# Patient Record
Sex: Female | Born: 1950 | Race: White | Hispanic: No | Smoking: Former smoker
Health system: Southern US, Community
[De-identification: ages and names within clinical notes are randomized; demographics above are authoritative.]

## PROBLEM LIST (undated history)

## (undated) DIAGNOSIS — R0600 Dyspnea, unspecified: Secondary | ICD-10-CM

## (undated) DIAGNOSIS — K219 Gastro-esophageal reflux disease without esophagitis: Secondary | ICD-10-CM

## (undated) DIAGNOSIS — E785 Hyperlipidemia, unspecified: Secondary | ICD-10-CM

## (undated) DIAGNOSIS — Z8619 Personal history of other infectious and parasitic diseases: Secondary | ICD-10-CM

## (undated) DIAGNOSIS — A630 Anogenital (venereal) warts: Secondary | ICD-10-CM

## (undated) DIAGNOSIS — E039 Hypothyroidism, unspecified: Secondary | ICD-10-CM

## (undated) DIAGNOSIS — F419 Anxiety disorder, unspecified: Secondary | ICD-10-CM

## (undated) DIAGNOSIS — R0609 Other forms of dyspnea: Secondary | ICD-10-CM

## (undated) DIAGNOSIS — Z87442 Personal history of urinary calculi: Secondary | ICD-10-CM

## (undated) DIAGNOSIS — J189 Pneumonia, unspecified organism: Secondary | ICD-10-CM

## (undated) DIAGNOSIS — F329 Major depressive disorder, single episode, unspecified: Secondary | ICD-10-CM

## (undated) DIAGNOSIS — F32A Depression, unspecified: Secondary | ICD-10-CM

## (undated) DIAGNOSIS — Z86010 Personal history of colonic polyps: Secondary | ICD-10-CM

## (undated) DIAGNOSIS — Z860101 Personal history of adenomatous and serrated colon polyps: Secondary | ICD-10-CM

## (undated) DIAGNOSIS — R911 Solitary pulmonary nodule: Secondary | ICD-10-CM

## (undated) DIAGNOSIS — R1310 Dysphagia, unspecified: Secondary | ICD-10-CM

## (undated) DIAGNOSIS — Z8659 Personal history of other mental and behavioral disorders: Secondary | ICD-10-CM

## (undated) DIAGNOSIS — M1990 Unspecified osteoarthritis, unspecified site: Secondary | ICD-10-CM

## (undated) DIAGNOSIS — J439 Emphysema, unspecified: Secondary | ICD-10-CM

## (undated) DIAGNOSIS — G4700 Insomnia, unspecified: Secondary | ICD-10-CM

## (undated) DIAGNOSIS — I10 Essential (primary) hypertension: Secondary | ICD-10-CM

## (undated) DIAGNOSIS — K449 Diaphragmatic hernia without obstruction or gangrene: Secondary | ICD-10-CM

## (undated) DIAGNOSIS — M5134 Other intervertebral disc degeneration, thoracic region: Secondary | ICD-10-CM

## (undated) DIAGNOSIS — Z9889 Other specified postprocedural states: Secondary | ICD-10-CM

## (undated) DIAGNOSIS — K7689 Other specified diseases of liver: Secondary | ICD-10-CM

## (undated) DIAGNOSIS — R112 Nausea with vomiting, unspecified: Secondary | ICD-10-CM

## (undated) DIAGNOSIS — F909 Attention-deficit hyperactivity disorder, unspecified type: Secondary | ICD-10-CM

## (undated) DIAGNOSIS — I480 Paroxysmal atrial fibrillation: Secondary | ICD-10-CM

## (undated) DIAGNOSIS — C801 Malignant (primary) neoplasm, unspecified: Secondary | ICD-10-CM

## (undated) DIAGNOSIS — D649 Anemia, unspecified: Secondary | ICD-10-CM

## (undated) DIAGNOSIS — D3500 Benign neoplasm of unspecified adrenal gland: Secondary | ICD-10-CM

## (undated) DIAGNOSIS — Z85118 Personal history of other malignant neoplasm of bronchus and lung: Secondary | ICD-10-CM

## (undated) HISTORY — DX: Anemia, unspecified: D64.9

## (undated) HISTORY — PX: COLONOSCOPY: SHX174

## (undated) HISTORY — DX: Dysphagia, unspecified: R13.10

## (undated) HISTORY — DX: Hyperlipidemia, unspecified: E78.5

---

## 1969-04-19 HISTORY — PX: TONSILLECTOMY: SUR1361

## 1976-04-19 HISTORY — PX: APPENDECTOMY: SHX54

## 1995-04-20 HISTORY — PX: CERVICAL FUSION: SHX112

## 1998-08-21 ENCOUNTER — Encounter: Admission: RE | Admit: 1998-08-21 | Discharge: 1998-08-21 | Payer: Self-pay | Admitting: Obstetrics & Gynecology

## 1999-11-19 ENCOUNTER — Other Ambulatory Visit: Admission: RE | Admit: 1999-11-19 | Discharge: 1999-11-19 | Payer: Self-pay | Admitting: Obstetrics

## 1999-11-30 ENCOUNTER — Encounter: Payer: Self-pay | Admitting: Urology

## 1999-11-30 ENCOUNTER — Encounter: Payer: Self-pay | Admitting: Obstetrics

## 1999-11-30 ENCOUNTER — Encounter: Admission: RE | Admit: 1999-11-30 | Discharge: 1999-11-30 | Payer: Self-pay | Admitting: Urology

## 1999-11-30 ENCOUNTER — Ambulatory Visit (HOSPITAL_COMMUNITY): Admission: RE | Admit: 1999-11-30 | Discharge: 1999-11-30 | Payer: Self-pay | Admitting: Obstetrics

## 2000-08-21 ENCOUNTER — Emergency Department (HOSPITAL_COMMUNITY): Admission: EM | Admit: 2000-08-21 | Discharge: 2000-08-21 | Payer: Self-pay | Admitting: Emergency Medicine

## 2000-08-21 ENCOUNTER — Encounter: Payer: Self-pay | Admitting: Emergency Medicine

## 2002-02-13 ENCOUNTER — Encounter: Payer: Self-pay | Admitting: Obstetrics

## 2002-02-13 ENCOUNTER — Ambulatory Visit (HOSPITAL_COMMUNITY): Admission: RE | Admit: 2002-02-13 | Discharge: 2002-02-13 | Payer: Self-pay | Admitting: Obstetrics

## 2002-03-23 ENCOUNTER — Encounter: Payer: Self-pay | Admitting: Cardiology

## 2002-03-23 ENCOUNTER — Ambulatory Visit (HOSPITAL_COMMUNITY): Admission: RE | Admit: 2002-03-23 | Discharge: 2002-03-23 | Payer: Self-pay | Admitting: Cardiology

## 2006-01-22 ENCOUNTER — Emergency Department (HOSPITAL_COMMUNITY): Admission: EM | Admit: 2006-01-22 | Discharge: 2006-01-22 | Payer: Self-pay | Admitting: Emergency Medicine

## 2006-09-09 ENCOUNTER — Encounter: Admission: RE | Admit: 2006-09-09 | Discharge: 2006-09-09 | Payer: Self-pay | Admitting: Family Medicine

## 2009-03-06 ENCOUNTER — Emergency Department (HOSPITAL_COMMUNITY): Admission: EM | Admit: 2009-03-06 | Discharge: 2009-03-06 | Payer: Self-pay | Admitting: Emergency Medicine

## 2009-04-04 ENCOUNTER — Emergency Department (HOSPITAL_COMMUNITY): Admission: EM | Admit: 2009-04-04 | Discharge: 2009-04-04 | Payer: Self-pay | Admitting: Emergency Medicine

## 2009-06-09 ENCOUNTER — Emergency Department (HOSPITAL_COMMUNITY): Admission: EM | Admit: 2009-06-09 | Discharge: 2009-06-09 | Payer: Self-pay | Admitting: Emergency Medicine

## 2011-04-14 ENCOUNTER — Emergency Department (HOSPITAL_COMMUNITY): Payer: Self-pay

## 2011-04-14 ENCOUNTER — Emergency Department (HOSPITAL_COMMUNITY)
Admission: EM | Admit: 2011-04-14 | Discharge: 2011-04-14 | Disposition: A | Discharge status: Home / Self Care | Payer: Self-pay | Attending: Emergency Medicine | Admitting: Emergency Medicine

## 2011-04-14 ENCOUNTER — Ambulatory Visit: Payer: Self-pay

## 2011-04-14 ENCOUNTER — Telehealth: Payer: Self-pay | Admitting: Pulmonary Disease

## 2011-04-14 ENCOUNTER — Encounter: Payer: Self-pay | Admitting: *Deleted

## 2011-04-14 DIAGNOSIS — R109 Unspecified abdominal pain: Secondary | ICD-10-CM | POA: Insufficient documentation

## 2011-04-14 DIAGNOSIS — D491 Neoplasm of unspecified behavior of respiratory system: Secondary | ICD-10-CM | POA: Insufficient documentation

## 2011-04-14 DIAGNOSIS — R1084 Generalized abdominal pain: Secondary | ICD-10-CM

## 2011-04-14 DIAGNOSIS — R10819 Abdominal tenderness, unspecified site: Secondary | ICD-10-CM | POA: Insufficient documentation

## 2011-04-14 DIAGNOSIS — R1110 Vomiting, unspecified: Secondary | ICD-10-CM | POA: Insufficient documentation

## 2011-04-14 DIAGNOSIS — R112 Nausea with vomiting, unspecified: Secondary | ICD-10-CM

## 2011-04-14 DIAGNOSIS — F17200 Nicotine dependence, unspecified, uncomplicated: Secondary | ICD-10-CM | POA: Insufficient documentation

## 2011-04-14 HISTORY — DX: Major depressive disorder, single episode, unspecified: F32.9

## 2011-04-14 HISTORY — DX: Depression, unspecified: F32.A

## 2011-04-14 HISTORY — DX: Insomnia, unspecified: G47.00

## 2011-04-14 LAB — CBC
HCT: 41.1 % (ref 36.0–46.0)
MCHC: 34.8 g/dL (ref 30.0–36.0)
MCV: 84.2 fL (ref 78.0–100.0)
Platelets: 315 10*3/uL (ref 150–400)
RDW: 14.2 % (ref 11.5–15.5)

## 2011-04-14 LAB — DIFFERENTIAL
Basophils Absolute: 0.1 10*3/uL (ref 0.0–0.1)
Basophils Relative: 1 % (ref 0–1)
Eosinophils Relative: 4 % (ref 0–5)
Monocytes Absolute: 0.5 10*3/uL (ref 0.1–1.0)
Neutro Abs: 5.9 10*3/uL (ref 1.7–7.7)

## 2011-04-14 LAB — COMPREHENSIVE METABOLIC PANEL
Albumin: 3.8 g/dL (ref 3.5–5.2)
Alkaline Phosphatase: 58 U/L (ref 39–117)
BUN: 14 mg/dL (ref 6–23)
CO2: 25 mEq/L (ref 19–32)
Chloride: 104 mEq/L (ref 96–112)
GFR calc non Af Amer: 89 mL/min — ABNORMAL LOW (ref >90)
Potassium: 4 mEq/L (ref 3.5–5.1)
Total Bilirubin: 0.3 mg/dL (ref 0.3–1.2)

## 2011-04-14 LAB — LIPASE, BLOOD: Lipase: 28 U/L (ref 11–59)

## 2011-04-14 MED ORDER — ONDANSETRON 8 MG PO TBDP: 8.0000 mg | ORAL_TABLET | Freq: Three times a day (TID) | ORAL | Status: AC | PRN

## 2011-04-14 MED ORDER — SODIUM CHLORIDE 0.9 % IV BOLUS (SEPSIS): 500.0000 mL | Freq: Once | INTRAVENOUS | Status: DC

## 2011-04-14 MED ORDER — ESOMEPRAZOLE MAGNESIUM 40 MG PO CPDR: 40.0000 mg | DELAYED_RELEASE_CAPSULE | Freq: Every day | ORAL | Status: DC

## 2011-04-14 MED ORDER — ONDANSETRON HCL 4 MG/2ML IJ SOLN
4.0000 mg | Freq: Once | INTRAMUSCULAR | Status: AC
  Administered 2011-04-14: 4 mg via INTRAVENOUS
  Filled 2011-04-14: qty 2

## 2011-04-14 MED ORDER — OXYCODONE-ACETAMINOPHEN 5-325 MG PO TABS: 1.0000 | ORAL_TABLET | ORAL | Status: AC | PRN

## 2011-04-14 MED ORDER — SODIUM CHLORIDE 0.9 % IV SOLN
INTRAVENOUS | Status: DC
  Administered 2011-04-14: 18:00:00 via INTRAVENOUS

## 2011-04-14 MED ORDER — IOHEXOL 300 MG/ML  SOLN
80.0000 mL | Freq: Once | INTRAMUSCULAR | Status: AC | PRN
  Administered 2011-04-14: 80 mL via INTRAVENOUS

## 2011-04-14 MED ORDER — OXYCODONE-ACETAMINOPHEN 5-325 MG PO TABS
1.0000 | ORAL_TABLET | Freq: Once | ORAL | Status: AC
  Administered 2011-04-14: 1 via ORAL
  Filled 2011-04-14: qty 1

## 2011-04-14 MED ORDER — FENTANYL CITRATE 0.05 MG/ML IJ SOLN
50.0000 ug | Freq: Once | INTRAMUSCULAR | Status: AC
  Administered 2011-04-14: 50 ug via INTRAVENOUS
  Filled 2011-04-14: qty 2

## 2011-04-14 NOTE — ED Notes (Signed)
Pt sent from urgent

## 2011-04-14 NOTE — Telephone Encounter (Signed)
60 yo female smoker seen in ED 12/26 for nausea and vomiting.  Had CT abd/pelvis with incidental finding of RLL 11 x 7 x 5 cm mass.  EDP felt patient did not need hospital admission, and requested that patient be contacted in AM of 12/27 to schedule pulmonary consultation with earliest available provider.  Patient was informed by EDP that she may have lung cancer.  Will forward message to triage to arrange for pulmonary consultation appointment.

## 2011-04-14 NOTE — ED Provider Notes (Signed)
History     CSN: [id number]  Arrival date & time 04/14/11  1553   First MD Initiated Contact with Patient 04/14/11 1628      Chief Complaint  Patient presents with  . Abdominal Pain    (Consider location/radiation/quality/duration/timing/severity/associated sxs/prior treatment) HPI Comments: She was seen in urgent care prior to coming here. CBC done. There is slightly elevated at 10.4. Urine dipstick was negative. They thought she might have a gallbladder problem, so sent her here for further evaluation.  Patient is a 60 y.o. female presenting with abdominal pain. The history is provided by the patient.  Abdominal Pain The primary symptoms of the illness include abdominal pain, nausea and vomiting (Started 5 days ago). The primary symptoms of the illness do not include fever, diarrhea or hematemesis. The current episode started more than 2 days ago. The onset of the illness was gradual (No vomiting since yesterday). The problem has been gradually improving.  The nausea is associated with eating.  The vomiting began more than 2 days ago. Vomiting occurs 6 to 10 times per day. Vomiting appearance: Thin, brown in color.  The patient states that she believes she is currently not pregnant. The patient has not had a change in bowel habit. Additional symptoms associated with the illness include back pain. Symptoms associated with the illness do not include chills, anorexia, urgency, hematuria or frequency. Associated symptoms comments: The emesis was the color of coffee. Significant associated medical issues do not include PUD, GERD, gallstones or liver disease. Associated medical issues comments: She has history of hiatal hernia.    Past Medical History  Diagnosis Date  . Insomnia   . Depression     Past Surgical History  Procedure Date  . Appendectomy   . Tonsillectomy   . Cervical fusion     No family history on file.  History  Substance Use Topics  . Smoking status: Current  Everyday Smoker -- 0.5 packs/day  . Smokeless tobacco: Not on file  . Alcohol Use: No    OB History    Grav Para Term Preterm Abortions TAB SAB Ect Mult Living                  Review of Systems  Constitutional: Negative for fever and chills.  Gastrointestinal: Positive for nausea, vomiting (Started 5 days ago) and abdominal pain. Negative for diarrhea, anorexia and hematemesis.  Genitourinary: Negative for urgency, frequency and hematuria.  Musculoskeletal: Positive for back pain.  All other systems reviewed and are negative.    Allergies  Sulfa antibiotics  Home Medications   Current Outpatient Rx  Name Route Sig Dispense Refill  . DULOXETINE HCL 30 MG PO CPEP Oral Take 60 mg by mouth daily.      Marland Kitchen ZOLPIDEM TARTRATE 10 MG PO TABS Oral Take 10 mg by mouth at bedtime as needed. For sleep.     Marland Kitchen ESOMEPRAZOLE MAGNESIUM 40 MG PO CPDR Oral Take 1 capsule (40 mg total) by mouth daily. 30 capsule 0  . ONDANSETRON 8 MG PO TBDP Oral Take 1 tablet (8 mg total) by mouth every 8 (eight) hours as needed for nausea. 20 tablet 0  . OXYCODONE-ACETAMINOPHEN 5-325 MG PO TABS Oral Take 1 tablet by mouth every 4 (four) hours as needed for pain. 30 tablet 0    BP 142/117  Pulse 100  Temp(Src) 98 F (36.7 C) (Oral)  Resp 20  Wt 162 lb (73.483 kg)  SpO2 100%  Physical Exam  Nursing note  and vitals reviewed. Constitutional: She is oriented to person, place, and time. She appears well-developed and well-nourished.  HENT:  Head: Normocephalic and atraumatic.  Eyes: Conjunctivae and EOM are normal. Pupils are equal, round, and reactive to light.  Neck: Normal range of motion and phonation normal. Neck supple.  Cardiovascular: Normal rate, regular rhythm and intact distal pulses.   Pulmonary/Chest: Effort normal and breath sounds normal. She exhibits no tenderness.  Abdominal: Soft. Bowel sounds are normal. She exhibits no distension and no mass. There is tenderness (she has mild, diffuse  abdominal tenderness). There is no rebound and no guarding.  Genitourinary:       She has left costovertebral angle tenderness  Musculoskeletal: Normal range of motion.       Shows moderate bilateral lumbar tenderness.  Neurological: She is alert and oriented to person, place, and time. She has normal strength and normal reflexes. She exhibits normal muscle tone.  Skin: Skin is warm and dry.  Psychiatric: Her behavior is normal. Judgment and thought content normal.       She is anxious    ED Course  Procedures (including critical care time) 19:33- EKG findings discussed with patient, after conferring with the radiologist, and pulmonary critical care doctors. The patient feels better after treatment in ED with IV fluids, antiemetics, and analgesia. She prefers to be discharged and have outpatient followup for the lung tumor. She understands that it is likely a cancer. Labs Reviewed  COMPREHENSIVE METABOLIC PANEL - Abnormal; Notable for the following:    GFR calc non Af Amer 89 (*)    All other components within normal limits  LIPASE, BLOOD  CBC  DIFFERENTIAL  URINE CULTURE   Ct Abdomen Pelvis W Contrast  04/14/2011  *RADIOLOGY REPORT*  Clinical Data: Mid abdominal pain, prior appendectomy  CT ABDOMEN AND PELVIS WITH CONTRAST  Technique:  Multidetector CT imaging of the abdomen and pelvis was performed following the standard protocol during bolus administration of intravenous contrast.  Contrast: 80mL OMNIPAQUE IOHEXOL 300 MG/ML IV SOLN  Comparison: None.  Findings: 11.3 x 7.7 x 5.3 cm heterogeneous, partially calcified right lower lobe pulmonary mass (series 2/image 1).  Small hiatal hernia.  Liver is notable for an 8 mm right lower pole cyst (series 2/image 26).  Spleen, pancreas, and right adrenal gland are within normal limits.  1.7 x 1.4 cm left adrenal nodule which measures 3 HUs on delayed imaging, compatible with an adrenal adenoma.  8 mm right upper pole cyst (series 2/image 26).  2.0  cm left renal cyst (series 2/image 32).  Mild left upper pole cortical scarring. No hydronephrosis.  No evidence of bowel obstruction.  Mild colonic diverticulosis, without associated inflammatory changes.  Atherosclerotic calcifications of the abdominal aorta and branch vessels.  No abdominopelvic ascites.  No suspicious abdominopelvic lymphadenopathy.  Uterus is mildly heterogeneous, possibly reflecting uterine fibroids.  Ovaries are unremarkable.  Bladder is within normal limits.  Mild degenerative changes of the visualized thoracolumbar spine.  IMPRESSION: 11.3 x 7.7 x 5.3 cm right lower lobe pulmonary mass.  1.7 cm left adrenal adenoma.  Additional ancillary findings as above.  Critical Value/emergent results were called by telephone at the time of interpretation on 04/14/2011  at 1905 hours  to  Dr. Mancel Bale, who verbally acknowledged these results.  Original Report Authenticated By: Charline Bills, M.D.     1. Abdominal  pain, other specified site   2. Vomiting   3. Lung tumor       MDM  Nonspecific abdominal pain with vomiting. 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 Patient and her sister were informed of the findings and understands implications. Relative to the abdominal pain and vomiting; there is no clear source found. She may have an element of peptic ulcer disease, and that can be treated symptomatically.   Plan: Prescriptions Percocet, Zofran, Nexium. Followup with pulmonary as soon as possible. Also, recommend followup with a primary care doctor.        Flint Melter, MD 04/14/11 [phone]

## 2011-04-14 NOTE — ED Notes (Signed)
Patient aware of need for urine specimen. Patient unable to void at this time.

## 2011-04-14 NOTE — ED Notes (Signed)
Pt states "seen @ UC & was sent here thinking it's my gallbladder, have been vomiting, have pain all through abd"

## 2011-04-14 NOTE — ED Notes (Signed)
Pt reports nausea since Sun am, worsening every day, vomited x 2 today. Denies fever. States UC did UA today and it was ok and told her to go to ER. No SOB, no diarrhea.

## 2011-04-15 NOTE — Telephone Encounter (Signed)
Called and spoke with pt and appt has been made for her on 1-15 with RB at 3pm.  Pt is aware of appt and will bring all meds and medical info with her to this appt.  Pt is aware of location of office and to arrive 15 mins prior to scheduled appt.  Pt may call to see if we have any earlier appts.

## 2011-04-20 DIAGNOSIS — C801 Malignant (primary) neoplasm, unspecified: Secondary | ICD-10-CM

## 2011-04-20 HISTORY — DX: Malignant (primary) neoplasm, unspecified: C80.1

## 2011-04-23 ENCOUNTER — Telehealth: Payer: Self-pay | Admitting: Internal Medicine

## 2011-04-23 NOTE — Telephone Encounter (Signed)
Sister calling back.  She states she really needs some advise about patient before the weekend.

## 2011-04-23 NOTE — Telephone Encounter (Signed)
Spoke with pt's sister. Pt scheduled to see RB for consult for lung mass on 05/04/11. She states that the pt was vomiting this am and wanted to know what she should do about this. I advised that she should contact PCP. She states that the pt does not have one. I advised that pt should increase fluids, bland diet and seek emergency care asap if she get worse or if the symptoms persist. She verbalized understanding and states nothing further needed.

## 2011-05-04 ENCOUNTER — Encounter: Payer: Self-pay | Admitting: Emergency Medicine

## 2011-05-04 ENCOUNTER — Ambulatory Visit (INDEPENDENT_AMBULATORY_CARE_PROVIDER_SITE_OTHER): Payer: Self-pay | Admitting: Emergency Medicine

## 2011-05-04 VITALS — BP 120/80 | HR 82 | Temp 98.2°F | Ht 66.0 in | Wt 160.0 lb

## 2011-05-04 DIAGNOSIS — R222 Localized swelling, mass and lump, trunk: Secondary | ICD-10-CM

## 2011-05-04 MED ORDER — OXYCODONE-ACETAMINOPHEN 5-325 MG PO TABS: 1.0000 | ORAL_TABLET | Freq: Four times a day (QID) | ORAL | Status: DC | PRN

## 2011-05-04 NOTE — Patient Instructions (Addendum)
We will perform a CT scan of the chest We will set up a bronchoscopy with biopsies Follow-up with Dr. Delton Coombes in 1 month.

## 2011-05-04 NOTE — Progress Notes (Signed)
Subjective:    Patient ID: Andrea Morgan, female    DOB: 01/15/1951, 60 y.o.   MRN: [id number]  HPI 60 yo smoker, hx allergies, was seen 12/26 in urgent care for abd pain and nausea/vomiting, pain when taking PO. CT scan abd was performed that showed an large mass that appears to be in RLL, ? With some heterogeneity and possibly some contrast uptake vs calcification. Was seen in ED by Dr Wert and informed about the CT findings. Referred here now for biopsy and further eval.    Review of Systems  Constitutional: Positive for fatigue. Negative for fever and unexpected weight change.  HENT: Positive for trouble swallowing. Negative for ear pain, nosebleeds, congestion, sore throat, rhinorrhea, sneezing, dental problem, postnasal drip and sinus pressure.   Eyes: Negative.  Negative for redness and itching.  Respiratory: Positive for shortness of breath. Negative for cough, chest tightness and wheezing.   Cardiovascular: Negative.  Negative for palpitations and leg swelling.  Gastrointestinal: Positive for abdominal pain. Negative for nausea and vomiting.  Genitourinary: Negative.  Negative for dysuria.  Musculoskeletal: Negative.  Negative for joint swelling.  Skin: Negative.  Negative for rash.  Neurological: Positive for headaches.  Hematological: Negative.  Does not bruise/bleed easily.  Psychiatric/Behavioral: Negative for dysphoric mood. The patient is nervous/anxious.    Past Medical History  Diagnosis Date  . Insomnia   . Depression   . Chronic headaches   . Allergic rhinitis   . Depression      Family History  Problem Relation Age of Onset  . Asthma Mother   . Asthma Maternal Grandmother   . Heart disease Mother   . Lung cancer Father   . Ovarian cancer Maternal Aunt   . Breast cancer Maternal Aunt     x2  . Prostate cancer Maternal Uncle   . Kidney cancer Sister      History   Social History  . Marital Status: Divorced    Spouse Name: N/A    Number of  Children: N/A  . Years of Education: N/A   Occupational History  . 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 Smoking status: Former Smoker -- 1.0 packs/day for 45 years    Types: Cigarettes    Quit date: 04/14/2011  . Smokeless tobacco: Not on file  . Alcohol Use: No  . Drug Use: No  . Sexually Active: Not on file   Other Topics Concern  . Not on file   Social History Narrative  . No narrative on file     Allergies  Allergen Reactions  . Sulfa Antibiotics Hives     Outpatient Prescriptions Prior to Visit  Medication Sig Dispense Refill  . DULoxetine (CYMBALTA) 30 MG capsule Take 60 mg by mouth daily.        . esomeprazole (NEXIUM) 40 MG capsule Take 1 capsule (40 mg total) by mouth daily.  30 capsule  0  . zolpidem (AMBIEN) 10 MG tablet Take 10 mg by mouth at bedtime as needed. For sleep.            Objective:   Physical Exam  Gen: Pleasant, well-nourished, in no distress,  normal affect  ENT: No lesions,  mouth clear,  oropharynx clear, no postnasal drip  Neck: No JVD, no TMG, no carotid bruits  Lungs: No use of accessory muscles, no dullness to percussion, clear without rales or rhonchi  Cardiovascular: RRR, heart sounds normal, no murmur or gallops, no peripheral edema    Musculoskeletal: No deformities, no cyanosis or clubbing  Neuro: alert, non focal  Skin: Warm, no lesions or rashes      Assessment & Plan:  Chest mass Large RLL mass on CT scan abdomen 12/26.  - CT scan chest asap - set up FOB with bx's next week, if I am unable to reach it then will set up Ct guided needle or surgical bx.      

## 2011-05-04 NOTE — Assessment & Plan Note (Signed)
Large RLL mass on CT scan abdomen 12/26.  - CT scan chest asap - set up FOB with bx's next week, if I am unable to reach it then will set up Ct guided needle or surgical bx.

## 2011-05-06 ENCOUNTER — Ambulatory Visit (INDEPENDENT_AMBULATORY_CARE_PROVIDER_SITE_OTHER)
Admission: RE | Admit: 2011-05-06 | Discharge: 2011-05-06 | Disposition: A | Discharge status: Home / Self Care | Payer: Self-pay | Source: Ambulatory Visit | Attending: Emergency Medicine | Admitting: Emergency Medicine

## 2011-05-06 ENCOUNTER — Encounter (HOSPITAL_COMMUNITY): Payer: Self-pay | Admitting: Pharmacy Technician

## 2011-05-06 ENCOUNTER — Telehealth: Payer: Self-pay | Admitting: Emergency Medicine

## 2011-05-06 DIAGNOSIS — R222 Localized swelling, mass and lump, trunk: Secondary | ICD-10-CM

## 2011-05-06 MED ORDER — IOHEXOL 300 MG/ML  SOLN
80.0000 mL | Freq: Once | INTRAMUSCULAR | Status: AC | PRN
  Administered 2011-05-06: 80 mL via INTRAVENOUS

## 2011-05-06 NOTE — Telephone Encounter (Signed)
LMOMTCB x 1 

## 2011-05-06 NOTE — Telephone Encounter (Signed)
Pt's sister [name] returned call. I advised her per megan that nurse will call her back in a few minutes. [phone]. Andrea Morgan

## 2011-05-06 NOTE — Telephone Encounter (Signed)
Called and spoke with pt's sister [name].  [name] states pt got home and read over the bronch brochure and was concerned because it didn't mention anything about doing a tissue sample of the mass in her chest.  Pt concerned that RB isn't going to do a tissue sample and then she will have to have more tests/procedures done.  [name] states she was with pt for her appt with RB and took notes and noted that RB did state he would take tissue sample during bronch.  [name] just wanted to call today to verify this and to help reassure patient.  Informed [name] that yes, RB will get tissue samples of mass  during her bronchoscopy.  [name] verbalized understanding and nothing further needed and would relay message to pt.

## 2011-05-10 ENCOUNTER — Encounter (HOSPITAL_COMMUNITY): Payer: Self-pay

## 2011-05-11 ENCOUNTER — Other Ambulatory Visit: Payer: Self-pay | Admitting: Emergency Medicine

## 2011-05-11 ENCOUNTER — Ambulatory Visit (HOSPITAL_COMMUNITY): Payer: Self-pay

## 2011-05-11 ENCOUNTER — Ambulatory Visit (HOSPITAL_COMMUNITY)
Admission: RE | Admit: 2011-05-11 | Discharge: 2011-05-11 | Disposition: A | Discharge status: Home / Self Care | Payer: Self-pay | Source: Ambulatory Visit | Attending: Emergency Medicine | Admitting: Emergency Medicine

## 2011-05-11 ENCOUNTER — Encounter (HOSPITAL_COMMUNITY)
Admission: RE | Disposition: A | Discharge status: Home / Self Care | Payer: Self-pay | Source: Ambulatory Visit | Attending: Emergency Medicine

## 2011-05-11 DIAGNOSIS — R222 Localized swelling, mass and lump, trunk: Secondary | ICD-10-CM | POA: Insufficient documentation

## 2011-05-11 DIAGNOSIS — Z87891 Personal history of nicotine dependence: Secondary | ICD-10-CM | POA: Insufficient documentation

## 2011-05-11 HISTORY — PX: VIDEO BRONCHOSCOPY: SHX5072

## 2011-05-11 LAB — CULTURE, BAL-QUANTITATIVE W GRAM STAIN
Colony Count: NO GROWTH
Culture: NO GROWTH

## 2011-05-11 SURGERY — BRONCHOSCOPY, WITH FLUOROSCOPY
Anesthesia: Moderate Sedation | Laterality: Bilateral

## 2011-05-11 MED ORDER — FENTANYL CITRATE 0.05 MG/ML IJ SOLN
INTRAMUSCULAR | Status: AC
  Filled 2011-05-11: qty 4

## 2011-05-11 MED ORDER — MIDAZOLAM HCL 10 MG/2ML IJ SOLN
INTRAMUSCULAR | Status: AC
  Filled 2011-05-11: qty 4

## 2011-05-11 MED ORDER — LIDOCAINE HCL 1 % IJ SOLN
6.0000 mL | Freq: Once | INTRAMUSCULAR | Status: AC
  Administered 2011-05-11: 6 mL via RESPIRATORY_TRACT

## 2011-05-11 MED ORDER — PHENYLEPHRINE HCL 0.25 % NA SOLN
1.0000 | Freq: Four times a day (QID) | NASAL | Status: DC | PRN
  Administered 2011-05-11: 1 via NASAL
  Filled 2011-05-11: qty 15

## 2011-05-11 MED ORDER — SODIUM CHLORIDE 0.9 % IV SOLN
INTRAVENOUS | Status: DC
  Administered 2011-05-11: 08:00:00 via INTRAVENOUS

## 2011-05-11 MED ORDER — FENTANYL CITRATE 0.05 MG/ML IJ SOLN
INTRAMUSCULAR | Status: DC | PRN
  Administered 2011-05-11: 50 ug via INTRAVENOUS
  Administered 2011-05-11 (×2): 25 ug via INTRAVENOUS

## 2011-05-11 MED ORDER — MIDAZOLAM HCL 10 MG/2ML IJ SOLN
INTRAMUSCULAR | Status: DC | PRN
  Administered 2011-05-11 (×2): 2 mg via INTRAVENOUS
  Administered 2011-05-11: 1 mg via INTRAVENOUS
  Administered 2011-05-11: 2 mg via INTRAVENOUS
  Administered 2011-05-11: 1 mg via INTRAVENOUS

## 2011-05-11 MED ORDER — LIDOCAINE HCL 2 % EX GEL
Freq: Once | CUTANEOUS | Status: AC
  Administered 2011-05-11: 07:00:00 via TOPICAL

## 2011-05-11 NOTE — Interval H&P Note (Signed)
Interval hx: No clinical changes or reason not to proceed. All VS and data reviewed. Plan: FOB with TBBx under fluoroscopy  Levy Pupa, MD, PhD 05/11/2011, 8:16 AM [facility] [phone] or if no answer [phone]

## 2011-05-11 NOTE — Progress Notes (Signed)
This note also relates to the following rows which could not be included: Temp - Cannot attach notes to unvalidated device data Tamponade x 2 mins for bleeding

## 2011-05-11 NOTE — H&P (View-Only) (Signed)
Subjective:    Patient ID: Andrea Morgan, female    DOB: 1950/10/12, 61 y.o.   MRN: [id number]  HPI 61 yo smoker, hx allergies, was seen 12/26 in urgent care for abd pain and nausea/vomiting, pain when taking PO. CT scan abd was performed that showed an large mass that appears to be in RLL, ? With some heterogeneity and possibly some contrast uptake vs calcification. Was seen in ED by Dr Sherene Sires and informed about the CT findings. Referred here now for biopsy and further eval.    Review of Systems  Constitutional: Positive for fatigue. Negative for fever and unexpected weight change.  HENT: Positive for trouble swallowing. Negative for ear pain, nosebleeds, congestion, sore throat, rhinorrhea, sneezing, dental problem, postnasal drip and sinus pressure.   Eyes: Negative.  Negative for redness and itching.  Respiratory: Positive for shortness of breath. Negative for cough, chest tightness and wheezing.   Cardiovascular: Negative.  Negative for palpitations and leg swelling.  Gastrointestinal: Positive for abdominal pain. Negative for nausea and vomiting.  Genitourinary: Negative.  Negative for dysuria.  Musculoskeletal: Negative.  Negative for joint swelling.  Skin: Negative.  Negative for rash.  Neurological: Positive for headaches.  Hematological: Negative.  Does not bruise/bleed easily.  Psychiatric/Behavioral: Negative for dysphoric mood. The patient is nervous/anxious.    Past Medical History  Diagnosis Date  . Insomnia   . Depression   . Chronic headaches   . Allergic rhinitis   . Depression      Family History  Problem Relation Age of Onset  . Asthma Mother   . Asthma Maternal Grandmother   . Heart disease Mother   . Lung cancer Father   . Ovarian cancer Maternal Aunt   . Breast cancer Maternal Aunt     x2  . Prostate cancer Maternal Uncle   . Kidney cancer Sister      History   Social History  . Marital Status: Divorced    Spouse Name: N/A    Number of  Children: N/A  . 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 Smoking status: Former Smoker -- 1.0 packs/day for 45 years    Types: Cigarettes    Quit date: 04/14/2011  . Smokeless tobacco: Not on file  . Alcohol Use: No  . Drug Use: No  . Sexually Active: Not on file   Other Topics Concern  . Not on file   Social History Narrative  . No narrative on file     Allergies  Allergen Reactions  . Sulfa Antibiotics Hives     Outpatient Prescriptions Prior to Visit  Medication Sig Dispense Refill  . DULoxetine (CYMBALTA) 30 MG capsule Take 60 mg by mouth daily.        Marland Kitchen esomeprazole (NEXIUM) 40 MG capsule Take 1 capsule (40 mg total) by mouth daily.  30 capsule  0  . zolpidem (AMBIEN) 10 MG tablet Take 10 mg by mouth at bedtime as needed. For sleep.            Objective:   Physical Exam  Gen: Pleasant, well-nourished, in no distress,  normal affect  ENT: No lesions,  mouth clear,  oropharynx clear, no postnasal drip  Neck: No JVD, no TMG, no carotid bruits  Lungs: No use of accessory muscles, no dullness to percussion, clear without rales or rhonchi  Cardiovascular: RRR, heart sounds normal, no murmur or gallops, no peripheral edema  Musculoskeletal: No deformities, no cyanosis or clubbing  Neuro: alert, non focal  Skin: Warm, no lesions or rashes      Assessment & Plan:  Chest mass Large RLL mass on CT scan abdomen 12/26.  - CT scan chest asap - set up FOB with bx's next week, if I am unable to reach it then will set up Ct guided needle or surgical bx.

## 2011-05-11 NOTE — Progress Notes (Signed)
This note also relates to the following rows which could not be included: Temp - Cannot attach notes to unvalidated device data Tamponade for bleeding x 2 mins

## 2011-05-11 NOTE — Progress Notes (Addendum)
Received report from radiology re: portable dg chest xray 1 view (post-procedure today).  Notified Dr. Delton Coombes of report.  Per DR. Byrum, pt can be d/c'ed to home.

## 2011-05-11 NOTE — Brief Op Note (Signed)
Video Bronchoscopy Procedure Note  Date of Operation: 05/11/2011  Pre-op Diagnosis: R basilar lung mass   Post-op Diagnosis: R basilar lung mass  Surgeon: Levy Pupa  Assistants: none  Anesthesia: conscious sedation, moderate sedation  Meds Given: fentanyl , versed 8mg  in divided doses, 1% lidocaine 25cc total  Operation: Flexible video fiberoptic bronchoscopy and biopsies.  Estimated Blood Loss: 15cc  Complications: none noted  Indications and History: Andrea Morgan is 61yo female, seen in consultation for a newly discovered R basilar lung mass that appears to involve both the RML and RLL.  The risks, benefits, complications, treatment options and expected outcomes were discussed with the patient.  The possibilities of pneumothorax, pneumonia, reaction to medication, pulmonary aspiration, perforation of a viscus, bleeding, failure to diagnose a condition and creating a complication requiring transfusion or operation were discussed with the patient who freely signed the consent.    Description of Procedure: The patient was seen in the Preoperative Area, was examined and was deemed appropriate to proceed.  The patient was taken to cardiopulmonary room at [facility], identified as Andrea Morgan and the procedure verified as Flexible Video Fiberoptic Bronchoscopy.  A Time Out was held and the above information confirmed.   Conscious sedation was initiated as indicated above. The video fiberoptic bronchoscope was introduced via the L nare and a general inspection was performed which showed normal cords, normal trachea, normal main carina. The R sided airways were inspected and showed normal RUL, BI, slightly narrowed RML and patent  RLL. No endobronchial lesions or abnormal secretions were seen. The L side was then inspected. The LLL, LUL and Lingular airways were normal.  Under fluoroscopic guidance, Wang needle bx were performed on the R basilar mass using both a RLL and RML approach.  Likewise, transbronchial bronchial brushings were take using the RLL approach. Then fluoroscopy was used to perform transbronchial forceps biopsies via the RLL and the RML. There was some initial moderate bleeding after the transbronchial biopsies that stopped quickly with tamponade. Finally endobronchial washings were performed in the RLL to be sent for cytology. The patient tolerated the procedure well. The bronchoscope was removed. There were no obvious complications. A post-procedural CXR is pending.   Samples: 1. Transbronchial Wang needle biopsies from RLL 2. Transbronchial Wang needle biopsies from RML 3. Transbronchial brushings from RLL 4. Transbronchial forceps biopsies from RLL 5. Transbronchial forceps biopsies from RML 6. BAL from RLL   Plans:  We will review the cytology, pathology and microbiology results with the patient when they become available. The patient will be called with this information. They have also asked that we contact his daughter [name] his HCPOA at [phone]. Outpatient followup will be with  Levy Pupa, MD, PhD 05/11/2011, 9:31 AM [facility] [phone] or if no answer [phone]

## 2011-05-12 ENCOUNTER — Encounter (HOSPITAL_COMMUNITY): Payer: Self-pay

## 2011-05-13 ENCOUNTER — Telehealth: Payer: Self-pay | Admitting: Emergency Medicine

## 2011-05-13 NOTE — Telephone Encounter (Signed)
Reviewed the path results with pt by phone. Both the RLL and RML biopsies were reported as negative - no evidence malignancy. I will review the films with [facility], consider alternative biopsy.

## 2011-05-14 NOTE — Telephone Encounter (Signed)
Dr byrum, pt wants to speak to you again (even though you spoke to her yesterday). She has a few more questions/ concerns re: what you told her. Call her home #. Andrea Morgan

## 2011-05-14 NOTE — Telephone Encounter (Signed)
Spoke to her today. I plan to review her case with [facility] to consider bx via another route. She will also need a GI referral. I will go ahead and order this.

## 2011-05-17 ENCOUNTER — Telehealth: Payer: Self-pay | Admitting: Emergency Medicine

## 2011-05-17 DIAGNOSIS — R222 Localized swelling, mass and lump, trunk: Secondary | ICD-10-CM

## 2011-05-17 NOTE — Telephone Encounter (Signed)
Misty Stanley from [employer] calling back for [facility] & asked to be reached tomorrow morning around 8:30 a.m.  Antionette Fairy

## 2011-05-17 NOTE — Telephone Encounter (Signed)
Spoke with pt's sister. She states has several concerns regarding the pt. She states that pt "had a meltdown" over the w/e where she would not speak to anyone and "just is not being her self"- she states that she is concerned that pt is going to become dehydrated b/c she is unable to swallow properly and is not drinking enough. She states that she prefers that the pt be referred to Dr. Loreta Ave for GI needs. She wants to have that pt admitted if and when she has her needle bx done to ensure that she stays stable. Please advise thanks!

## 2011-05-17 NOTE — Telephone Encounter (Signed)
Spoke to pt sister and was given an appt to see [email or web address] 05/19/11@1 :30pm

## 2011-05-17 NOTE — Telephone Encounter (Signed)
Pt's sister Kem Parkinson returned call from [facility] re: referral to GI. [phone]. Hazel Sams

## 2011-05-18 ENCOUNTER — Telehealth: Payer: Self-pay | Admitting: Emergency Medicine

## 2011-05-18 NOTE — Telephone Encounter (Signed)
Reviewed case with the patient's sister. I have discussed the CT scan with Dr. Edwyna Shell with thoracic surgery, he agrees that this could be responsible for the patient's dysphasia although not necessarily. He further agrees that the lesion needs to be resected. Per our discussion I will order pulmonary function testing, a PET scan and will refer the patient to [facility]. Arrangements have already been made for her to see Dr. Elnoria Howard with gastroenterology tomorrow.

## 2011-05-18 NOTE — Telephone Encounter (Signed)
Spoke to [facility], records sent there for dr hung to review for appt 05/19/11

## 2011-05-19 ENCOUNTER — Telehealth: Payer: Self-pay | Admitting: Emergency Medicine

## 2011-05-19 ENCOUNTER — Encounter (HOSPITAL_COMMUNITY): Payer: Self-pay | Admitting: Emergency Medicine

## 2011-05-19 NOTE — Telephone Encounter (Signed)
Spoke to fran she is also aware of pt's appt

## 2011-05-19 NOTE — Telephone Encounter (Signed)
Per [facility] she let Dr. Elnoria Howard know rb was not here to day but will be tomorrow. Please advise Dr. Delton Coombes, thanks

## 2011-05-20 NOTE — Telephone Encounter (Signed)
Spoke to Dr Elnoria Howard today

## 2011-05-21 ENCOUNTER — Encounter (HOSPITAL_COMMUNITY): Payer: Self-pay

## 2011-05-21 ENCOUNTER — Ambulatory Visit (HOSPITAL_COMMUNITY)
Admission: RE | Admit: 2011-05-21 | Discharge: 2011-05-21 | Disposition: A | Discharge status: Home / Self Care | Payer: Self-pay | Source: Ambulatory Visit | Attending: Gastroenterology | Admitting: Gastroenterology

## 2011-05-21 ENCOUNTER — Encounter (HOSPITAL_COMMUNITY)
Admission: RE | Disposition: A | Discharge status: Home / Self Care | Payer: Self-pay | Source: Ambulatory Visit | Attending: Gastroenterology

## 2011-05-21 DIAGNOSIS — K449 Diaphragmatic hernia without obstruction or gangrene: Secondary | ICD-10-CM | POA: Insufficient documentation

## 2011-05-21 DIAGNOSIS — R1310 Dysphagia, unspecified: Secondary | ICD-10-CM | POA: Insufficient documentation

## 2011-05-21 DIAGNOSIS — K209 Esophagitis, unspecified without bleeding: Secondary | ICD-10-CM | POA: Insufficient documentation

## 2011-05-21 DIAGNOSIS — R222 Localized swelling, mass and lump, trunk: Secondary | ICD-10-CM | POA: Insufficient documentation

## 2011-05-21 HISTORY — PX: ESOPHAGOGASTRODUODENOSCOPY: SHX5428

## 2011-05-21 SURGERY — EGD (ESOPHAGOGASTRODUODENOSCOPY)
Anesthesia: Moderate Sedation

## 2011-05-21 MED ORDER — DIPHENHYDRAMINE HCL 50 MG/ML IJ SOLN
INTRAMUSCULAR | Status: AC
  Filled 2011-05-21: qty 1

## 2011-05-21 MED ORDER — SODIUM CHLORIDE 0.9 % IV SOLN
Freq: Once | INTRAVENOUS | Status: AC
  Administered 2011-05-21: 11:00:00 via INTRAVENOUS

## 2011-05-21 MED ORDER — FENTANYL CITRATE 0.05 MG/ML IJ SOLN
INTRAMUSCULAR | Status: AC
  Filled 2011-05-21: qty 4

## 2011-05-21 MED ORDER — MIDAZOLAM HCL 10 MG/2ML IJ SOLN
INTRAMUSCULAR | Status: AC
  Filled 2011-05-21: qty 4

## 2011-05-21 MED ORDER — OMEPRAZOLE 40 MG PO CPDR: 40.0000 mg | DELAYED_RELEASE_CAPSULE | Freq: Two times a day (BID) | ORAL | Status: DC

## 2011-05-21 MED ORDER — FENTANYL NICU IV SYRINGE 50 MCG/ML
INJECTION | INTRAMUSCULAR | Status: DC | PRN
  Administered 2011-05-21 (×3): 25 ug via INTRAVENOUS

## 2011-05-21 MED ORDER — MIDAZOLAM HCL 10 MG/2ML IJ SOLN
INTRAMUSCULAR | Status: DC | PRN
  Administered 2011-05-21: 2 mg via INTRAVENOUS
  Administered 2011-05-21: 1 mg via INTRAVENOUS
  Administered 2011-05-21 (×2): 2 mg via INTRAVENOUS

## 2011-05-21 NOTE — H&P (Signed)
  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  As a result of her dysphagia she reports a 10 lbs weight loss.  She is currently undergoing evaluation of her RLL mass and it may be a pleural based lesion.  Because of her dysphagia a GI consultation was requested and she is now here to undergo an EGD.  Past Medical History  Diagnosis Date  . Insomnia   . Chronic headaches   . Allergic rhinitis   . Depression   . Depression     Past Surgical History  Procedure Date  . Appendectomy 1978  . Tonsillectomy 1971  . Cervical fusion 1997  . Video bronchoscopy 05/11/2011    Procedure: VIDEO BRONCHOSCOPY WITH FLUORO;  Surgeon: [name], MD;  Location: [facility];  Service: Cardiopulmonary;  Laterality: Bilateral;  . Cervical fusion     Family History  Problem Relation Age of Onset  . Asthma Mother   . Asthma Maternal Grandmother   . Heart disease Mother   . Lung cancer Father   . Ovarian cancer Maternal Aunt   . Breast cancer Maternal Aunt     x2  . Prostate cancer Maternal Uncle   . Kidney cancer Sister     Social History:  reports that she quit smoking about 5 weeks ago. Her smoking use included Cigarettes. She has a 45 pack-year smoking history. She does not have any smokeless tobacco history on file. She reports that she does not drink alcohol or use illicit drugs.  Allergies:  Allergies  Allergen Reactions  . Sulfa Antibiotics Hives    Medications:  Scheduled:   . sodium chloride   Intravenous Once   Continuous:   No results found for this or any previous visit (from the past 24 hour(s)).   No results found.  ROS:  As stated above in the HPI otherwise negative.  Blood pressure 106/76, pulse 68, temperature 98 F (36.7 C), temperature source Oral, resp. rate 17,  SpO2 96.00%.    PE: Gen: NAD, Alert and Oriented HEENT:  [place]/AT, EOMI Neck: Supple, no LAD Lungs: CTA Bilaterally CV: RRR without M/G/R ABM: Soft, NTND, +BS Ext: No C/C/E  Assessment/Plan: 1) Dysphagia 2) RLL mass   I discussed the case with Dr. Delton Coombes.  She may have a pleural-based lesion.  My feeling is that she may have extrinsic compression of the the esophagus from the mass, but further evaluation will be performed with an EGD.  Plan: 1) EGD today.  Curties Conigliaro D 05/21/2011, 11:21 AM

## 2011-05-21 NOTE — Op Note (Signed)
[facility] [address] [place], [place]  [address]  OPERATIVE PROCEDURE REPORT  PATIENT:  Andrea Morgan, Andrea Morgan  MR#:  [id number] BIRTHDATE:  05-May-1950  GENDER:  female ENDOSCOPIST:  Jeani Hawking, MD ASSISTANT:  Amie Critchley, Technician PROCEDURE DATE:  05/21/2011 PROCEDURE:  EGD, diagnostic [id number] ASA CLASS:  Class III INDICATIONS:  Dysphagia MEDICATIONS:  Fentanyl 75 mcg IV, Versed 7 mg IV TOPICAL ANESTHETIC:  Cetacaine Spray  DESCRIPTION OF PROCEDURE:   After the risks benefits and alternatives of the procedure were thoroughly explained, informed consent was obtained.  The EG-[id number] ([id number]) endoscope was introduced through the mouth and advanced to the second portion of the duodenum, without limitations.  The instrument was slowly withdrawn as the mucosa was fully examined. <<PROCEDUREIMAGES>>  FINDINGS:  An LA Grade D esophagitis was identified in the setting of a 6 cm hiatal hernia. No evidence of extrinsic compression or any other upper upper GI abnormality.    Retroflexion was not performed.  The scope was then withdrawn from the patient and the procedure terminated.  COMPLICATIONS:  None  IMPRESSION:  1) LA Grade D Esophagitis 2) 6 cm hiatal hernia RECOMMENDATIONS:  1) PPI BID x 1 month and then QD. 2) Continued work up and treatment of the RLL mass per [facility].  ______________________________ Jeani Hawking, MD  CPT CODES:  [id number]  DIAGNOSIS CODES:  530.19, 553.3, 787.20  CC:  [name], M.D.  n. Rosalie DoctorMarland Kitchen   Jeani Hawking at 05/21/2011 11:50 AM  Evans Lance, [phone]

## 2011-05-21 NOTE — Interval H&P Note (Signed)
History and Physical Interval Note:  05/21/2011 11:24 AM  Andrea Morgan  has presented today for surgery, with the diagnosis of dysphagia  The various methods of treatment have been discussed with the patient and family. After consideration of risks, benefits and other options for treatment, the patient has consented to  Procedure(s): ESOPHAGOGASTRODUODENOSCOPY (EGD) as a surgical intervention .  The patients' history has been reviewed, patient examined, no change in status, stable for surgery.  I have reviewed the patients' chart and labs.  Questions were answered to the patient's satisfaction.     Eithan Beagle D

## 2011-05-24 ENCOUNTER — Encounter (HOSPITAL_COMMUNITY): Payer: Self-pay | Admitting: Gastroenterology

## 2011-05-25 ENCOUNTER — Ambulatory Visit: Payer: Self-pay | Admitting: Gastroenterology

## 2011-05-26 ENCOUNTER — Ambulatory Visit (HOSPITAL_COMMUNITY)
Admission: RE | Admit: 2011-05-26 | Discharge: 2011-05-26 | Disposition: A | Discharge status: Home / Self Care | Payer: Self-pay | Source: Ambulatory Visit | Attending: Emergency Medicine | Admitting: Emergency Medicine

## 2011-05-26 ENCOUNTER — Encounter (HOSPITAL_COMMUNITY): Payer: Self-pay

## 2011-05-26 ENCOUNTER — Encounter: Payer: Self-pay | Admitting: Cardiothoracic Surgery

## 2011-05-26 ENCOUNTER — Institutional Professional Consult (permissible substitution) (INDEPENDENT_AMBULATORY_CARE_PROVIDER_SITE_OTHER): Payer: Self-pay | Admitting: Cardiothoracic Surgery

## 2011-05-26 ENCOUNTER — Encounter (HOSPITAL_COMMUNITY)
Admission: RE | Admit: 2011-05-26 | Discharge: 2011-05-26 | Disposition: A | Discharge status: Home / Self Care | Payer: Self-pay | Source: Ambulatory Visit | Attending: Emergency Medicine | Admitting: Emergency Medicine

## 2011-05-26 VITALS — BP 151/84 | HR 96 | Resp 20 | Ht 66.0 in | Wt 154.0 lb

## 2011-05-26 DIAGNOSIS — K7689 Other specified diseases of liver: Secondary | ICD-10-CM | POA: Insufficient documentation

## 2011-05-26 DIAGNOSIS — R222 Localized swelling, mass and lump, trunk: Secondary | ICD-10-CM | POA: Insufficient documentation

## 2011-05-26 DIAGNOSIS — R0989 Other specified symptoms and signs involving the circulatory and respiratory systems: Secondary | ICD-10-CM | POA: Insufficient documentation

## 2011-05-26 DIAGNOSIS — R05 Cough: Secondary | ICD-10-CM | POA: Insufficient documentation

## 2011-05-26 DIAGNOSIS — R918 Other nonspecific abnormal finding of lung field: Secondary | ICD-10-CM

## 2011-05-26 DIAGNOSIS — I2510 Atherosclerotic heart disease of native coronary artery without angina pectoris: Secondary | ICD-10-CM | POA: Insufficient documentation

## 2011-05-26 DIAGNOSIS — R0609 Other forms of dyspnea: Secondary | ICD-10-CM | POA: Insufficient documentation

## 2011-05-26 DIAGNOSIS — D3500 Benign neoplasm of unspecified adrenal gland: Secondary | ICD-10-CM | POA: Insufficient documentation

## 2011-05-26 DIAGNOSIS — J988 Other specified respiratory disorders: Secondary | ICD-10-CM | POA: Insufficient documentation

## 2011-05-26 DIAGNOSIS — R059 Cough, unspecified: Secondary | ICD-10-CM | POA: Insufficient documentation

## 2011-05-26 LAB — PULMONARY FUNCTION TEST

## 2011-05-26 LAB — GLUCOSE, CAPILLARY: Glucose-Capillary: 94 mg/dL (ref 70–99)

## 2011-05-26 MED ORDER — ALBUTEROL SULFATE (5 MG/ML) 0.5% IN NEBU
2.5000 mg | INHALATION_SOLUTION | Freq: Once | RESPIRATORY_TRACT | Status: AC
  Administered 2011-05-26: 2.5 mg via RESPIRATORY_TRACT

## 2011-05-26 MED ORDER — FLUDEOXYGLUCOSE F - 18 (FDG) INJECTION
19.0000 | Freq: Once | INTRAVENOUS | Status: AC | PRN
  Administered 2011-05-26: 19 via INTRAVENOUS

## 2011-05-26 NOTE — Progress Notes (Addendum)
Patient ID: Andrea Morgan, female   DOB: [birth year], 61 y.o.   MRN: [id number]                   [address]            [place] [address]          [phone]      Andrea Morgan [facility] Medical Record #[id number] Date of Birth: Sep 16, 1950  Referring: Andrea Morgan., MD Primary Care: Andrea Mulling, MD, MD  Chief Complaint:    Chief Complaint  Patient presents with  . Lung Mass    Referral from Dr Delton Coombes for surgical eval on Lung mass, PFT'S, PET Scan 05/26/11    History of Present Illness:    Patient is a 61 year old smoker who presented at the end of December, 2012 with chest discomfort and projectile vomiting. A CT scan was done while in the emergency room that showed a 10 cm mass involving right lower chest. The patient is referred today for consideration of resection. PET scan and PFTs were done today prior to her visit. Attempts at bronchoscopy were unsuccessful in obtaining a diagnosis. She's been a long-term smoker over 40 years but quit in December.      Current Activity/ Functional Status: Patient is independent with mobility/ambulation, transfers, ADL's, IADL's.   Past Medical History  Diagnosis Date  . Insomnia   . Chronic headaches   . Allergic rhinitis   . Depression   . Depression     Past Surgical History  Procedure Date  . Appendectomy 1978  . Tonsillectomy 1971  . Cervical fusion 1997  . Video bronchoscopy 05/11/2011    Procedure: VIDEO BRONCHOSCOPY WITH FLUORO;  Surgeon: Andrea Peer, MD;  Location: [facility];  Service: Cardiopulmonary;  Laterality: Bilateral;  . Cervical fusion   . Esophagogastroduodenoscopy 05/21/2011    Procedure: ESOPHAGOGASTRODUODENOSCOPY (EGD);  Surgeon: Andrea Belfast, MD;  Location: [facility];  Service: Endoscopy;  Laterality: N/A;    Family History  Problem Relation Age of Onset  . Asthma Mother   . Asthma Maternal Grandmother   . Heart disease Mother   . Lung cancer Father Died age  58 lung cancer  . Ovarian cancer Maternal Aunt   . Breast cancer Maternal Aunt     x2  . Prostate cancer Maternal Uncle   . Kidney cancer Sister Renal Cell 70    History   Social History  . Marital Status: Divorced    Spouse Name: N/A    Number of Children: N/A  . 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 Smoking status: Former Smoker -- 1.0 packs/day for 45 years    Types: Cigarettes    Quit date: 04/14/2011  . Smokeless tobacco: Not on file  . Alcohol Use: No  . Drug Use: No  . Sexually Active: Not on file   Other Topics Concern  . Not on file   Social History Narrative  . No narrative on file    History  Smoking status  . Former Smoker -- 1.0 packs/day for 45 years  . Types: Cigarettes  . Quit date: 04/14/2011  Smokeless tobacco  . Not on file    History  Alcohol Use No     Allergies  Allergen Reactions  . Sulfa Antibiotics Hives    Current Outpatient Prescriptions  Medication Sig Dispense Refill  . DULoxetine (  CYMBALTA) 30 MG capsule Take 30 mg by mouth 2 (two) times daily.       Marland Kitchen gabapentin (NEURONTIN) 300 MG capsule Take 300 mg by mouth 3 (three) times daily.      Marland Kitchen omeprazole (PRILOSEC) 40 MG capsule Take 40 mg by mouth daily.      . ondansetron (ZOFRAN) 8 MG tablet Take by mouth every 8 (eight) hours as needed. For nausea      . oxyCODONE-acetaminophen (PERCOCET) 5-325 MG per tablet Take 1 tablet by mouth every 6 (six) hours as needed. For pain      . zolpidem (AMBIEN) 10 MG tablet Take 10 mg by mouth at bedtime as needed.       No current facility-administered medications for this visit.   Facility-Administered Medications Ordered in Other Visits  Medication Dose Route Frequency Provider Last Rate Last Dose  . albuterol (PROVENTIL) (5 MG/ML) 0.5% nebulizer solution 2.5 mg  2.5 mg Nebulization Once Andrea Peer, MD   2.5 mg at 05/26/11 1204  . fludeoxyglucose F - 18  (FDG) injection 19 milli Curie  19 milli Curie Intravenous Once PRN Medication Radiologist, MD   19 milli Curie at 05/26/11 0920       Review of Systems:     Cardiac Review of Systems: Y or N  Chest Pain [   y ]  Resting SOB [  n ] Exertional SOB  [  y]  Orthopnea [  ]   Pedal Edema [  n ]    Palpitations [ n ] Syncope  [n  ]   Presyncope [ n  ]  General Review of Systems: [Y] = yes [  ]=no Constitional: recent weight change [y 13 lbs  ]; anorexia [  y]; fatigue [  ]; nausea [ y ]; night sweats [ y]; fever [n  ]; or chills [  ];                                                                                                                                          Dental: poor dentition[ y ]; .  Eye : blurred vision [  ]; diplopia [   ]; vision changes [  ];  Amaurosis fugax[  ]; Resp: cough [  ];  wheezing[ n ];  hemoptysis[  ]; shortness of breath[  ]; paroxysmal nocturnal dyspnea[  ]; dyspnea on exertion[  ]; or orthopnea[  ];  GI:  gallstones[  ], vomiting[  ];  dysphagia[ y ]; melena[ n ];  hematochezia [ n ]; heartburn[  ];   Hx of  Colonoscopy[  ]; GU: kidney stones [  ]; hematuria[  ];   dysuria [  ];  nocturia[  ];  history of     obstruction [  ];             Skin: rash, swelling[  ];,  hair loss[  ];  peripheral edema[  ];  or itching[  ]; Musculosketetal: myalgias[  ];  joint swelling[  ];  joint erythema[  ];  joint pain[  ];  back pain[  ];  Heme/Lymph: bruising[  ];  bleeding[  ];  anemia[  ];  Neuro: TIA[ n ];  headaches[  ];  stroke[ n ];  vertigo[  ];  seizures[  ];   paresthesias[  ];  difficulty walking[  ];  Psych:depression[  ]; anxiety[  ];  Endocrine: diabetes[  ];  thyroid dysfunction[  ];  Immunizations: Flu [ n ]; Pneumococcal[ n ];  Other:  Physical Exam: BP 151/84  Pulse 96  Resp 20  Ht 5\' 6"  (1.676 m)  Wt 154 lb (69.854 kg)  BMI 24.86 kg/m2  SpO2 97%  General appearance: alert, cooperative, appears older than stated age and no distress Neurologic:  intact Heart: regular rate and rhythm, S1, S2 normal, no murmur, click, rub or gallop Lungs: clear to auscultation bilaterally Abdomen: soft, non-tender; bowel sounds normal; no masses,  no organomegaly Extremities: extremities normal, atraumatic, no cyanosis or edema, Homans sign is negative, no sign of DVT and no edema, redness or tenderness in the calves or thighs There is no palpable cervical or supraclavicular or axillary adenopathy there is no inguinal  adenopathy   Diagnostic Studies & Laboratory data:     Recent Radiology Findings:   Nm Pet Image Initial (pi) Skull Base To Thigh  05/26/2011  *RADIOLOGY REPORT*  Clinical Data:  Initial treatment strategy for right lower lobe mass.  NUCLEAR MEDICINE PET CT INITIAL (PI) SKULL BASE TO THIGH  Technique:  19.0 mCi F-18 FDG was injected intravenously via the right antecubital fossa.  Full-ring PET imaging was performed from the skull base through the mid-thighs 64  minutes after injection. CT data was obtained and used for attenuation correction and anatomic localization only.  (This was not acquired as a diagnostic CT examination.)  Fasting Blood Glucose:  94  Patient Weight:  154 pounds.  Comparison:  CT chest from 05/06/2011  Findings: No evidence for unexpected radiotracer uptake in the neck.  The large right lower lobe pulmonary mass shows low level F D G accumulation.  Uptake is essentially at background soft tissue levels with SUV max = 3-4.  No other areas of unexpected or suspicious radiotracer uptake are seen in the chest.  Two areas of F D G uptake identified in the thoracic spine the more prominent of the two localizes to the region of the T4 superior endplate.  The more subtle of the two localizes to the region of the T1-2 interspace.  Review of the CT images shows no soft tissue lesion nor bony abnormality in either location.  There is no abnormal F D G accumulation in the abdomen.  The 17 mm left adrenal nodule has an average attenuation  of -12 Hounsfield units on today's study, consistent with benign adrenal adenoma. This nodule shows no F D G uptake above background levels.  No evidence for unexpected or suspicious radiotracer accumulation in the pelvis.  CT data obtained for attenuation correction reveals coronary artery calcification.  There is a tiny (7 mm) low density lesion in the inferior right liver this shows no evidence for hypermetabolism.  IMPRESSION: Large right lower lobe mass is mildly hypermetabolic with levels just above background soft tissue.  Well differentiated or low grade neoplasm remains within the differential consideration.  Two areas of hypermetabolism identified in the  upper thoracic spine.  No underlying CT abnormality is identified to account for this uptake.  Given that the activity appears to localize to the regions of the discs or endplates, it may well be degenerative. There is no destructive bony lesion in either location.  Thoracic MRI without and with contrast could be used to further evaluate, as warranted.  Benign left adrenal adenoma without evidence of hypermetabolism.  Original Report Authenticated By: ERIC A. MANSELL, M.D.      Recent Lab Findings: Lab Results  Component Value Date   WBC 9.2 04/14/2011   HGB 14.3 04/14/2011   HCT 41.1 04/14/2011   PLT 315 04/14/2011   GLUCOSE 99 04/14/2011   ALT 19 04/14/2011   AST 24 04/14/2011   NA 138 04/14/2011   K 4.0 04/14/2011   CL 104 04/14/2011   CREATININE 0.76 04/14/2011   BUN 14 04/14/2011   CO2 25 04/14/2011   PFTs were done and are adequate for resection   Assessment / Plan:     10 cm right chest mass, without significant PET uptake. The mass does not appear to involve the esophagus or invade into the pericardium. It does not appear to involve the cava. I reviewed the films with the patient and her sister and have recommended that we proceed with surgical resection. It is not clear that this is lung cancer and in fact may not be in  the lung. The risks and options were discussed with the patient. Her questions have been answered after being told that she has a large lung cancer since late December she is anxious to proceed with treatment. At this point I told her that proceeding with needle biopsy first but not change our course of action as the mass needs to be resected.  The goals risks and alternatives of the planned surgical procedure RT VATS and resection of Chest Mass have been discussed with the patient in detail. The risks of the procedure including death, infection, stroke, myocardial infarction, bleeding, blood transfusion have all been discussed specifically.  I have quoted Andrea Morgan a 4 % of perioperative mortality and a complication rate as high as 20%. The patient's questions have been answered.Andrea Morgan is willing  to proceed with the planned procedure.  The plan to proceed on February 13   Andrea Ovens MD  Beeper [phone] Office [phone] 05/26/2011 4:06 PM

## 2011-05-27 ENCOUNTER — Encounter (HOSPITAL_COMMUNITY): Payer: Self-pay | Admitting: Pharmacy Technician

## 2011-05-28 ENCOUNTER — Other Ambulatory Visit: Payer: Self-pay

## 2011-05-28 DIAGNOSIS — D381 Neoplasm of uncertain behavior of trachea, bronchus and lung: Secondary | ICD-10-CM

## 2011-05-31 ENCOUNTER — Other Ambulatory Visit (HOSPITAL_COMMUNITY): Payer: Self-pay | Admitting: *Deleted

## 2011-05-31 ENCOUNTER — Inpatient Hospital Stay (HOSPITAL_COMMUNITY): Admission: RE | Admit: 2011-05-31 | Discharge: 2011-05-31 | Payer: Self-pay | Source: Ambulatory Visit

## 2011-05-31 NOTE — Pre-Procedure Instructions (Signed)
20 Andrea Morgan  05/31/2011   Your procedure is scheduled on:06-02-2011 @ 10:50 AM   Report to [facility] at 7:30 AM.  Call this number if you have problems the morning of surgery: [phone]   Remember:   Do not eat food:After Midnight.  May have clear liquids: up to 4 Hours before arrival.  Clear liquids include soda, tea, black coffee, apple or grape juice, broth.until 3:30 AM  Take these medicines the morning of surgery with A SIP OF WATER:cymbalta,neurontin,omperazole,oxycodone if needed,   Do not wear jewelry, make-up or nail polish.  Do not wear lotions, powders, or perfumes. You may wear deodorant.  Do not shave 48 hours prior to surgery.  Do not bring valuables to the hospital.  Contacts, dentures or bridgework may not be worn into surgery.  Leave suitcase in the car. After surgery it may be brought to your room.  For patients admitted to the hospital, checkout time is 11:00 AM the day of discharge.      Special Instructions: CHG Shower Use Special Wash: 1/2 bottle night before surgery and 1/2 bottle morning of surgery.   Please read over the following fact sheets that you were given: Pain Booklet, Blood Transfusion Information, MRSA Information and Surgical Site Infection Prevention

## 2011-06-01 ENCOUNTER — Encounter (HOSPITAL_COMMUNITY)
Admission: RE | Admit: 2011-06-01 | Discharge: 2011-06-01 | Disposition: A | Discharge status: Home / Self Care | Payer: Self-pay | Source: Ambulatory Visit | Attending: Cardiothoracic Surgery | Admitting: Cardiothoracic Surgery

## 2011-06-01 ENCOUNTER — Other Ambulatory Visit: Payer: Self-pay

## 2011-06-01 ENCOUNTER — Encounter (HOSPITAL_COMMUNITY): Payer: Self-pay

## 2011-06-01 DIAGNOSIS — D381 Neoplasm of uncertain behavior of trachea, bronchus and lung: Secondary | ICD-10-CM

## 2011-06-01 HISTORY — DX: Nausea with vomiting, unspecified: R11.2

## 2011-06-01 HISTORY — DX: Gastro-esophageal reflux disease without esophagitis: K21.9

## 2011-06-01 HISTORY — DX: Anxiety disorder, unspecified: F41.9

## 2011-06-01 HISTORY — DX: Malignant (primary) neoplasm, unspecified: C80.1

## 2011-06-01 HISTORY — DX: Other specified postprocedural states: Z98.890

## 2011-06-01 LAB — URINALYSIS, ROUTINE W REFLEX MICROSCOPIC
Bilirubin Urine: NEGATIVE
Glucose, UA: NEGATIVE mg/dL
Hgb urine dipstick: NEGATIVE
Ketones, ur: NEGATIVE mg/dL
Leukocytes, UA: NEGATIVE
Nitrite: NEGATIVE
Protein, ur: NEGATIVE mg/dL
Specific Gravity, Urine: 1.026 (ref 1.005–1.030)
Urobilinogen, UA: 0.2 mg/dL (ref 0.0–1.0)
pH: 5.5 (ref 5.0–8.0)

## 2011-06-01 LAB — CBC
HCT: 36.2 % (ref 36.0–46.0)
Hemoglobin: 12.1 g/dL (ref 12.0–15.0)
MCH: 27.9 pg (ref 26.0–34.0)
MCHC: 33.4 g/dL (ref 30.0–36.0)
MCV: 83.4 fL (ref 78.0–100.0)
Platelets: 245 10*3/uL (ref 150–400)
RBC: 4.34 MIL/uL (ref 3.87–5.11)
RDW: 14.1 % (ref 11.5–15.5)
WBC: 5.8 10*3/uL (ref 4.0–10.5)

## 2011-06-01 LAB — COMPREHENSIVE METABOLIC PANEL
ALT: 15 U/L (ref 0–35)
AST: 19 U/L (ref 0–37)
Albumin: 3.6 g/dL (ref 3.5–5.2)
Alkaline Phosphatase: 55 U/L (ref 39–117)
BUN: 14 mg/dL (ref 6–23)
CO2: 21 mEq/L (ref 19–32)
Calcium: 9 mg/dL (ref 8.4–10.5)
Chloride: 106 mEq/L (ref 96–112)
Creatinine, Ser: 0.67 mg/dL (ref 0.50–1.10)
GFR calc Af Amer: >90 mL/min (ref >90)
GFR calc non Af Amer: >90 mL/min (ref >90)
Glucose, Bld: 98 mg/dL (ref 70–99)
Potassium: 3.7 mEq/L (ref 3.5–5.1)
Sodium: 140 mEq/L (ref 135–145)
Total Bilirubin: 0.3 mg/dL (ref 0.3–1.2)
Total Protein: 6.6 g/dL (ref 6.0–8.3)

## 2011-06-01 LAB — BLOOD GAS, ARTERIAL
Acid-Base Excess: 0.3 mmol/L (ref 0.0–2.0)
Bicarbonate: 24 mEq/L (ref 20.0–24.0)
Drawn by: 344381
FIO2: 0.21 %
O2 Saturation: 95.9 %
Patient temperature: 98.6
TCO2: 25.1 mmol/L (ref 0–100)
pCO2 arterial: 36 mmHg (ref 35.0–45.0)
pH, Arterial: 7.44 — ABNORMAL HIGH (ref 7.350–7.400)
pO2, Arterial: 74.6 mmHg — ABNORMAL LOW (ref 80.0–100.0)

## 2011-06-01 LAB — TYPE AND SCREEN
ABO/RH(D): A NEG
Antibody Screen: NEGATIVE

## 2011-06-01 LAB — SURGICAL PCR SCREEN
MRSA, PCR: NEGATIVE
Staphylococcus aureus: NEGATIVE

## 2011-06-01 LAB — ABO/RH: ABO/RH(D): A NEG

## 2011-06-01 LAB — PROTIME-INR
INR: 1.04 (ref 0.00–1.49)
Prothrombin Time: 13.8 seconds (ref 11.6–15.2)

## 2011-06-01 LAB — APTT: aPTT: 26 seconds (ref 24–37)

## 2011-06-01 MED ORDER — DEXTROSE 5 % IV SOLN
1.5000 g | INTRAVENOUS | Status: DC
  Filled 2011-06-01: qty 1.5

## 2011-06-01 MED ORDER — DEXTROSE 5 % IV SOLN
1.5000 g | INTRAVENOUS | Status: AC
  Administered 2011-06-02: 1.5 g via INTRAVENOUS
  Filled 2011-06-01: qty 1.5

## 2011-06-02 ENCOUNTER — Encounter (HOSPITAL_COMMUNITY): Payer: Self-pay | Admitting: *Deleted

## 2011-06-02 ENCOUNTER — Ambulatory Visit (HOSPITAL_COMMUNITY): Payer: Self-pay

## 2011-06-02 ENCOUNTER — Other Ambulatory Visit: Payer: Self-pay | Admitting: Cardiothoracic Surgery

## 2011-06-02 ENCOUNTER — Ambulatory Visit (HOSPITAL_COMMUNITY): Payer: Self-pay | Admitting: Anesthesiology

## 2011-06-02 ENCOUNTER — Encounter (HOSPITAL_COMMUNITY): Payer: Self-pay | Admitting: Anesthesiology

## 2011-06-02 ENCOUNTER — Inpatient Hospital Stay (HOSPITAL_COMMUNITY)
Admission: RE | Admit: 2011-06-02 | Discharge: 2011-06-05 | DRG: 165 | Disposition: A | Discharge status: Home / Self Care | Payer: MEDICAID | Source: Ambulatory Visit | Attending: Cardiothoracic Surgery | Admitting: Cardiothoracic Surgery

## 2011-06-02 ENCOUNTER — Encounter (HOSPITAL_COMMUNITY)
Admission: RE | Disposition: A | Discharge status: Home / Self Care | Payer: Self-pay | Source: Ambulatory Visit | Attending: Cardiothoracic Surgery

## 2011-06-02 DIAGNOSIS — Z882 Allergy status to sulfonamides status: Secondary | ICD-10-CM

## 2011-06-02 DIAGNOSIS — F329 Major depressive disorder, single episode, unspecified: Secondary | ICD-10-CM | POA: Diagnosis present

## 2011-06-02 DIAGNOSIS — J309 Allergic rhinitis, unspecified: Secondary | ICD-10-CM | POA: Diagnosis present

## 2011-06-02 DIAGNOSIS — Z01810 Encounter for preprocedural cardiovascular examination: Secondary | ICD-10-CM

## 2011-06-02 DIAGNOSIS — Z79899 Other long term (current) drug therapy: Secondary | ICD-10-CM

## 2011-06-02 DIAGNOSIS — Z01818 Encounter for other preprocedural examination: Secondary | ICD-10-CM

## 2011-06-02 DIAGNOSIS — Z87442 Personal history of urinary calculi: Secondary | ICD-10-CM

## 2011-06-02 DIAGNOSIS — R222 Localized swelling, mass and lump, trunk: Secondary | ICD-10-CM | POA: Diagnosis present

## 2011-06-02 DIAGNOSIS — F411 Generalized anxiety disorder: Secondary | ICD-10-CM | POA: Diagnosis present

## 2011-06-02 DIAGNOSIS — D381 Neoplasm of uncertain behavior of trachea, bronchus and lung: Secondary | ICD-10-CM

## 2011-06-02 DIAGNOSIS — K449 Diaphragmatic hernia without obstruction or gangrene: Secondary | ICD-10-CM | POA: Diagnosis present

## 2011-06-02 DIAGNOSIS — Z23 Encounter for immunization: Secondary | ICD-10-CM

## 2011-06-02 DIAGNOSIS — C761 Malignant neoplasm of thorax: Principal | ICD-10-CM | POA: Diagnosis present

## 2011-06-02 DIAGNOSIS — Z01812 Encounter for preprocedural laboratory examination: Secondary | ICD-10-CM

## 2011-06-02 DIAGNOSIS — Z87891 Personal history of nicotine dependence: Secondary | ICD-10-CM

## 2011-06-02 DIAGNOSIS — K219 Gastro-esophageal reflux disease without esophagitis: Secondary | ICD-10-CM | POA: Diagnosis present

## 2011-06-02 DIAGNOSIS — E876 Hypokalemia: Secondary | ICD-10-CM | POA: Diagnosis not present

## 2011-06-02 DIAGNOSIS — Z981 Arthrodesis status: Secondary | ICD-10-CM

## 2011-06-02 DIAGNOSIS — F3289 Other specified depressive episodes: Secondary | ICD-10-CM | POA: Diagnosis present

## 2011-06-02 HISTORY — PX: MASS EXCISION: SHX2000

## 2011-06-02 HISTORY — DX: Personal history of urinary calculi: Z87.442

## 2011-06-02 HISTORY — PX: VIDEO BRONCHOSCOPY: SHX5072

## 2011-06-02 SURGERY — BRONCHOSCOPY, VIDEO-ASSISTED
Anesthesia: General | Site: Chest | Wound class: Clean Contaminated

## 2011-06-02 MED ORDER — MIDAZOLAM HCL 5 MG/5ML IJ SOLN
INTRAMUSCULAR | Status: DC | PRN
  Administered 2011-06-02: 2 mg via INTRAVENOUS

## 2011-06-02 MED ORDER — ARTIFICIAL TEARS OP OINT
TOPICAL_OINTMENT | OPHTHALMIC | Status: DC | PRN
  Administered 2011-06-02: 1 via OPHTHALMIC

## 2011-06-02 MED ORDER — HETASTARCH-ELECTROLYTES 6 % IV SOLN
INTRAVENOUS | Status: DC | PRN
  Administered 2011-06-02: 11:00:00 via INTRAVENOUS

## 2011-06-02 MED ORDER — DEXTROSE 5 % IV SOLN
1.5000 g | Freq: Two times a day (BID) | INTRAVENOUS | Status: AC
  Filled 2011-06-02 (×2): qty 1.5

## 2011-06-02 MED ORDER — BISACODYL 5 MG PO TBEC
10.0000 mg | DELAYED_RELEASE_TABLET | Freq: Every day | ORAL | Status: DC
  Administered 2011-06-03 – 2011-06-05 (×3): 10 mg via ORAL
  Filled 2011-06-02 (×3): qty 2

## 2011-06-02 MED ORDER — SODIUM CHLORIDE 0.9 % IJ SOLN: 9.0000 mL | INTRAMUSCULAR | Status: DC | PRN

## 2011-06-02 MED ORDER — DIPHENHYDRAMINE HCL 12.5 MG/5ML PO ELIX
12.5000 mg | ORAL_SOLUTION | Freq: Four times a day (QID) | ORAL | Status: DC | PRN
  Filled 2011-06-02: qty 5

## 2011-06-02 MED ORDER — LACTATED RINGERS IV SOLN
INTRAVENOUS | Status: DC | PRN
  Administered 2011-06-02: 08:00:00 via INTRAVENOUS

## 2011-06-02 MED ORDER — ZOLPIDEM TARTRATE 5 MG PO TABS
10.0000 mg | ORAL_TABLET | Freq: Every evening | ORAL | Status: DC | PRN
  Administered 2011-06-03 – 2011-06-04 (×2): 10 mg via ORAL
  Filled 2011-06-02: qty 1
  Filled 2011-06-02: qty 2
  Filled 2011-06-02: qty 1
  Filled 2011-06-02: qty 2

## 2011-06-02 MED ORDER — FENTANYL 10 MCG/ML IV SOLN
INTRAVENOUS | Status: DC
  Administered 2011-06-02: 10 ug via INTRAVENOUS
  Administered 2011-06-02: 170 ug via INTRAVENOUS
  Administered 2011-06-02: 130 ug via INTRAVENOUS
  Administered 2011-06-03: 90 ug via INTRAVENOUS
  Administered 2011-06-03: 180 ug via INTRAVENOUS
  Administered 2011-06-03: 100 ug via INTRAVENOUS
  Administered 2011-06-03: 01:00:00 via INTRAVENOUS
  Administered 2011-06-03: 200 ug via INTRAVENOUS
  Administered 2011-06-04: 150 ug via INTRAVENOUS
  Administered 2011-06-04: 60 ug via INTRAVENOUS
  Administered 2011-06-04: 30 ug via INTRAVENOUS
  Filled 2011-06-02 (×3): qty 50

## 2011-06-02 MED ORDER — NALOXONE HCL 0.4 MG/ML IJ SOLN: 0.4000 mg | INTRAMUSCULAR | Status: DC | PRN

## 2011-06-02 MED ORDER — POTASSIUM CHLORIDE IN NACL 20-0.9 MEQ/L-% IV SOLN
INTRAVENOUS | Status: DC
  Administered 2011-06-02 – 2011-06-03 (×2): via INTRAVENOUS
  Filled 2011-06-02 (×5): qty 1000

## 2011-06-02 MED ORDER — GABAPENTIN 300 MG PO CAPS
300.0000 mg | ORAL_CAPSULE | Freq: Three times a day (TID) | ORAL | Status: DC
  Administered 2011-06-02 – 2011-06-05 (×8): 300 mg via ORAL
  Filled 2011-06-02 (×10): qty 1

## 2011-06-02 MED ORDER — DULOXETINE HCL 60 MG PO CPEP
60.0000 mg | ORAL_CAPSULE | Freq: Every morning | ORAL | Status: DC
  Administered 2011-06-03 – 2011-06-05 (×3): 60 mg via ORAL
  Filled 2011-06-02 (×3): qty 1

## 2011-06-02 MED ORDER — INFLUENZA VIRUS VACC SPLIT PF IM SUSP
0.5000 mL | INTRAMUSCULAR | Status: AC
Start: 2011-06-03 — End: 2011-06-04
  Filled 2011-06-02: qty 0.5

## 2011-06-02 MED ORDER — PNEUMOCOCCAL VAC POLYVALENT 25 MCG/0.5ML IJ INJ
0.5000 mL | INJECTION | INTRAMUSCULAR | Status: AC
  Filled 2011-06-02: qty 0.5

## 2011-06-02 MED ORDER — DEXAMETHASONE SODIUM PHOSPHATE 4 MG/ML IJ SOLN
INTRAMUSCULAR | Status: DC | PRN
  Administered 2011-06-02: 4 mg via INTRAVENOUS

## 2011-06-02 MED ORDER — FENTANYL 10 MCG/ML IV SOLN
INTRAVENOUS | Status: AC
  Filled 2011-06-02: qty 50

## 2011-06-02 MED ORDER — GLYCOPYRROLATE 0.2 MG/ML IJ SOLN
INTRAMUSCULAR | Status: DC | PRN
  Administered 2011-06-02: .6 mg via INTRAVENOUS

## 2011-06-02 MED ORDER — TRAMADOL HCL 50 MG PO TABS: 50.0000 mg | ORAL_TABLET | Freq: Four times a day (QID) | ORAL | Status: DC | PRN

## 2011-06-02 MED ORDER — ONDANSETRON HCL 4 MG/2ML IJ SOLN: 4.0000 mg | Freq: Four times a day (QID) | INTRAMUSCULAR | Status: DC | PRN

## 2011-06-02 MED ORDER — NEOSTIGMINE METHYLSULFATE 1 MG/ML IJ SOLN
INTRAMUSCULAR | Status: DC | PRN
  Administered 2011-06-02: 4 mg via INTRAVENOUS

## 2011-06-02 MED ORDER — PROPOFOL 10 MG/ML IV EMUL
INTRAVENOUS | Status: DC | PRN
  Administered 2011-06-02: 20 mg via INTRAVENOUS
  Administered 2011-06-02: 180 mg via INTRAVENOUS

## 2011-06-02 MED ORDER — BUPIVACAINE 0.5 % ON-Q PUMP SINGLE CATH 400 ML
400.0000 mL | INJECTION | Status: AC
  Administered 2011-06-02: 400 mL
  Filled 2011-06-02: qty 400

## 2011-06-02 MED ORDER — ONDANSETRON HCL 4 MG/2ML IJ SOLN
INTRAMUSCULAR | Status: DC | PRN
  Administered 2011-06-02: 4 mg via INTRAVENOUS

## 2011-06-02 MED ORDER — VECURONIUM BROMIDE 10 MG IV SOLR
INTRAVENOUS | Status: DC | PRN
  Administered 2011-06-02: 5 mg via INTRAVENOUS

## 2011-06-02 MED ORDER — OXYCODONE HCL 5 MG PO TABS
5.0000 mg | ORAL_TABLET | ORAL | Status: AC | PRN
  Administered 2011-06-02: 5 mg via ORAL
  Filled 2011-06-02: qty 1

## 2011-06-02 MED ORDER — OXYCODONE-ACETAMINOPHEN 5-325 MG PO TABS
1.0000 | ORAL_TABLET | ORAL | Status: DC | PRN
  Administered 2011-06-04: 2 via ORAL
  Administered 2011-06-05: 1 via ORAL
  Filled 2011-06-02: qty 1
  Filled 2011-06-02: qty 2

## 2011-06-02 MED ORDER — ACETAMINOPHEN 10 MG/ML IV SOLN
1000.0000 mg | Freq: Four times a day (QID) | INTRAVENOUS | Status: AC
  Administered 2011-06-02 – 2011-06-03 (×3): 1000 mg via INTRAVENOUS
  Filled 2011-06-02 (×3): qty 100

## 2011-06-02 MED ORDER — SENNOSIDES-DOCUSATE SODIUM 8.6-50 MG PO TABS
1.0000 | ORAL_TABLET | Freq: Every evening | ORAL | Status: DC | PRN
  Filled 2011-06-02: qty 1

## 2011-06-02 MED ORDER — POTASSIUM CHLORIDE 10 MEQ/50ML IV SOLN: 10.0000 meq | Freq: Every day | INTRAVENOUS | Status: DC | PRN

## 2011-06-02 MED ORDER — ROCURONIUM BROMIDE 100 MG/10ML IV SOLN
INTRAVENOUS | Status: DC | PRN
  Administered 2011-06-02: 50 mg via INTRAVENOUS

## 2011-06-02 MED ORDER — DIPHENHYDRAMINE HCL 50 MG/ML IJ SOLN: 12.5000 mg | Freq: Four times a day (QID) | INTRAMUSCULAR | Status: DC | PRN

## 2011-06-02 MED ORDER — FENTANYL CITRATE 0.05 MG/ML IJ SOLN
INTRAMUSCULAR | Status: DC | PRN
  Administered 2011-06-02: 100 ug via INTRAVENOUS
  Administered 2011-06-02 (×3): 50 ug via INTRAVENOUS
  Administered 2011-06-02: 100 ug via INTRAVENOUS
  Administered 2011-06-02: 50 ug via INTRAVENOUS

## 2011-06-02 MED ORDER — 0.9 % SODIUM CHLORIDE (POUR BTL) OPTIME
TOPICAL | Status: DC | PRN
  Administered 2011-06-02: 200 mL

## 2011-06-02 SURGICAL SUPPLY — 79 items
ADH SKN CLS APL DERMABOND .7 (GAUZE/BANDAGES/DRESSINGS) ×3
APL SRG 22X2 LUM MLBL SLNT (VASCULAR PRODUCTS)
APL SRG 7X2 LUM MLBL SLNT (VASCULAR PRODUCTS)
APPLICATOR TIP COSEAL (VASCULAR PRODUCTS) IMPLANT
APPLICATOR TIP EXT COSEAL (VASCULAR PRODUCTS) IMPLANT
BRUSH CYTOL CELLEBRITY 1.5X140 (MISCELLANEOUS) IMPLANT
CANISTER SUCTION 2500CC (MISCELLANEOUS) ×8 IMPLANT
CATH KIT ON Q 5IN SLV (PAIN MANAGEMENT) ×6 IMPLANT
CATH THORACIC 28FR (CATHETERS) IMPLANT
CATH THORACIC 36FR (CATHETERS) IMPLANT
CATH THORACIC 36FR RT ANG (CATHETERS) IMPLANT
CLIP TI MEDIUM 6 (CLIP) ×4 IMPLANT
CLOTH BEACON ORANGE TIMEOUT ST (SAFETY) ×8 IMPLANT
CONT SPEC 4OZ CLIKSEAL STRL BL (MISCELLANEOUS) ×16 IMPLANT
COVER SURGICAL LIGHT HANDLE (MISCELLANEOUS) ×8 IMPLANT
COVER TABLE BACK 60X90 (DRAPES) ×4 IMPLANT
DERMABOND ADVANCED (GAUZE/BANDAGES/DRESSINGS) ×1
DERMABOND ADVANCED .7 DNX12 (GAUZE/BANDAGES/DRESSINGS) ×1 IMPLANT
DRAPE LAPAROSCOPIC ABDOMINAL (DRAPES) ×8 IMPLANT
DRAPE SLUSH MACHINE 52X66 (DRAPES) ×2 IMPLANT
DRAPE SLUSH/WARMER DISC (DRAPES) IMPLANT
DRILL BIT 7/64X5 (BIT) ×4 IMPLANT
ELECT REM PT RETURN 9FT ADLT (ELECTROSURGICAL) ×8
ELECTRODE REM PT RTRN 9FT ADLT (ELECTROSURGICAL) ×6 IMPLANT
FORCEPS BIOP RJ4 1.8 (CUTTING FORCEPS) IMPLANT
GLOVE BIO SURGEON STRL SZ 6.5 (GLOVE) ×16 IMPLANT
GOWN STRL NON-REIN LRG LVL3 (GOWN DISPOSABLE) ×24 IMPLANT
HANDLE STAPLE ENDO GIA SHORT (STAPLE) ×1
HEMOSTAT SURGICEL 2X14 (HEMOSTASIS) IMPLANT
KIT BASIN OR (CUSTOM PROCEDURE TRAY) ×8 IMPLANT
KIT ROOM TURNOVER OR (KITS) ×8 IMPLANT
KIT SUCTION CATH 14FR (SUCTIONS) ×4 IMPLANT
MARKER SKIN DUAL TIP RULER LAB (MISCELLANEOUS) ×4 IMPLANT
NDL BIOPSY TRANSBRONCH 21G (NEEDLE) IMPLANT
NEEDLE BIOPSY TRANSBRONCH 21G (NEEDLE) IMPLANT
NS IRRIG 1000ML POUR BTL (IV SOLUTION) ×16 IMPLANT
OIL SILICONE PENTAX (PARTS (SERVICE/REPAIRS)) ×4 IMPLANT
PACK CHEST (CUSTOM PROCEDURE TRAY) ×8 IMPLANT
PAD ARMBOARD 7.5X6 YLW CONV (MISCELLANEOUS) ×16 IMPLANT
POUCH ENDO CATCH II 15MM (MISCELLANEOUS) ×2 IMPLANT
RELOAD GIA 30 2.0 ROTI (ENDOMECHANICALS) ×6 IMPLANT
SEALANT PROGEL (MISCELLANEOUS) IMPLANT
SEALANT SURG COSEAL 4ML (VASCULAR PRODUCTS) IMPLANT
SEALANT SURG COSEAL 8ML (VASCULAR PRODUCTS) IMPLANT
SOLUTION ANTI FOG 6CC (MISCELLANEOUS) ×6 IMPLANT
SPONGE GAUZE 4X4 12PLY (GAUZE/BANDAGES/DRESSINGS) ×8 IMPLANT
STAPLER ENDO GIA 12 SHRT THIN (STAPLE) IMPLANT
STAPLER ENDO GIA 12MM SHORT (STAPLE) ×3 IMPLANT
SUT PROLENE 3 0 SH DA (SUTURE) IMPLANT
SUT PROLENE 4 0 RB 1 (SUTURE)
SUT PROLENE 4-0 RB1 .5 CRCL 36 (SUTURE) IMPLANT
SUT SILK  1 MH (SUTURE) ×4
SUT SILK 1 MH (SUTURE) ×12 IMPLANT
SUT SILK 2 0SH CR/8 30 (SUTURE) IMPLANT
SUT SILK 3 0SH CR/8 30 (SUTURE) ×2 IMPLANT
SUT VIC AB 1 CTX 18 (SUTURE) ×6 IMPLANT
SUT VIC AB 1 CTX 36 (SUTURE)
SUT VIC AB 1 CTX36XBRD ANBCTR (SUTURE) IMPLANT
SUT VIC AB 2-0 CTX 36 (SUTURE) ×10 IMPLANT
SUT VIC AB 2-0 UR6 27 (SUTURE) IMPLANT
SUT VIC AB 3-0 SH 8-18 (SUTURE) IMPLANT
SUT VIC AB 3-0 X1 27 (SUTURE) ×10 IMPLANT
SUT VICRYL 2 TP 1 (SUTURE) ×2 IMPLANT
SWAB COLLECTION DEVICE MRSA (MISCELLANEOUS) ×4 IMPLANT
SYR 20ML ECCENTRIC (SYRINGE) ×4 IMPLANT
SYSTEM SAHARA CHEST DRAIN ATS (WOUND CARE) ×4 IMPLANT
SYSTEM SAHARA CHEST DRAIN RE-I (WOUND CARE) ×4 IMPLANT
TAPE CLOTH 4X10 WHT NS (GAUZE/BANDAGES/DRESSINGS) ×4 IMPLANT
TAPE CLOTH SURG 4X10 WHT LF (GAUZE/BANDAGES/DRESSINGS) ×2 IMPLANT
TAPE UMBILICAL COTTON 1/8X30 (MISCELLANEOUS) IMPLANT
TIP APPLICATOR SPRAY EXTEND 16 (VASCULAR PRODUCTS) IMPLANT
TOWEL OR 17X24 6PK STRL BLUE (TOWEL DISPOSABLE) ×12 IMPLANT
TOWEL OR 17X26 10 PK STRL BLUE (TOWEL DISPOSABLE) ×16 IMPLANT
TRAP SPECIMEN MUCOUS 40CC (MISCELLANEOUS) ×8 IMPLANT
TRAY FOLEY CATH 14FRSI W/METER (CATHETERS) ×8 IMPLANT
TUBE ANAEROBIC SPECIMEN COL (MISCELLANEOUS) ×4 IMPLANT
TUBE CONNECTING 12X1/4 (SUCTIONS) ×4 IMPLANT
TUNNELER SHEATH ON-Q 11GX8 (MISCELLANEOUS) IMPLANT
WATER STERILE IRR 1000ML POUR (IV SOLUTION) ×12 IMPLANT

## 2011-06-02 NOTE — Anesthesia Preprocedure Evaluation (Addendum)
Anesthesia Evaluation  Patient identified by MRN, date of birth, ID band Patient awake    Reviewed: Allergy & Precautions, H&P , NPO status , Patient's Chart, lab work & pertinent test results  History of Anesthesia Complications (+) PONV  Airway Mallampati: II TM Distance: >3 FB Neck ROM: full    Dental  (+) Teeth Intact and Dental Advisory Given   Pulmonary shortness of breath and with exertion, former smoker         Cardiovascular Exercise Tolerance: Poor     Neuro/Psych  Headaches, PSYCHIATRIC DISORDERS Anxiety Depression    GI/Hepatic Neg liver ROS, hiatal hernia, GERD-  Medicated and Controlled,  Endo/Other  Negative Endocrine ROS  Renal/GU negative Renal ROS     Musculoskeletal   Abdominal   Peds  Hematology negative hematology ROS (+)   Anesthesia Other Findings   Reproductive/Obstetrics negative OB ROS                          Anesthesia Physical Anesthesia Plan  ASA: III  Anesthesia Plan: General   Post-op Pain Management:    Induction: Intravenous  Airway Management Planned: Double Lumen EBT  Additional Equipment: Arterial line and CVP  Intra-op Plan:   Post-operative Plan: Extubation in OR  Informed Consent: I have reviewed the patients History and Physical, chart, labs and discussed the procedure including the risks, benefits and alternatives for the proposed anesthesia with the patient or authorized representative who has indicated his/her understanding and acceptance.     Plan Discussed with: CRNA and Surgeon  Anesthesia Plan Comments:         Anesthesia Quick Evaluation

## 2011-06-02 NOTE — Anesthesia Postprocedure Evaluation (Signed)
Anesthesia Post Note  Patient: Andrea Morgan  Procedure(s) Performed: Procedure(s) (LRB): VIDEO BRONCHOSCOPY (N/A) VIDEO ASSISTED THORACOSCOPY (VATS)/WEDGE RESECTION (Right)  Anesthesia type: General  Patient location: PACU  Post pain: Pain level controlled and Adequate analgesia  Post assessment: Post-op Vital signs reviewed, Patient's Cardiovascular Status Stable, Respiratory Function Stable, Patent Airway and Pain level controlled  Last Vitals:  Filed Vitals:   06/02/11 1133  BP: 144/62  Pulse: 97  Temp:   Resp: 14    Post vital signs: Reviewed and stable  Level of consciousness: awake, alert  and oriented  Complications: No apparent anesthesia complications

## 2011-06-02 NOTE — Brief Op Note (Addendum)
06/02/2011  10:45 AM  PATIENT:  Andrea Morgan  61 y.o. female  PRE-OPERATIVE DIAGNOSIS:  RIGHT LUNG MASS  POST-OPERATIVE DIAGNOSIS:  RIGHT LUNG MASS  PROCEDURE:  Procedure(s) (LRB): VIDEO BRONCHOSCOPY (N/A), RIGHT VIDEO ASSISTED THORACOSCOPY (VATS), Excision of medistinal mass  SURGEON:  Surgeon(s) and Role:    * Delight Ovens, MD - Primary  PHYSICIAN ASSISTANT: Doree Fudge PA-C   ANESTHESIA:   general  EBL:  Total I/O In: 1000 [I.V.:1000] Out: 250 [Urine:200; Blood:50]  BLOOD ADMINISTERED:none  DRAINS: one Chest Tube(s) in the right pleural space   LOCAL MEDICATIONS USED:  BUPIVICAINE   SPECIMEN:  Source of Specimen:  Right Lung Mass  DISPOSITION OF SPECIMEN:  PATHOLOGY. 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 Await final pathology results.  COUNTS CORRECT:  YES  DICTATION: .Dragon Dictation  PLAN OF CARE: Admit to inpatient   PATIENT DISPOSITION:  PACU - hemodynamically stable.   Delay start of Pharmacological VTE agent (>24hrs) due to surgical blood loss or risk of bleeding: yes

## 2011-06-02 NOTE — OR Nursing (Signed)
Late entery to correct procedure

## 2011-06-02 NOTE — Transfer of Care (Signed)
Immediate Anesthesia Transfer of Care Note  Patient: Andrea Morgan  Procedure(s) Performed: Procedure(s) (LRB): VIDEO BRONCHOSCOPY (N/A) VIDEO ASSISTED THORACOSCOPY (VATS)/WEDGE RESECTION (Right)  Patient Location: PACU  Anesthesia Type: General  Level of Consciousness: awake and patient cooperative  Airway & Oxygen Therapy: Patient Spontanous Breathing and Patient connected to face mask oxygen  Post-op Assessment: Report given to PACU RN, Post -op Vital signs reviewed and stable and Patient moving all extremities X 4  Post vital signs: Reviewed and stable  Complications: No apparent anesthesia complications

## 2011-06-02 NOTE — Anesthesia Procedure Notes (Addendum)
Procedure Name: Intubation Date/Time: 06/02/2011 8:36 AM Performed by: Leona Singleton A. Patient Re-evaluated:Patient Re-evaluated prior to inductionOxygen Delivery Method: Circle System Utilized Preoxygenation: Pre-oxygenation with 100% oxygen Intubation Type: IV induction Ventilation: Mask ventilation without difficulty Laryngoscope Size: Miller and 2 Grade View: Grade III Tube type: Oral Tube size: 8.5 mm Number of attempts: 1 Airway Equipment and Method: stylet Placement Confirmation: ETT inserted through vocal cords under direct vision and breath sounds checked- equal and bilateral Secured at: 23 cm Tube secured with: Tape Dental Injury: Teeth and Oropharynx as per pre-operative assessment     Procedure Name: Intubation Date/Time: 06/02/2011 8:56 AM Performed by: Leona Singleton A. Patient Re-evaluated:Patient Re-evaluated prior to inductionOxygen Delivery Method: Circle System Utilized Preoxygenation: Pre-oxygenation with 100% oxygen Grade View: Grade I Tube type: Oral Endobronchial tube: Double lumen EBT, Left, EBT position confirmed by auscultation and EBT position confirmed by fiberoptic bronchoscope and 37 Fr Number of attempts: 1 Airway Equipment and Method: video-laryngoscopy and bougie stylet Placement Confirmation: ETT inserted through vocal cords under direct vision,  positive ETCO2 and breath sounds checked- equal and bilateral Tube secured with: Tape Dental Injury: Teeth and Oropharynx as per pre-operative assessment  Comments: ETT changed over tube exchanger catheter. 37Fr left DLT placement directly visualized with Glidescope. Placement confirmed with fiberoptic by Dr Chaney Malling.

## 2011-06-02 NOTE — H&P (Signed)
[address]            [place] [address]          [phone]        Andrea Morgan [facility] Medical Record #[id number] Date of Birth: 1950/05/24  Referring: Dr Jasmine Awe Primary Care: [name], MD, MD  Chief Complaint:    Rt lung mass   History of Present Illness:    Patient is a 61 year old smoker who presented at the end of December, 2012 with chest discomfort and projectile vomiting. A CT scan was done while in the emergency room that showed a 10 cm mass involving right lower chest. The patient is referred today for consideration of resection. PET scan and PFTs were done today prior to her visit. Attempts at bronchoscopy were unsuccessful in obtaining a diagnosis. She's been a long-term smoker over 40 years but quit in December.      Current Activity/ Functional Status: Patient is independent with mobility/ambulation, transfers, ADL's, IADL's.   Past Medical History  Diagnosis Date  . Insomnia   . Chronic headaches   . Allergic rhinitis   . Depression   . Depression   . PONV (postoperative nausea and vomiting)   . Shortness of breath     with exercsie  . Cancer   . Anxiety   . GERD (gastroesophageal reflux disease)   . H/O hiatal hernia   . History of kidney stones     Past Surgical History  Procedure Date  . Appendectomy 1978  . Tonsillectomy 1971  . Cervical fusion 1997  . Video bronchoscopy 05/11/2011    Procedure: VIDEO BRONCHOSCOPY WITH FLUORO;  Surgeon: [name], MD;  Location: [facility];  Service: Cardiopulmonary;  Laterality: Bilateral;  . Cervical fusion   . Esophagogastroduodenoscopy 05/21/2011    Procedure: ESOPHAGOGASTRODUODENOSCOPY (EGD);  Surgeon: Theda Belfast, MD;  Location: [facility];  Service: Endoscopy;  Laterality: N/A;    Family History  Problem Relation Age of Onset  . Asthma Mother   . Asthma Maternal Grandmother   . Heart disease Mother   . Lung cancer Father  Died age 20 lung cancer  . Ovarian cancer Maternal Aunt   . Breast cancer Maternal Aunt     x2  . Prostate cancer Maternal Uncle   . Kidney cancer Sister Renal Cell 56    History   Social History  . Marital Status: Divorced    Spouse Name: N/A    Number of Children: N/A  . 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 Smoking status: Former Smoker -- 1.0 packs/day for 45 years    Types: Cigarettes    Quit date: 04/14/2011  . Smokeless tobacco: Not on file  . Alcohol Use: No  . Drug Use: Yes    Special: Marijuana  . Sexually Active: Not on file   Other Topics Concern  . Not on file   Social History Narrative  . No narrative on file    History  Smoking status  . Former Smoker -- 1.0 packs/day for 45 years  . Types: Cigarettes  . Quit date: 04/14/2011  Smokeless tobacco  . Not on file    History  Alcohol Use No     Allergies  Allergen Reactions  .  Sulfa Antibiotics Hives    Current Facility-Administered Medications  Medication Dose Route Frequency Provider Last Rate Last Dose  . cefUROXime (ZINACEF) 1.5 g in dextrose 5 % 50 mL IVPB  1.5 g Intravenous 60 min Pre-Op Delight Ovens, MD      . cefUROXime (ZINACEF) 1.5 g in dextrose 5 % 50 mL IVPB  1.5 g Intravenous 60 min Pre-Op Delight Ovens, MD            Review of Systems:     Cardiac Review of Systems: Y or N  Chest Pain [   y ]  Resting SOB [  n ] Exertional SOB  [  y]  Orthopnea [  ]   Pedal Edema [  n ]    Palpitations [ n ] Syncope  [n  ]   Presyncope [ n  ]  General Review of Systems: [Y] = yes [  ]=no Constitional: recent weight change [y 13 lbs  ]; anorexia [  y]; fatigue [  ]; nausea [ y ]; night sweats [ y]; fever [n  ]; or chills [  ];                                                                                                                                          Dental: poor dentition[ y ]; .  Eye : blurred  vision [  ]; diplopia [   ]; vision changes [  ];  Amaurosis fugax[  ]; Resp: cough [  ];  wheezing[ n ];  hemoptysis[  ]; shortness of breath[  ]; paroxysmal nocturnal dyspnea[  ]; dyspnea on exertion[  ]; or orthopnea[  ];  GI:  gallstones[  ], vomiting[  ];  dysphagia[ y ]; melena[ n ];  hematochezia [ n ]; heartburn[  ];   Hx of  Colonoscopy[  ]; GU: kidney stones [  ]; hematuria[  ];   dysuria [  ];  nocturia[  ];  history of     obstruction [  ];             Skin: rash, swelling[  ];, hair loss[  ];  peripheral edema[  ];  or itching[  ]; Musculosketetal: myalgias[  ];  joint swelling[  ];  joint erythema[  ];  joint pain[  ];  back pain[  ];  Heme/Lymph: bruising[  ];  bleeding[  ];  anemia[  ];  Neuro: TIA[ n ];  headaches[  ];  stroke[ n ];  vertigo[  ];  seizures[  ];   paresthesias[  ];  difficulty walking[  ];  Psych:depression[  ]; anxiety[  ];  Endocrine: diabetes[  ];  thyroid dysfunction[  ];  Immunizations: Flu [ n ]; Pneumococcal[ n ];  Other:  Physical Exam: BP 129/87  Pulse 84  Temp(Src) 98.1 F (36.7 C) (Oral)  Resp 20  SpO2 97%  General appearance: alert, cooperative, appears older than stated age and no distress Neurologic: intact Heart: regular rate and rhythm, S1, S2 normal, no murmur, click, rub or gallop Lungs: clear to auscultation bilaterally Abdomen: soft, non-tender; bowel sounds normal; no masses,  no organomegaly Extremities: extremities normal, atraumatic, no cyanosis or edema, Homans sign is negative, no sign of DVT and no edema, redness or tenderness in the calves or thighs There is no palpable cervical or supraclavicular or axillary adenopathy there is no inguinal  adenopathy   Diagnostic Studies & Laboratory data:     Recent Radiology Findings:   Dg Chest 2 View  06/01/2011  *RADIOLOGY REPORT*  Clinical Data: Preoperative evaluation.  History of lung mass. History of smoking.  CHEST - 2 VIEW  Comparison: 05/11/2011.  CT 05/06/2011.  Findings:  The lung mass in the anterior medial aspect of the lower right lung measuring approximately 8.5 x 8.5 cm in diameter is again evident.  There is the lungs are free of infiltrates or nodules.  Cardiac silhouette is normal size.  There is overall slight hyperinflation configuration.  There is minimal degenerative spondylosis.  No pleural effusion is evident.  IMPRESSION: No significant change in right lung mass.  Slight hyperinflation configuration.  No acute superimposed process.  Original Report Authenticated By: Crawford Givens, M.D.     Ct Chest W Contrast  05/06/2011  *RADIOLOGY REPORT*  Clinical Data: Shortness of breath.  Difficulty swallowing with upper abdominal pain after eating.  Lung cancer.  CT CHEST WITH CONTRAST  Technique:  Multidetector CT imaging of the chest was performed following the standard protocol during bolus administration of intravenous contrast.  Contrast: 80mL OMNIPAQUE IOHEXOL 300 MG/ML IV SOLN  Comparison: CT abdomen pelvis 04/14/2011.  Findings: No pathologically enlarged mediastinal, hilar or axillary lymph nodes. Lymph nodes adjacent to the IVC measure up to 6 mm (image 48).  Centrilobular emphysema.  A heterogeneous and rather vascular mass spans the right middle and right lower lobes, measuring 10.8 x 8.0 x 5.8 cm. There are areas of peripheral calcification.  Adjacent atelectasis is seen in the right middle and right lower lobes.  No pleural fluid.  Airway is unremarkable.  Incidental imaging of the upper abdomen shows a 7 mm low attenuation lesion in the dome of the liver.  Left adrenal nodule measures 2.0 x 1.3 cm and 16 HU.  Gastrohepatic ligament lymph nodes are not enlarged by CT size criteria.  No worrisome lytic or sclerotic lesions.  IMPRESSION:  1.  Heterogeneous, hypervascular and partially calcified mass spanning the right middle and right lower lobes, highly worrisome for primary bronchogenic carcinoma. 2.  Left adrenal nodule is likely an adenoma.  A metastasis cannot  be definitively excluded.  Attention on follow-up exams is warranted. 3.  Sub centimeter low attenuation lesion in the hepatic dome is too small to characterize.  Original Report Authenticated By: Reyes Ivan, M.D.   Nm Pet Image Initial (pi) Skull Base To Thigh  05/26/2011  *RADIOLOGY REPORT*  Clinical Data:  Initial treatment strategy for right lower lobe mass.  NUCLEAR MEDICINE PET CT INITIAL (PI) SKULL BASE TO THIGH  Technique:  19.0 mCi F-18 FDG was injected intravenously via the right antecubital fossa.  Full-ring PET imaging was performed from the skull base through the mid-thighs 64  minutes after injection. CT data was obtained and used for attenuation correction and anatomic localization only.  (This was not acquired as a diagnostic CT examination.)  Fasting Blood Glucose:  94  Patient Weight:  154 pounds.  Comparison:  CT chest from 05/06/2011  Findings: No evidence for unexpected radiotracer uptake in the neck.  The large right lower lobe pulmonary mass shows low level F D G accumulation.  Uptake is essentially at background soft tissue levels with SUV max = 3-4.  No other areas of unexpected or suspicious radiotracer uptake are seen in the chest.  Two areas of F D G uptake identified in the thoracic spine the more prominent of the two localizes to the region of the T4 superior endplate.  The more subtle of the two localizes to the region of the T1-2 interspace.  Review of the CT images shows no soft tissue lesion nor bony abnormality in either location.  There is no abnormal F D G accumulation in the abdomen.  The 17 mm left adrenal nodule has an average attenuation of -12 Hounsfield units on today's study, consistent with benign adrenal adenoma. This nodule shows no F D G uptake above background levels.  No evidence for unexpected or suspicious radiotracer accumulation in the pelvis.  CT data obtained for attenuation correction reveals coronary artery calcification.  There is a tiny (7 mm) low  density lesion in the inferior right liver this shows no evidence for hypermetabolism.  IMPRESSION: Large right lower lobe mass is mildly hypermetabolic with levels just above background soft tissue.  Well differentiated or low grade neoplasm remains within the differential consideration.  Two areas of hypermetabolism identified in the upper thoracic spine.  No underlying CT abnormality is identified to account for this uptake.  Given that the activity appears to localize to the regions of the discs or endplates, it may well be degenerative. There is no destructive bony lesion in either location.  Thoracic MRI without and with contrast could be used to further evaluate, as warranted.  Benign left adrenal adenoma without evidence of hypermetabolism.  Original Report Authenticated By: ERIC A. MANSELL, M.D.   Dg Chest Port 1 View  05/11/2011  *RADIOLOGY REPORT*  Clinical Data: Status post bronchoscopy.  PORTABLE CHEST - 1 VIEW  Comparison: Abdominal CT 04/14/2011 and chest CT 05/06/2011.  Findings: 9.2 cm mass at the right cardiophrenic angle is unchanged.  The mediastinal contours are stable.  There is no pneumothorax or pleural effusion.  The lungs appear clear.  IMPRESSION: No demonstrated complication following bronchoscopy.  Large mass at the right cardiophrenic angle is unchanged.  Original Report Authenticated By: Gerrianne Scale, M.D.   Dg C-arm Bronchoscopy  05/11/2011  CLINICAL DATA: LUNG EXAM   C-ARM BRONCHOSCOPY  Fluoroscopy was utilized by the requesting physician.  No radiographic  interpretation.     Recent Lab Findings: Lab Results  Component Value Date   WBC 5.8 06/01/2011   HGB 12.1 06/01/2011   HCT 36.2 06/01/2011   PLT 245 06/01/2011   GLUCOSE 98 06/01/2011   ALT 15 06/01/2011   AST 19 06/01/2011   NA 140 06/01/2011   K 3.7 06/01/2011   CL 106 06/01/2011   CREATININE 0.67 06/01/2011   BUN 14 06/01/2011   CO2 21 06/01/2011   INR 1.04 06/01/2011   PFTs were done and are adequate for  resection   Assessment / Plan:     10 cm right chest mass, without significant PET uptake. The mass does not appear to involve the esophagus or invade into the pericardium. It does not appear to involve the cava. I reviewed the films with the patient and her sister and have  recommended that we proceed with surgical resection. It is not clear that this is lung cancer and in fact may not be in the lung. The risks and options were discussed with the patient. Her questions have been answered after being told that she has a large lung cancer since late December she is anxious to proceed with treatment. At this point I told her that proceeding with needle biopsy first but not change our course of action as the mass needs to be resected.  The goals risks and alternatives of the planned surgical procedure RT VATS and resection of Chest Mass have been discussed with the patient in detail. The risks of the procedure including death, infection, stroke, myocardial infarction, bleeding, blood transfusion have all been discussed specifically.  I have quoted Andrea Morgan a 4 % of perioperative mortality and a complication rate as high as 20%. The patient's questions have been answered.Andrea Morgan is willing  to proceed with the planned procedure.  The plan to proceed today   Delight Ovens MD  Beeper [phone] Office [phone] 06/02/2011 8:23 AM

## 2011-06-02 NOTE — OR Nursing (Signed)
Video bronch ended @ 09:00, Vats started @ 09:34.

## 2011-06-02 NOTE — Preoperative (Signed)
Beta Blockers   Reason not to administer Beta Blockers:Not Applicable 

## 2011-06-02 NOTE — Progress Notes (Signed)
Patient ID: Andrea Morgan, female   DOB: [birth year], 61 y.o.   MRN: [id number]                                    [address]            [place] [address]          [phone]        SHEREECE WELLBORN [facility] Medical Record #[id number] Date of Birth: 10-05-1950  Referring: Dr Jasmine Awe Primary Care: [name], MD, MD  Chief Complaint:    Rt lung mass   History of Present Illness:    Patient is a 61 year old smoker who presented at the end of December, 2012 with chest discomfort and projectile vomiting. A CT scan was done while in the emergency room that showed a 10 cm mass involving right lower chest. The patient is referred today for consideration of resection. PET scan and PFTs were done today prior to her visit. Attempts at bronchoscopy were unsuccessful in obtaining a diagnosis. She's been a long-term smoker over 40 years but quit in December.      Current Activity/ Functional Status: Patient is independent with mobility/ambulation, transfers, ADL's, IADL's.   Past Medical History  Diagnosis Date  . Insomnia   . Chronic headaches   . Allergic rhinitis   . Depression   . Depression   . PONV (postoperative nausea and vomiting)   . Shortness of breath     with exercsie  . Cancer   . Anxiety   . GERD (gastroesophageal reflux disease)   . H/O hiatal hernia   . History of kidney stones     Past Surgical History  Procedure Date  . Appendectomy 1978  . Tonsillectomy 1971  . Cervical fusion 1997  . Video bronchoscopy 05/11/2011    Procedure: VIDEO BRONCHOSCOPY WITH FLUORO;  Surgeon: [name], MD;  Location: [facility];  Service: Cardiopulmonary;  Laterality: Bilateral;  . Cervical fusion   . Esophagogastroduodenoscopy 05/21/2011    Procedure: ESOPHAGOGASTRODUODENOSCOPY (EGD);  Surgeon: Theda Belfast, MD;  Location: [facility];  Service: Endoscopy;  Laterality: N/A;    Family History  Problem Relation Age of Onset  . Asthma  Mother   . Asthma Maternal Grandmother   . Heart disease Mother   . Lung cancer Father Died age 23 lung cancer  . Ovarian cancer Maternal Aunt   . Breast cancer Maternal Aunt     x2  . Prostate cancer Maternal Uncle   . Kidney cancer Sister Renal Cell 50    History   Social History  . Marital Status: Divorced    Spouse Name: N/A    Number of Children: N/A  . 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 Smoking status: Former Smoker -- 1.0 packs/day for 45 years    Types: Cigarettes    Quit date: 04/14/2011  . Smokeless tobacco: Not on file  . Alcohol Use: No  . Drug Use: Yes    Special: Marijuana  . Sexually Active: Not on file   Other Topics Concern  . Not on file   Social History Narrative  . No narrative on file    History  Smoking status  . Former Smoker -- 1.0 packs/day for 45 years  . Types: Cigarettes  . Quit  date: 04/14/2011  Smokeless tobacco  . Not on file    History  Alcohol Use No     Allergies  Allergen Reactions  . Sulfa Antibiotics Hives    Current Facility-Administered Medications  Medication Dose Route Frequency Provider Last Rate Last Dose  . cefUROXime (ZINACEF) 1.5 g in dextrose 5 % 50 mL IVPB  1.5 g Intravenous 60 min Pre-Op Delight Ovens, MD      . cefUROXime (ZINACEF) 1.5 g in dextrose 5 % 50 mL IVPB  1.5 g Intravenous 60 min Pre-Op Delight Ovens, MD            Review of Systems:     Cardiac Review of Systems: Y or N  Chest Pain [   y ]  Resting SOB [  n ] Exertional SOB  [  y]  Orthopnea [  ]   Pedal Edema [  n ]    Palpitations [ n ] Syncope  [n  ]   Presyncope [ n  ]  General Review of Systems: [Y] = yes [  ]=no Constitional: recent weight change [y 13 lbs  ]; anorexia [  y]; fatigue [  ]; nausea [ y ]; night sweats [ y]; fever [n  ]; or chills [  ];                                                                                                                                           Dental: poor dentition[ y ]; .  Eye : blurred vision [  ]; diplopia [   ]; vision changes [  ];  Amaurosis fugax[  ]; Resp: cough [  ];  wheezing[ n ];  hemoptysis[  ]; shortness of breath[  ]; paroxysmal nocturnal dyspnea[  ]; dyspnea on exertion[  ]; or orthopnea[  ];  GI:  gallstones[  ], vomiting[  ];  dysphagia[ y ]; melena[ n ];  hematochezia [ n ]; heartburn[  ];   Hx of  Colonoscopy[  ]; GU: kidney stones [  ]; hematuria[  ];   dysuria [  ];  nocturia[  ];  history of     obstruction [  ];             Skin: rash, swelling[  ];, hair loss[  ];  peripheral edema[  ];  or itching[  ]; Musculosketetal: myalgias[  ];  joint swelling[  ];  joint erythema[  ];  joint pain[  ];  back pain[  ];  Heme/Lymph: bruising[  ];  bleeding[  ];  anemia[  ];  Neuro: TIA[ n ];  headaches[  ];  stroke[ n ];  vertigo[  ];  seizures[  ];   paresthesias[  ];  difficulty walking[  ];  Psych:depression[  ]; anxiety[  ];  Endocrine: diabetes[  ];  thyroid dysfunction[  ];  Immunizations: Flu [ n ]; Pneumococcal[ n ];  Other:  Physical Exam: BP 129/87  Pulse 84  Temp(Src) 98.1 F (36.7 C) (Oral)  Resp 20  SpO2 97%  General appearance: alert, cooperative, appears older than stated age and no distress Neurologic: intact Heart: regular rate and rhythm, S1, S2 normal, no murmur, click, rub or gallop Lungs: clear to auscultation bilaterally Abdomen: soft, non-tender; bowel sounds normal; no masses,  no organomegaly Extremities: extremities normal, atraumatic, no cyanosis or edema, Homans sign is negative, no sign of DVT and no edema, redness or tenderness in the calves or thighs There is no palpable cervical or supraclavicular or axillary adenopathy there is no inguinal  adenopathy   Diagnostic Studies & Laboratory data:     Recent Radiology Findings:   Dg Chest 2 View  06/01/2011  *RADIOLOGY REPORT*  Clinical Data: Preoperative evaluation.  History of lung mass.  History of smoking.  CHEST - 2 VIEW  Comparison: 05/11/2011.  CT 05/06/2011.  Findings: The lung mass in the anterior medial aspect of the lower right lung measuring approximately 8.5 x 8.5 cm in diameter is again evident.  There is the lungs are free of infiltrates or nodules.  Cardiac silhouette is normal size.  There is overall slight hyperinflation configuration.  There is minimal degenerative spondylosis.  No pleural effusion is evident.  IMPRESSION: No significant change in right lung mass.  Slight hyperinflation configuration.  No acute superimposed process.  Original Report Authenticated By: Crawford Givens, M.D.     Ct Chest W Contrast  05/06/2011  *RADIOLOGY REPORT*  Clinical Data: Shortness of breath.  Difficulty swallowing with upper abdominal pain after eating.  Lung cancer.  CT CHEST WITH CONTRAST  Technique:  Multidetector CT imaging of the chest was performed following the standard protocol during bolus administration of intravenous contrast.  Contrast: 80mL OMNIPAQUE IOHEXOL 300 MG/ML IV SOLN  Comparison: CT abdomen pelvis 04/14/2011.  Findings: No pathologically enlarged mediastinal, hilar or axillary lymph nodes. Lymph nodes adjacent to the IVC measure up to 6 mm (image 48).  Centrilobular emphysema.  A heterogeneous and rather vascular mass spans the right middle and right lower lobes, measuring 10.8 x 8.0 x 5.8 cm. There are areas of peripheral calcification.  Adjacent atelectasis is seen in the right middle and right lower lobes.  No pleural fluid.  Airway is unremarkable.  Incidental imaging of the upper abdomen shows a 7 mm low attenuation lesion in the dome of the liver.  Left adrenal nodule measures 2.0 x 1.3 cm and 16 HU.  Gastrohepatic ligament lymph nodes are not enlarged by CT size criteria.  No worrisome lytic or sclerotic lesions.  IMPRESSION:  1.  Heterogeneous, hypervascular and partially calcified mass spanning the right middle and right lower lobes, highly worrisome for primary  bronchogenic carcinoma. 2.  Left adrenal nodule is likely an adenoma.  A metastasis cannot be definitively excluded.  Attention on follow-up exams is warranted. 3.  Sub centimeter low attenuation lesion in the hepatic dome is too small to characterize.  Original Report Authenticated By: Reyes Ivan, M.D.   Nm Pet Image Initial (pi) Skull Base To Thigh  05/26/2011  *RADIOLOGY REPORT*  Clinical Data:  Initial treatment strategy for right lower lobe mass.  NUCLEAR MEDICINE PET CT INITIAL (PI) SKULL BASE TO THIGH  Technique:  19.0 mCi F-18 FDG was injected intravenously via the right antecubital fossa.  Full-ring PET imaging was performed from the skull base through the mid-thighs 64  minutes  after injection. CT data was obtained and used for attenuation correction and anatomic localization only.  (This was not acquired as a diagnostic CT examination.)  Fasting Blood Glucose:  94  Patient Weight:  154 pounds.  Comparison:  CT chest from 05/06/2011  Findings: No evidence for unexpected radiotracer uptake in the neck.  The large right lower lobe pulmonary mass shows low level F D G accumulation.  Uptake is essentially at background soft tissue levels with SUV max = 3-4.  No other areas of unexpected or suspicious radiotracer uptake are seen in the chest.  Two areas of F D G uptake identified in the thoracic spine the more prominent of the two localizes to the region of the T4 superior endplate.  The more subtle of the two localizes to the region of the T1-2 interspace.  Review of the CT images shows no soft tissue lesion nor bony abnormality in either location.  There is no abnormal F D G accumulation in the abdomen.  The 17 mm left adrenal nodule has an average attenuation of -12 Hounsfield units on today's study, consistent with benign adrenal adenoma. This nodule shows no F D G uptake above background levels.  No evidence for unexpected or suspicious radiotracer accumulation in the pelvis.  CT data obtained for  attenuation correction reveals coronary artery calcification.  There is a tiny (7 mm) low density lesion in the inferior right liver this shows no evidence for hypermetabolism.  IMPRESSION: Large right lower lobe mass is mildly hypermetabolic with levels just above background soft tissue.  Well differentiated or low grade neoplasm remains within the differential consideration.  Two areas of hypermetabolism identified in the upper thoracic spine.  No underlying CT abnormality is identified to account for this uptake.  Given that the activity appears to localize to the regions of the discs or endplates, it may well be degenerative. There is no destructive bony lesion in either location.  Thoracic MRI without and with contrast could be used to further evaluate, as warranted.  Benign left adrenal adenoma without evidence of hypermetabolism.  Original Report Authenticated By: ERIC A. MANSELL, M.D.   Dg Chest Port 1 View  05/11/2011  *RADIOLOGY REPORT*  Clinical Data: Status post bronchoscopy.  PORTABLE CHEST - 1 VIEW  Comparison: Abdominal CT 04/14/2011 and chest CT 05/06/2011.  Findings: 9.2 cm mass at the right cardiophrenic angle is unchanged.  The mediastinal contours are stable.  There is no pneumothorax or pleural effusion.  The lungs appear clear.  IMPRESSION: No demonstrated complication following bronchoscopy.  Large mass at the right cardiophrenic angle is unchanged.  Original Report Authenticated By: Gerrianne Scale, M.D.   Dg C-arm Bronchoscopy  05/11/2011  CLINICAL DATA: LUNG EXAM   C-ARM BRONCHOSCOPY  Fluoroscopy was utilized by the requesting physician.  No radiographic  interpretation.     Recent Lab Findings: Lab Results  Component Value Date   WBC 5.8 06/01/2011   HGB 12.1 06/01/2011   HCT 36.2 06/01/2011   PLT 245 06/01/2011   GLUCOSE 98 06/01/2011   ALT 15 06/01/2011   AST 19 06/01/2011   NA 140 06/01/2011   K 3.7 06/01/2011   CL 106 06/01/2011   CREATININE 0.67 06/01/2011   BUN 14  06/01/2011   CO2 21 06/01/2011   INR 1.04 06/01/2011   PFTs were done and are adequate for resection   Assessment / Plan:     10 cm right chest mass, without significant PET uptake. The mass does not  appear to involve the esophagus or invade into the pericardium. It does not appear to involve the cava. I reviewed the films with the patient and her sister and have recommended that we proceed with surgical resection. It is not clear that this is lung cancer and in fact may not be in the lung. The risks and options were discussed with the patient. Her questions have been answered after being told that she has a large lung cancer since late December she is anxious to proceed with treatment. At this point I told her that proceeding with needle biopsy first but not change our course of action as the mass needs to be resected.  The goals risks and alternatives of the planned surgical procedure RT VATS and resection of Chest Mass have been discussed with the patient in detail. The risks of the procedure including death, infection, stroke, myocardial infarction, bleeding, blood transfusion have all been discussed specifically.  I have quoted Andrea Morgan a 4 % of perioperative mortality and a complication rate as high as 20%. The patient's questions have been answered.Andrea Morgan is willing  to proceed with the planned procedure.  The plan to proceed today   Delight Ovens MD  Beeper [phone] Office [phone] 06/02/2011 8:23 AM

## 2011-06-03 ENCOUNTER — Inpatient Hospital Stay (HOSPITAL_COMMUNITY): Payer: Self-pay

## 2011-06-03 ENCOUNTER — Encounter (HOSPITAL_COMMUNITY): Payer: Self-pay | Admitting: Cardiothoracic Surgery

## 2011-06-03 LAB — CBC
Platelets: 258 10*3/uL (ref 150–400)
RBC: 3.99 MIL/uL (ref 3.87–5.11)
WBC: 9.3 10*3/uL (ref 4.0–10.5)

## 2011-06-03 LAB — BLOOD GAS, ARTERIAL
Acid-Base Excess: 1.4 mmol/L (ref 0.0–2.0)
Drawn by: 143801
O2 Content: 2 L/min
pCO2 arterial: 42.6 mmHg (ref 35.0–45.0)
pH, Arterial: 7.399 (ref 7.350–7.400)

## 2011-06-03 LAB — BASIC METABOLIC PANEL
Calcium: 8.3 mg/dL — ABNORMAL LOW (ref 8.4–10.5)
Creatinine, Ser: 0.54 mg/dL (ref 0.50–1.10)
GFR calc non Af Amer: >90 mL/min (ref >90)
Glucose, Bld: 98 mg/dL (ref 70–99)
Sodium: 137 mEq/L (ref 135–145)

## 2011-06-03 MED ORDER — SODIUM CHLORIDE 0.9 % IJ SOLN
INTRAMUSCULAR | Status: AC
  Administered 2011-06-03: 10 mL
  Filled 2011-06-03: qty 20

## 2011-06-03 MED ORDER — POTASSIUM CHLORIDE IN NACL 20-0.9 MEQ/L-% IV SOLN
INTRAVENOUS | Status: DC
  Administered 2011-06-03: 1000 mL via INTRAVENOUS
  Filled 2011-06-03: qty 1000

## 2011-06-03 NOTE — Progress Notes (Addendum)
[address]            [place] [address]          [phone]     1 Day Post-Op Procedure(s) (LRB): VIDEO BRONCHOSCOPY (N/A) EXCISION MASS () VIDEO ASSISTED THORACOSCOPY (VATS)/THOROCOTOMY ()  Subjective: OOB to chair.  Walked already this am.  "My goal is to be home by Saturday."  Objective: Vital signs in last 24 hours: Patient Vitals for the past 24 hrs:  BP Temp Temp src Pulse Resp SpO2 Height Weight  06/03/11 0414 138/60 mmHg 98 F (36.7 C) Oral 75  15  98 % - -  06/03/11 0402 - - - - 15  98 % - -  06/03/11 0031 - - - - 13  96 % - -  06/02/11 2358 - - - - 13  96 % - -  06/02/11 2347 145/69 mmHg 98.1 F (36.7 C) Oral 79  13  96 % - -  06/02/11 2019 144/72 mmHg 97.9 F (36.6 C) Oral 81  13  96 % - -  06/02/11 2016 - - - - 16  97 % - -  06/02/11 1827 158/72 mmHg 98.2 F (36.8 C) Oral 106  15  95 % 5\' 6"  (1.676 m) 72.8 kg (160 lb 7.9 oz)  06/02/11 1515 - 97.3 F (36.3 C) - 106  25  95 % - -  06/02/11 1504 144/71 mmHg - - 100  16  95 % - -  06/02/11 1500 - - - 104  15  94 % - -  06/02/11 1449 141/68 mmHg - - 99  16  96 % - -  06/02/11 1445 - - - 99  15  95 % - -  06/02/11 1433 147/66 mmHg - - 98  20  96 % - -  06/02/11 1430 - - - 97  15  95 % - -  06/02/11 1419 136/41 mmHg - - 103  13  95 % - -  06/02/11 1415 - 97.4 F (36.3 C) - 101  16  95 % - -  06/02/11 1404 144/68 mmHg - - 97  16  94 % - -  06/02/11 1400 - - - 101  16  96 % - -  06/02/11 1349 145/68 mmHg - - 98  17  96 % - -  06/02/11 1345 - - - 98  15  95 % - -  06/02/11 1334 147/62 mmHg - - 94  17  96 % - -  06/02/11 1330 - - - 98  17  95 % - -  06/02/11 1319 144/66 mmHg - - 96  16  95 % - -  06/02/11 1317 - - - - 15  95 % - -  06/02/11 1315 - - - 94  17  95 % - -  06/02/11 1303 143/67 mmHg - - 94  17  95 % - -  06/02/11 1300 - - - 96  17  95 % - -  06/02/11 1249 144/64 mmHg - - 93  16  96 % - -  06/02/11 1245 - - - 97  17  96 % - -  06/02/11 1234 144/68 mmHg - - 94   15  96 % - -  06/02/11 1230 - - - 96  15  95 % - -  06/02/11 1219 146/68 mmHg - -  100  17  95 % - -  06/02/11 1215 - - - 99  17  97 % - -  06/02/11 1203 146/68 mmHg - - 101  15  100 % - -  06/02/11 1200 - - - 98  15  100 % - -  06/02/11 1148 142/61 mmHg - - 97  14  100 % - -  06/02/11 1145 - - - 95  14  100 % - -  06/02/11 1133 144/62 mmHg - - 97  14  98 % - -  06/02/11 1130 - - - 100  14  99 % - -  06/02/11 1118 131/69 mmHg - - 96  19  92 % - -  06/02/11 1115 - - - 93  18  88 % - -  06/02/11 1113 - 96.9 F (36.1 C) - 84  20  74 % - -   Current Weight  06/02/11 72.8 kg (160 lb 7.9 oz)     Intake/Output from previous day: 02/13 0701 - 02/14 0700 In: 2600 [I.V.:2400; IV Piggyback:200] Out: 3645 [Urine:3525; Blood:50; Chest Tube:70]    PHYSICAL EXAM:  Heart: RRR Lungs: few coarse BS which clear with cough Wound: dressed and dry Chest tube: no air leak  Lab Results: CBC: Basename 06/03/11 0410 06/01/11 1526  WBC 9.3 5.8  HGB 11.5* 12.1  HCT 33.6* 36.2  PLT 258 245   BMET:  Basename 06/03/11 0410 06/01/11 1526  NA 137 140  K 3.8 3.7  CL 105 106  CO2 24 21  GLUCOSE 98 98  BUN 5* 14  CREATININE 0.54 0.67  CALCIUM 8.3* 9.0    PT/INR:  Basename 06/01/11 1526  LABPROT 13.8  INR 1.04   CXR: stable, ?tiny Rt apical ptx  Assessment/Plan: S/P Procedure(s) (LRB): VIDEO BRONCHOSCOPY (N/A) EXCISION MASS () VIDEO ASSISTED THORACOSCOPY (VATS)/THOROCOTOMY () Doing well, POD#1.   Will decrease IVF, place CT to H2O seal. Continue pulm toilet/IS, wean O2 (on 2L), ambulate.   LOS: 1 day    COLLINS,GINA H 06/03/2011   doing well post op Ct to water seal prob remove tomorrow Goal home sat Final path pending  I have seen and examined Andrea Morgan and agree with the above assessment  and plan.  Delight Ovens MD Beeper [phone] Office [phone] 06/03/2011 11:01 AM

## 2011-06-04 ENCOUNTER — Inpatient Hospital Stay (HOSPITAL_COMMUNITY): Payer: Self-pay

## 2011-06-04 LAB — COMPREHENSIVE METABOLIC PANEL
ALT: 15 U/L (ref 0–35)
AST: 18 U/L (ref 0–37)
Albumin: 3.1 g/dL — ABNORMAL LOW (ref 3.5–5.2)
CO2: 29 mEq/L (ref 19–32)
Chloride: 101 mEq/L (ref 96–112)
GFR calc non Af Amer: >90 mL/min (ref >90)
Sodium: 138 mEq/L (ref 135–145)
Total Bilirubin: 0.4 mg/dL (ref 0.3–1.2)

## 2011-06-04 LAB — CBC
Platelets: 267 10*3/uL (ref 150–400)
RBC: 4.16 MIL/uL (ref 3.87–5.11)
RDW: 14.6 % (ref 11.5–15.5)
WBC: 9.5 10*3/uL (ref 4.0–10.5)

## 2011-06-04 MED ORDER — OXYCODONE-ACETAMINOPHEN 5-325 MG PO TABS: 1.0000 | ORAL_TABLET | ORAL | Status: DC | PRN

## 2011-06-04 MED ORDER — POTASSIUM CHLORIDE CRYS ER 20 MEQ PO TBCR
40.0000 meq | EXTENDED_RELEASE_TABLET | Freq: Once | ORAL | Status: AC
  Administered 2011-06-04: 40 meq via ORAL
  Filled 2011-06-04: qty 2

## 2011-06-04 MED ORDER — SODIUM CHLORIDE 0.9 % IJ SOLN
INTRAMUSCULAR | Status: AC
  Filled 2011-06-04: qty 10

## 2011-06-04 NOTE — Progress Notes (Signed)
UR Completed.  Andrea Morgan [phone] 06/04/2011  

## 2011-06-04 NOTE — Discharge Summary (Signed)
[address]            [place] [address]          [phone]         Discharge Summary  Name: Andrea Morgan DOB: 1950-08-08 61 y.o. MRN: [id number]  Admission Date: 06/02/2011 Discharge Date:    Admitting Diagnosis:  Right chest mass  Discharge Diagnosis:   Right mediastinal mass  Tobacco use  Allergic rhinitis  Gastroesophageal reflux disease  Hiatal hernia  Depression  History of kidney stones  Procedures: Procedure(s): VIDEO BRONCHOSCOPY RIGHT VIDEO ASSISTED THORACOSCOPY (VATS), EXCISION OF MEDIASTINAL MASS on 06/02/2011   HPI:  The patient is a 61 y.o. female who presented at the end of December, 2012 with chest discomfort and projectile vomiting. A CT scan was done while in the emergency room that showed a 10 cm mass involving right lower chest. She was seen by Dr. Delton Coombes, and attempts at bronchoscopy were unsuccessful in obtaining a diagnosis. PET scan did not show increased uptake. The patient was referred to Dr. Tyrone Sage for consideration of resection. The mass did not appear to involve the esophagus or invade into the pericardium.  He recommended surgical resection for diagnosis. All risks, benefits and alternatives of surgery were explained in detail, and the patient agreed to proceed.    Hospital Course:  The patient was admitted to [facility] on 06/02/2011. The patient was taken to the operating room and underwent the above procedure.  Intraoperatively it was noted that the mass was mediastinal in nature and did not involve the lung. 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  The postoperative course has generally been uneventful. Her chest tubes have been removed in the standard fashion. She has remained afebrile and her vital signs are stable. She is ambulating in the halls without difficulty and tolerating a regular diet. Her final pathology remains pending at the time of this  dictation. We anticipate discharge home within the next 24-48 hours provided her chest x-ray remains stable and no other acute changes occur.   Recent vital signs:  Filed Vitals:   06/04/11 1140  BP:   Pulse:   Temp: 98.5 F (36.9 C)  Resp:     Recent laboratory studies:  CBC: Basename 06/04/11 0530 06/03/11 0410  WBC 9.5 9.3  HGB 12.2 11.5*  HCT 35.7* 33.6*  PLT 267 258   BMET:  Basename 06/04/11 0530 06/03/11 0410  NA 138 137  K 3.3* 3.8  CL 101 105  CO2 29 24  GLUCOSE 106* 98  BUN 7 5*  CREATININE 0.65 0.54  CALCIUM 8.9 8.3*    PT/INR:  Basename 06/01/11 1526  LABPROT 13.8  INR 1.04    Discharge Medications:   Medication List  As of 06/04/2011  1:22 PM   TAKE these medications         DULoxetine 30 MG capsule   Commonly known as: CYMBALTA   Take 60 mg by mouth every morning.      gabapentin 300 MG capsule   Commonly known as: NEURONTIN   Take 300 mg by mouth 3 (three) times daily.      oxyCODONE-acetaminophen 5-325 MG per tablet   Commonly known as: PERCOCET   Take 1-2 tablets by mouth every 4 (four) hours as needed for pain. For pain      PRILOSEC 40 MG capsule  Generic drug: omeprazole   Take 40 mg by mouth daily.      zolpidem 10 MG tablet   Commonly known as: AMBIEN   Take 10 mg by mouth at bedtime as needed. For sleep              Discharge Instructions:  The patient is to refrain from driving, heavy lifting or strenuous activity.  May shower daily and clean incisions with soap and water.  May resume regular diet.  Discharge Orders    Future Appointments: Provider: Department: Dept Phone: Center:   06/07/2011 3:00 PM [name], MD [facility] [phone] None   07/01/2011 1:30 PM Delight Ovens, MD [facility] [phone] [facility]      Follow-up Information    Follow up with Delight Ovens, MD on 07/01/2011. (Have a chest x-ray at 12:30, then see MD at 1:30)    Contact information:   [address]  [address] [place] [address] [phone]           Adella Hare 06/04/2011, 1:22 PM

## 2011-06-04 NOTE — Progress Notes (Addendum)
                    [address]            [place] [address]          [phone]     2 Days Post-Op Procedure(s) (LRB): VIDEO BRONCHOSCOPY (N/A) EXCISION MASS () VIDEO ASSISTED THORACOSCOPY (VATS)/THOROCOTOMY ()  Subjective: Still sore ,but feels well.  Breathing stable.  Objective: Vital signs in last 24 hours: Patient Vitals for the past 24 hrs:  BP Temp Temp src Pulse Resp SpO2  06/04/11 0430 143/77 mmHg 97.8 F (36.6 C) Oral 85  15  96 %  06/04/11 0400 - - - - 14  96 %  06/04/11 0038 142/71 mmHg 98.7 F (37.1 C) Oral 88  15  95 %  06/04/11 0037 - - - - 18  96 %  06/03/11 2100 - - - - 17  90 %  06/03/11 1910 107/61 mmHg 98.8 F (37.1 C) Oral 88  16  90 %  06/03/11 1505 - 98.3 F (36.8 C) Oral - - -  06/03/11 1130 - 97.8 F (36.6 C) Oral - - -   Current Weight  06/02/11 72.8 kg (160 lb 7.9 oz)     Intake/Output from previous day: 02/14 0701 - 02/15 0700 In: 520 [P.O.:240; I.V.:280] Out: 540 [Urine:450; Chest Tube:90]   PHYSICAL EXAM:  Heart: RRR Lungs:clear Wound: dressed and dry Chest tube: No air leak  Lab Results: CBC: Basename 06/04/11 0530 06/03/11 0410  WBC 9.5 9.3  HGB 12.2 11.5*  HCT 35.7* 33.6*  PLT 267 258   BMET:  Basename 06/04/11 0530 06/03/11 0410  NA 138 137  K 3.3* 3.8  CL 101 105  CO2 29 24  GLUCOSE 106* 98  BUN 7 5*  CREATININE 0.65 0.54  CALCIUM 8.9 8.3*    PT/INR:  Basename 06/01/11 1526  LABPROT 13.8  INR 1.04   CXR: slight increase in R apical ptx  Assessment/Plan: S/P Procedure(s) (LRB): VIDEO BRONCHOSCOPY (N/A) EXCISION MASS () VIDEO ASSISTED THORACOSCOPY (VATS)/THOROCOTOMY () CT no air leak, but slightly increased apical ptx on CXR.  Will leave CT to H2O seal for now. Continue pulm toilet, ambulation. Path pending. Hypokalemia- replace K+   LOS: 2 days    COLLINS,GINA H 06/04/2011   I have seen and examined the patient and agree with the assessment and plan as  outlined.  Purcell Nails 06/04/2011 3:43 PM

## 2011-06-05 ENCOUNTER — Inpatient Hospital Stay (HOSPITAL_COMMUNITY): Payer: Self-pay

## 2011-06-05 NOTE — Progress Notes (Signed)
Patient was discharged home with family, per MD order. All discharger instructions were reviewed and all questions answered.

## 2011-06-05 NOTE — Progress Notes (Signed)
                    [address]            [place] [address]          [phone]     3 Days Post-Op Procedure(s) (LRB): VIDEO BRONCHOSCOPY (N/A) EXCISION MASS () VIDEO ASSISTED THORACOSCOPY (VATS)/THOROCOTOMY ()  Subjective: Feels well.  Walked in halls earlier.  No complaints.  Objective: Vital signs in last 24 hours: Patient Vitals for the past 24 hrs:  BP Temp Temp src Pulse Resp SpO2  06/05/11 0356 120/70 mmHg 98.6 F (37 C) Oral 94  - 91 %  06/05/11 0000 113/66 mmHg 98.9 F (37.2 C) Oral 90  15  91 %  06/04/11 1930 140/64 mmHg 98 F (36.7 C) Oral 103  14  94 %  06/04/11 1451 - 98 F (36.7 C) Oral - - -  06/04/11 1140 - 98.5 F (36.9 C) Oral - - -   Current Weight  06/02/11 72.8 kg (160 lb 7.9 oz)     Intake/Output from previous day:      PHYSICAL EXAM:  Heart: RRR Lungs: clear Wound: clean and dry   Lab Results: CBC: Basename 06/04/11 0530 06/03/11 0410  WBC 9.5 9.3  HGB 12.2 11.5*  HCT 35.7* 33.6*  PLT 267 258   BMET:  Basename 06/04/11 0530 06/03/11 0410  NA 138 137  K 3.3* 3.8  CL 101 105  CO2 29 24  GLUCOSE 106* 98  BUN 7 5*  CREATININE 0.65 0.54  CALCIUM 8.9 8.3*    PT/INR: No results found for this basename: LABPROT,INR in the last 72 hours  Path: solitary fibrous tumor  CXR: no obvious ptx  Assessment/Plan: S/P Procedure(s) (LRB): VIDEO BRONCHOSCOPY (N/A) EXCISION MASS () VIDEO ASSISTED THORACOSCOPY (VATS)/THOROCOTOMY () Stable, progressing well.  Plan home later this am.   LOS: 3 days    Everrett Lacasse H 06/05/2011

## 2011-06-06 NOTE — Op Note (Signed)
NAMEELESE, RANE NO.:  [id number]  MEDICAL RECORD NO.:  [id number]  LOCATION:  3307                         FACILITY:  [facility]  PHYSICIAN:  Sheliah Plane, MD    DATE OF BIRTH:  23-Jan-1951  DATE OF PROCEDURE:  06/02/2011 DATE OF DISCHARGE:  06/05/2011                              OPERATIVE REPORT   PREOPERATIVE DIAGNOSIS:  Right chest mass.  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  PROCEDURE PERFORMED:  Bronchoscopy, right video-assisted thoracoscopy, left minithoracotomy and resection of 10-cm spindle cell tumor involving the right chest.  SURGEON:  Sheliah Plane, MD  ASSISTANT:  Doree Fudge, PA  BRIEF HISTORY:  The patient is a 61 year old female with previous smoking history who had presented in December with projectile vomiting and abdominal discomfort to the emergency room.  A chest x-ray and subsequently a CT scan was performed, which showed no cause of her vomiting, but demonstrated a 10-cm mass in the right chest.  She was evaluated by Pulmonary and ultimately referred to Thoracic Surgery for consideration of resection.  The mass was 10 cm in size, but did not appear to be invading mediastinal structures.  The PET scan showed no uptake in the tumor or areas of calcification __________ mass.  Surgical resection was recommended to the patient.  Risks and options were discussed in detail, and the patient was willing to proceed.  DESCRIPTION OF PROCEDURE:  With arterial line in place, the patient underwent general endotracheal anesthesia with a double-lumen endotracheal tube.  Video bronchoscopy was carried out to the subsegmental level with no obvious endobronchial lesion, and she had good position of the double-lumen endotracheal tube.  The scope was removed.  The patient was then turned to the lateral decubitus position with the right side up.  The laterality of the procedure was confirmed. The skin of  the chest was prepped with Betadine and draped in usual sterile manner.  In the fifth intercostal space mid axillary line, a small incision was made and a 30-degree videoscope was introduced into the chest.  There has __________ collapse of the lung __________.  The mass structure was easily identified, not invading the lung.  In addition, there were large venous varicosities present along the mediastinum and along the right phrenic nerve.  The mass was suspended with 2 vascular __________, but without obvious invasion to any of the surrounding structures or to the lung.  A second port site was made slightly posterior through these 2 port sites, the vascular pedicles were divided.  A small thoracotomy incision was made to allow removal of the 10-cm __________, was consisted with spindle cell tumor.  As noted, the tumor was well encapsulated.  At the completion of the procedure, there was no obvious bleeding.  Through one port site, a 28 chest tube was left and the incisions __________ was then closed with single pericostal stitch interrupted 0 Vicryl in the fascial layers and running 2-0 Vicryl subcutaneous tissue and 4-0 subcuticular stitch to the skin edges.  At the completion of the procedure, the lung was reinflated.  There was no air leak.  Sponge and needle count was reported as correct.  The patient  was awakened and extubated in the operating room and transferred to the recovery room for further postoperative care, having tolerated the procedure without obvious complications.  Blood loss was minimal.     Sheliah Plane, MD     EG/MEDQ  D:  06/05/2011  T:  06/06/2011  Job:  [id number]

## 2011-06-07 ENCOUNTER — Ambulatory Visit: Payer: Self-pay | Admitting: Emergency Medicine

## 2011-06-10 ENCOUNTER — Other Ambulatory Visit: Payer: Self-pay | Admitting: *Deleted

## 2011-06-10 DIAGNOSIS — G8918 Other acute postprocedural pain: Secondary | ICD-10-CM

## 2011-06-10 MED ORDER — OXYCODONE-ACETAMINOPHEN 5-325 MG PO TABS: 1.0000 | ORAL_TABLET | ORAL | Status: DC | PRN

## 2011-06-21 ENCOUNTER — Other Ambulatory Visit: Payer: Self-pay | Admitting: *Deleted

## 2011-06-21 DIAGNOSIS — G8918 Other acute postprocedural pain: Secondary | ICD-10-CM

## 2011-06-21 MED ORDER — HYDROCODONE-ACETAMINOPHEN 7.5-500 MG PO TABS: 1.0000 | ORAL_TABLET | Freq: Four times a day (QID) | ORAL | Status: DC | PRN

## 2011-06-25 ENCOUNTER — Other Ambulatory Visit: Payer: Self-pay | Admitting: Cardiothoracic Surgery

## 2011-06-25 DIAGNOSIS — D381 Neoplasm of uncertain behavior of trachea, bronchus and lung: Secondary | ICD-10-CM

## 2011-07-01 ENCOUNTER — Ambulatory Visit
Admission: RE | Admit: 2011-07-01 | Discharge: 2011-07-01 | Disposition: A | Discharge status: Home / Self Care | Payer: Self-pay | Source: Ambulatory Visit | Attending: Cardiothoracic Surgery | Admitting: Cardiothoracic Surgery

## 2011-07-01 ENCOUNTER — Encounter: Payer: Self-pay | Admitting: Cardiothoracic Surgery

## 2011-07-01 ENCOUNTER — Ambulatory Visit (INDEPENDENT_AMBULATORY_CARE_PROVIDER_SITE_OTHER): Payer: Self-pay | Admitting: Cardiothoracic Surgery

## 2011-07-01 VITALS — BP 120/70 | HR 88 | Resp 16 | Ht 66.0 in | Wt 154.0 lb

## 2011-07-01 DIAGNOSIS — Z09 Encounter for follow-up examination after completed treatment for conditions other than malignant neoplasm: Secondary | ICD-10-CM

## 2011-07-01 DIAGNOSIS — G8918 Other acute postprocedural pain: Secondary | ICD-10-CM

## 2011-07-01 DIAGNOSIS — C761 Malignant neoplasm of thorax: Secondary | ICD-10-CM

## 2011-07-01 DIAGNOSIS — D381 Neoplasm of uncertain behavior of trachea, bronchus and lung: Secondary | ICD-10-CM

## 2011-07-01 DIAGNOSIS — D491 Neoplasm of unspecified behavior of respiratory system: Secondary | ICD-10-CM

## 2011-07-01 MED ORDER — HYDROCODONE-ACETAMINOPHEN 7.5-500 MG PO TABS: 1.0000 | ORAL_TABLET | Freq: Four times a day (QID) | ORAL | Status: AC | PRN

## 2011-07-01 MED ORDER — HYDROCODONE-ACETAMINOPHEN 7.5-500 MG PO TABS: 1.0000 | ORAL_TABLET | Freq: Four times a day (QID) | ORAL | Status: DC | PRN

## 2011-07-01 NOTE — Progress Notes (Signed)
[address]            [place] [address]          [phone]       Andrea Morgan [facility] Medical Record #[id number] Date of Birth: [birth year]  Andrea Peer, MD [name], MD, MD  Chief Complaint:   PostOp Follow Up Visit PROCEDURE PERFORMED: Bronchoscopy, right video-assisted thoracoscopy,  left minithoracotomy and resection of 10-cm spindle cell tumor involving  the right chest.    History of Present Illness:      Doing well post op, gaining strength, no fever or chills. NOT SMOKING     History  Smoking status  . Former Smoker -- 1.0 packs/day for 45 years  . Types: Cigarettes  . Quit date: 04/14/2011  Smokeless tobacco  . Not on file       Allergies  Allergen Reactions  . Sulfa Antibiotics Hives    Current Outpatient Prescriptions  Medication Sig Dispense Refill  . DULoxetine (CYMBALTA) 30 MG capsule Take 60 mg by mouth every morning.       . gabapentin (NEURONTIN) 300 MG capsule Take 300 mg by mouth 3 (three) times daily.      Marland Kitchen HYDROcodone-acetaminophen (LORTAB 7.5) 7.5-500 MG per tablet Take 1 tablet by mouth every 6 (six) hours as needed for pain (may take one or two tablets every 4-6 hrs prn).  40 tablet  0  . omeprazole (PRILOSEC) 40 MG capsule Take 40 mg by mouth daily.      Marland Kitchen zolpidem (AMBIEN) 10 MG tablet Take 10 mg by mouth at bedtime as needed. For sleep            Physical Exam: BP 120/70  Pulse 88  Resp 16  Ht 5\' 6"  (1.676 m)  Wt 154 lb (69.854 kg)  BMI 24.86 kg/m2  SpO2 96%  General appearance: alert and cooperative Neurologic: intact Heart: regular rate and rhythm, S1, S2 normal, no murmur, click, rub or gallop Lungs: clear to auscultation bilaterally Abdomen: soft, non-tender; bowel sounds normal; no masses,  no organomegaly Extremities: extremities normal, atraumatic, no cyanosis or edema and Homans sign is negative, no sign of DVT Wounds:healing well  Diagnostic Studies  & Laboratory data:         Recent Radiology Findings: Dg Chest 2 View  07/01/2011  *RADIOLOGY REPORT*  Clinical Data: Postop lung surgery.  CHEST - 2 VIEW  Comparison: 06/05/2011  Findings: Interval resolution of the previously seen right pneumothorax.  Removal of right central line.  Right basilar atelectasis has improved.  Minimal residual atelectasis.  There is hyperinflation of the lungs compatible with COPD.  Heart is normal size.  No effusions.  IMPRESSION: Resolution of right pneumothorax.  Improving right basilar atelectasis.  COPD.  Original Report Authenticated By: Cyndie Chime, M.D.      Recent Labs: Lab Results  Component Value Date   WBC 9.5 06/04/2011   HGB 12.2 06/04/2011   HCT 35.7* 06/04/2011   PLT 267 06/04/2011   GLUCOSE 106* 06/04/2011   ALT 15 06/04/2011   AST 18 06/04/2011   NA 138 06/04/2011   K 3.3* 06/04/2011   CL 101 06/04/2011   CREATININE 0.65 06/04/2011   BUN 7 06/04/2011   CO2 29 06/04/2011   INR 1.04 06/01/2011      Assessment / Plan:     Patient not  Smoking Good progress post op Will see back with chest xray in 3 months Path discussed with the patient and the need for continued follow       Delight Ovens MD 07/01/2011 1:38 PM

## 2011-07-01 NOTE — Patient Instructions (Signed)
Thoracotomy Care After Refer to this sheet in the next few weeks. These instructions provide you with information on caring for yourself after your procedure. Your caregiver may also give you more specific instructions. Your treatment has been planned according to current medical practices, but problems sometimes occur. Call your caregiver if you have any problems or questions after your procedure. HOME CARE INSTRUCTIONS  Remove the bandage (dressing) over your chest tube site as directed by your caregiver.   It is normal to be sore for a couple weeks following surgery. See your caregiver if this seems to be getting worse rather than better.   Only take over-the-counter or prescription medicines for pain, discomfort, or fever as directed by your caregiver. It is very important to take pain medicine when you need it so that you will cough and breathe deeply enough to clear mucus (phlegm) and expand your lungs. This helps prevent a lung infection (pneumonia).   If it hurts to cough, hold a pillow against your chest when you cough. This may help with the discomfort. In spite of the discomfort, cough frequently.   Taking deep breaths keeps lungs inflated and protects against pneumonia. Most patients will go home with a device called an incentive spirometer that encourages deep breathing.   You may resume a normal diet and activities as directed.   Use showers for bathing until you see your caregiver, or as instructed.   Change dressings if necessary or as directed.   Avoid lifting or driving until you are instructed otherwise.   Make an appointment to see your caregiver for stitch (suture) or staple removal when instructed.   Do not travel by airplane for 2 weeks after the chest tube is removed.  SEEK MEDICAL CARE IF:  You are bleeding from your wounds.   Your heartbeat seems irregular.   You have redness, swelling, or increasing pain in the wounds.   There is pus coming from your  wounds.   There is a bad smell coming from the wound or dressing.  SEEK IMMEDIATE MEDICAL CARE IF:  You have a fever.   You develop a rash.   You have difficulty breathing.   You develop any reaction or side effects to medicines given.   You develop lightheadedness or feel faint.   You develop shortness of breath or chest pain.  MAKE SURE YOU:  Understand these instructions.   Will watch your condition.   Will get help right away if you are not doing well or get worse.  Document Released: 09/18/2010 Document Revised: 03/25/2011 Document Reviewed: 09/18/2010 [employer] Patient Information 2012 [employer]. 

## 2011-07-04 ENCOUNTER — Encounter: Payer: Self-pay | Admitting: Cardiothoracic Surgery

## 2011-07-04 DIAGNOSIS — C761 Malignant neoplasm of thorax: Secondary | ICD-10-CM | POA: Insufficient documentation

## 2011-09-30 ENCOUNTER — Ambulatory Visit: Payer: Self-pay | Admitting: Cardiothoracic Surgery

## 2011-10-15 ENCOUNTER — Other Ambulatory Visit: Payer: Self-pay | Admitting: Cardiothoracic Surgery

## 2011-10-15 DIAGNOSIS — D381 Neoplasm of uncertain behavior of trachea, bronchus and lung: Secondary | ICD-10-CM

## 2011-10-28 ENCOUNTER — Ambulatory Visit: Payer: Self-pay | Admitting: Cardiothoracic Surgery

## 2011-11-04 ENCOUNTER — Ambulatory Visit: Payer: Self-pay | Admitting: Cardiothoracic Surgery

## 2011-11-18 ENCOUNTER — Other Ambulatory Visit: Payer: Self-pay | Admitting: Cardiothoracic Surgery

## 2011-11-18 ENCOUNTER — Ambulatory Visit: Payer: Self-pay | Admitting: Cardiothoracic Surgery

## 2011-11-18 DIAGNOSIS — D381 Neoplasm of uncertain behavior of trachea, bronchus and lung: Secondary | ICD-10-CM

## 2011-11-24 ENCOUNTER — Ambulatory Visit
Admission: RE | Admit: 2011-11-24 | Discharge: 2011-11-24 | Disposition: A | Discharge status: Home / Self Care | Payer: No Typology Code available for payment source | Source: Ambulatory Visit | Attending: Cardiothoracic Surgery | Admitting: Cardiothoracic Surgery

## 2011-11-24 DIAGNOSIS — D381 Neoplasm of uncertain behavior of trachea, bronchus and lung: Secondary | ICD-10-CM

## 2011-11-25 ENCOUNTER — Encounter: Payer: Self-pay | Admitting: Cardiothoracic Surgery

## 2011-11-25 ENCOUNTER — Ambulatory Visit (INDEPENDENT_AMBULATORY_CARE_PROVIDER_SITE_OTHER): Payer: Self-pay | Admitting: Cardiothoracic Surgery

## 2011-11-25 VITALS — BP 124/76 | HR 80 | Resp 18 | Ht 66.0 in | Wt 154.0 lb

## 2011-11-25 DIAGNOSIS — Z09 Encounter for follow-up examination after completed treatment for conditions other than malignant neoplasm: Secondary | ICD-10-CM

## 2011-11-25 DIAGNOSIS — D491 Neoplasm of unspecified behavior of respiratory system: Secondary | ICD-10-CM

## 2011-11-25 DIAGNOSIS — C761 Malignant neoplasm of thorax: Secondary | ICD-10-CM

## 2011-11-25 NOTE — Progress Notes (Signed)
[address]            [place] [address]          [phone]         Andrea Morgan [facility] Medical Record #[id number] Date of Birth: 01/20/1951  Andrea Peer, MD [name], MD  Chief Complaint:   PostOp Follow Up Visit PROCEDURE PERFORMED: Bronchoscopy, right video-assisted thoracoscopy,  left minithoracotomy and resection of 10-cm spindle cell tumor involving  the right chest. 06/02/2011   History of Present Illness:      Doing well post op, gaining strength, no fever or chills. Still NOT SMOKING. Some trouble sleeping Back working, " Home instead"    History  Smoking status  . Former Smoker -- 1.0 packs/day for 45 years  . Types: Cigarettes  . Quit date: 04/14/2011  Smokeless tobacco  . Not on file       Allergies  Allergen Reactions  . Sulfa Antibiotics Hives    Current Outpatient Prescriptions  Medication Sig Dispense Refill  . DULoxetine (CYMBALTA) 30 MG capsule Take 60 mg by mouth every morning.       . zolpidem (AMBIEN) 10 MG tablet Take 10 mg by mouth at bedtime as needed. For sleep            Physical Exam: BP 124/76  Pulse 80  Resp 18  Ht 5\' 6"  (1.676 m)  Wt 154 lb (69.854 kg)  BMI 24.86 kg/m2  SpO2 98%  General appearance: alert and cooperative Neurologic: intact Heart: regular rate and rhythm, S1, S2 normal, no murmur, click, rub or gallop Lungs: clear to auscultation bilaterally Abdomen: soft, non-tender; bowel sounds normal; no masses,  no organomegaly Extremities: extremities normal, atraumatic, no cyanosis or edema and Homans sign is negative, no sign of DVT Wounds:healing well No cervical  Or  axillary adenopathy  Diagnostic Studies & Laboratory data:         Recent Radiology Findings: Dg Chest 2 View  11/24/2011  *RADIOLOGY REPORT*  Clinical Data: History of lung cancer, post right-sided lung surgery  CHEST - 2 VIEW  Comparison: 07/01/2011; 06/05/2011;  06/04/2011; PET CT - 05/26/2011  Findings:  Grossly unchanged cardiac silhouette and mediastinal contours.  The lungs remain hyperinflated.  Grossly unchanged ill-defined heterogeneous opacity within the right mid/lower lung.  There is persistent slight upward tenting of the peripheral aspect of the right hemidiaphragm.  No pleural effusion or pneumothorax.  Grossly unchanged biapical pleural parenchymal thickening.  Unchanged bones.  IMPRESSION:  1.  Emphysematous change without acute cardiopulmonary disease. 2.   Stable heterogeneous opacities within the right mid/lower lung, possibly atelectasis or contusion. Continued attention on follow-up is recommended.  Original Report Authenticated By: Waynard Reeds, M.D.      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 Microscopic Comment The tumor is composed of relatively uniform ovoid to spindle cells demonstrating a haphazard morphologic pattern. Within the tumor, there is prominent vasculature that in some areas demonstrates a staghorn configuration. There is abundant hyalinization with focal calcification and patchy areas of necrosis present. Although the nuclei are relatively monomorphic, there are patchy areas of significant nuclear atypia. Mitotic activity is variable with up to 3 mitoses per high-power field. The immunophenotype is as follows: Vimentin Diffuse strong expression SMA Negative expression EMA Negative expression S-100 Negative expression Desmin Negative expression Chromogranin Negative  expression Cytokeratin AE1/3 Negative expression CD-99 Diffuse moderate to strong expression CD-34 Diffuse strong expression CD-31 Negative expression Calretinin Negative expression BCL-2 Diffuse strong expression Overall, the morphology and immunophenotype are diagnostic of solitary fibrous tumor. 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 The tumor is relatively large (greater than 10cm), is focally necrotic, exhibits cytologic atypia and more than an occasional mitotic figure is identified. Although there is no anaplastic cytologic features, significantly increased mitotic activity, or true sarcomatous differentiation, the morphologic findings are atypical for the "usual" solitary fibrous tumor. Some studies have shown that even with atypical or frankly malignant features, approximately 50% of those tumors actually pursued an aggressive clinical course. Regardless, histopathology is not discriminatory to predict clinical behavior and as such, all pleuropulmonary solitary fibrous tumors, atypical or otherwise, should be consider to be tumors of uncertain malignant potential. The case was reviewed with Dr. Colonel Bald who concurs. (CR:mw 07-02-11)  Assessment / Plan:   Patient not Smoking Good progress post op Will see back with CT  of Chest in 3months Path discussed with the patient and the need for continued follow   Delight Ovens MD 11/25/2011 1:38 PM

## 2012-02-02 ENCOUNTER — Other Ambulatory Visit: Payer: Self-pay | Admitting: Cardiothoracic Surgery

## 2012-02-02 DIAGNOSIS — C761 Malignant neoplasm of thorax: Secondary | ICD-10-CM

## 2012-03-09 ENCOUNTER — Other Ambulatory Visit: Payer: Self-pay | Admitting: Cardiothoracic Surgery

## 2012-03-09 ENCOUNTER — Other Ambulatory Visit: Payer: Self-pay

## 2012-03-09 ENCOUNTER — Ambulatory Visit (HOSPITAL_COMMUNITY)
Admission: RE | Admit: 2012-03-09 | Discharge: 2012-03-09 | Disposition: A | Discharge status: Home / Self Care | Payer: Self-pay | Source: Ambulatory Visit | Attending: Cardiothoracic Surgery | Admitting: Cardiothoracic Surgery

## 2012-03-09 ENCOUNTER — Encounter: Payer: Self-pay | Admitting: Cardiothoracic Surgery

## 2012-03-09 ENCOUNTER — Ambulatory Visit (INDEPENDENT_AMBULATORY_CARE_PROVIDER_SITE_OTHER): Payer: Self-pay | Admitting: Cardiothoracic Surgery

## 2012-03-09 VITALS — BP 127/80 | HR 80 | Resp 18 | Ht 66.0 in | Wt 168.0 lb

## 2012-03-09 DIAGNOSIS — R918 Other nonspecific abnormal finding of lung field: Secondary | ICD-10-CM

## 2012-03-09 DIAGNOSIS — J438 Other emphysema: Secondary | ICD-10-CM | POA: Insufficient documentation

## 2012-03-09 DIAGNOSIS — C761 Malignant neoplasm of thorax: Secondary | ICD-10-CM | POA: Insufficient documentation

## 2012-03-09 DIAGNOSIS — R222 Localized swelling, mass and lump, trunk: Secondary | ICD-10-CM

## 2012-03-09 LAB — BUN: BUN: 12 mg/dL (ref 6–23)

## 2012-03-09 LAB — CREATININE, SERUM
Creatinine, Ser: 0.95 mg/dL (ref 0.50–1.10)
GFR calc Af Amer: 72 mL/min — ABNORMAL LOW (ref >90)
GFR calc non Af Amer: 62 mL/min — ABNORMAL LOW (ref >90)

## 2012-03-09 MED ORDER — IOHEXOL 300 MG/ML  SOLN
80.0000 mL | Freq: Once | INTRAMUSCULAR | Status: AC | PRN
  Administered 2012-03-09: 80 mL via INTRAVENOUS

## 2012-03-09 NOTE — Patient Instructions (Signed)
Good you are not smoking Follow up scan in 6 months

## 2012-03-09 NOTE — Progress Notes (Signed)
[address]            [place] [address]          [phone]        Andrea Morgan [facility] Medical Record #[id number] Date of Birth: 1950/12/14  Andrea Peer, MD [name], MD  Chief Complaint:   PostOp Follow Up Visit PROCEDURE PERFORMED: Bronchoscopy, right video-assisted thoracoscopy,  left minithoracotomy and resection of 10-cm spindle cell tumor involving  the right chest. 06/02/2011   History of Present Illness:      Doing well post op, gaining strength, no fever or chills. Still NOT SMOKING.. .    History  Smoking status  . Former Smoker -- 1.0 packs/day for 45 years  . Types: Cigarettes  . Quit date: 04/14/2011  Smokeless tobacco  . Not on file       Allergies  Allergen Reactions  . Sulfa Antibiotics Hives    Current Outpatient Prescriptions  Medication Sig Dispense Refill  . DULoxetine (CYMBALTA) 30 MG capsule Take 60 mg by mouth every morning.       . zolpidem (AMBIEN) 10 MG tablet Take 10 mg by mouth at bedtime as needed. For sleep        No current facility-administered medications for this visit.   Facility-Administered Medications Ordered in Other Visits  Medication Dose Route Frequency Provider Last Rate Last Dose  . [COMPLETED] iohexol (OMNIPAQUE) 300 MG/ML solution 80 mL  80 mL Intravenous Once PRN Medication Radiologist, MD   80 mL at 03/09/12 1225       Physical Exam: BP 127/80  Pulse 80  Resp 18  Ht 5\' 6"  (1.676 m)  Wt 168 lb (76.204 kg)  BMI 27.12 kg/m2  SpO2 96%  General appearance: alert and cooperative Neurologic: intact Heart: regular rate and rhythm, S1, S2 normal, no murmur, click, rub or gallop Lungs: clear to auscultation bilaterally Abdomen: soft, non-tender; bowel sounds normal; no masses,  no organomegaly Extremities: extremities normal, atraumatic, no cyanosis or edema and Homans sign is negative, no sign of DVT Wounds:healing  well No cervical  Or  axillary adenopathy  Diagnostic Studies & Laboratory data:         Recent Radiology Findings: Ct Chest W Contrast  03/09/2012  *RADIOLOGY REPORT*  Clinical Data: 61 year old female with history of large right lower lung mass status post resection.  10 cm spindle cell tumor Pathology.  CT CHEST WITH CONTRAST  Technique:  Multidetector CT imaging of the chest was performed following the standard protocol during bolus administration of intravenous contrast.  Contrast: 80mL OMNIPAQUE IOHEXOL 300 MG/ML  SOLN  Comparison: Preoperative PET-CT 05/26/2011.  Preoperative chest CT 05/06/2011.  Findings: Interval resection of the large partially calcified right lung base mass.  Staple line and residual crescentic and mildly spiculated soft tissue in the right middle lobe (series 3 image 38, sagittal image 23).  Associated volume reduction in the right hemithorax.  The postoperative changes and irregular opacity extend to the outer pleural surface (series 3 image 42).  The postoperative mediastinal contour is within normal limits.  No mediastinal mass or lymphadenopathy.  The remainder of the right lung is stable with areas of moderate to severe  central lobular emphysema in the right upper lobe. Scattered central lobular emphysema and also in the left lung which is clear aside from minor atelectasis.  No pleural or pericardial effusion.  Low density 16 mm left adrenal nodule is stable to slightly increased in size since December 2012. This was without hypermetabolism in February.  Other visualized upper abdominal viscera are within normal limits.  Negative thoracic inlet.  No acute or suspicious osseous lesion.  IMPRESSION: 1.  Interval resection of the large right lung base tumor.  Mild residual crescentic and irregular opacity along the staple line in the right middle lobe.  The study will serve as a new postoperative baseline. 2.  No mediastinal mass or lymphadenopathy.  No new abnormality in the  chest. 3.  Emphysema. 4. Left adrenal nodule is not significantly changed and was felt benign on the basis of PET.   Original Report Authenticated By: Erskine Speed, M.D.       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 Microscopic Comment The tumor is composed of relatively uniform ovoid to spindle cells demonstrating a haphazard morphologic pattern. Within the tumor, there is prominent vasculature that in some areas demonstrates a staghorn configuration. There is abundant hyalinization with focal calcification and patchy areas of necrosis present. Although the nuclei are relatively monomorphic, there are patchy areas of significant nuclear atypia. Mitotic activity is variable with up to 3 mitoses per high-power field. 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 The tumor is relatively large (greater than 10cm), is focally necrotic, exhibits cytologic  atypia and more than an occasional mitotic figure is identified. Although there is no anaplastic cytologic features, significantly increased mitotic activity, or true sarcomatous differentiation, the morphologic findings are atypical for the "usual" solitary fibrous tumor. Some studies have shown that even with atypical or frankly malignant features, approximately 50% of those tumors actually pursued an aggressive clinical course. Regardless, histopathology is not discriminatory to predict clinical behavior and as such, all pleuropulmonary solitary fibrous tumors, atypical or otherwise, should be consider to be tumors of uncertain malignant potential. The case was reviewed with Dr. Colonel Bald who concurs. (CR:mw 07-02-11)  Assessment / Plan:   Patient not Smoking Good progress post op, without evidence of recurrent disease on CT of the chest. I'll plan to see her back in 6 months with a followup CT of the chest.  Delight Ovens MD 03/09/2012 1:31 PM

## 2012-08-25 ENCOUNTER — Other Ambulatory Visit: Payer: Self-pay | Admitting: *Deleted

## 2012-08-25 DIAGNOSIS — D219 Benign neoplasm of connective and other soft tissue, unspecified: Secondary | ICD-10-CM

## 2012-09-14 ENCOUNTER — Encounter: Payer: Self-pay | Admitting: Cardiothoracic Surgery

## 2012-09-14 ENCOUNTER — Ambulatory Visit
Admission: RE | Admit: 2012-09-14 | Discharge: 2012-09-14 | Disposition: A | Discharge status: Home / Self Care | Payer: BC Managed Care – PPO | Source: Ambulatory Visit | Attending: Cardiothoracic Surgery | Admitting: Cardiothoracic Surgery

## 2012-09-14 ENCOUNTER — Ambulatory Visit (INDEPENDENT_AMBULATORY_CARE_PROVIDER_SITE_OTHER): Payer: BC Managed Care – PPO | Admitting: Cardiothoracic Surgery

## 2012-09-14 VITALS — BP 153/88 | HR 82 | Resp 20 | Ht 66.0 in | Wt 170.0 lb

## 2012-09-14 DIAGNOSIS — D219 Benign neoplasm of connective and other soft tissue, unspecified: Secondary | ICD-10-CM

## 2012-09-14 DIAGNOSIS — C761 Malignant neoplasm of thorax: Secondary | ICD-10-CM

## 2012-09-14 MED ORDER — IOHEXOL 300 MG/ML  SOLN
75.0000 mL | Freq: Once | INTRAMUSCULAR | Status: AC | PRN
  Administered 2012-09-14: 75 mL via INTRAVENOUS

## 2012-09-14 NOTE — Progress Notes (Signed)
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 06/02/2011   History of Present Illness:      Doing well post op, gaining strength, no fever or chills. Still NOT SMOKING.. .    History  Smoking status  . Former Smoker -- 1.00 packs/day for 45 years  . Types: Cigarettes  . Quit date: 04/14/2011  Smokeless tobacco  . Not on file       Allergies  Allergen Reactions  . Sulfa Antibiotics Hives    Current Outpatient Prescriptions  Medication Sig Dispense Refill  . DULoxetine (CYMBALTA) 30 MG capsule Take 60 mg by mouth every morning.        No current facility-administered medications for this visit.       Physical Exam: BP 153/88  Pulse 82  Resp 20  Ht 5\' 6"  (1.676 m)  Wt 170 lb (77.111 kg)  BMI 27.45 kg/m2  SpO2 98%  General appearance: alert and cooperative Neurologic: intact Heart: regular rate and rhythm, S1, S2 normal, no murmur, click, rub or gallop Lungs: clear to auscultation bilaterally Abdomen: soft, non-tender; bowel sounds normal; no masses,  no organomegaly Extremities: extremities normal, atraumatic, no cyanosis or edema and Homans sign is negative, no sign of DVT Wounds:healing well No cervical  Or  axillary adenopathy  Diagnostic Studies & Laboratory data:         Recent Radiology Findings: Ct Chest W Contrast  09/14/2012   *RADIOLOGY REPORT*  Clinical Data: Follow up right lung base tumor.  CT CHEST WITH CONTRAST  Technique:  Multidetector CT imaging of the chest was performed following the standard protocol during bolus administration of intravenous  contrast.  Contrast: 75mL OMNIPAQUE IOHEXOL 300 MG/ML  SOLN  Comparison: 03/09/2012.  Findings: There is no pleural effusion identified.  Scar is noted within the right lung base and is stable compared with the previous examination.  Moderate changes of centrilobular emphysema noted. No airspace consolidation.  Heart size is normal.  No pericardial effusion.  Calcification noted within the LAD coronary artery noted.  There is no mediastinal or hilar adenopathy identified.  No focal liver abnormalities identified.  Nodule in the left adrenal gland is unchanged measuring 1.9 cm.  Review of the visualized osseous structures is significant for mild spondylosis.  No aggressive lytic or sclerotic bone lesions identified.  IMPRESSION:  1.  No acute findings. 2.  Stable scar in the right lung base. 3.  Coronary artery calcifications.   Original Report Authenticated By: Signa Kell, M.D.      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 Microscopic Comment The tumor is composed of relatively uniform ovoid to spindle cells demonstrating a haphazard morphologic pattern. Within the tumor, there is prominent vasculature that in some areas demonstrates a staghorn configuration. There is abundant hyalinization with focal calcification and patchy areas of necrosis present. Although the nuclei are relatively monomorphic, there are patchy areas of significant nuclear atypia. Mitotic activity is variable with up to 3 mitoses per high-power field. The immunophenotype is as follows: Vimentin  Diffuse strong expression SMA Negative expression EMA Negative expression S-100 Negative expression Desmin Negative expression Chromogranin Negative expression Cytokeratin AE1/3 Negative expression CD-99 Diffuse moderate to strong expression CD-34 Diffuse strong expression CD-31 Negative expression Calretinin Negative expression BCL-2 Diffuse strong expression Overall, the morphology and immunophenotype are diagnostic of solitary fibrous tumor. 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 The tumor is relatively large (greater than 10cm), is focally necrotic, exhibits cytologic atypia and more than an occasional mitotic figure is identified. Although there is no anaplastic cytologic features, significantly increased mitotic activity, or true sarcomatous differentiation, the morphologic findings are atypical for the "usual" solitary fibrous tumor. Some studies have shown that even with atypical or frankly malignant features, approximately 50% of those tumors actually pursued an aggressive clinical course. Regardless, histopathology is not discriminatory to predict clinical behavior and as such, all pleuropulmonary solitary fibrous tumors, atypical or otherwise, should be consider to be tumors of uncertain malignant potential. The case was reviewed with Dr. Colonel Bald who concurs. (CR:mw 07-02-11)  Assessment / Plan:   Patient not Smoking Good progress post op, without evidence of recurrent disease on CT of the chest. I'll plan to see her back in 7 months with a followup CT of the chest.  Delight Ovens MD 09/14/2012 4:31 PM

## 2012-11-05 ENCOUNTER — Emergency Department (HOSPITAL_COMMUNITY): Payer: BC Managed Care – PPO

## 2012-11-05 ENCOUNTER — Encounter (HOSPITAL_COMMUNITY): Payer: Self-pay | Admitting: *Deleted

## 2012-11-05 ENCOUNTER — Emergency Department (HOSPITAL_COMMUNITY)
Admission: EM | Admit: 2012-11-05 | Discharge: 2012-11-05 | Disposition: A | Discharge status: Home / Self Care | Payer: BC Managed Care – PPO | Attending: Emergency Medicine | Admitting: Emergency Medicine

## 2012-11-05 DIAGNOSIS — Z79899 Other long term (current) drug therapy: Secondary | ICD-10-CM | POA: Insufficient documentation

## 2012-11-05 DIAGNOSIS — Z8709 Personal history of other diseases of the respiratory system: Secondary | ICD-10-CM | POA: Insufficient documentation

## 2012-11-05 DIAGNOSIS — Z87442 Personal history of urinary calculi: Secondary | ICD-10-CM | POA: Insufficient documentation

## 2012-11-05 DIAGNOSIS — J90 Pleural effusion, not elsewhere classified: Secondary | ICD-10-CM

## 2012-11-05 DIAGNOSIS — Z87891 Personal history of nicotine dependence: Secondary | ICD-10-CM | POA: Insufficient documentation

## 2012-11-05 DIAGNOSIS — R209 Unspecified disturbances of skin sensation: Secondary | ICD-10-CM | POA: Insufficient documentation

## 2012-11-05 DIAGNOSIS — F411 Generalized anxiety disorder: Secondary | ICD-10-CM | POA: Insufficient documentation

## 2012-11-05 DIAGNOSIS — Z85118 Personal history of other malignant neoplasm of bronchus and lung: Secondary | ICD-10-CM | POA: Insufficient documentation

## 2012-11-05 DIAGNOSIS — Z8669 Personal history of other diseases of the nervous system and sense organs: Secondary | ICD-10-CM | POA: Insufficient documentation

## 2012-11-05 DIAGNOSIS — R0789 Other chest pain: Secondary | ICD-10-CM | POA: Insufficient documentation

## 2012-11-05 DIAGNOSIS — Z981 Arthrodesis status: Secondary | ICD-10-CM | POA: Insufficient documentation

## 2012-11-05 DIAGNOSIS — Z8719 Personal history of other diseases of the digestive system: Secondary | ICD-10-CM | POA: Insufficient documentation

## 2012-11-05 DIAGNOSIS — F329 Major depressive disorder, single episode, unspecified: Secondary | ICD-10-CM | POA: Insufficient documentation

## 2012-11-05 DIAGNOSIS — R059 Cough, unspecified: Secondary | ICD-10-CM | POA: Insufficient documentation

## 2012-11-05 DIAGNOSIS — R05 Cough: Secondary | ICD-10-CM | POA: Insufficient documentation

## 2012-11-05 DIAGNOSIS — F3289 Other specified depressive episodes: Secondary | ICD-10-CM | POA: Insufficient documentation

## 2012-11-05 LAB — CBC WITH DIFFERENTIAL/PLATELET
Basophils Relative: 0 % (ref 0–1)
Eosinophils Absolute: 0.1 10*3/uL (ref 0.0–0.7)
Hemoglobin: 11.5 g/dL — ABNORMAL LOW (ref 12.0–15.0)
Lymphs Abs: 1.2 10*3/uL (ref 0.7–4.0)
MCH: 28 pg (ref 26.0–34.0)
Neutro Abs: 7.5 10*3/uL (ref 1.7–7.7)
Neutrophils Relative %: 80 % — ABNORMAL HIGH (ref 43–77)
Platelets: 303 10*3/uL (ref 150–400)
RBC: 4.11 MIL/uL (ref 3.87–5.11)

## 2012-11-05 LAB — POCT I-STAT, CHEM 8
Chloride: 107 mEq/L (ref 96–112)
HCT: 34 % — ABNORMAL LOW (ref 36.0–46.0)
Hemoglobin: 11.6 g/dL — ABNORMAL LOW (ref 12.0–15.0)
Potassium: 3.8 mEq/L (ref 3.5–5.1)
Sodium: 140 mEq/L (ref 135–145)

## 2012-11-05 MED ORDER — HYDROCODONE-ACETAMINOPHEN 5-325 MG PO TABS
2.0000 | ORAL_TABLET | Freq: Once | ORAL | Status: AC
  Administered 2012-11-05: 2 via ORAL
  Filled 2012-11-05: qty 2

## 2012-11-05 MED ORDER — HYDROCODONE-ACETAMINOPHEN 5-325 MG PO TABS: 1.0000 | ORAL_TABLET | Freq: Four times a day (QID) | ORAL | Status: DC | PRN

## 2012-11-05 MED ORDER — IOHEXOL 350 MG/ML SOLN: 100.0000 mL | Freq: Once | INTRAVENOUS | Status: DC | PRN

## 2012-11-05 MED ORDER — IOHEXOL 350 MG/ML SOLN
100.0000 mL | Freq: Once | INTRAVENOUS | Status: AC | PRN
  Administered 2012-11-05: 100 mL via INTRAVENOUS

## 2012-11-05 NOTE — ED Notes (Signed)
Patient transported to CT 

## 2012-11-05 NOTE — ED Notes (Signed)
Pt states Tuesday started having pain across chest, under both breasts and around to back area, states got better Thursday but then started up again Friday, pt states feels short of breath, denies n/v/d, denies tingling, states Friday had tingling in R fingers, pt also states having R upper arm pain, pt has a R lung ca hx, surgery Feb 2013, scans have been negative since. Pt unable to rate pain, states "It just hurts".

## 2012-11-05 NOTE — ED Notes (Signed)
Patient transported to X-ray 

## 2012-11-05 NOTE — ED Provider Notes (Signed)
History    CSN: [id number] Arrival date & time 11/05/12  1234  First MD Initiated Contact with Patient 11/05/12 1239     Chief Complaint  Patient presents with  . Pleurisy   (Consider location/radiation/quality/duration/timing/severity/associated sxs/prior Treatment) HPI Comments: Patient is a 62 year old female with a history of spiral cell lung cancer and pleurisy who presents for chest pain x5 days, worsening x3 days. Patient states the pain is present in her anterior and posterior lung fields and is sharp in nature. Symptoms are worse with movement, lying supine, and coughing. Patient denies any alleviating factors; states she took ibuprofen with no relief. Patient endorsing associated tingling in her L middle finger on Friday which spontaneously resolved and has not recurred. She denies fever, lightheadedness, dizzinessness, syncope, substernal chest pain, N/V, abdominal pain, numbness, and extremity weakness. Patient's last CT scan of her chest was in May 2014 which showed no acute changes. She is followed by Dr. Tyrone Sage of cardiothoracic surgery since her Surgery for spiral cell lung CA in 05/2011.   The history is provided by the patient. No language interpreter was used.   Past Medical History  Diagnosis Date  . Insomnia   . Chronic headaches   . Allergic rhinitis   . Depression   . Depression   . PONV (postoperative nausea and vomiting)   . Shortness of breath     with exercsie  . Cancer   . Anxiety   . GERD (gastroesophageal reflux disease)   . H/O hiatal hernia   . History of kidney stones    Past Surgical History  Procedure Laterality Date  . Appendectomy  1978  . Tonsillectomy  1971  . Cervical fusion  1997  . Video bronchoscopy  05/11/2011    Procedure: VIDEO BRONCHOSCOPY WITH FLUORO;  Surgeon: [name], MD;  Location: [facility];  Service: Cardiopulmonary;  Laterality: Bilateral;  . Cervical fusion    . Esophagogastroduodenoscopy  05/21/2011     Procedure: ESOPHAGOGASTRODUODENOSCOPY (EGD);  Surgeon: Theda Belfast, MD;  Location: [facility];  Service: Endoscopy;  Laterality: N/A;  . Video bronchoscopy  06/02/2011    Procedure: VIDEO BRONCHOSCOPY;  Surgeon: Delight Ovens, MD;  Location[facility];  Service: Thoracic;  Laterality: N/A;  . Mass excision  06/02/2011    Procedure: EXCISION MASS;  Surgeon: Delight Ovens, MD;  Location: [facility] OR;  Service: Thoracic;;   Family History  Problem Relation Age of Onset  . Asthma Mother   . Heart disease Mother   . Asthma Maternal Grandmother   . Lung cancer Father   . Ovarian cancer Maternal Aunt   . Breast cancer Maternal Aunt     x2  . Prostate cancer Maternal Uncle   . Kidney cancer Sister   . Anesthesia problems Neg Hx    History  Substance Use Topics  . Smoking status: Former Smoker -- 1.00 packs/day for 45 years    Types: Cigarettes    Quit date: 04/14/2011  . Smokeless tobacco: Not on file  . Alcohol Use: No   OB History   Grav Para Term Preterm Abortions TAB SAB Ect Mult Living                 Review of Systems  Constitutional: Negative for fever.  Eyes: Negative for visual disturbance.  Respiratory: Positive for cough (dry and "deep") and chest tightness. Negative for shortness of breath and wheezing.   Cardiovascular: Negative for palpitations.  Gastrointestinal: Negative for nausea and vomiting.  Neurological: Negative for weakness and numbness.  All other systems reviewed and are negative.   Allergies  Sulfa antibiotics  Home Medications   Current Outpatient Rx  Name  Route  Sig  Dispense  Refill  . DULoxetine (CYMBALTA) 30 MG capsule   Oral   Take 60 mg by mouth every morning.          . Multiple Vitamin (MULTIVITAMIN WITH MINERALS) TABS   Oral   Take 1 tablet by mouth daily.         Marland Kitchen HYDROcodone-acetaminophen (NORCO/VICODIN) 5-325 MG per tablet   Oral   Take 1-2 tablets by mouth every 6 (six) hours as needed for pain.   25 tablet   0     BP 100/45  Pulse 78  Temp(Src) 98.6 F (37 C) (Oral)  Resp 17  SpO2 93%  Physical Exam  Nursing note and vitals reviewed. Constitutional: She is oriented to person, place, and time. She appears well-developed and well-nourished. No distress.  HENT:  Head: Normocephalic and atraumatic.  Mouth/Throat: Oropharynx is clear and moist. No oropharyngeal exudate.  Eyes: EOM are normal. Pupils are equal, round, and reactive to light.  Neck: Normal range of motion. No JVD present.  Cardiovascular: Normal rate, regular rhythm and normal heart sounds.   Pulmonary/Chest: No respiratory distress. She has no wheezes. She has no rales. She exhibits tenderness (superior and inferior TTP of anterior and posterior lung fields).  Patient taking more frequent, and shallow, breaths; however, in no acute respiratory distress. Symmetric expansion of chest wall.  Abdominal: Soft. She exhibits no distension. There is no tenderness.  Musculoskeletal: Normal range of motion. She exhibits no edema.  Neurological: She is alert and oriented to person, place, and time.  Skin: Skin is warm and dry. No rash noted. She is not diaphoretic. No erythema. No pallor.  Psychiatric: She has a normal mood and affect. Her behavior is normal.   ED Course  Procedures (including critical care time) Labs Reviewed  CBC WITH DIFFERENTIAL - Abnormal; Notable for the following:    Hemoglobin 11.5 (*)    HCT 34.1 (*)    Neutrophils Relative % 80 (*)    All other components within normal limits  D-DIMER, QUANTITATIVE - Abnormal; Notable for the following:    D-Dimer, Quant 1.06 (*)    All other components within normal limits  POCT I-STAT, CHEM 8 - Abnormal; Notable for the following:    Glucose, Bld 107 (*)    Hemoglobin 11.6 (*)    HCT 34.0 (*)    All other components within normal limits    Date: 11/05/2012  Rate: 89  Rhythm: normal sinus rhythm  QRS Axis: normal  Intervals: normal  ST/T Wave abnormalities: normal   Conduction Disutrbances:none  Narrative Interpretation: NSR; no STEMI or ischemic changes  Old EKG Reviewed: unchanged from 06/01/2011 I have personally reviewed and interpreted this EKG  Dg Chest 2 View  11/05/2012   *RADIOLOGY REPORT*  Clinical Data: Shortness of breath.  History of lung cancer.  CHEST - 2 VIEW  Comparison: PA and lateral chest 11/24/2011 and CT chest 09/14/2012.  Findings: The lungs are emphysematous.  Chronic scarring in the right lung base is unchanged.  Lung volumes are low with some basilar atelectasis.  Trace bilateral pleural effusions noted. Heart size is normal.  IMPRESSION:  1.  Trace bilateral pleural effusions. 2.  Emphysema. 3.  Unchanged scar right lung base.   Original Report Authenticated By: Holley Dexter, M.D.  Ct Angio Chest Pe W/cm &/or Wo Cm  11/05/2012   *RADIOLOGY REPORT*  Clinical Data: Chest pain, shortness of breath, elevated D-dimer. History of lung cancer.  CT ANGIOGRAPHY CHEST  Technique:  Multidetector CT imaging of the chest using the standard protocol during bolus administration of intravenous contrast. Multiplanar reconstructed images including MIPs were obtained and reviewed to evaluate the vascular anatomy.  Contrast:  100 ml Omnipaque-300 IV  Comparison: Chest radiograph dated 11/12/2012.  CT chest dated 09/14/2012.  Findings: No evidence of pulmonary embolism.  Prior wedge resection at the anterior right lung base (series 11/image 66).  Small left and trace right pleural effusions. Associated lower lobe atelectasis.  No suspicious pulmonary nodules.  Moderate centrilobular and paraseptal emphysematous changes. No pneumothorax.  Visualized thyroid is unremarkable.  The heart is top normal in size.  Trace pericardial effusion.  Small mediastinal lymph nodes measuring up to 6 mm short axis (series 4/image 35).  No suspicious hilar or axillary lymphadenopathy.  The visualized upper abdomen is unremarkable.  Mild degenerative changes of the visualized  thoracolumbar spine. Prior C6-7 fusion.  IMPRESSION: No evidence of pulmonary embolism.  Prior right lower lobe wedge resection.  No evidence of recurrent or metastatic disease in the chest.  Small left and trace right pleural effusions.  Associated lower lobe atelectasis.   Original Report Authenticated By: Charline Bills, M.D.   1. Pleural effusion, bilateral    MDM  Patient with hx of pleurisy presents for pleuritic chest pain. Xray significant for trace b/l pleural effusions. No pneumonia, PTX or other acute cardiopulmonary abnormalities. EKG unchanged from prior. D dimer elevated and CT angio obtained. CT angio without evidence of pulmonary embolism. Doubt ACS given nature of symptoms, hx of pleurisy, and ED work up. Patient endorses significant improvement with Norco. Patient well and hemodynamically stable without tachypnea, dyspnea, or hypoxia. Stable for d/c with PCP follow up for further evaluation of symptoms. Rx for norco given for pain control. Patient seen also by Dr. Jodi Mourning who is in agreement with this work up, assessement, management plan, and patient's stability for d/c.   Antony Madura, PA-C 11/08/12 1609

## 2012-11-09 NOTE — ED Provider Notes (Signed)
Medical screening examination/treatment/procedure(s) were conducted as a shared visit with non-physician practitioner(s) or resident  and myself.  I personally evaluated the patient during the encounter and agree with the findings and plan unless otherwise indicated.  Diffuse chest wall pain.  With CA hx d dimer for pleuritic pain.  CT no blood clot.  Fup discussed.  Pt improved in ED.  DC  Enid Skeens, MD 11/09/12 [phone]

## 2013-03-08 ENCOUNTER — Other Ambulatory Visit: Payer: Self-pay | Admitting: *Deleted

## 2013-03-08 DIAGNOSIS — R222 Localized swelling, mass and lump, trunk: Secondary | ICD-10-CM

## 2013-03-08 DIAGNOSIS — C761 Malignant neoplasm of thorax: Secondary | ICD-10-CM

## 2013-03-09 ENCOUNTER — Other Ambulatory Visit: Payer: Self-pay | Admitting: *Deleted

## 2013-03-09 DIAGNOSIS — R222 Localized swelling, mass and lump, trunk: Secondary | ICD-10-CM

## 2013-04-23 LAB — CREATININE, SERUM: Creat: 0.71 mg/dL (ref 0.50–1.10)

## 2013-04-23 LAB — BUN: BUN: 16 mg/dL (ref 6–23)

## 2013-04-26 ENCOUNTER — Encounter: Payer: Self-pay | Admitting: Cardiothoracic Surgery

## 2013-04-26 ENCOUNTER — Ambulatory Visit
Admission: RE | Admit: 2013-04-26 | Discharge: 2013-04-26 | Disposition: A | Discharge status: Home / Self Care | Payer: Medicare Other | Source: Ambulatory Visit | Attending: Cardiothoracic Surgery | Admitting: Cardiothoracic Surgery

## 2013-04-26 ENCOUNTER — Ambulatory Visit (INDEPENDENT_AMBULATORY_CARE_PROVIDER_SITE_OTHER): Payer: Medicare Other | Admitting: Cardiothoracic Surgery

## 2013-04-26 VITALS — BP 134/83 | HR 97 | Resp 16 | Ht 66.0 in | Wt 165.0 lb

## 2013-04-26 DIAGNOSIS — D3500 Benign neoplasm of unspecified adrenal gland: Secondary | ICD-10-CM

## 2013-04-26 DIAGNOSIS — R222 Localized swelling, mass and lump, trunk: Secondary | ICD-10-CM

## 2013-04-26 DIAGNOSIS — J449 Chronic obstructive pulmonary disease, unspecified: Secondary | ICD-10-CM

## 2013-04-26 DIAGNOSIS — J984 Other disorders of lung: Secondary | ICD-10-CM | POA: Diagnosis not present

## 2013-04-26 DIAGNOSIS — Z09 Encounter for follow-up examination after completed treatment for conditions other than malignant neoplasm: Secondary | ICD-10-CM | POA: Diagnosis not present

## 2013-04-26 DIAGNOSIS — C761 Malignant neoplasm of thorax: Secondary | ICD-10-CM

## 2013-04-26 DIAGNOSIS — K449 Diaphragmatic hernia without obstruction or gangrene: Secondary | ICD-10-CM | POA: Diagnosis not present

## 2013-04-26 DIAGNOSIS — D3502 Benign neoplasm of left adrenal gland: Secondary | ICD-10-CM

## 2013-04-26 MED ORDER — IOHEXOL 300 MG/ML  SOLN
75.0000 mL | Freq: Once | INTRAMUSCULAR | Status: AC | PRN
  Administered 2013-04-26: 75 mL via INTRAVENOUS

## 2013-04-26 NOTE — Progress Notes (Signed)
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 06/02/2011   History of Present Illness:      Doing well post op, gaining strength, no fever or chills. Patient remains anxious, continues to see psychiatry. Unfortunately she is resumed smoking though not as heavily as she had in the past. .    History  Smoking status  . Former Smoker -- 1.00 packs/day for 45 years  . Types: Cigarettes  . Quit date: 04/14/2011  Smokeless tobacco  . Not on file       Allergies  Allergen Reactions  . Sulfa Antibiotics Hives    Current Outpatient Prescriptions  Medication Sig Dispense Refill  . DULoxetine (CYMBALTA) 30 MG capsule Take 60 mg by mouth every morning.       . zolpidem (AMBIEN) 10 MG tablet Take 10 mg by mouth at bedtime as needed for sleep.       No current facility-administered medications for this visit.       Physical Exam: BP 134/83  Pulse 97  Resp 16  Ht 5\' 6"  (1.676 m)  Wt 165 lb (74.844 kg)  BMI 26.64 kg/m2  SpO2 98%  General appearance: alert and cooperative Neurologic: intact Heart: regular rate and rhythm, S1, S2 normal, no murmur, click, rub or gallop Lungs: clear to auscultation bilaterally Abdomen: soft, non-tender; bowel sounds normal; no masses,  no organomegaly Extremities: extremities normal, atraumatic, no cyanosis or edema and Homans sign is negative, no sign of DVT Wounds:healing well No cervical  Or  axillary adenopathy  Diagnostic Studies & Laboratory data:         Recent Radiology Findings: Ct Chest W Contrast  04/26/2013   CLINICAL DATA:  Prior  resection of a right lower lobe mass, pathology revealing pleuroparenchymal solitary fibrous tumor, in February, 2013. Surveillance examination, as these tumors have uncertain malignant potential and there were atypical features in the resected tumor.  EXAM: CT CHEST WITH CONTRAST  TECHNIQUE: Multidetector CT imaging of the chest was performed during intravenous contrast administration.  CONTRAST:  18mL OMNIPAQUE IOHEXOL 300 MG/ML IV.  COMPARISON:  CTA chest 11/05/2012. CT chest 09/14/2012, 03/09/2012. PET-CT 05/26/2011.  FINDINGS: Curvilinear pleuroparenchymal opacity in the right middle lobe, unchanged dating back to the baseline postoperative examination in November, 2013. No evidence of recurrent tumor in either lung. Bullous emphysematous changes in the upper lobes, right greater than left. No localized airspace consolidation. No pleural effusions. Marked hyperinflation. Central airways patent with mild bronchial wall thickening.  No significant mediastinal, hilar, or axillary lymphadenopathy. Visualized thyroid gland upper normal in size without nodularity, unchanged.  Heart size normal. Moderate LAD coronary atherosclerosis. No pericardial effusion. Mild to moderate atherosclerosis involving the thoracic and upper abdominal aorta in the proximal great vessels without aneurysm or stenosis.  Small hiatal hernia, unchanged. Approximate 2.6 x 2.0 cm low-attenuation nodule involving the left adrenal gland, unchanged, not hypermetabolic on the prior PET. Remaining visualized upper abdomen unremarkable. Bone window images demonstrate mild osseous demineralization and mild  mid thoracic spondylosis.  IMPRESSION: 1. No evidence of recurrent tumor in the chest. 2. Stable curvilinear pleuroparenchymal scarring involving the right middle lobe. 3. COPD/emphysema.  No acute cardiopulmonary disease. 4. Moderate LAD coronary atherosclerosis. 5. Stable left adrenal adenoma. 6. Stable small hiatal hernia.   Electronically  Signed   By: Evangeline Dakin M.D.   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 Microscopic Comment The tumor is composed of relatively uniform ovoid to spindle cells demonstrating a haphazard morphologic pattern. Within the tumor, there is prominent vasculature that in some areas demonstrates a staghorn configuration. There is abundant hyalinization with focal calcification and patchy areas of necrosis present. Although the nuclei are relatively monomorphic, there are patchy areas of significant nuclear atypia. Mitotic activity is variable with up to 3 mitoses per high-power field. 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 The tumor is relatively large (greater than 10cm), is focally necrotic, exhibits cytologic atypia and more than an occasional mitotic figure is identified. Although there is no anaplastic cytologic  features, significantly increased mitotic activity, or true sarcomatous differentiation, the morphologic findings are atypical for the "usual" solitary fibrous tumor. Some studies have shown that even with atypical or frankly malignant features, approximately 50% of those tumors actually pursued an aggressive clinical course. Regardless, histopathology is not discriminatory to predict clinical behavior and as such, all pleuropulmonary solitary fibrous tumors, atypical or otherwise, should be consider to be tumors of uncertain malignant potential. The case was reviewed with Dr. Lyndon Code who concurs. (CR:mw 07-02-11)  Assessment / Plan:   Patient continues to  Smoking, I didn't discuss with her techniques to stop Of also discussed with her the findings on CT scan of COPD/emphysema Good progress post op, without evidence of recurrent disease on CT of the chest. I'll plan to see her back in 1 year with a followup CT of the chest.  Grace Isaac MD 04/26/2013 4:16 PM

## 2013-06-07 DIAGNOSIS — F259 Schizoaffective disorder, unspecified: Secondary | ICD-10-CM | POA: Diagnosis not present

## 2013-07-02 DIAGNOSIS — M26609 Unspecified temporomandibular joint disorder, unspecified side: Secondary | ICD-10-CM | POA: Diagnosis not present

## 2013-07-02 DIAGNOSIS — J019 Acute sinusitis, unspecified: Secondary | ICD-10-CM | POA: Diagnosis not present

## 2013-07-17 DIAGNOSIS — F339 Major depressive disorder, recurrent, unspecified: Secondary | ICD-10-CM | POA: Diagnosis not present

## 2013-07-31 ENCOUNTER — Encounter: Payer: Self-pay | Admitting: Internal Medicine

## 2013-07-31 ENCOUNTER — Other Ambulatory Visit: Payer: Self-pay | Admitting: Internal Medicine

## 2013-07-31 DIAGNOSIS — Z800 Family history of malignant neoplasm of digestive organs: Secondary | ICD-10-CM | POA: Diagnosis not present

## 2013-07-31 DIAGNOSIS — K219 Gastro-esophageal reflux disease without esophagitis: Secondary | ICD-10-CM | POA: Diagnosis not present

## 2013-07-31 DIAGNOSIS — M5126 Other intervertebral disc displacement, lumbar region: Secondary | ICD-10-CM | POA: Diagnosis not present

## 2013-07-31 DIAGNOSIS — Z1231 Encounter for screening mammogram for malignant neoplasm of breast: Secondary | ICD-10-CM

## 2013-07-31 DIAGNOSIS — G4700 Insomnia, unspecified: Secondary | ICD-10-CM | POA: Diagnosis not present

## 2013-07-31 DIAGNOSIS — Z6827 Body mass index (BMI) 27.0-27.9, adult: Secondary | ICD-10-CM | POA: Diagnosis not present

## 2013-07-31 DIAGNOSIS — C3490 Malignant neoplasm of unspecified part of unspecified bronchus or lung: Secondary | ICD-10-CM | POA: Diagnosis not present

## 2013-07-31 DIAGNOSIS — Z1331 Encounter for screening for depression: Secondary | ICD-10-CM | POA: Diagnosis not present

## 2013-07-31 DIAGNOSIS — F329 Major depressive disorder, single episode, unspecified: Secondary | ICD-10-CM | POA: Diagnosis not present

## 2013-07-31 DIAGNOSIS — F3289 Other specified depressive episodes: Secondary | ICD-10-CM | POA: Diagnosis not present

## 2013-08-09 ENCOUNTER — Ambulatory Visit: Payer: Medicare Other

## 2013-09-18 ENCOUNTER — Ambulatory Visit (AMBULATORY_SURGERY_CENTER): Payer: Self-pay

## 2013-09-18 VITALS — Ht 66.5 in | Wt 163.0 lb

## 2013-09-18 DIAGNOSIS — Z800 Family history of malignant neoplasm of digestive organs: Secondary | ICD-10-CM

## 2013-09-18 MED ORDER — MOVIPREP 100 G PO SOLR: 1.0000 | Freq: Once | ORAL | Status: DC

## 2013-09-18 NOTE — Progress Notes (Signed)
No allergies to eggs or soy No past problems with anesthesia except PONV(general) No diet/weight loss meds No home oxygen No email

## 2013-09-19 ENCOUNTER — Ambulatory Visit
Admission: RE | Admit: 2013-09-19 | Discharge: 2013-09-19 | Disposition: A | Discharge status: Home / Self Care | Payer: Medicare Other | Source: Ambulatory Visit | Attending: Internal Medicine | Admitting: Internal Medicine

## 2013-09-19 DIAGNOSIS — Z1231 Encounter for screening mammogram for malignant neoplasm of breast: Secondary | ICD-10-CM

## 2013-09-20 ENCOUNTER — Other Ambulatory Visit: Payer: Self-pay | Admitting: Internal Medicine

## 2013-09-20 DIAGNOSIS — N644 Mastodynia: Secondary | ICD-10-CM

## 2013-09-24 DIAGNOSIS — E785 Hyperlipidemia, unspecified: Secondary | ICD-10-CM | POA: Diagnosis not present

## 2013-09-24 DIAGNOSIS — E039 Hypothyroidism, unspecified: Secondary | ICD-10-CM | POA: Diagnosis not present

## 2013-09-27 ENCOUNTER — Other Ambulatory Visit: Payer: Self-pay

## 2013-09-27 ENCOUNTER — Encounter: Payer: Self-pay | Admitting: Internal Medicine

## 2013-09-27 ENCOUNTER — Ambulatory Visit (AMBULATORY_SURGERY_CENTER): Payer: Medicare Other | Admitting: Internal Medicine

## 2013-09-27 VITALS — BP 150/81 | HR 74 | Temp 96.8°F | Resp 20 | Ht 66.0 in | Wt 163.0 lb

## 2013-09-27 DIAGNOSIS — Z1211 Encounter for screening for malignant neoplasm of colon: Secondary | ICD-10-CM

## 2013-09-27 DIAGNOSIS — K635 Polyp of colon: Secondary | ICD-10-CM

## 2013-09-27 DIAGNOSIS — J449 Chronic obstructive pulmonary disease, unspecified: Secondary | ICD-10-CM | POA: Diagnosis not present

## 2013-09-27 DIAGNOSIS — D126 Benign neoplasm of colon, unspecified: Secondary | ICD-10-CM | POA: Diagnosis not present

## 2013-09-27 DIAGNOSIS — Z800 Family history of malignant neoplasm of digestive organs: Secondary | ICD-10-CM

## 2013-09-27 MED ORDER — SODIUM CHLORIDE 0.9 % IV SOLN: 500.0000 mL | INTRAVENOUS | Status: DC

## 2013-09-27 NOTE — Progress Notes (Signed)
Pt stable to RR 

## 2013-09-27 NOTE — Progress Notes (Signed)
Patient aware of the appt details for [facility]

## 2013-09-27 NOTE — Patient Instructions (Signed)
Discharge instructions given with verbal understanding. Handouts on polyps,diverticulosis and hemorrhoids. Hold aspirin and aspirin products for 2 weeks. Surgical consult via office. Resume previous medications. YOU HAD AN ENDOSCOPIC PROCEDURE TODAY AT [facility]: Refer to the procedure report that was given to you for any specific questions about what was found during the examination.  If the procedure report does not answer your questions, please call your gastroenterologist to clarify.  If you requested that your care partner not be given the details of your procedure findings, then the procedure report has been included in a sealed envelope for you to review at your convenience later.  YOU SHOULD EXPECT: Some feelings of bloating in the abdomen. Passage of more gas than usual.  Walking can help get rid of the air that was put into your GI tract during the procedure and reduce the bloating. If you had a lower endoscopy (such as a colonoscopy or flexible sigmoidoscopy) you may notice spotting of blood in your stool or on the toilet paper. If you underwent a bowel prep for your procedure, then you may not have a normal bowel movement for a few days.  DIET: Your first meal following the procedure should be a light meal and then it is ok to progress to your normal diet.  A half-sandwich or bowl of soup is an example of a good first meal.  Heavy or fried foods are harder to digest and may make you feel nauseous or bloated.  Likewise meals heavy in dairy and vegetables can cause extra gas to form and this can also increase the bloating.  Drink plenty of fluids but you should avoid alcoholic beverages for 24 hours.  ACTIVITY: Your care partner should take you home directly after the procedure.  You should plan to take it easy, moving slowly for the rest of the day.  You can resume normal activity the day after the procedure however you should NOT DRIVE or use heavy machinery for 24 hours  (because of the sedation medicines used during the test).    SYMPTOMS TO REPORT IMMEDIATELY: A gastroenterologist can be reached at any hour.  During normal business hours, 8:30 AM to 5:00 PM Monday through Friday, call [phone].  After hours and on weekends, please call the GI answering service at [phone] who will take a message and have the physician on call contact you.   Following lower endoscopy (colonoscopy or flexible sigmoidoscopy):  Excessive amounts of blood in the stool  Significant tenderness or worsening of abdominal pains  Swelling of the abdomen that is new, acute  Fever of 100F or higher FOLLOW UP: If any biopsies were taken you will be contacted by phone or by letter within the next 1-3 weeks.  Call your gastroenterologist if you have not heard about the biopsies in 3 weeks.  Our staff will call the home number listed on your records the next business day following your procedure to check on you and address any questions or concerns that you may have at that time regarding the information given to you following your procedure. This is a courtesy call and so if there is no answer at the home number and we have not heard from you through the emergency physician on call, we will assume that you have returned to your regular daily activities without incident.  SIGNATURES/CONFIDENTIALITY: You and/or your care partner have signed paperwork which will be entered into your electronic medical record.  These signatures attest to the  fact that that the information above on your After Visit Summary has been reviewed and is understood.  Full responsibility of the confidentiality of this discharge information lies with you and/or your care-partner.

## 2013-09-27 NOTE — Progress Notes (Addendum)
30 Called to room to assist during endoscopic procedure.  Patient ID and intended procedure confirmed with present staff. Received instructions for my participation in the procedure from the performing physician.

## 2013-09-27 NOTE — Progress Notes (Signed)
Patient stated to Dr. Raquel James to speak to responsible party concerning results. Responsible party brought back to conference room to speak with Dr. Hilarie Fredrickson per patient request.

## 2013-09-27 NOTE — Op Note (Signed)
[facility]  [address] [place], [address]   COLONOSCOPY PROCEDURE REPORT  PATIENT: Andrea, Morgan  MR#: [id number] BIRTHDATE: 11-Dec-1950 , 78  yrs. old GENDER: Female ENDOSCOPIST: Jerene Bears, MD REFERRED GU:[id number] Ardeth Perfect, M.D. PROCEDURE DATE:  09/27/2013 PROCEDURE:   Colonoscopy with biopsy, Colonoscopy with snare polypectomy, and Submucosal injection, any substance First Screening Colonoscopy - Avg.  risk and is 50 yrs.  old or older Yes.  Prior Negative Screening - Now for repeat screening. N/A  History of Adenoma - Now for follow-up colonoscopy & has been > or = to 3 yrs.  N/A  Polyps Removed Today? Yes. ASA CLASS:   Class III INDICATIONS:average risk screening and first colonoscopy. MEDICATIONS: MAC sedation, administered by CRNA and propofol (Diprivan) 500mg  IV  DESCRIPTION OF PROCEDURE:   After the risks benefits and alternatives of the procedure were thoroughly explained, informed consent was obtained.  A digital rectal exam revealed several skin tags.   The LB [id number]  endoscope was introduced through the anus and advanced to the cecum, which was identified by both the appendix and ileocecal valve. No adverse events experienced. The quality of the prep was good, using MoviPrep  The instrument was then slowly withdrawn as the colon was fully examined.      COLON FINDINGS: A sessile, laterally spreading,  polyp measuring 25-30 mm in size was found at the hepatic flexure. This polyp crosses 2 folds. Multiple biopsies of the lesion were performed using cold forceps.  A tattoo was applied on the proximal and distal sides ( 6 CC spot).   Three sessile polyps measuring 4-7 mm in size were found in the ascending colon and transverse colon. Polypectomy was performed using cold snare.  All resections were complete and all polyp tissue was completely retrieved.   A flat polyp measuring 15-20 mm in size was found in the proximal transverse  colon.  Endoscopic mucosal resection was performed by injecting saline and methylene blue into the submucosa to raise the lesion and polypectomy was performed with snare cautery.  The polyp lifted well after submucosal injection The polyp lifted well after submucosal injection.  The resection was complete and the polyp tissue was completely retrieved.  There was no 'target sign' at EMR site.  There was no blood loss from polypectomy.  The polypectomy site was closed by placing hemoclips.  Two (2) placements were made for prophylaxis of bleeding.  Three sessile polyps measuring 4-6 mm in size were found in the descending colon, sigmoid colon, and rectosigmoid colon.  Polypectomy was performed using cold snare. All resections were complete and all polyp tissue was completely retrieved.   Mild diverticulosis was noted in the descending colon and sigmoid colon.   Two polyps measuring 5-6 mm in size were found in the anal canal.  Retroflexed views revealed internal hemorrhoids and anal polyps. The time to cecum=5 minutes 08 seconds. Withdrawal time=26 minutes 24 seconds.  The scope was withdrawn and the procedure completed.  COMPLICATIONS: There were no complications.  ENDOSCOPIC IMPRESSION: 1.   Sessile polyp measuring 25-30 mm in size was found at the hepatic flexure; multiple biopsies of the lesion were performed; a tattoo was applied 2.   Three sessile polyps measuring 4-7 mm in size were found in the ascending colon and transverse colon; Polypectomy was performed using cold snare 3.   Flat polyp measuring 15-20 mm in size was found in the proximal transverse colon; endoscopic mucosal resection was performed; the polypectomy  site was closed by placing hemoclips 4.   Three sessile polyps measuring 4-6 mm in size were found in the descending colon, sigmoid colon, and rectosigmoid colon; Polypectomy was performed using cold snare 5.   Mild diverticulosis was noted in the descending  colon and sigmoid colon 6.   Two anal canal polyps measuring 5-6 mm in size were found, along with internal hemorrhoids  RECOMMENDATIONS: 1.  Hold aspirin, aspirin products, and anti-inflammatory medication for 2 weeks. 2.  Await pathology results 3.  Surgical referral for segmental resection of hepatic flexure polyp and evaluation of anal polyps (exclusion of AIN) 4.  High fiber diet 5.  Timing of repeat colonoscopy will be determined by pathology findings. 6.  You will receive a letter within 1-2 weeks with the results of your biopsy as well as final recommendations.  Please call my office if you have not received a letter after 3 weeks.   eSigned:  Jerene Bears, MD 09/27/2013 10:48 AM   cc: The Patient; [name], M.D.   PATIENT NAME:  Andrea, Morgan MR#: [id number]

## 2013-09-28 ENCOUNTER — Telehealth: Payer: Self-pay | Admitting: *Deleted

## 2013-09-28 NOTE — Telephone Encounter (Signed)
  Follow up Call-  Call back number 09/27/2013  Post procedure Call Back phone  # [phone]  Permission to leave phone message Yes     Patient questions:  Do you have a fever, pain , or abdominal swelling? no Pain Score  0 *  Have you tolerated food without any problems? yes  Have you been able to return to your normal activities? yes  Do you have any questions about your discharge instructions: Diet   no Medications  no Follow up visit  no  Do you have questions or concerns about your Care? no  Actions: * If pain score is 4 or above: No action needed, pain <4.

## 2013-10-01 ENCOUNTER — Ambulatory Visit
Admission: RE | Admit: 2013-10-01 | Discharge: 2013-10-01 | Disposition: A | Discharge status: Home / Self Care | Payer: Medicare Other | Source: Ambulatory Visit | Attending: Internal Medicine | Admitting: Internal Medicine

## 2013-10-01 DIAGNOSIS — G4700 Insomnia, unspecified: Secondary | ICD-10-CM | POA: Diagnosis not present

## 2013-10-01 DIAGNOSIS — Z0000 Encounter for general adult medical examination without abnormal findings: Secondary | ICD-10-CM | POA: Diagnosis not present

## 2013-10-01 DIAGNOSIS — F329 Major depressive disorder, single episode, unspecified: Secondary | ICD-10-CM | POA: Diagnosis not present

## 2013-10-01 DIAGNOSIS — F3289 Other specified depressive episodes: Secondary | ICD-10-CM | POA: Diagnosis not present

## 2013-10-01 DIAGNOSIS — Z8249 Family history of ischemic heart disease and other diseases of the circulatory system: Secondary | ICD-10-CM | POA: Diagnosis not present

## 2013-10-01 DIAGNOSIS — N63 Unspecified lump in unspecified breast: Secondary | ICD-10-CM | POA: Diagnosis not present

## 2013-10-01 DIAGNOSIS — E785 Hyperlipidemia, unspecified: Secondary | ICD-10-CM | POA: Diagnosis not present

## 2013-10-01 DIAGNOSIS — N644 Mastodynia: Secondary | ICD-10-CM

## 2013-10-01 DIAGNOSIS — J449 Chronic obstructive pulmonary disease, unspecified: Secondary | ICD-10-CM | POA: Diagnosis not present

## 2013-10-01 DIAGNOSIS — K219 Gastro-esophageal reflux disease without esophagitis: Secondary | ICD-10-CM | POA: Diagnosis not present

## 2013-10-01 DIAGNOSIS — E039 Hypothyroidism, unspecified: Secondary | ICD-10-CM | POA: Diagnosis not present

## 2013-10-02 ENCOUNTER — Encounter: Payer: Self-pay | Admitting: Internal Medicine

## 2013-10-17 DIAGNOSIS — I480 Paroxysmal atrial fibrillation: Secondary | ICD-10-CM | POA: Diagnosis present

## 2013-10-17 HISTORY — DX: Paroxysmal atrial fibrillation: I48.0

## 2013-10-22 ENCOUNTER — Ambulatory Visit (INDEPENDENT_AMBULATORY_CARE_PROVIDER_SITE_OTHER): Payer: Medicare Other | Admitting: General Surgery

## 2013-10-22 ENCOUNTER — Encounter (INDEPENDENT_AMBULATORY_CARE_PROVIDER_SITE_OTHER): Payer: Self-pay | Admitting: General Surgery

## 2013-10-22 VITALS — BP 122/70 | HR 74 | Temp 98.1°F | Resp 16 | Ht 66.5 in | Wt 172.2 lb

## 2013-10-22 DIAGNOSIS — D126 Benign neoplasm of colon, unspecified: Secondary | ICD-10-CM

## 2013-10-22 DIAGNOSIS — K635 Polyp of colon: Secondary | ICD-10-CM

## 2013-10-22 MED ORDER — METRONIDAZOLE 500 MG PO TABS: 500.0000 mg | ORAL_TABLET | ORAL | Status: DC

## 2013-10-22 MED ORDER — NEOMYCIN SULFATE 500 MG PO TABS: 1000.0000 mg | ORAL_TABLET | ORAL | Status: AC

## 2013-10-22 NOTE — Addendum Note (Signed)
Addended by: [name] on: 04/22/6429 12:17 PM   Modules accepted: Orders

## 2013-10-22 NOTE — Progress Notes (Signed)
Chief Complaint  Patient presents with  . Colon Polyps    HISTORY: Andrea Morgan is a 63 y.o. female who presents to the office with a colon polyp that was found on screening colonoscopy.  She was also found to have some anal polyps that were not biopsied, as well as other colon polyps that were removed.  She denies any rectal bleeding.  She has occasional lower abd cramps.  Denies weight loss.   She has a FH of colon cancer.  Past Medical History  Diagnosis Date  . Insomnia   . Chronic headaches   . Allergic rhinitis   . Depression   . Depression   . PONV (postoperative nausea and vomiting)   . Shortness of breath     with exercsie  . Cancer   . Anxiety   . GERD (gastroesophageal reflux disease)   . H/O hiatal hernia   . History of kidney stones   . Anemia   . COPD (chronic obstructive pulmonary disease)   . Emphysema of lung   . Hyperlipidemia       Past Surgical History  Procedure Laterality Date  . Appendectomy  1978  . Tonsillectomy  1971  . Cervical fusion  1997  . Video bronchoscopy  05/11/2011    Procedure: VIDEO BRONCHOSCOPY WITH FLUORO;  Surgeon: Collene Gobble, MD;  Location: [facility];  Service: Cardiopulmonary;  Laterality: Bilateral;  . Cervical fusion    . Esophagogastroduodenoscopy  05/21/2011    Procedure: ESOPHAGOGASTRODUODENOSCOPY (EGD);  Surgeon: Beryle Beams, MD;  Location: [facility];  Service: Endoscopy;  Laterality: N/A;  . Video bronchoscopy  06/02/2011    Procedure: VIDEO BRONCHOSCOPY;  Surgeon: Grace Isaac, MD;  Location: [facility];  Service: Thoracic;  Laterality: N/A;  . Mass excision  06/02/2011    Procedure: EXCISION MASS;  Surgeon: Grace Isaac, MD;  Location: [facility];  Service: Thoracic;;      Current Outpatient Prescriptions  Medication Sig Dispense Refill  . albuterol (PROVENTIL) (2.5 MG/3ML) 0.083% nebulizer solution Take 2.5 mg by nebulization as needed for wheezing or shortness of breath.      Marland Kitchen atorvastatin (LIPITOR)  20 MG tablet Take 20 mg by mouth daily.      Marland Kitchen zolpidem (AMBIEN) 10 MG tablet Take 10 mg by mouth at bedtime as needed for sleep.       No current facility-administered medications for this visit.      Allergies  Allergen Reactions  . Sulfa Antibiotics Hives      Family History  Problem Relation Age of Onset  . Asthma Mother   . Heart disease Mother   . Asthma Maternal Grandmother   . Lung cancer Father   . Cancer Father   . Ovarian cancer Maternal Aunt   . Breast cancer Maternal Aunt     x2  . Prostate cancer Maternal Uncle   . Kidney cancer Sister   . Anesthesia problems Neg Hx   . Colon cancer Brother       History   Social History  . Marital Status: Divorced    Spouse Name: N/A    Number of Children: N/A  . Years of Education: N/A   Occupational History  . caregiver [employer]   Social History Main Topics  . Smoking status: Former Smoker -- 1.00 packs/day for 45 years    Types: Cigarettes    Quit date: 04/14/2011  . Smokeless tobacco: Never Used  . Alcohol Use: No  .  Drug Use: Yes    Special: Marijuana  . Sexual Activity: None   Other Topics Concern  . None   Social History Narrative  . None       REVIEW OF SYSTEMS - PERTINENT POSITIVES ONLY: Review of Systems - General ROS: negative for - chills or fever Respiratory ROS: no cough, shortness of breath, or wheezing Cardiovascular ROS: no chest pain or dyspnea on exertion Gastrointestinal ROS: see above Genito-Urinary ROS: no dysuria, trouble voiding, or hematuria  EXAM: Filed Vitals:   10/22/13 1053  BP: 122/70  Pulse: 74  Temp: 98.1 F (36.7 C)  Resp: 16    Gen:  No acute distress.  Well nourished and well groomed.   Neurological: Alert and oriented to person, place, and time. Coordination normal.  Head: Normocephalic and atraumatic.  Eyes: Conjunctivae are normal. Pupils are equal, round, and reactive to light. No scleral icterus.  Neck: Normal range of motion. Neck supple. No  tracheal deviation or thyromegaly present.  No cervical lymphadenopathy. Cardiovascular: Normal rate, regular rhythm, normal heart sounds and intact distal pulses. Respiratory: Effort normal.  No respiratory distress. No chest wall tenderness. Breath sounds normal.  No wheezes, rales or rhonchi.  GI: Soft. Bowel sounds are normal. The abdomen is soft and nontender.  There is no rebound and no guarding.  Musculoskeletal: Normal range of motion. Extremities are nontender.  Skin: Skin is warm and dry. No rash noted. No diaphoresis. No erythema. No pallor. No clubbing, cyanosis, or edema.   Psychiatric: Normal mood and affect. Behavior is normal. Judgment and thought content normal.   Pathology 2. Surgical [P], hepatic flexure, biopsy - FRAGMENTS OF TUBULOVILLOUS ADENOMA. - NO HIGH GRADE DYSPLASIA OR MALIGNANCY, SEE COMMENT.  LABORATORY RESULTS: Available labs are reviewed  No results found for this basename: CEA     ASSESSMENT AND PLAN: Andrea Morgan is a 63 y.o. F with colon polyps that presents to the office with an endoscopically unresectable hepatic flexure polyp.  Biopsy reveal TVA with not HGD.  The area was tattooed.  We discussed her options which include following the area endoscopically, endoscopic mucosal resection and surgical resection.  She has elected surgical resection because she does not want to undergo multiple colonoscopies over the next year.  She is reluctant to schedule surgery though due to finances.  She would like to wait until next month to do this.  I think this is reasonable.  She was given bowel prep instructions and prescriptions for preoperative antibiotics.   The surgery and anatomy were described to the patient in detail, as well as the risks of surgery and the possible complications.  These include: Bleeding, deep abdominal infections and possible wound complications such as hernia and infection, damage to adjacent structures, leak of surgical connections,  which can lead to other surgeries and possibly an ostomy, possible need for other procedures, such as abscess drains in radiology, possible prolonged hospital stay, possible diarrhea from removal of part of the colon, possible constipation from narcotics, prolonged fatigue/weakness or appetite loss, possible early recurrence of of disease, possible complications of their medical problems such as heart disease or arrhythmias or lung problems, death (less than 1%). I believe the patient understands and wishes to proceed with the surgery.     [name], MD Colon and Rectal Surgery / [facility]      Visit Diagnoses: No diagnosis found.  Primary Care Physician: [name], MD

## 2013-10-22 NOTE — Patient Instructions (Signed)
COLON BOWEL PREP                                                                          °Please follow the instructions carefully. It is important to clean out your bowels & take the prescribed antibiotic pills to lower your chances of a wound infection or abscess.  ° °FIVE DAYS PRIOR TO YOUR SURGERY ° °Stop eating any nuts, popcorn, or fruit with seeds. Stop all fiber supplements such as Metamucil, Citrucel, etc.   °Hold taking any blood thinning anticoagulation medication (ex: aspirin, warfarin/Coumadin, Plavix, Xarelto, Eliquis, Pradaxa, etc) as recommended by your medical/cardiology doctor ° °Obtain what you need at a pharmacy of your choice: °-Filled out prescriptions for your oral antibiotics (Neomycin & Metronidazole)  °-A bottle of MiraLax / Glycolax (288g) - no prescription required  °-A large bottle of Gatorade / Powerade (64oz)  °-Dulcolax tablets (4 tabs) - no prescription required  ° ° °DAY PRIOR TO SURGERY  ° °7:00am °Swallow 4 Dulcolax tablets with some water °Drink plenty of clear liquids all day to avoid getting dehydrated (Water, juice, soda, coffee, tea, bouillon, jello, etc.) ° °10:00am °Mix the bottle of MiraLax with the 64-oz bottle of Gatorade.  Drink the Gatorade mixture gradually over the next few hours (8oz glass every 15-30 minutes) until gone. You should finish by 2pm. ° °2:00pm °Take 2 Neomycin 500mg tablets & 2 Metronidazole 500mg tablets ° °3:00pm °Take 2 Neomycin 500mg tablets & 2 Metronidazole 500mg tablets  °Drink plenty of clear liquids all evening to avoid getting dehydrated ° °10:00pm °Take 2 Neomycin 500mg tablets & 2 Metronidazole 500mg tablets  °Do not eat or drink anything after bedtime (midnight) the night before your surgery. ° ° °MORNING OF SURGERY °Remember to not to drink or eat anything that morning  °Hold or take medications as recommended by the hospital staff at your Preoperative visit ° °If you have questions or concerns, please call [facility] [phone] during business hours to speak to the clinical staff for advice. °

## 2013-11-06 ENCOUNTER — Encounter (HOSPITAL_COMMUNITY): Payer: Self-pay | Admitting: Pharmacy Technician

## 2013-11-07 ENCOUNTER — Encounter (INDEPENDENT_AMBULATORY_CARE_PROVIDER_SITE_OTHER): Payer: Self-pay

## 2013-11-07 ENCOUNTER — Encounter (HOSPITAL_COMMUNITY): Payer: Self-pay

## 2013-11-07 ENCOUNTER — Encounter (HOSPITAL_COMMUNITY)
Admission: RE | Admit: 2013-11-07 | Discharge: 2013-11-07 | Disposition: A | Discharge status: Home / Self Care | Payer: Medicare Other | Source: Ambulatory Visit | Attending: General Surgery | Admitting: General Surgery

## 2013-11-07 DIAGNOSIS — Z01812 Encounter for preprocedural laboratory examination: Secondary | ICD-10-CM

## 2013-11-07 DIAGNOSIS — Z8051 Family history of malignant neoplasm of kidney: Secondary | ICD-10-CM | POA: Diagnosis not present

## 2013-11-07 DIAGNOSIS — Z01810 Encounter for preprocedural cardiovascular examination: Secondary | ICD-10-CM

## 2013-11-07 DIAGNOSIS — A630 Anogenital (venereal) warts: Secondary | ICD-10-CM | POA: Diagnosis not present

## 2013-11-07 DIAGNOSIS — D3500 Benign neoplasm of unspecified adrenal gland: Secondary | ICD-10-CM | POA: Diagnosis present

## 2013-11-07 DIAGNOSIS — D126 Benign neoplasm of colon, unspecified: Secondary | ICD-10-CM | POA: Diagnosis present

## 2013-11-07 DIAGNOSIS — K620 Anal polyp: Secondary | ICD-10-CM | POA: Diagnosis present

## 2013-11-07 DIAGNOSIS — R0602 Shortness of breath: Secondary | ICD-10-CM | POA: Diagnosis not present

## 2013-11-07 DIAGNOSIS — I519 Heart disease, unspecified: Secondary | ICD-10-CM | POA: Diagnosis not present

## 2013-11-07 DIAGNOSIS — Z801 Family history of malignant neoplasm of trachea, bronchus and lung: Secondary | ICD-10-CM | POA: Diagnosis not present

## 2013-11-07 DIAGNOSIS — Z825 Family history of asthma and other chronic lower respiratory diseases: Secondary | ICD-10-CM | POA: Diagnosis not present

## 2013-11-07 DIAGNOSIS — K648 Other hemorrhoids: Secondary | ICD-10-CM | POA: Diagnosis present

## 2013-11-07 DIAGNOSIS — K6289 Other specified diseases of anus and rectum: Secondary | ICD-10-CM | POA: Diagnosis present

## 2013-11-07 DIAGNOSIS — J4489 Other specified chronic obstructive pulmonary disease: Secondary | ICD-10-CM | POA: Diagnosis not present

## 2013-11-07 DIAGNOSIS — Z8249 Family history of ischemic heart disease and other diseases of the circulatory system: Secondary | ICD-10-CM | POA: Diagnosis not present

## 2013-11-07 DIAGNOSIS — D371 Neoplasm of uncertain behavior of stomach: Secondary | ICD-10-CM | POA: Diagnosis not present

## 2013-11-07 DIAGNOSIS — F3289 Other specified depressive episodes: Secondary | ICD-10-CM | POA: Diagnosis present

## 2013-11-07 DIAGNOSIS — Z803 Family history of malignant neoplasm of breast: Secondary | ICD-10-CM | POA: Diagnosis not present

## 2013-11-07 DIAGNOSIS — F410 Panic disorder [episodic paroxysmal anxiety] without agoraphobia: Secondary | ICD-10-CM | POA: Diagnosis present

## 2013-11-07 DIAGNOSIS — Z800 Family history of malignant neoplasm of digestive organs: Secondary | ICD-10-CM | POA: Diagnosis not present

## 2013-11-07 DIAGNOSIS — J438 Other emphysema: Secondary | ICD-10-CM | POA: Diagnosis present

## 2013-11-07 DIAGNOSIS — Z87891 Personal history of nicotine dependence: Secondary | ICD-10-CM | POA: Diagnosis not present

## 2013-11-07 DIAGNOSIS — F411 Generalized anxiety disorder: Secondary | ICD-10-CM | POA: Diagnosis present

## 2013-11-07 DIAGNOSIS — M129 Arthropathy, unspecified: Secondary | ICD-10-CM | POA: Diagnosis present

## 2013-11-07 DIAGNOSIS — Z87442 Personal history of urinary calculi: Secondary | ICD-10-CM | POA: Diagnosis not present

## 2013-11-07 DIAGNOSIS — Z8041 Family history of malignant neoplasm of ovary: Secondary | ICD-10-CM | POA: Diagnosis not present

## 2013-11-07 DIAGNOSIS — Z981 Arthrodesis status: Secondary | ICD-10-CM | POA: Diagnosis not present

## 2013-11-07 DIAGNOSIS — E785 Hyperlipidemia, unspecified: Secondary | ICD-10-CM | POA: Diagnosis present

## 2013-11-07 DIAGNOSIS — Z79899 Other long term (current) drug therapy: Secondary | ICD-10-CM | POA: Diagnosis not present

## 2013-11-07 DIAGNOSIS — Z881 Allergy status to other antibiotic agents status: Secondary | ICD-10-CM | POA: Diagnosis not present

## 2013-11-07 DIAGNOSIS — E7800 Pure hypercholesterolemia, unspecified: Secondary | ICD-10-CM | POA: Diagnosis present

## 2013-11-07 DIAGNOSIS — K219 Gastro-esophageal reflux disease without esophagitis: Secondary | ICD-10-CM | POA: Diagnosis present

## 2013-11-07 DIAGNOSIS — Z85118 Personal history of other malignant neoplasm of bronchus and lung: Secondary | ICD-10-CM | POA: Diagnosis not present

## 2013-11-07 DIAGNOSIS — Y832 Surgical operation with anastomosis, bypass or graft as the cause of abnormal reaction of the patient, or of later complication, without mention of misadventure at the time of the procedure: Secondary | ICD-10-CM | POA: Diagnosis not present

## 2013-11-07 DIAGNOSIS — I4891 Unspecified atrial fibrillation: Secondary | ICD-10-CM | POA: Diagnosis not present

## 2013-11-07 HISTORY — DX: Unspecified osteoarthritis, unspecified site: M19.90

## 2013-11-07 LAB — CBC
HCT: 36.2 % (ref 36.0–46.0)
Hemoglobin: 12.1 g/dL (ref 12.0–15.0)
MCH: 27.9 pg (ref 26.0–34.0)
MCHC: 33.4 g/dL (ref 30.0–36.0)
MCV: 83.4 fL (ref 78.0–100.0)
Platelets: 303 10*3/uL (ref 150–400)
RBC: 4.34 MIL/uL (ref 3.87–5.11)
RDW: 15.6 % — AB (ref 11.5–15.5)
WBC: 7.7 10*3/uL (ref 4.0–10.5)

## 2013-11-07 LAB — ABO/RH: ABO/RH(D): A NEG

## 2013-11-07 LAB — BASIC METABOLIC PANEL
Anion gap: 9 (ref 5–15)
BUN: 10 mg/dL (ref 6–23)
CO2: 27 mEq/L (ref 19–32)
CREATININE: 0.74 mg/dL (ref 0.50–1.10)
Calcium: 9.1 mg/dL (ref 8.4–10.5)
Chloride: 105 mEq/L (ref 96–112)
GFR calc non Af Amer: 87 mL/min — ABNORMAL LOW (ref >90)
Glucose, Bld: 96 mg/dL (ref 70–99)
Potassium: 4.2 mEq/L (ref 3.7–5.3)
Sodium: 141 mEq/L (ref 137–147)

## 2013-11-07 LAB — HEMOGLOBIN A1C
HEMOGLOBIN A1C: 5.7 % — AB (ref <5.7)
MEAN PLASMA GLUCOSE: 117 mg/dL — AB (ref <117)

## 2013-11-07 NOTE — Pre-Procedure Instructions (Signed)
CT CHEST REPORT IN EPIC FROM [id number].  EKG WAS DONE TODAY - PREOP AT [facility].

## 2013-11-07 NOTE — Patient Instructions (Signed)
Follow your bowel prep instructions today from Dr. Marcello Moores' office  [place]  ON:  Tomorrow - Thursday  7/23  REPORT TO  SHORT STAY CENTER AT:  10:30 am   [place] [facility] ENTRANCE AND FOLLOW SIGNS TO SHORT STAY CENTER.  DO NOT EAT OR DRINK ANYTHING AFTER MIDNIGHT THE NIGHT BEFORE YOUR SURGERY.  YOU MAY BRUSH YOUR TEETH, RINSE OUT YOUR MOUTH--BUT NO WATER, NO FOOD, NO CHEWING GUM, NO MINTS, NO CANDIES, NO CHEWING TOBACCO.  PLEASE TAKE THE FOLLOWING MEDICATIONS THE AM OF YOUR SURGERY WITH A FEW SIPS OF WATER:  No meds to take - but use your albuterol inhaler please   DO NOT BRING VALUABLES, MONEY, CREDIT CARDS.  DO NOT WEAR JEWELRY, MAKE-UP, NAIL POLISH AND NO METAL PINS OR CLIPS IN YOUR HAIR. CONTACT LENS, DENTURES / PARTIALS, GLASSES SHOULD NOT BE WORN TO SURGERY AND IN MOST CASES-HEARING AIDS WILL NEED TO BE REMOVED.  BRING YOUR GLASSES CASE, ANY EQUIPMENT NEEDED FOR YOUR CONTACT LENS. FOR PATIENTS ADMITTED TO THE HOSPITAL--CHECK OUT TIME THE DAY OF DISCHARGE IS 11:00 AM.  ALL INPATIENT ROOMS ARE PRIVATE - WITH BATHROOM, TELEPHONE, TELEVISION AND WIFI INTERNET.                                                    PLEASE READ OVER ANY  FACT SHEETS THAT YOU WERE GIVEN: MRSA INFORMATION, BLOOD TRANSFUSION INFORMATION, INCENTIVE SPIROMETER INFORMATION.  PLEASE BE AWARE THAT YOU MAY NEED ADDITIONAL BLOOD DRAWN DAY OF YOUR SURGERY  _______________________________________________________________________   [facility] - Preparing for Surgery Before surgery, you can play an important role.  Because skin is not sterile, your skin needs to be as free of germs as possible.  You can reduce the number of germs on your skin by washing with CHG (chlorahexidine gluconate) soap before surgery.  CHG is an antiseptic cleaner which kills germs and bonds with the skin to continue killing germs even after washing. Please DO NOT use if you have  an allergy to CHG or antibacterial soaps.  If your skin becomes reddened/irritated stop using the CHG and inform your nurse when you arrive at Short Stay. Do not shave (including legs and underarms) for at least 48 hours prior to the first CHG shower.  You may shave your face/neck. Please follow these instructions carefully:  1.  Shower with CHG Soap the night before surgery and the  morning of Surgery.  2.  If you choose to wash your hair, wash your hair first as usual with your  normal  shampoo.  3.  After you shampoo, rinse your hair and body thoroughly to remove the  shampoo.                           4.  Use CHG as you would any other liquid soap.  You can apply chg directly  to the skin and wash                       Gently with a scrungie or clean washcloth.  5.  Apply the CHG Soap to your body ONLY FROM THE NECK DOWN.   Do not use on face/ open  Wound or open sores. Avoid contact with eyes, ears mouth and genitals (private parts).                       Wash face,  Genitals (private parts) with your normal soap.             6.  Wash thoroughly, paying special attention to the area where your surgery  will be performed.  7.  Thoroughly rinse your body with warm water from the neck down.  8.  DO NOT shower/wash with your normal soap after using and rinsing off  the CHG Soap.                9.  Pat yourself dry with a clean towel.            10.  Wear clean pajamas.            11.  Place clean sheets on your bed the night of your first shower and do not  sleep with pets. Day of Surgery : Do not apply any lotions/deodorants the morning of surgery.  Please wear clean clothes to the hospital/surgery center.  FAILURE TO FOLLOW THESE INSTRUCTIONS MAY RESULT IN THE CANCELLATION OF YOUR SURGERY PATIENT SIGNATURE_________________________________  NURSE  SIGNATURE__________________________________  ________________________________________________________________________  WHAT IS A BLOOD TRANSFUSION? Blood Transfusion Information  A transfusion is the replacement of blood or some of its parts. Blood is made up of multiple cells which provide different functions.  Red blood cells carry oxygen and are used for blood loss replacement.  White blood cells fight against infection.  Platelets control bleeding.  Plasma helps clot blood.  Other blood products are available for specialized needs, such as hemophilia or other clotting disorders. BEFORE THE TRANSFUSION  Who gives blood for transfusions?   Healthy volunteers who are fully evaluated to make sure their blood is safe. This is blood bank blood. Transfusion therapy is the safest it has ever been in the practice of medicine. Before blood is taken from a donor, a complete history is taken to make sure that person has no history of diseases nor engages in risky social behavior (examples are intravenous drug use or sexual activity with multiple partners). The donor's travel history is screened to minimize risk of transmitting infections, such as malaria. The donated blood is tested for signs of infectious diseases, such as HIV and hepatitis. The blood is then tested to be sure it is compatible with you in order to minimize the chance of a transfusion reaction. If you or a relative donates blood, this is often done in anticipation of surgery and is not appropriate for emergency situations. It takes many days to process the donated blood. RISKS AND COMPLICATIONS Although transfusion therapy is very safe and saves many lives, the main dangers of transfusion include:   Getting an infectious disease.  Developing a transfusion reaction. This is an allergic reaction to something in the blood you were given. Every precaution is taken to prevent this. The decision to have a blood transfusion has been  considered carefully by your caregiver before blood is given. Blood is not given unless the benefits outweigh the risks. AFTER THE TRANSFUSION  Right after receiving a blood transfusion, you will usually feel much better and more energetic. This is especially true if your red blood cells have gotten low (anemic). The transfusion raises the level of the red blood cells which carry oxygen, and this usually causes an energy increase.  The nurse administering the transfusion will monitor you carefully for complications. HOME CARE INSTRUCTIONS  No special instructions are needed after a transfusion. You may find your energy is better. Speak with your caregiver about any limitations on activity for underlying diseases you may have. SEEK MEDICAL CARE IF:   Your condition is not improving after your transfusion.  You develop redness or irritation at the intravenous (IV) site. SEEK IMMEDIATE MEDICAL CARE IF:  Any of the following symptoms occur over the next 12 hours:  Shaking chills.  You have a temperature by mouth above 102 F (38.9 C), not controlled by medicine.  Chest, back, or muscle pain.  People around you feel you are not acting correctly or are confused.  Shortness of breath or difficulty breathing.  Dizziness and fainting.  You get a rash or develop hives.  You have a decrease in urine output.  Your urine turns a dark color or changes to pink, red, or brown. Any of the following symptoms occur over the next 10 days:  You have a temperature by mouth above 102 F (38.9 C), not controlled by medicine.  Shortness of breath.  Weakness after normal activity.  The white part of the eye turns yellow (jaundice).  You have a decrease in the amount of urine or are urinating less often.  Your urine turns a dark color or changes to pink, red, or brown. Document Released: 04/02/2000 Document Revised: 06/28/2011 Document Reviewed: 11/20/2007 [facility] Patient Information 2014  [place], [place].  _______________________________________________________________________

## 2013-11-08 ENCOUNTER — Encounter (HOSPITAL_COMMUNITY): Payer: Self-pay | Admitting: *Deleted

## 2013-11-08 ENCOUNTER — Encounter (HOSPITAL_COMMUNITY)
Admission: RE | Disposition: A | Discharge status: Home / Self Care | Payer: Self-pay | Source: Ambulatory Visit | Attending: General Surgery

## 2013-11-08 ENCOUNTER — Encounter (HOSPITAL_COMMUNITY): Payer: Medicare Other | Admitting: *Deleted

## 2013-11-08 ENCOUNTER — Inpatient Hospital Stay (HOSPITAL_COMMUNITY)
Admission: RE | Admit: 2013-11-08 | Discharge: 2013-11-13 | DRG: 330 | Disposition: A | Discharge status: Home / Self Care | Payer: Medicare Other | Source: Ambulatory Visit | Attending: General Surgery | Admitting: General Surgery

## 2013-11-08 ENCOUNTER — Inpatient Hospital Stay (HOSPITAL_COMMUNITY): Payer: Medicare Other | Admitting: *Deleted

## 2013-11-08 DIAGNOSIS — K648 Other hemorrhoids: Secondary | ICD-10-CM | POA: Diagnosis present

## 2013-11-08 DIAGNOSIS — Z85118 Personal history of other malignant neoplasm of bronchus and lung: Secondary | ICD-10-CM | POA: Diagnosis not present

## 2013-11-08 DIAGNOSIS — Z87891 Personal history of nicotine dependence: Secondary | ICD-10-CM

## 2013-11-08 DIAGNOSIS — D3500 Benign neoplasm of unspecified adrenal gland: Secondary | ICD-10-CM | POA: Diagnosis present

## 2013-11-08 DIAGNOSIS — Y832 Surgical operation with anastomosis, bypass or graft as the cause of abnormal reaction of the patient, or of later complication, without mention of misadventure at the time of the procedure: Secondary | ICD-10-CM | POA: Diagnosis not present

## 2013-11-08 DIAGNOSIS — Z79899 Other long term (current) drug therapy: Secondary | ICD-10-CM | POA: Diagnosis not present

## 2013-11-08 DIAGNOSIS — I4891 Unspecified atrial fibrillation: Secondary | ICD-10-CM | POA: Diagnosis not present

## 2013-11-08 DIAGNOSIS — E785 Hyperlipidemia, unspecified: Secondary | ICD-10-CM | POA: Diagnosis present

## 2013-11-08 DIAGNOSIS — Z803 Family history of malignant neoplasm of breast: Secondary | ICD-10-CM | POA: Diagnosis not present

## 2013-11-08 DIAGNOSIS — J438 Other emphysema: Secondary | ICD-10-CM | POA: Diagnosis present

## 2013-11-08 DIAGNOSIS — K621 Rectal polyp: Secondary | ICD-10-CM

## 2013-11-08 DIAGNOSIS — Z825 Family history of asthma and other chronic lower respiratory diseases: Secondary | ICD-10-CM

## 2013-11-08 DIAGNOSIS — K219 Gastro-esophageal reflux disease without esophagitis: Secondary | ICD-10-CM | POA: Diagnosis present

## 2013-11-08 DIAGNOSIS — K620 Anal polyp: Secondary | ICD-10-CM | POA: Diagnosis present

## 2013-11-08 DIAGNOSIS — F3289 Other specified depressive episodes: Secondary | ICD-10-CM | POA: Diagnosis present

## 2013-11-08 DIAGNOSIS — D126 Benign neoplasm of colon, unspecified: Secondary | ICD-10-CM | POA: Diagnosis not present

## 2013-11-08 DIAGNOSIS — Z01810 Encounter for preprocedural cardiovascular examination: Secondary | ICD-10-CM

## 2013-11-08 DIAGNOSIS — Z8051 Family history of malignant neoplasm of kidney: Secondary | ICD-10-CM

## 2013-11-08 DIAGNOSIS — Z01812 Encounter for preprocedural laboratory examination: Secondary | ICD-10-CM

## 2013-11-08 DIAGNOSIS — Z860100 Personal history of colon polyps, unspecified: Secondary | ICD-10-CM | POA: Diagnosis present

## 2013-11-08 DIAGNOSIS — Z87442 Personal history of urinary calculi: Secondary | ICD-10-CM | POA: Diagnosis not present

## 2013-11-08 DIAGNOSIS — F410 Panic disorder [episodic paroxysmal anxiety] without agoraphobia: Secondary | ICD-10-CM | POA: Diagnosis present

## 2013-11-08 DIAGNOSIS — Z801 Family history of malignant neoplasm of trachea, bronchus and lung: Secondary | ICD-10-CM

## 2013-11-08 DIAGNOSIS — R0602 Shortness of breath: Secondary | ICD-10-CM | POA: Diagnosis not present

## 2013-11-08 DIAGNOSIS — D378 Neoplasm of uncertain behavior of other specified digestive organs: Secondary | ICD-10-CM

## 2013-11-08 DIAGNOSIS — M129 Arthropathy, unspecified: Secondary | ICD-10-CM | POA: Diagnosis present

## 2013-11-08 DIAGNOSIS — K6289 Other specified diseases of anus and rectum: Secondary | ICD-10-CM | POA: Diagnosis present

## 2013-11-08 DIAGNOSIS — F329 Major depressive disorder, single episode, unspecified: Secondary | ICD-10-CM | POA: Diagnosis present

## 2013-11-08 DIAGNOSIS — E7800 Pure hypercholesterolemia, unspecified: Secondary | ICD-10-CM | POA: Diagnosis present

## 2013-11-08 DIAGNOSIS — J449 Chronic obstructive pulmonary disease, unspecified: Secondary | ICD-10-CM | POA: Diagnosis not present

## 2013-11-08 DIAGNOSIS — Z800 Family history of malignant neoplasm of digestive organs: Secondary | ICD-10-CM

## 2013-11-08 DIAGNOSIS — Z881 Allergy status to other antibiotic agents status: Secondary | ICD-10-CM

## 2013-11-08 DIAGNOSIS — I480 Paroxysmal atrial fibrillation: Secondary | ICD-10-CM

## 2013-11-08 DIAGNOSIS — I519 Heart disease, unspecified: Secondary | ICD-10-CM | POA: Diagnosis not present

## 2013-11-08 DIAGNOSIS — F411 Generalized anxiety disorder: Secondary | ICD-10-CM | POA: Diagnosis present

## 2013-11-08 DIAGNOSIS — Z8041 Family history of malignant neoplasm of ovary: Secondary | ICD-10-CM

## 2013-11-08 DIAGNOSIS — D371 Neoplasm of uncertain behavior of stomach: Secondary | ICD-10-CM | POA: Diagnosis not present

## 2013-11-08 DIAGNOSIS — A630 Anogenital (venereal) warts: Secondary | ICD-10-CM | POA: Diagnosis not present

## 2013-11-08 DIAGNOSIS — Z981 Arthrodesis status: Secondary | ICD-10-CM

## 2013-11-08 DIAGNOSIS — Z8249 Family history of ischemic heart disease and other diseases of the circulatory system: Secondary | ICD-10-CM | POA: Diagnosis not present

## 2013-11-08 DIAGNOSIS — D375 Neoplasm of uncertain behavior of rectum: Secondary | ICD-10-CM

## 2013-11-08 HISTORY — PX: LAPAROSCOPIC PARTIAL COLECTOMY: SHX5907

## 2013-11-08 HISTORY — PX: EXAMINATION UNDER ANESTHESIA: SHX1540

## 2013-11-08 HISTORY — DX: Paroxysmal atrial fibrillation: I48.0

## 2013-11-08 LAB — TYPE AND SCREEN
ABO/RH(D): A NEG
Antibody Screen: NEGATIVE

## 2013-11-08 SURGERY — LAPAROSCOPIC PARTIAL COLECTOMY
Anesthesia: General | Site: Anus

## 2013-11-08 MED ORDER — ATORVASTATIN CALCIUM 20 MG PO TABS
20.0000 mg | ORAL_TABLET | Freq: Every day | ORAL | Status: DC
  Administered 2013-11-08 – 2013-11-12 (×5): 20 mg via ORAL
  Filled 2013-11-08 (×7): qty 1

## 2013-11-08 MED ORDER — PHENYLEPHRINE HCL 10 MG/ML IJ SOLN
INTRAMUSCULAR | Status: DC | PRN
  Administered 2013-11-08: 80 ug via INTRAVENOUS
  Administered 2013-11-08 (×2): 40 ug via INTRAVENOUS

## 2013-11-08 MED ORDER — ALVIMOPAN 12 MG PO CAPS
12.0000 mg | ORAL_CAPSULE | Freq: Two times a day (BID) | ORAL | Status: DC
  Administered 2013-11-09 – 2013-11-11 (×6): 12 mg via ORAL
  Filled 2013-11-08 (×9): qty 1

## 2013-11-08 MED ORDER — LACTATED RINGERS IV SOLN
INTRAVENOUS | Status: DC
  Administered 2013-11-08: 17:00:00 via INTRAVENOUS
  Administered 2013-11-09: 75 mL/h via INTRAVENOUS

## 2013-11-08 MED ORDER — GLYCOPYRROLATE 0.2 MG/ML IJ SOLN
INTRAMUSCULAR | Status: AC
  Filled 2013-11-08: qty 5

## 2013-11-08 MED ORDER — ROCURONIUM BROMIDE 100 MG/10ML IV SOLN
INTRAVENOUS | Status: AC
  Filled 2013-11-08: qty 1

## 2013-11-08 MED ORDER — ALBUTEROL SULFATE HFA 108 (90 BASE) MCG/ACT IN AERS: 1.0000 | INHALATION_SPRAY | Freq: Four times a day (QID) | RESPIRATORY_TRACT | Status: DC | PRN

## 2013-11-08 MED ORDER — FENTANYL CITRATE 0.05 MG/ML IJ SOLN
INTRAMUSCULAR | Status: AC
  Filled 2013-11-08: qty 5

## 2013-11-08 MED ORDER — CHLORHEXIDINE GLUCONATE 0.12 % MT SOLN
15.0000 mL | Freq: Two times a day (BID) | OROMUCOSAL | Status: DC
  Administered 2013-11-09 – 2013-11-12 (×6): 15 mL via OROMUCOSAL
  Filled 2013-11-08 (×12): qty 15

## 2013-11-08 MED ORDER — HEPARIN SODIUM (PORCINE) 5000 UNIT/ML IJ SOLN
5000.0000 [IU] | Freq: Once | INTRAMUSCULAR | Status: AC
  Administered 2013-11-08: 5000 [IU] via SUBCUTANEOUS
  Filled 2013-11-08 (×2): qty 1

## 2013-11-08 MED ORDER — DEXTROSE 5 % IV SOLN: 1.0000 g | INTRAVENOUS | Status: DC

## 2013-11-08 MED ORDER — EPHEDRINE SULFATE 50 MG/ML IJ SOLN
INTRAMUSCULAR | Status: DC | PRN
  Administered 2013-11-08: 5 mg via INTRAVENOUS
  Administered 2013-11-08 (×2): 10 mg via INTRAVENOUS

## 2013-11-08 MED ORDER — METOCLOPRAMIDE HCL 5 MG/ML IJ SOLN
INTRAMUSCULAR | Status: DC | PRN
  Administered 2013-11-08: 10 mg via INTRAVENOUS

## 2013-11-08 MED ORDER — DEXTROSE 5 % IV SOLN
2.0000 g | Freq: Two times a day (BID) | INTRAVENOUS | Status: AC
  Administered 2013-11-08: 2 g via INTRAVENOUS
  Filled 2013-11-08: qty 2

## 2013-11-08 MED ORDER — HYDROMORPHONE HCL PF 1 MG/ML IJ SOLN
INTRAMUSCULAR | Status: AC
  Filled 2013-11-08: qty 1

## 2013-11-08 MED ORDER — ENOXAPARIN SODIUM 40 MG/0.4ML ~~LOC~~ SOLN
40.0000 mg | SUBCUTANEOUS | Status: DC
  Administered 2013-11-09 – 2013-11-10 (×2): 40 mg via SUBCUTANEOUS
  Filled 2013-11-08 (×4): qty 0.4

## 2013-11-08 MED ORDER — ONDANSETRON HCL 4 MG/2ML IJ SOLN
INTRAMUSCULAR | Status: DC | PRN
  Administered 2013-11-08: 4 mg via INTRAVENOUS

## 2013-11-08 MED ORDER — BUPIVACAINE-EPINEPHRINE 0.25% -1:200000 IJ SOLN
INTRAMUSCULAR | Status: DC | PRN
  Administered 2013-11-08: 10 mL
  Administered 2013-11-08: 25 mL

## 2013-11-08 MED ORDER — DIPHENHYDRAMINE HCL 50 MG/ML IJ SOLN: 12.5000 mg | Freq: Four times a day (QID) | INTRAMUSCULAR | Status: DC | PRN

## 2013-11-08 MED ORDER — CEFOTETAN DISODIUM 2 G IJ SOLR
2.0000 g | INTRAMUSCULAR | Status: AC
  Administered 2013-11-08: 2 g via INTRAVENOUS
  Filled 2013-11-08: qty 2

## 2013-11-08 MED ORDER — METOCLOPRAMIDE HCL 5 MG/ML IJ SOLN
INTRAMUSCULAR | Status: AC
  Filled 2013-11-08: qty 2

## 2013-11-08 MED ORDER — PROMETHAZINE HCL 25 MG/ML IJ SOLN
6.2500 mg | INTRAMUSCULAR | Status: DC | PRN
Start: 2013-11-08 — End: 2013-11-08

## 2013-11-08 MED ORDER — HYDROMORPHONE HCL PF 2 MG/ML IJ SOLN
INTRAMUSCULAR | Status: AC
  Filled 2013-11-08: qty 1

## 2013-11-08 MED ORDER — MORPHINE SULFATE (PF) 1 MG/ML IV SOLN
INTRAVENOUS | Status: AC
  Filled 2013-11-08: qty 25

## 2013-11-08 MED ORDER — PROPOFOL 10 MG/ML IV BOLUS
INTRAVENOUS | Status: AC
  Filled 2013-11-08: qty 20

## 2013-11-08 MED ORDER — ALBUTEROL SULFATE (2.5 MG/3ML) 0.083% IN NEBU: 2.5000 mg | INHALATION_SOLUTION | Freq: Four times a day (QID) | RESPIRATORY_TRACT | Status: DC | PRN

## 2013-11-08 MED ORDER — LACTATED RINGERS IR SOLN
Status: DC | PRN
  Administered 2013-11-08: 1000 mL

## 2013-11-08 MED ORDER — BIOTENE DRY MOUTH MT LIQD
15.0000 mL | Freq: Two times a day (BID) | OROMUCOSAL | Status: DC
  Administered 2013-11-09 – 2013-11-11 (×5): 15 mL via OROMUCOSAL

## 2013-11-08 MED ORDER — DIPHENHYDRAMINE HCL 12.5 MG/5ML PO ELIX: 12.5000 mg | ORAL_SOLUTION | Freq: Four times a day (QID) | ORAL | Status: DC | PRN

## 2013-11-08 MED ORDER — ONDANSETRON HCL 4 MG/2ML IJ SOLN
INTRAMUSCULAR | Status: AC
  Filled 2013-11-08: qty 2

## 2013-11-08 MED ORDER — NALOXONE HCL 0.4 MG/ML IJ SOLN: 0.4000 mg | INTRAMUSCULAR | Status: DC | PRN

## 2013-11-08 MED ORDER — ALVIMOPAN 12 MG PO CAPS
12.0000 mg | ORAL_CAPSULE | Freq: Once | ORAL | Status: AC
  Administered 2013-11-08: 12 mg via ORAL
  Filled 2013-11-08: qty 1

## 2013-11-08 MED ORDER — EPHEDRINE SULFATE 50 MG/ML IJ SOLN
INTRAMUSCULAR | Status: AC
  Filled 2013-11-08: qty 1

## 2013-11-08 MED ORDER — SODIUM CHLORIDE 0.9 % IJ SOLN: 9.0000 mL | INTRAMUSCULAR | Status: DC | PRN

## 2013-11-08 MED ORDER — FENTANYL CITRATE 0.05 MG/ML IJ SOLN
INTRAMUSCULAR | Status: AC
  Filled 2013-11-08: qty 2

## 2013-11-08 MED ORDER — HYDROMORPHONE HCL PF 1 MG/ML IJ SOLN
INTRAMUSCULAR | Status: DC | PRN
  Administered 2013-11-08 (×2): 1 mg via INTRAVENOUS

## 2013-11-08 MED ORDER — ROCURONIUM BROMIDE 100 MG/10ML IV SOLN
INTRAVENOUS | Status: DC | PRN
  Administered 2013-11-08: 50 mg via INTRAVENOUS
  Administered 2013-11-08 (×2): 5 mg via INTRAVENOUS

## 2013-11-08 MED ORDER — OMEPRAZOLE MAGNESIUM 20 MG PO TBEC
20.0000 mg | DELAYED_RELEASE_TABLET | Freq: Every day | ORAL | Status: DC
  Administered 2013-11-08 – 2013-11-09 (×2): 20 mg via ORAL
  Filled 2013-11-08 (×3): qty 1

## 2013-11-08 MED ORDER — FENTANYL CITRATE 0.05 MG/ML IJ SOLN
INTRAMUSCULAR | Status: DC | PRN
  Administered 2013-11-08: 50 ug via INTRAVENOUS
  Administered 2013-11-08 (×2): 100 ug via INTRAVENOUS
  Administered 2013-11-08: 50 ug via INTRAVENOUS

## 2013-11-08 MED ORDER — BUPIVACAINE-EPINEPHRINE 0.25% -1:200000 IJ SOLN
INTRAMUSCULAR | Status: AC
  Filled 2013-11-08: qty 1

## 2013-11-08 MED ORDER — GLYCOPYRROLATE 0.2 MG/ML IJ SOLN
INTRAMUSCULAR | Status: DC | PRN
  Administered 2013-11-08 (×2): 0.1 mg via INTRAVENOUS
  Administered 2013-11-08: .8 mg via INTRAVENOUS

## 2013-11-08 MED ORDER — NEOSTIGMINE METHYLSULFATE 10 MG/10ML IV SOLN
INTRAVENOUS | Status: AC
  Filled 2013-11-08: qty 1

## 2013-11-08 MED ORDER — ACETAMINOPHEN 500 MG PO TABS
1000.0000 mg | ORAL_TABLET | Freq: Four times a day (QID) | ORAL | Status: AC
  Administered 2013-11-08 – 2013-11-09 (×3): 1000 mg via ORAL
  Filled 2013-11-08 (×4): qty 2

## 2013-11-08 MED ORDER — MORPHINE SULFATE (PF) 1 MG/ML IV SOLN
INTRAVENOUS | Status: DC
  Administered 2013-11-08 – 2013-11-09 (×2): via INTRAVENOUS
  Administered 2013-11-09: 13.5 mg via INTRAVENOUS
  Administered 2013-11-09: 4.5 mg via INTRAVENOUS
  Administered 2013-11-09: 7.14 mg via INTRAVENOUS
  Administered 2013-11-09: 9 mg via INTRAVENOUS
  Administered 2013-11-10: 3 mg via INTRAVENOUS
  Administered 2013-11-10: 14.2 mg via INTRAVENOUS
  Administered 2013-11-10: 1.5 mg via INTRAVENOUS
  Administered 2013-11-10: 9 mg via INTRAVENOUS
  Administered 2013-11-10: 7.5 mg via INTRAVENOUS
  Administered 2013-11-10: 12:00:00 via INTRAVENOUS
  Administered 2013-11-11: 11.74 mg via INTRAVENOUS
  Administered 2013-11-11: 17:00:00 via INTRAVENOUS
  Administered 2013-11-11: 7.5 mg via INTRAVENOUS
  Administered 2013-11-11: 1.5 mg via INTRAVENOUS
  Administered 2013-11-11: 4.5 mg via INTRAVENOUS
  Administered 2013-11-11: 05:00:00 via INTRAVENOUS
  Administered 2013-11-11: 2.7 mg via INTRAVENOUS
  Administered 2013-11-11: 12 mg via INTRAVENOUS
  Administered 2013-11-12: 3 mg via INTRAVENOUS
  Filled 2013-11-08 (×6): qty 25

## 2013-11-08 MED ORDER — MIDAZOLAM HCL 2 MG/2ML IJ SOLN
INTRAMUSCULAR | Status: AC
  Filled 2013-11-08: qty 2

## 2013-11-08 MED ORDER — DEXAMETHASONE SODIUM PHOSPHATE 10 MG/ML IJ SOLN
INTRAMUSCULAR | Status: AC
  Filled 2013-11-08: qty 1

## 2013-11-08 MED ORDER — ONDANSETRON HCL 4 MG/2ML IJ SOLN
4.0000 mg | Freq: Four times a day (QID) | INTRAMUSCULAR | Status: DC | PRN
  Administered 2013-11-09 – 2013-11-10 (×2): 4 mg via INTRAVENOUS
  Filled 2013-11-08 (×2): qty 2

## 2013-11-08 MED ORDER — LACTATED RINGERS IV SOLN
INTRAVENOUS | Status: DC
  Administered 2013-11-08: 14:00:00 via INTRAVENOUS
  Administered 2013-11-08: 1000 mL via INTRAVENOUS
  Administered 2013-11-08: 13:00:00 via INTRAVENOUS

## 2013-11-08 MED ORDER — NEOSTIGMINE METHYLSULFATE 10 MG/10ML IV SOLN
INTRAVENOUS | Status: DC | PRN
  Administered 2013-11-08: 5 mg via INTRAVENOUS

## 2013-11-08 MED ORDER — ZOLPIDEM TARTRATE 5 MG PO TABS
5.0000 mg | ORAL_TABLET | Freq: Every day | ORAL | Status: DC
  Administered 2013-11-08 – 2013-11-12 (×5): 5 mg via ORAL
  Filled 2013-11-08 (×5): qty 1

## 2013-11-08 MED ORDER — HYDROMORPHONE HCL PF 1 MG/ML IJ SOLN
0.2500 mg | INTRAMUSCULAR | Status: DC | PRN
  Administered 2013-11-08 (×2): 0.5 mg via INTRAVENOUS

## 2013-11-08 MED ORDER — MIDAZOLAM HCL 5 MG/5ML IJ SOLN
INTRAMUSCULAR | Status: DC | PRN
  Administered 2013-11-08 (×2): 1 mg via INTRAVENOUS

## 2013-11-08 MED ORDER — DEXAMETHASONE SODIUM PHOSPHATE 4 MG/ML IJ SOLN
INTRAMUSCULAR | Status: DC | PRN
  Administered 2013-11-08: 10 mg via INTRAVENOUS

## 2013-11-08 MED ORDER — PROPOFOL 10 MG/ML IV BOLUS
INTRAVENOUS | Status: DC | PRN
  Administered 2013-11-08: 180 mg via INTRAVENOUS

## 2013-11-08 MED ORDER — KETAMINE HCL 10 MG/ML IJ SOLN
INTRAMUSCULAR | Status: DC | PRN
  Administered 2013-11-08: 50 mg via INTRAVENOUS

## 2013-11-08 SURGICAL SUPPLY — 81 items
APPLIER CLIP 5 13 M/L LIGAMAX5 (MISCELLANEOUS)
APR CLP MED LRG 5 ANG JAW (MISCELLANEOUS)
BLADE EXTENDED COATED 6.5IN (ELECTRODE) ×4 IMPLANT
BLADE SURG SZ10 CARB STEEL (BLADE) ×4 IMPLANT
CABLE HIGH FREQUENCY MONO STRZ (ELECTRODE) IMPLANT
CELLS DAT CNTRL 66122 CELL SVR (MISCELLANEOUS) ×2 IMPLANT
CHLORAPREP W/TINT 26ML (MISCELLANEOUS) ×4 IMPLANT
CLIP APPLIE 5 13 M/L LIGAMAX5 (MISCELLANEOUS) IMPLANT
COVER MAYO STAND STRL (DRAPES) ×4 IMPLANT
DECANTER SPIKE VIAL GLASS SM (MISCELLANEOUS) ×4 IMPLANT
DRAIN CHANNEL 19F RND (DRAIN) IMPLANT
DRAPE LAPAROSCOPIC ABDOMINAL (DRAPES) ×6 IMPLANT
DRAPE LG THREE QUARTER DISP (DRAPES) ×6 IMPLANT
DRAPE UTILITY XL STRL (DRAPES) ×10 IMPLANT
DRAPE WARM FLUID 44X44 (DRAPE) ×4 IMPLANT
DRSG OPSITE POSTOP 4X10 (GAUZE/BANDAGES/DRESSINGS) ×2 IMPLANT
DRSG OPSITE POSTOP 4X6 (GAUZE/BANDAGES/DRESSINGS) IMPLANT
DRSG OPSITE POSTOP 4X8 (GAUZE/BANDAGES/DRESSINGS) ×2 IMPLANT
ELECT PENCIL ROCKER SW 15FT (MISCELLANEOUS) ×8 IMPLANT
ELECT REM PT RETURN 9FT ADLT (ELECTROSURGICAL) ×4
ELECTRODE REM PT RTRN 9FT ADLT (ELECTROSURGICAL) ×2 IMPLANT
EVACUATOR SILICONE 100CC (DRAIN) IMPLANT
GAUZE SPONGE 4X4 12PLY STRL (GAUZE/BANDAGES/DRESSINGS) ×2 IMPLANT
GLOVE BIO SURGEON STRL SZ 6.5 (GLOVE) ×7 IMPLANT
GLOVE BIO SURGEONS STRL SZ 6.5 (GLOVE) ×3
GLOVE BIOGEL PI IND STRL 7.0 (GLOVE) ×4 IMPLANT
GLOVE BIOGEL PI INDICATOR 7.0 (GLOVE) ×10
GOWN SPEC L3 XXLG W/TWL (GOWN DISPOSABLE) ×8 IMPLANT
GOWN STRL REUS W/TWL XL LVL3 (GOWN DISPOSABLE) ×18 IMPLANT
KIT BASIN OR (CUSTOM PROCEDURE TRAY) ×4 IMPLANT
LEGGING LITHOTOMY PAIR STRL (DRAPES) ×2 IMPLANT
LIGASURE IMPACT 36 18CM CVD LR (INSTRUMENTS) IMPLANT
PACK GENERAL/GYN (CUSTOM PROCEDURE TRAY) ×4 IMPLANT
RELOAD PROXIMATE 75MM BLUE (ENDOMECHANICALS) ×8 IMPLANT
RELOAD STAPLE 75 3.8 BLU REG (ENDOMECHANICALS) IMPLANT
RETRACTOR WND ALEXIS 18 MED (MISCELLANEOUS) IMPLANT
RTRCTR WOUND ALEXIS 18CM MED (MISCELLANEOUS) ×4
SCISSORS LAP 5X35 DISP (ENDOMECHANICALS) ×4 IMPLANT
SEALER TISSUE G2 CVD JAW 35 (ENDOMECHANICALS) IMPLANT
SEALER TISSUE G2 CVD JAW 45CM (ENDOMECHANICALS)
SEALER TISSUE G2 STRG ARTC 35C (ENDOMECHANICALS) ×2 IMPLANT
SET IRRIG TUBING LAPAROSCOPIC (IRRIGATION / IRRIGATOR) ×4 IMPLANT
SLEEVE XCEL OPT CAN 5 100 (ENDOMECHANICALS) ×4 IMPLANT
SOLUTION ANTI FOG 6CC (MISCELLANEOUS) ×4 IMPLANT
SPONGE LAP 18X18 X RAY DECT (DISPOSABLE) IMPLANT
STAPLER CUT CVD 40MM BLUE (STAPLE) ×2 IMPLANT
STAPLER PROXIMATE 75MM BLUE (STAPLE) ×2 IMPLANT
STAPLER VISISTAT 35W (STAPLE) ×4 IMPLANT
SUCTION POOLE TIP (SUCTIONS) ×4 IMPLANT
SUT CHROMIC 3 0 SH 27 (SUTURE) ×2 IMPLANT
SUT ETHILON 2 0 PS N (SUTURE) IMPLANT
SUT NOVA 1 T20/GS 25DT (SUTURE) IMPLANT
SUT NOVA NAB DX-16 0-1 5-0 T12 (SUTURE) IMPLANT
SUT PDS AB 1 CTX 36 (SUTURE) IMPLANT
SUT PDS AB 1 TP1 96 (SUTURE) IMPLANT
SUT PROLENE 2 0 KS (SUTURE) IMPLANT
SUT SILK 2 0 (SUTURE) ×4
SUT SILK 2 0 SH CR/8 (SUTURE) ×4 IMPLANT
SUT SILK 2-0 18XBRD TIE 12 (SUTURE) ×2 IMPLANT
SUT SILK 3 0 (SUTURE) ×4
SUT SILK 3 0 SH CR/8 (SUTURE) ×4 IMPLANT
SUT SILK 3-0 18XBRD TIE 12 (SUTURE) ×2 IMPLANT
SUT VIC AB 2-0 SH 18 (SUTURE) ×2 IMPLANT
SUT VIC AB 4-0 PS2 18 (SUTURE) ×2 IMPLANT
SUT VIC AB 4-0 PS2 27 (SUTURE) IMPLANT
SUT VICRYL 2 0 18  UND BR (SUTURE)
SUT VICRYL 2 0 18 UND BR (SUTURE) IMPLANT
SYR BULB IRRIGATION 50ML (SYRINGE) ×2 IMPLANT
SYS LAPSCP GELPORT 120MM (MISCELLANEOUS)
SYSTEM LAPSCP GELPORT 120MM (MISCELLANEOUS) IMPLANT
TOWEL OR 17X26 10 PK STRL BLUE (TOWEL DISPOSABLE) ×12 IMPLANT
TOWEL OR NON WOVEN STRL DISP B (DISPOSABLE) ×8 IMPLANT
TRAY FOLEY CATH 14FRSI W/METER (CATHETERS) ×4 IMPLANT
TRAY LAP CHOLE (CUSTOM PROCEDURE TRAY) ×4 IMPLANT
TROCAR BLADELESS OPT 5 100 (ENDOMECHANICALS) ×4 IMPLANT
TROCAR XCEL 12X100 BLDLESS (ENDOMECHANICALS) IMPLANT
TROCAR XCEL BLUNT TIP 100MML (ENDOMECHANICALS) ×4 IMPLANT
TROCAR XCEL NON-BLD 11X100MML (ENDOMECHANICALS) IMPLANT
TUBING FILTER THERMOFLATOR (ELECTROSURGICAL) ×4 IMPLANT
TUBING INSUFFLATION 10FT LAP (TUBING) ×4 IMPLANT
YANKAUER SUCT BULB TIP 10FT TU (MISCELLANEOUS) ×4 IMPLANT

## 2013-11-08 NOTE — Transfer of Care (Signed)
Immediate Anesthesia Transfer of Care Note  Patient: Andrea Morgan  Procedure(s) Performed: Procedure(s): laparoscopic partial right colectomy (N/A) ANAL EXAM UNDER ANESTHESIA WITH BIOPSY (N/A)  Patient Location: PACU  Anesthesia Type:General  Level of Consciousness: Patient easily awoken, sedated, comfortable, cooperative, following commands, responds to stimulation.   Airway & Oxygen Therapy: Patient spontaneously breathing, ventilating well, oxygen via simple oxygen mask.  Post-op Assessment: Report given to PACU RN, vital signs reviewed and stable, moving all extremities.   Post vital signs: Reviewed and stable.  Complications: No apparent anesthesia complications

## 2013-11-08 NOTE — Op Note (Addendum)
11/08/2013  3:04 PM  PATIENT:  Andrea Morgan  63 y.o. female  Patient Care Team: [name], MD as PCP - General (Internal Medicine) Collene Gobble, MD as Consulting Physician (Pulmonary Disease)  PRE-OPERATIVE DIAGNOSIS:  colon polyp, anal polyp  POST-OPERATIVE DIAGNOSIS:  colon polyp, anal lesion  PROCEDURE:   laparoscopic right colectomy ANAL EXAM UNDER ANESTHESIA WITH BIOPSY  SURGEON:  Surgeon(s): [name], MD  ASSISTANTRedmond Pulling   ANESTHESIA:   local and general  EBL:  Total I/O In: 2000 [I.V.:2000] Out: 140 [Urine:90; Blood:50]  SPECIMEN:  Source of Specimen:  anal canal (L posterior), R colon  DISPOSITION OF SPECIMEN:  PATHOLOGY  COUNTS:  YES  PLAN OF CARE: Admit to inpatient   PATIENT DISPOSITION:  PACU - hemodynamically stable.   INDICATIONS: This is a 63 y.o. female who presented to my office with an endoscopically unresectable colon polyp at the hepatic flexure and anal polyps seen on colonoscopy. The risk and benefits and alternative treatments were explained to the patient prior to the OR and the patient has elected to proceed with laparoscopic R colectomy and anal lesion evaluation.  Consent was signed and placed on chart prior to the OR.   OR FINDINGS: anal lesion on hemorrhoid in L posterior region.  Normal appearing R colon with tattoo  DESCRIPTION:  The patient was identified & brought into the operating room. The patient was positioned supine with left arm tucked. SCDs were active during the entire case. The patient underwent general anesthesia without any difficulty. A foley catheter was inserted under sterile conditions. The patient was placed in lithotomy position and her perineal region was prepped and draped in sterile fashion.  A Surgical Timeout confirmed our plan.   I used a Medical illustrator to evaluate the anal canal.  There was a polypoid mass in the region left posterior internal hemorrhoid.  This was removed sharply and sent  to pathology.  The biopsy site was closed with a figure of eight 3-0 chromic suture.  Hemostasis was good.  We then removed the drapes and placed the patient in the supine position.  The abdomen was then prepped and draped in a sterile fashion. A Surgical Timeout confirmed our plan.  I made a vertical incision through the inferior umbilical fold. I dissected down through the subcutaneous tissues using blunt dissection and elevated the fascia with 2 Kocher clamps.  The fascia was incised with a scalpel.  Blunt dissection was used to obtain peritoneal entry. I placed a stay suture and then the [facility] port. We induced carbon dioxide insufflation. Camera inspection revealed no injury. I placed additional ports under direct laparoscopic visualization.  I evaluated the entire abdomen laparoscopically.  The liver appeared normal, the large and small bowel were normal as well.  There were no signs of metastatic disease.  I began by identifying the ileocolic artery and vein within the mesentery. Dissection was bluntly carried around these structures. The duodenum was identified and free from the structures. I then separated the structures bluntly and used the Enseal device to transect these separately.  I developed the retroperitoneal plane bluntly.  I then freed the appendix off its attachments to the pelvic wall. I mobilized the terminal ileum.  I took care to avoid injuring any retroperitoneal structures.  After this I began to mobilize laterally down the white line of Toldt and then took down the hepatic flexure using the Enseal device. I mobilized the omentum off of the right transverse colon. The  entire colon was then flipped medially and mobilized off of the retroperitoneal structures until I could visualize the lateral edge of the duodenum underneath.  I gently freed the duodenal attachments.  At that point, I made an enlargement of my umbilical incision using a scalpel. The fascia was then incised using  electrocautery. An alexis wound protector was placed. The terminal ileum and right colon were then removed from the wound. The terminal ileum was transected using a GIA blue load stapler. The remaining mesentery was divided using the Enseal device. I identified a portion of the transverse colon just distal to the hepatic flexure. This was transected using another blue load GIA stapler.  An anastomosis was created between the terminal ileum and the transverse colon. This was done using a GIA blue load stapler.  The common enterotomy channel was closed using a TA 60 blue load stapler. Hemostasis was good at the staple line. Several 3-0 silk sutures were used to imbricate the edge of the anastomosis. An anti-tension suture was placed in the crotch of the anastomosis.  The mesenteric defect was closed with 2-0 silk sutures.  This was then placed back into the abdomen. The abdomen was then irrigated with normal saline. The omentum was then brought down over the anastomosis. The Alexis wound protector was removed, and we switched to clean instruments, gowns and drapes.  The fascia was then closed using #1 Novafil interrupted sutures. The abdomen was then re-insufflated and evaluated for hemostasis.  There was some old clot in the right pericolic gutter but no active bleeding was noticed. Hemostasis was good. The abdomen was then desufflated and the subcutaneous tissue of the umbilical incision was closed using interrupted 2-0 Vicryl sutures. The skin was then closed using 4-0 Vicryl sutures. Dermabond was placed on the port sites and a sterile dressing was placed over the abdominal incision. All counts were correct per operating room staff. The patient was then awakened from anesthesia and sent to the post anesthesia care unit in stable condition.

## 2013-11-08 NOTE — Anesthesia Preprocedure Evaluation (Addendum)
Anesthesia Evaluation  Patient identified by MRN, date of birth, ID band Patient awake    Reviewed: Allergy & Precautions, H&P , NPO status , Patient's Chart, lab work & pertinent test results  History of Anesthesia Complications (+) PONV and history of anesthetic complications  Airway Mallampati: II TM Distance: >3 FB Neck ROM: Full  Mouth opening: Limited Mouth Opening  Dental no notable dental hx.    Pulmonary shortness of breath, COPD COPD inhaler, former smoker,  COPD : doing OK recently. Last used inhaler about 3 weeks ago. breath sounds clear to auscultation  Pulmonary exam normal       Cardiovascular negative cardio ROS  Rhythm:Regular Rate:Normal     Neuro/Psych  Headaches, PSYCHIATRIC DISORDERS Anxiety Depression    GI/Hepatic Neg liver ROS, hiatal hernia, GERD-  Medicated,  Endo/Other  negative endocrine ROS  Renal/GU negative Renal ROS  negative genitourinary   Musculoskeletal negative musculoskeletal ROS (+)   Abdominal   Peds negative pediatric ROS (+)  Hematology  (+) Blood dyscrasia, anemia ,   Anesthesia Other Findings   Reproductive/Obstetrics negative OB ROS                         Anesthesia Physical Anesthesia Plan  ASA: III  Anesthesia Plan: General   Post-op Pain Management:    Induction: Intravenous  Airway Management Planned: Oral ETT  Additional Equipment:   Intra-op Plan:   Post-operative Plan: Extubation in OR  Informed Consent: I have reviewed the patients History and Physical, chart, labs and discussed the procedure including the risks, benefits and alternatives for the proposed anesthesia with the patient or authorized representative who has indicated his/her understanding and acceptance.   Dental advisory given  Plan Discussed with: CRNA  Anesthesia Plan Comments:         Anesthesia Quick Evaluation

## 2013-11-08 NOTE — Interval H&P Note (Signed)
History and Physical Interval Note:  11/08/2013 12:04 PM  Andrea Morgan  has presented today for surgery, with the diagnosis of colon polyp  The various methods of treatment have been discussed with the patient and family. After consideration of risks, benefits and other options for treatment, the patient has consented to  Procedure(s): LAPAROSCOPIC PARTIAL COLECTOMY, ANAL EXAM UNDER ANESTHESIA, POSSIBLE BIOPSY (N/A) as a surgical intervention .  The patient's history has been reviewed, patient examined, no change in status, stable for surgery.  I have reviewed the patient's chart and labs.  Questions were answered to the patient's satisfaction.     [name], MD  [facility]

## 2013-11-08 NOTE — Progress Notes (Signed)
Patient cannot remove rings on right hand ring finger. She is instructed in the risks of rings on finger during surgery. She acknowledges complications.

## 2013-11-08 NOTE — H&P (View-Only) (Signed)
Chief Complaint  Patient presents with  . Colon Polyps    HISTORY: Andrea Morgan is a 63 y.o. female who presents to the office with a colon polyp that was found on screening colonoscopy.  She was also found to have some anal polyps that were not biopsied, as well as other colon polyps that were removed.  She denies any rectal bleeding.  She has occasional lower abd cramps.  Denies weight loss.   She has a FH of colon cancer.  Past Medical History  Diagnosis Date  . Insomnia   . Chronic headaches   . Allergic rhinitis   . Depression   . Depression   . PONV (postoperative nausea and vomiting)   . Shortness of breath     with exercsie  . Cancer   . Anxiety   . GERD (gastroesophageal reflux disease)   . H/O hiatal hernia   . History of kidney stones   . Anemia   . COPD (chronic obstructive pulmonary disease)   . Emphysema of lung   . Hyperlipidemia       Past Surgical History  Procedure Laterality Date  . Appendectomy  1978  . Tonsillectomy  1971  . Cervical fusion  1997  . Video bronchoscopy  05/11/2011    Procedure: VIDEO BRONCHOSCOPY WITH FLUORO;  Surgeon: Collene Gobble, MD;  Location: [facility];  Service: Cardiopulmonary;  Laterality: Bilateral;  . Cervical fusion    . Esophagogastroduodenoscopy  05/21/2011    Procedure: ESOPHAGOGASTRODUODENOSCOPY (EGD);  Surgeon: Beryle Beams, MD;  Location: [facility];  Service: Endoscopy;  Laterality: N/A;  . Video bronchoscopy  06/02/2011    Procedure: VIDEO BRONCHOSCOPY;  Surgeon: Grace Isaac, MD;  Location: [facility];  Service: Thoracic;  Laterality: N/A;  . Mass excision  06/02/2011    Procedure: EXCISION MASS;  Surgeon: Grace Isaac, MD;  Location: [facility];  Service: Thoracic;;      Current Outpatient Prescriptions  Medication Sig Dispense Refill  . albuterol (PROVENTIL) (2.5 MG/3ML) 0.083% nebulizer solution Take 2.5 mg by nebulization as needed for wheezing or shortness of breath.      Marland Kitchen atorvastatin (LIPITOR)  20 MG tablet Take 20 mg by mouth daily.      Marland Kitchen zolpidem (AMBIEN) 10 MG tablet Take 10 mg by mouth at bedtime as needed for sleep.       No current facility-administered medications for this visit.      Allergies  Allergen Reactions  . Sulfa Antibiotics Hives      Family History  Problem Relation Age of Onset  . Asthma Mother   . Heart disease Mother   . Asthma Maternal Grandmother   . Lung cancer Father   . Cancer Father   . Ovarian cancer Maternal Aunt   . Breast cancer Maternal Aunt     x2  . Prostate cancer Maternal Uncle   . Kidney cancer Sister   . Anesthesia problems Neg Hx   . Colon cancer Brother       History   Social History  . Marital Status: Divorced    Spouse Name: N/A    Number of Children: N/A  . Years of Education: N/A   Occupational History  . caregiver [employer]   Social History Main Topics  . Smoking status: Former Smoker -- 1.00 packs/day for 45 years    Types: Cigarettes    Quit date: 04/14/2011  . Smokeless tobacco: Never Used  . Alcohol Use: No  .  Drug Use: Yes    Special: Marijuana  . Sexual Activity: None   Other Topics Concern  . None   Social History Narrative  . None       REVIEW OF SYSTEMS - PERTINENT POSITIVES ONLY: Review of Systems - General ROS: negative for - chills or fever Respiratory ROS: no cough, shortness of breath, or wheezing Cardiovascular ROS: no chest pain or dyspnea on exertion Gastrointestinal ROS: see above Genito-Urinary ROS: no dysuria, trouble voiding, or hematuria  EXAM: Filed Vitals:   10/22/13 1053  BP: 122/70  Pulse: 74  Temp: 98.1 F (36.7 C)  Resp: 16    Gen:  No acute distress.  Well nourished and well groomed.   Neurological: Alert and oriented to person, place, and time. Coordination normal.  Head: Normocephalic and atraumatic.  Eyes: Conjunctivae are normal. Pupils are equal, round, and reactive to light. No scleral icterus.  Neck: Normal range of motion. Neck supple. No  tracheal deviation or thyromegaly present.  No cervical lymphadenopathy. Cardiovascular: Normal rate, regular rhythm, normal heart sounds and intact distal pulses. Respiratory: Effort normal.  No respiratory distress. No chest wall tenderness. Breath sounds normal.  No wheezes, rales or rhonchi.  GI: Soft. Bowel sounds are normal. The abdomen is soft and nontender.  There is no rebound and no guarding.  Musculoskeletal: Normal range of motion. Extremities are nontender.  Skin: Skin is warm and dry. No rash noted. No diaphoresis. No erythema. No pallor. No clubbing, cyanosis, or edema.   Psychiatric: Normal mood and affect. Behavior is normal. Judgment and thought content normal.   Pathology 2. Surgical [P], hepatic flexure, biopsy - FRAGMENTS OF TUBULOVILLOUS ADENOMA. - NO HIGH GRADE DYSPLASIA OR MALIGNANCY, SEE COMMENT.  LABORATORY RESULTS: Available labs are reviewed  No results found for this basename: CEA     ASSESSMENT AND PLAN: Andrea Morgan is a 63 y.o. F with colon polyps that presents to the office with an endoscopically unresectable hepatic flexure polyp.  Biopsy reveal TVA with not HGD.  The area was tattooed.  We discussed her options which include following the area endoscopically, endoscopic mucosal resection and surgical resection.  She has elected surgical resection because she does not want to undergo multiple colonoscopies over the next year.  She is reluctant to schedule surgery though due to finances.  She would like to wait until next month to do this.  I think this is reasonable.  She was given bowel prep instructions and prescriptions for preoperative antibiotics.   The surgery and anatomy were described to the patient in detail, as well as the risks of surgery and the possible complications.  These include: Bleeding, deep abdominal infections and possible wound complications such as hernia and infection, damage to adjacent structures, leak of surgical connections,  which can lead to other surgeries and possibly an ostomy, possible need for other procedures, such as abscess drains in radiology, possible prolonged hospital stay, possible diarrhea from removal of part of the colon, possible constipation from narcotics, prolonged fatigue/weakness or appetite loss, possible early recurrence of of disease, possible complications of their medical problems such as heart disease or arrhythmias or lung problems, death (less than 1%). I believe the patient understands and wishes to proceed with the surgery.     [name], MD Colon and Rectal Surgery / [facility]      Visit Diagnoses: No diagnosis found.  Primary Care Physician: [name], MD

## 2013-11-09 LAB — CBC
HCT: 34.2 % — ABNORMAL LOW (ref 36.0–46.0)
Hemoglobin: 11.3 g/dL — ABNORMAL LOW (ref 12.0–15.0)
MCH: 27.8 pg (ref 26.0–34.0)
MCHC: 33 g/dL (ref 30.0–36.0)
MCV: 84 fL (ref 78.0–100.0)
Platelets: 312 10*3/uL (ref 150–400)
RBC: 4.07 MIL/uL (ref 3.87–5.11)
RDW: 15.6 % — AB (ref 11.5–15.5)
WBC: 19.7 10*3/uL — ABNORMAL HIGH (ref 4.0–10.5)

## 2013-11-09 LAB — BASIC METABOLIC PANEL
Anion gap: 12 (ref 5–15)
BUN: 8 mg/dL (ref 6–23)
CALCIUM: 9 mg/dL (ref 8.4–10.5)
CO2: 25 meq/L (ref 19–32)
CREATININE: 0.66 mg/dL (ref 0.50–1.10)
Chloride: 100 mEq/L (ref 96–112)
GFR calc Af Amer: >90 mL/min (ref >90)
GFR calc non Af Amer: >90 mL/min (ref >90)
Glucose, Bld: 141 mg/dL — ABNORMAL HIGH (ref 70–99)
Potassium: 4.4 mEq/L (ref 3.7–5.3)
Sodium: 137 mEq/L (ref 137–147)

## 2013-11-09 MED ORDER — LORAZEPAM 0.5 MG PO TABS: 0.5000 mg | ORAL_TABLET | Freq: Four times a day (QID) | ORAL | Status: DC | PRN

## 2013-11-09 MED ORDER — LORAZEPAM 2 MG/ML IJ SOLN: 0.5000 mg | Freq: Four times a day (QID) | INTRAMUSCULAR | Status: DC | PRN

## 2013-11-09 MED ORDER — KCL IN DEXTROSE-NACL 20-5-0.45 MEQ/L-%-% IV SOLN
INTRAVENOUS | Status: DC
  Administered 2013-11-09 – 2013-11-10 (×3): via INTRAVENOUS
  Filled 2013-11-09 (×4): qty 1000

## 2013-11-09 MED ORDER — CALCIUM CARBONATE ANTACID 500 MG PO CHEW
1.0000 | CHEWABLE_TABLET | Freq: Two times a day (BID) | ORAL | Status: DC
  Administered 2013-11-09 – 2013-11-12 (×8): 200 mg via ORAL
  Filled 2013-11-09 (×10): qty 1

## 2013-11-09 NOTE — Progress Notes (Signed)
[facility]   [facility] is providing the following services: [employer]  If patient discharges after hours, please call [phone].   Linward Headland 11/09/2013, 1:53 PM

## 2013-11-09 NOTE — Progress Notes (Signed)
1 Day Post-Op Lap R colectomy Subjective: Didn't get much sleep last night.  Had a "panic attack".  Feels better this am.  No nausea.  PCA working well.   Objective: Vital signs in last 24 hours: Temp:  [97.5 F (36.4 C)-98.1 F (36.7 C)] 97.8 F (36.6 C) (07/24 0543) Pulse Rate:  [58-100] 98 (07/24 0543) Resp:  [10-20] 20 (07/24 0543) BP: (112-150)/(44-69) 150/69 mmHg (07/24 0543) SpO2:  [94 %-100 %] 97 % (07/24 0543) Weight:  [171 lb (77.565 kg)] 171 lb (77.565 kg) (07/23 1019)   Intake/Output from previous day: 07/23 0701 - 07/24 0700 In: 3830 [P.O.:30; I.V.:3800] Out: 1015 [Urine:965; Blood:50] Intake/Output this shift:     General appearance: alert and cooperative GI: normal findings: soft, appropriately tender  Incision: dressing clean, dry, intact  Lab Results:   Recent Labs  11/07/13 0840 11/09/13 0510  WBC 7.7 19.7*  HGB 12.1 11.3*  HCT 36.2 34.2*  PLT 303 312   BMET  Recent Labs  11/07/13 0840 11/09/13 0510  NA 141 137  K 4.2 4.4  CL 105 100  CO2 27 25  GLUCOSE 96 141*  BUN 10 8  CREATININE 0.74 0.66  CALCIUM 9.1 9.0   PT/INR No results found for this basename: LABPROT, INR,  in the last 72 hours ABG No results found for this basename: PHART, PCO2, PO2, HCO3,  in the last 72 hours  MEDS, Scheduled . acetaminophen  1,000 mg Oral 4 times per day  . alvimopan  12 mg Oral BID  . antiseptic oral rinse  15 mL Mouth Rinse q12n4p  . atorvastatin  20 mg Oral QHS  . chlorhexidine  15 mL Mouth Rinse BID  . enoxaparin (LOVENOX) injection  40 mg Subcutaneous Q24H  . morphine   Intravenous 6 times per day  . omeprazole  20 mg Oral QHS  . zolpidem  5 mg Oral QHS    Studies/Results: No results found.  Assessment: s/p Procedure(s): laparoscopic partial right colectomy ANAL EXAM UNDER ANESTHESIA WITH BIOPSY Patient Active Problem List   Diagnosis Date Noted  . Colon polyp 11/08/2013  . COPD (chronic obstructive pulmonary disease) 04/26/2013   . 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 Chest mass 05/04/2011    Expected post op course  Plan: Advance diet to clears today as tolerate Ambulate TID Decrease MIV to 81ml/h Ativan prn for anxiety   LOS: 1 day     .Andrea Morgan, [facility], [place]   11/09/2013 7:43 AM

## 2013-11-09 NOTE — Evaluation (Signed)
Physical Therapy Evaluation Patient Details Name: Andrea Morgan MRN: [id number] DOB: [birth year] Today's Date: 11/09/2013   History of Present Illness  s/p laparoscopic R colectomy  Clinical Impression  Pt s/p laparoscopic R colectomy presents with functional mobility limitations 2* post op nausea and pain.  Pt is currently mobilizing with min assist and use of RW and is highly motivated to progress to IND by d/c from hospital.    Follow Up Recommendations No PT follow up    Equipment Recommendations  3in1 (PT)    Recommendations for Other Services OT consult     Precautions / Restrictions Precautions Precautions: Fall;Other (comment) Precaution Comments: Post abdominal surgery Restrictions Weight Bearing Restrictions: No      Mobility  Bed Mobility Overal bed mobility: Needs Assistance Bed Mobility: Supine to Sit     Supine to sit: Min assist     General bed mobility comments: cues for sequencing and correct log roll technique  Transfers Overall transfer level: Needs assistance Equipment used: Rolling walker (2 wheeled) Transfers: Sit to/from Stand Sit to Stand: Min assist         General transfer comment: cues  for transition position and use of UEs to self assist  Ambulation/Gait Ambulation/Gait assistance: Min assist Ambulation Distance (Feet): 56 Feet Assistive device: Rolling walker (2 wheeled) Gait Pattern/deviations: Step-through pattern;Decreased step length - right;Decreased step length - left;Shuffle;Trunk flexed Gait velocity: decr   General Gait Details: cues for posture and position from RW; multiple short rests to complete task 2* fatigue and nausea  Stairs            Wheelchair Mobility    Modified Rankin (Stroke Patients Only)       Balance                                             Pertinent Vitals/Pain 5/10; PCA used    Home Living Family/patient expects to be discharged to:: Private  residence Living Arrangements: Alone Available Help at Discharge: Family Type of Home: Apartment Home Access: Level entry     Home Layout: One level Home Equipment: Environmental consultant - 2 wheels      Prior Function Level of Independence: Independent         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     Hand Dominance        Extremity/Trunk Assessment   Upper Extremity Assessment: Overall WFL for tasks assessed           Lower Extremity Assessment: Overall WFL for tasks assessed      Cervical / Trunk Assessment: Normal  Communication   Communication: No difficulties  Cognition Arousal/Alertness: Awake/alert Behavior During Therapy: WFL for tasks assessed/performed Overall Cognitive Status: Within Functional Limits for tasks assessed                      General Comments      Exercises General Exercises - Lower Extremity Ankle Circles/Pumps: AROM;15 reps;Supine;Both      Assessment/Plan    PT Assessment Patient needs continued PT services  PT Diagnosis Difficulty walking   PT Problem List Decreased activity tolerance;Decreased mobility;Decreased knowledge of use of DME;Pain  PT Treatment Interventions DME instruction;Gait training;Functional mobility training;Therapeutic activities;Therapeutic exercise;Patient/family education   PT Goals (Current goals can be found in the Care Plan section) Acute Rehab PT Goals Patient Stated Goal: Home. PT  Goal Formulation: With patient Time For Goal Achievement: 11/16/13 Potential to Achieve Goals: Good    Frequency Min 3X/week   Barriers to discharge        Co-evaluation               End of Session   Activity Tolerance: Patient limited by fatigue;Other (comment) (nausea) Patient left: in chair;with call bell/phone within reach Nurse Communication: Mobility status         Time: 4784-1282 PT Time Calculation (min): 30 min   Charges:   PT Evaluation $Initial PT Evaluation Tier I: 1  Procedure PT Treatments $Gait Training: 8-22 mins $Therapeutic Activity: 8-22 mins   PT G Codes:          Shayleigh Bouldin 11/09/2013, 12:48 PM

## 2013-11-09 NOTE — Anesthesia Postprocedure Evaluation (Signed)
  Anesthesia Post-op Note  Patient: Andrea Morgan  Procedure(s) Performed: Procedure(s) (LRB): laparoscopic partial right colectomy (N/A) ANAL EXAM UNDER ANESTHESIA WITH BIOPSY (N/A)  Patient Location: PACU  Anesthesia Type: General  Level of Consciousness: awake and alert   Airway and Oxygen Therapy: Patient Spontanous Breathing  Post-op Pain: mild  Post-op Assessment: Post-op Vital signs reviewed, Patient's Cardiovascular Status Stable, Respiratory Function Stable, Patent Airway and No signs of Nausea or vomiting  Last Vitals:  Filed Vitals:   11/09/13 1144  BP:   Pulse:   Temp:   Resp: 12    Post-op Vital Signs: stable   Complications: No apparent anesthesia complications

## 2013-11-10 LAB — BASIC METABOLIC PANEL
Anion gap: 8 (ref 5–15)
BUN: 17 mg/dL (ref 6–23)
CO2: 29 mEq/L (ref 19–32)
Calcium: 9.1 mg/dL (ref 8.4–10.5)
Chloride: 98 mEq/L (ref 96–112)
Creatinine, Ser: 0.63 mg/dL (ref 0.50–1.10)
GLUCOSE: 108 mg/dL — AB (ref 70–99)
Potassium: 4.2 mEq/L (ref 3.7–5.3)
Sodium: 135 mEq/L — ABNORMAL LOW (ref 137–147)

## 2013-11-10 LAB — CBC
HCT: 31 % — ABNORMAL LOW (ref 36.0–46.0)
HEMOGLOBIN: 10.2 g/dL — AB (ref 12.0–15.0)
MCH: 28.2 pg (ref 26.0–34.0)
MCHC: 32.9 g/dL (ref 30.0–36.0)
MCV: 85.6 fL (ref 78.0–100.0)
PLATELETS: 301 10*3/uL (ref 150–400)
RBC: 3.62 MIL/uL — ABNORMAL LOW (ref 3.87–5.11)
RDW: 16.1 % — ABNORMAL HIGH (ref 11.5–15.5)
WBC: 14.9 10*3/uL — ABNORMAL HIGH (ref 4.0–10.5)

## 2013-11-10 MED ORDER — PANTOPRAZOLE SODIUM 40 MG IV SOLR
40.0000 mg | INTRAVENOUS | Status: DC
  Administered 2013-11-10 – 2013-11-12 (×3): 40 mg via INTRAVENOUS
  Filled 2013-11-10 (×5): qty 40

## 2013-11-10 MED ORDER — PROMETHAZINE HCL 25 MG/ML IJ SOLN
12.5000 mg | INTRAMUSCULAR | Status: DC | PRN
  Administered 2013-11-10: 12.5 mg via INTRAVENOUS
  Filled 2013-11-10: qty 1

## 2013-11-10 NOTE — Progress Notes (Signed)
Patient ID: Andrea Morgan, female   DOB: Aug 23, 1950, 63 y.o.   MRN: [id number]  General Surgery - [facility] - Progress Note  POD# 2  Subjective: Patient doing well.  Ambulating in halls.  No nausea or emesis today.  No flatus or BM yet.  Objective: Vital signs in last 24 hours: Temp:  [97.6 F (36.4 C)-99 F (37.2 C)] 97.6 F (36.4 C) (07/25 0513) Pulse Rate:  [72-86] 86 (07/25 0513) Resp:  [12-16] 12 (07/25 0922) BP: (134-155)/(56-63) 134/56 mmHg (07/25 0513) SpO2:  [94 %-99 %] 98 % (07/25 0922) Last BM Date: 11/08/13  Intake/Output from previous day: 07/24 0701 - 07/25 0700 In: 2417.4 [P.O.:600; I.V.:1817.4] Out: 2025 [Urine:2025]  Exam: HEENT - clear, not icteric Neck - soft Chest - clear bilaterally Cor - RRR, no murmur Abd - soft, mild distension; BS present; dressings intact Ext - no significant edema Neuro - grossly intact, no focal deficits  Lab Results:   Recent Labs  11/09/13 0510 11/10/13 0536  WBC 19.7* 14.9*  HGB 11.3* 10.2*  HCT 34.2* 31.0*  PLT 312 301     Recent Labs  11/09/13 0510 11/10/13 0536  NA 137 135*  K 4.4 4.2  CL 100 98  CO2 25 29  GLUCOSE 141* 108*  BUN 8 17  CREATININE 0.66 0.63  CALCIUM 9.0 9.1    Studies/Results: No results found.  Assessment / Plan: 1.  Status post lap right colectomy  Advance to full liquid diet  Encouraged OOB, ambulation  Continue PCA for pain control  Earnstine Regal, MD, [facility] Office: [phone]  11/10/2013

## 2013-11-10 NOTE — Progress Notes (Signed)
Physical Therapy Treatment Patient Details Name: Andrea Morgan MRN: [id number] DOB: Jun 14, 1950 Today's Date: November 13, 2013    History of Present Illness s/p laparoscopic R colectomy    PT Comments    Marked improvement in activity tolerance with decreased nausea.  Follow Up Recommendations  No PT follow up     Equipment Recommendations  3in1 (PT)    Recommendations for Other Services OT consult     Precautions / Restrictions Precautions Precautions: Fall;Other (comment) Precaution Comments: Post abdominal surgery Restrictions Weight Bearing Restrictions: No    Mobility  Bed Mobility Overal bed mobility: Needs Assistance Bed Mobility: Supine to Sit     Supine to sit: Supervision     General bed mobility comments: cues for sequencing and correct log roll technique  Transfers Overall transfer level: Needs assistance Equipment used: Rolling walker (2 wheeled) Transfers: Sit to/from Stand Sit to Stand: Min guard         General transfer comment: cues  for transition position and use of UEs to self assist  Ambulation/Gait Ambulation/Gait assistance: Min guard;Supervision Ambulation Distance (Feet): 500 Feet Assistive device: Rolling walker (2 wheeled) Gait Pattern/deviations: Step-through pattern;Shuffle;Trunk flexed     General Gait Details: min cues for posture and position from [employer]            Wheelchair Mobility    Modified Rankin (Stroke Patients Only)       Balance                                    Cognition Arousal/Alertness: Awake/alert Behavior During Therapy: WFL for tasks assessed/performed Overall Cognitive Status: Within Functional Limits for tasks assessed                      Exercises      General Comments        Pertinent Vitals/Pain 4/10; pca used    Home Living                      Prior Function            PT Goals (current goals can now be found in the care plan  section) Acute Rehab PT Goals Patient Stated Goal: Home. PT Goal Formulation: With patient Time For Goal Achievement: 11/16/13 Potential to Achieve Goals: Good Progress towards PT goals: Progressing toward goals    Frequency  Min 3X/week    PT Plan Current plan remains appropriate    Co-evaluation             End of Session   Activity Tolerance: Patient tolerated treatment well Patient left: in chair;with call bell/phone within reach     Time: 3893-7342 PT Time Calculation (min): 20 min  Charges:  $Gait Training: 8-22 mins                    G Codes:      Makaiah Terwilliger 11-13-13, 2:16 PM

## 2013-11-11 ENCOUNTER — Other Ambulatory Visit (HOSPITAL_COMMUNITY): Payer: Medicare Other

## 2013-11-11 ENCOUNTER — Inpatient Hospital Stay (HOSPITAL_COMMUNITY): Payer: Medicare Other

## 2013-11-11 DIAGNOSIS — I4891 Unspecified atrial fibrillation: Secondary | ICD-10-CM

## 2013-11-11 LAB — BASIC METABOLIC PANEL
Anion gap: 11 (ref 5–15)
BUN: 17 mg/dL (ref 6–23)
CALCIUM: 9.1 mg/dL (ref 8.4–10.5)
CO2: 26 mEq/L (ref 19–32)
CREATININE: 0.68 mg/dL (ref 0.50–1.10)
Chloride: 101 mEq/L (ref 96–112)
GFR calc Af Amer: >90 mL/min (ref >90)
GFR, EST NON AFRICAN AMERICAN: 90 mL/min — AB (ref >90)
GLUCOSE: 101 mg/dL — AB (ref 70–99)
Potassium: 3.9 mEq/L (ref 3.7–5.3)
Sodium: 138 mEq/L (ref 137–147)

## 2013-11-11 LAB — PROTIME-INR
INR: 0.98 (ref 0.00–1.49)
PROTHROMBIN TIME: 13 s (ref 11.6–15.2)

## 2013-11-11 LAB — CBC
HEMATOCRIT: 30.1 % — AB (ref 36.0–46.0)
Hemoglobin: 9.9 g/dL — ABNORMAL LOW (ref 12.0–15.0)
MCH: 28.2 pg (ref 26.0–34.0)
MCHC: 32.9 g/dL (ref 30.0–36.0)
MCV: 85.8 fL (ref 78.0–100.0)
Platelets: 294 10*3/uL (ref 150–400)
RBC: 3.51 MIL/uL — ABNORMAL LOW (ref 3.87–5.11)
RDW: 16.1 % — AB (ref 11.5–15.5)
WBC: 12.3 10*3/uL — ABNORMAL HIGH (ref 4.0–10.5)

## 2013-11-11 LAB — APTT: aPTT: 28 seconds (ref 24–37)

## 2013-11-11 LAB — HEPARIN LEVEL (UNFRACTIONATED): Heparin Unfractionated: 0.73 IU/mL — ABNORMAL HIGH (ref 0.30–0.70)

## 2013-11-11 MED ORDER — DILTIAZEM HCL 100 MG IV SOLR
5.0000 mg/h | INTRAVENOUS | Status: DC
  Administered 2013-11-11: 5 mg/h via INTRAVENOUS
  Administered 2013-11-11 – 2013-11-12 (×2): 7.5 mg/h via INTRAVENOUS
  Filled 2013-11-11: qty 100

## 2013-11-11 MED ORDER — MORPHINE SULFATE 15 MG PO TABS
15.0000 mg | ORAL_TABLET | ORAL | Status: DC | PRN
  Administered 2013-11-12 (×2): 30 mg via ORAL
  Filled 2013-11-11 (×2): qty 2

## 2013-11-11 MED ORDER — HEPARIN BOLUS VIA INFUSION
2000.0000 [IU] | Freq: Once | INTRAVENOUS | Status: AC
  Administered 2013-11-11: 2000 [IU] via INTRAVENOUS
  Filled 2013-11-11: qty 2000

## 2013-11-11 MED ORDER — HEPARIN (PORCINE) IN NACL 100-0.45 UNIT/ML-% IJ SOLN
1000.0000 [IU]/h | INTRAMUSCULAR | Status: DC
  Filled 2013-11-11: qty 250

## 2013-11-11 MED ORDER — HEPARIN (PORCINE) IN NACL 100-0.45 UNIT/ML-% IJ SOLN
1150.0000 [IU]/h | INTRAMUSCULAR | Status: DC
  Administered 2013-11-11: 1150 [IU]/h via INTRAVENOUS
  Filled 2013-11-11: qty 250

## 2013-11-11 MED ORDER — DILTIAZEM LOAD VIA INFUSION
10.0000 mg | Freq: Once | INTRAVENOUS | Status: AC
  Administered 2013-11-11: 10 mg via INTRAVENOUS
  Filled 2013-11-11: qty 10

## 2013-11-11 NOTE — Progress Notes (Signed)
ANTICOAGULATION CONSULT NOTE - Follow Up Consult  Pharmacy Consult for Heparin Indication: atrial fibrillation  Allergies  Allergen Reactions  . Oxycodone     Itching     Pt states she does ok with hydrocodone  . Sulfa Antibiotics Hives    Patient Measurements: Height: 5' 6.5" (168.9 cm) Weight: 171 lb (77.565 kg) (as per PST on 11/07/13) IBW/kg (Calculated) : 60.45 Heparin Dosing Weight: 75.2 kg  Vital Signs: Temp: 98.3 F (36.8 C) (07/26 1342) Temp src: Oral (07/26 1342) BP: 100/52 mmHg (07/26 1342) Pulse Rate: 83 (07/26 1342)  Labs:  Recent Labs  11/09/13 0510 11/10/13 0536 11/11/13 0500 11/11/13 1025 11/11/13 1705  HGB 11.3* 10.2* 9.9*  --   --   HCT 34.2* 31.0* 30.1*  --   --   PLT 312 301 294  --   --   APTT  --   --   --  28  --   LABPROT  --   --   --  13.0  --   INR  --   --   --  0.98  --   HEPARINUNFRC  --   --   --   --  0.73*  CREATININE 0.66 0.63 0.68  --   --     Estimated Creatinine Clearance: 74.5 ml/min (by C-G formula based on Cr of 0.68).   Medications:  Scheduled:  . alvimopan  12 mg Oral BID  . antiseptic oral rinse  15 mL Mouth Rinse q12n4p  . atorvastatin  20 mg Oral QHS  . calcium carbonate  1 tablet Oral BID  . chlorhexidine  15 mL Mouth Rinse BID  . morphine   Intravenous 6 times per day  . pantoprazole (PROTONIX) IV  40 mg Intravenous Q24H  . zolpidem  5 mg Oral QHS   Infusions:  . dextrose 5 % and 0.45 % NaCl with KCl 20 mEq/L 10 mL/hr at 11/11/13 0717  . diltiazem (CARDIZEM) infusion 7.5 mg/hr (11/11/13 1615)  . heparin 1,150 Units/hr (11/11/13 1125)   PRN: albuterol, diphenhydrAMINE, diphenhydrAMINE, LORazepam, LORazepam, morphine, naloxone, ondansetron (ZOFRAN) IV, promethazine, sodium chloride  Assessment: 63yo F underwent laparoscopic right colectomy on 7/23. Developed A.fib on 7/26. Pharmacy consulted to dose heparin drip.  First heparin level slightly supratherapeutic (0.73) after 2000 units IV bolus and 1150  units/hr  No bleeding or IV issues reported per RN  Goal of Therapy:  Heparin level 0.3-0.7 units/ml Monitor platelets by anticoagulation protocol: Yes   Plan:   Decrease heparin 1000 units/hr  Recheck heparin level in [place], PharmD, BCPS Pager: [phone] 11/11/2013,5:46 PM

## 2013-11-11 NOTE — Progress Notes (Signed)
Patients PCA alarming, HR 150. RN at bedside to check radial pulse, pulse rapid and irregular. EKG obtained, showed HR 177 Afib w/ RVR. Patient asymptomatic, BP 117/64. No shortness of breath, patient denies chest pain. Dr. Excell Seltzer called and made aware of patients EKG. Orders received, ongoing monitoring.

## 2013-11-11 NOTE — Consult Note (Addendum)
CARDIOLOGY CONSULT NOTE   Patient ID: Andrea Morgan MRN: [id number], DOB/AGE: 10-03-1950   Admit date: 11/08/2013 Date of Consult: 11/11/2013   Primary Physician: [name], MD Primary Cardiologist: None  Pt. Profile  63 year old woman with no prior cardiac history developed atrial fibrillation 3 days postop colon surgery.  Problem List  Past Medical History  Diagnosis Date  . Insomnia   . Chronic headaches   . Allergic rhinitis   . Depression   . Depression   . PONV (postoperative nausea and vomiting)   . Shortness of breath     with exercsie  . GERD (gastroesophageal reflux disease)   . H/O hiatal hernia   . History of kidney stones   . Anemia   . COPD (chronic obstructive pulmonary disease)   . Emphysema of lung   . Hyperlipidemia   . Anxiety     experienced panic attacks at home after her last surgery  . Arthritis     arthritis left jaw / pain    . 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 Stiffness in joint     neck - s/p cervical fusion    Past Surgical History  Procedure Laterality Date  . Appendectomy  1978  . Tonsillectomy  1971  . Cervical fusion  1997  . Video bronchoscopy  05/11/2011    Procedure: VIDEO BRONCHOSCOPY WITH FLUORO;  Surgeon: Collene Gobble, MD;  Location: [facility];  Service: Cardiopulmonary;  Laterality: Bilateral;  . Cervical fusion    . Esophagogastroduodenoscopy  05/21/2011    Procedure: ESOPHAGOGASTRODUODENOSCOPY (EGD);  Surgeon: Beryle Beams, MD;  Location: [facility];  Service: Endoscopy;  Laterality: N/A;  . Video bronchoscopy  06/02/2011    Procedure: VIDEO BRONCHOSCOPY;  Surgeon: Grace Isaac, MD;  Location: [facility];  Service: Thoracic;  Laterality: N/A;  . Mass excision  06/02/2011    Procedure: EXCISION MASS;  Surgeon: Grace Isaac, MD;  Location: [facility];  Service: Thoracic;;     Allergies  Allergies  Allergen Reactions  .  Oxycodone     Itching     Pt states she does ok with hydrocodone  . Sulfa Antibiotics Hives    HPI   This 63 year old woman is a medical patient of Dr. Ardeth Perfect.  She has been in good health in terms of her cardiovascular system.  She does not have any history of diabetes or hypertension.  She does have a history of borderline elevated cholesterol.  She walks on a home treadmill 3 times a week for exercise.  She has no history of known valvular heart disease and no history of ischemic heart disease. This morning she was noted to have gone into atrial fibrillation with a rapid ventricular response.  This was not associated with any chest pain or shortness of breath.  IV Cardizem was started and she was moved to telemetry.  Inpatient Medications  . alvimopan  12 mg Oral BID  . antiseptic oral rinse  15 mL Mouth Rinse q12n4p  . atorvastatin  20 mg Oral QHS  . calcium carbonate  1 tablet Oral BID  . chlorhexidine  15 mL Mouth Rinse BID  . enoxaparin (LOVENOX) injection  40 mg Subcutaneous Q24H  . morphine   Intravenous 6 times per day  . pantoprazole (PROTONIX) IV  40 mg Intravenous Q24H  . zolpidem  5 mg Oral QHS  Family History Family History  Problem Relation Age of Onset  . Asthma Mother   . Heart disease Mother   . Asthma Maternal Grandmother   . Lung cancer Father   . Cancer Father   . Ovarian cancer Maternal Aunt   . Breast cancer Maternal Aunt     x2  . Prostate cancer Maternal Uncle   . Kidney cancer Sister   . Anesthesia problems Neg Hx   . Colon cancer Brother      Social History History   Social History  . Marital Status: Divorced    Spouse Name: N/A    Number of Children: N/A  . Years of Education: N/A   Occupational History  . caregiver [employer]   Social History Main Topics  . Smoking status: Former Smoker -- 1.00 packs/day for 45 years    Types: Cigarettes    Quit date: 04/14/2011  . Smokeless tobacco: Never Used  . Alcohol Use: No      Comment: smokes pot every once in a while  . Drug Use: Yes    Special: Marijuana  . Sexual Activity: Not on file   Other Topics Concern  . Not on file   Social History Narrative  . No narrative on file     Review of Systems  General:  No chills, fever, night sweats or weight changes.  Cardiovascular:  No chest pain, dyspnea on exertion, edema, orthopnea, palpitations, paroxysmal nocturnal dyspnea. Dermatological: No rash, lesions/masses Respiratory: No cough, dyspnea Urologic: No hematuria, dysuria Abdominal:  3 days postop colon surgery Neurologic:  No visual changes, wkns, changes in mental status. All other systems reviewed and are otherwise negative except as noted above.  Physical Exam  Blood pressure 106/62, pulse 115, temperature 98.1 F (36.7 C), temperature source Oral, resp. rate 12, height 5' 6.5" (1.689 m), weight 171 lb (77.565 kg), SpO2 98.00%.  General: Pleasant, NAD Psych: Normal affect. Neuro: Alert and oriented X 3. Moves all extremities spontaneously. HEENT: Normal  Neck: Supple without bruits or JVD. Lungs:  Resp regular and unlabored, CTA. Heart: Irregularly irregular pulse.  No murmur gallop or rub. Abdomen: Soft, non-tender, non-distended, BS + x 4.  Incision is intact and dry Extremities: No clubbing, cyanosis or edema. DP/PT/Radials 2+ and equal bilaterally.  Labs  No results found for this basename: CKTOTAL, CKMB, TROPONINI,  in the last 72 hours Lab Results  Component Value Date   WBC 12.3* 11/11/2013   HGB 9.9* 11/11/2013   HCT 30.1* 11/11/2013   MCV 85.8 11/11/2013   PLT 294 11/11/2013     Recent Labs Lab 11/11/13 0500  NA 138  K 3.9  CL 101  CO2 26  BUN 17  CREATININE 0.68  CALCIUM 9.1  GLUCOSE 101*   No results found for this basename: CHOL,  HDL,  LDLCALC,  TRIG   Lab Results  Component Value Date   DDIMER 1.06* 11/05/2012    Radiology/Studies  No results found.  ECG  Update EKG requested  ASSESSMENT AND PLAN  1.  paroxysmal atrial fibrillation postoperatively.  Chadsvasc score is 2 for age and female sex. 2. status post laparoscopic partial right colectomy 3. history of COPD 4. history of hypercholesterolemia 5. history of excision of right lower lobe lung cancer 2013  Plan: Continue IV Cardizem drip.  Start IV heparin.  Discussed with Dr. Excell Seltzer. Update EKG.  Obtain echocardiogram.  Update chest x-ray  Signed, Darlin Coco, MD  11/11/2013, 9:27 AM

## 2013-11-11 NOTE — Progress Notes (Signed)
ANTICOAGULATION CONSULT NOTE - Initial Consult  Pharmacy Consult for Heparin Indication: atrial fibrillation  Allergies  Allergen Reactions  . Oxycodone     Itching     Pt states she does ok with hydrocodone  . Sulfa Antibiotics Hives    Patient Measurements: Height: 5' 6.5" (168.9 cm) Weight: 171 lb (77.565 kg) (as per PST on 11/07/13) IBW/kg (Calculated) : 60.45 Heparin Dosing Weight: 75.2 kg  Vital Signs: Temp: 98.1 F (36.7 C) (07/26 0710) Temp src: Oral (07/26 0710) BP: 106/62 mmHg (07/26 0854) Pulse Rate: 115 (07/26 0854)  Labs:  Recent Labs  11/09/13 0510 11/10/13 0536 11/11/13 0500  HGB 11.3* 10.2* 9.9*  HCT 34.2* 31.0* 30.1*  PLT 312 301 294  CREATININE 0.66 0.63 0.68    Estimated Creatinine Clearance: 74.5 ml/min (by C-G formula based on Cr of 0.68).   Medical History: Past Medical History  Diagnosis Date  . Insomnia   . Chronic headaches   . Allergic rhinitis   . Depression   . Depression   . PONV (postoperative nausea and vomiting)   . Shortness of breath     with exercsie  . GERD (gastroesophageal reflux disease)   . H/O hiatal hernia   . History of kidney stones   . Anemia   . COPD (chronic obstructive pulmonary disease)   . Emphysema of lung   . Hyperlipidemia   . Anxiety     experienced panic attacks at home after her last surgery  . Arthritis     arthritis left jaw / pain    . 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 Stiffness in joint     neck - s/p cervical fusion    Medications:  Scheduled:  . alvimopan  12 mg Oral BID  . antiseptic oral rinse  15 mL Mouth Rinse q12n4p  . atorvastatin  20 mg Oral QHS  . calcium carbonate  1 tablet Oral BID  . chlorhexidine  15 mL Mouth Rinse BID  . enoxaparin (LOVENOX) injection  40 mg Subcutaneous Q24H  . morphine   Intravenous 6 times per day  . pantoprazole (PROTONIX) IV  40 mg Intravenous Q24H  . zolpidem  5 mg  Oral QHS   Infusions:  . dextrose 5 % and 0.45 % NaCl with KCl 20 mEq/L 10 mL/hr at 11/11/13 0717  . diltiazem (CARDIZEM) infusion 7.5 mg/hr (11/11/13 0853)    Assessment: 63yo F underwent laparoscopic right colectomy on 7/23. Developed A.fib on 7/26. Pharmacy is asked to start a heparin drip.  H/H this am a little low. Platelets are wnl.   No bleeding reported/documented.   Goal of Therapy:  Heparin level 0.3-0.7 units/ml Monitor platelets by anticoagulation protocol: Yes   Plan:   Draw baseline aPTT and PT/INR.  DC Lovenox, last dose received was 40mg  >24hrs ago.  Give heparin 2000 units IV bolus then start infusion at 1150 units/hr.  Check heparin level in 6hrs and daily thereafter.  Check CBC daily while on heparin.  F/u daily.  Romeo Rabon, PharmD, pager [phone]. 11/11/2013,10:11 AM.

## 2013-11-11 NOTE — Progress Notes (Signed)
Patient ID: Andrea Morgan, female   DOB: [birth year], 63 y.o.   MRN: [id number] 3 Days Post-Op  Subjective: Vomited once last night (reflux Sxs per pt). No nausea or pain this AM.  Hungry.  Small BM. HR to 170, a-fib this AM and transferred to telemetry.  No CP or SOB  Objective: Vital signs in last 24 hours: Temp:  [98 F (36.7 C)-98.3 F (36.8 C)] 98.1 F (36.7 C) (07/26 0710) Pulse Rate:  [78-177] 104 (07/26 0710) Resp:  [11-22] 16 (07/26 0710) BP: (98-192)/(52-73) 98/52 mmHg (07/26 0710) SpO2:  [92 %-98 %] 92 % (07/26 0710) FiO2 (%):  [16 %] 16 % (07/25 1148) Last BM Date: 11/10/13  Intake/Output from previous day: 07/25 0701 - 07/26 0700 In: 2264.2 [P.O.:1310; I.V.:954.2] Out: 3950 [Urine:3950] Intake/Output this shift:    General appearance: alert, cooperative and no distress Cardio: irregularly irregular rhythm GI: normal findings: soft, non-tender Incision/Wound: Dressing clean and dry  Lab Results:   Recent Labs  11/10/13 0536 11/11/13 0500  WBC 14.9* 12.3*  HGB 10.2* 9.9*  HCT 31.0* 30.1*  PLT 301 294   BMET  Recent Labs  11/10/13 0536 11/11/13 0500  NA 135* 138  K 4.2 3.9  CL 98 101  CO2 29 26  GLUCOSE 108* 101*  BUN 17 17  CREATININE 0.63 0.68  CALCIUM 9.1 9.1     Studies/Results: No results found.  Anti-infectives: Anti-infectives   Start     Dose/Rate Route Frequency Ordered Stop   11/09/13 0000  cefoTEtan (CEFOTAN) 2 g in dextrose 5 % 50 mL IVPB     2 g 100 mL/hr over 30 Minutes Intravenous Every 12 hours 11/08/13 1650 11/08/13 2338   11/08/13 1100  cefoTEtan (CEFOTAN) 2 g in dextrose 5 % 50 mL IVPB     2 g 100 mL/hr over 30 Minutes Intravenous On call to O.R. 11/08/13 1044 11/08/13 1232   11/08/13 1034  cefoTEtan (CEFOTAN) 1 g in dextrose 5 % 50 mL IVPB  Status:  Discontinued     1 g 100 mL/hr over 30 Minutes Intravenous On call to O.R. 11/08/13 1034 11/08/13 1043      Assessment/Plan: s/p Procedure(s): laparoscopic  partial right colectomy ANAL EXAM UNDER ANESTHESIA WITH BIOPSY New a-fib, rate controlled with cardizem.  Cardiology to see Progressing well from surgery. Advance diet.  Change to oral pain meds    LOS: 3 days    Sehaj Mcenroe T 11/11/2013

## 2013-11-12 ENCOUNTER — Encounter (HOSPITAL_COMMUNITY): Payer: Self-pay | Admitting: General Surgery

## 2013-11-12 DIAGNOSIS — I4891 Unspecified atrial fibrillation: Secondary | ICD-10-CM

## 2013-11-12 LAB — CBC
HCT: 30.1 % — ABNORMAL LOW (ref 36.0–46.0)
HEMOGLOBIN: 9.9 g/dL — AB (ref 12.0–15.0)
MCH: 28 pg (ref 26.0–34.0)
MCHC: 32.9 g/dL (ref 30.0–36.0)
MCV: 85.3 fL (ref 78.0–100.0)
Platelets: 342 10*3/uL (ref 150–400)
RBC: 3.53 MIL/uL — ABNORMAL LOW (ref 3.87–5.11)
RDW: 16.2 % — ABNORMAL HIGH (ref 11.5–15.5)
WBC: 10.9 10*3/uL — AB (ref 4.0–10.5)

## 2013-11-12 LAB — HEPARIN LEVEL (UNFRACTIONATED)
HEPARIN UNFRACTIONATED: 0.49 [IU]/mL (ref 0.30–0.70)
Heparin Unfractionated: 0.67 IU/mL (ref 0.30–0.70)

## 2013-11-12 MED ORDER — DILTIAZEM HCL 60 MG PO TABS
60.0000 mg | ORAL_TABLET | Freq: Three times a day (TID) | ORAL | Status: DC
  Administered 2013-11-12 – 2013-11-13 (×3): 60 mg via ORAL
  Filled 2013-11-12 (×6): qty 1

## 2013-11-12 NOTE — Care Management Note (Signed)
    Page 1 of 1   11/12/2013     3:33:50 PM CARE MANAGEMENT NOTE 11/12/2013  Patient:  Andrea Morgan, Andrea Morgan   Account Number:  [id number]  Date Initiated:  11/09/2013  Documentation initiated by:  Gabriel Earing  Subjective/Objective Assessment:   pt admitted for surgery     Action/Plan:   from home   Anticipated DC Date:  11/14/2013   Anticipated DC Plan:  [place]  CM consult      Choice offered to / List presented to:             Status of service:  In process, will continue to follow Medicare Important Message given?  NA - LOS <3 / Initial given by admissions (If response is "NO", the following Medicare IM given date fields will be blank) Date Medicare IM given:  11/12/2013 Medicare IM given by:  [facility] Date Additional Medicare IM given:   Additional Medicare IM given by:    Discharge Disposition:    Per UR Regulation:  Reviewed for med. necessity/level of care/duration of stay  If discussed at [place] of Stay Meetings, dates discussed:    Comments:  11/12/13 Peniel Hass RN,BSN NCM [phone] ECHO PEND.NO ANTICIPATED D/C NEEDS.  11/09/13 MMcGibboney, RN, BSN Chart reviewed.

## 2013-11-12 NOTE — Progress Notes (Signed)
Pharmacy Brief Note - Alvimopan (Entereg)  The standing order set for alvimopan (Entereg) now includes an automatic order to discontinue the drug after the patient has had a bowel movement.  The change was approved by the [place] and the Medical Executive Committee.    This patient has had a bowel movement documented by nursing.  Therefore, alvimopan has been discontinued.  If there are questions, please contact the pharmacy at [phone].  Thank you-  Kizzie Furnish, PharmD Pager: [phone] 11/12/2013 9:15 AM

## 2013-11-12 NOTE — Progress Notes (Signed)
ANTICOAGULATION CONSULT NOTE - Follow Up Consult  Pharmacy Consult for Heparin Indication: atrial fibrillation  Allergies  Allergen Reactions  . Oxycodone     Itching     Pt states she does ok with hydrocodone  . Sulfa Antibiotics Hives    Patient Measurements: Height: 5' 6.5" (168.9 cm) Weight: 171 lb (77.565 kg) (as per PST on 11/07/13) IBW/kg (Calculated) : 60.45  Vital Signs: Temp: 97.7 F (36.5 C) (07/27 0656) Temp src: Oral (07/27 0656) BP: 113/50 mmHg (07/27 0656) Pulse Rate: 70 (07/27 0656)  Labs:  Recent Labs  11/10/13 0536 11/11/13 0500 11/11/13 1025 11/11/13 1705 11/12/13 0029 11/12/13 0030 11/12/13 0745  HGB 10.2* 9.9*  --   --  9.9*  --   --   HCT 31.0* 30.1*  --   --  30.1*  --   --   PLT 301 294  --   --  342  --   --   APTT  --   --  28  --   --   --   --   LABPROT  --   --  13.0  --   --   --   --   INR  --   --  0.98  --   --   --   --   HEPARINUNFRC  --   --   --  0.73*  --  0.49 0.67  CREATININE 0.63 0.68  --   --   --   --   --     Estimated Creatinine Clearance: 74.5 ml/min (by C-G formula based on Cr of 0.68).   Medications:  Infusions:  . dextrose 5 % and 0.45 % NaCl with KCl 20 mEq/L 10 mL/hr at 11/11/13 0717  . diltiazem (CARDIZEM) infusion 7.5 mg/hr (11/12/13 0420)  . heparin      Assessment: 63yo F underwent laparoscopic right colectomy on 7/23. Developed A.fib on 7/26. Pharmacy consulted to dose heparin drip.   7/27 0800 Heparin level = 0.67 @ 1000units/hr   No bleeding or IV issues  Goal of Therapy:  Heparin level 0.3-0.7 units/ml Monitor platelets by anticoagulation protocol: Yes   Plan:  Continue heparin drip at current rate Daily Heparin level  Kizzie Furnish, PharmD Pager: [phone] 11/12/2013 8:15 AM

## 2013-11-12 NOTE — Progress Notes (Signed)
PT Cancellation Note  Patient Details Name: Andrea Morgan MRN: [id number] DOB: 12/07/1950   Cancelled Treatment:    Reason Eval/Treat Not Completed: attempted PT eval-pt declined to participate-"rough day...just want to take a nap." pt adamant about not getting up at this time. will attempt to check back another day. thanks.    Weston Anna, MPT Pager: [phone]

## 2013-11-12 NOTE — Progress Notes (Signed)
Echocardiogram 2D Echocardiogram has been performed.  Andrea Morgan 11/12/2013, 10:41 AM

## 2013-11-12 NOTE — Progress Notes (Signed)
Patient ID: Andrea Morgan, female   DOB: 1950/10/09, 63 y.o.   MRN: [id number] 4 Days Post-Op  Subjective: Feels great.  Had a BM.  No nausea.  Tolerating a reg diet.  In NSR  Objective: Vital signs in last 24 hours: Temp:  [97.5 F (36.4 C)-98.3 F (36.8 C)] 97.7 F (36.5 C) (07/27 0656) Pulse Rate:  [70-115] 70 (07/27 0656) Resp:  [9-18] 14 (07/27 0656) BP: (100-113)/(46-62) 113/50 mmHg (07/27 0656) SpO2:  [92 %-98 %] 96 % (07/27 0656) Last BM Date: 11/11/13  Intake/Output from previous day: 07/26 0701 - 07/27 0700 In: 1313.3 [P.O.:240; I.V.:1073.3] Out: 600 [Urine:600] Intake/Output this shift:    General appearance: alert, cooperative and no distress Cardio: regular rhythm GI: normal findings: soft, non-tender Incision/Wound: Dressing clean and dry  Lab Results:   Recent Labs  11/11/13 0500 11/12/13 0029  WBC 12.3* 10.9*  HGB 9.9* 9.9*  HCT 30.1* 30.1*  PLT 294 342   BMET  Recent Labs  11/10/13 0536 11/11/13 0500  NA 135* 138  K 4.2 3.9  CL 98 101  CO2 29 26  GLUCOSE 108* 101*  BUN 17 17  CREATININE 0.63 0.68  CALCIUM 9.1 9.1     Studies/Results: Dg Chest 1 View  11/11/2013   CLINICAL DATA:  Emphysema; history of previous lung carcinoma  EXAM: CHEST - 1 VIEW  COMPARISON:  Chest radiograph November 24, 2011 and chest CT April 26, 2013  FINDINGS: There is focal infiltrate in the left lower lobe. There is underlying emphysematous change. There is a questionable minimal right pleural effusion. Elsewhere lungs are clear. Heart size is normal. Pulmonary vascularity reflects underlying emphysema. No adenopathy. No bone lesions.  IMPRESSION: Left base infiltrate. Underlying emphysema. Question minimal right pleural effusion.   Electronically Signed   By: Lowella Grip M.D.   On: 11/11/2013 15:22    Anti-infectives: Anti-infectives   Start     Dose/Rate Route Frequency Ordered Stop   11/09/13 0000  cefoTEtan (CEFOTAN) 2 g in dextrose 5 % 50 mL IVPB      2 g 100 mL/hr over 30 Minutes Intravenous Every 12 hours 11/08/13 1650 11/08/13 2338   11/08/13 1100  cefoTEtan (CEFOTAN) 2 g in dextrose 5 % 50 mL IVPB     2 g 100 mL/hr over 30 Minutes Intravenous On call to O.R. 11/08/13 1044 11/08/13 1232   11/08/13 1034  cefoTEtan (CEFOTAN) 1 g in dextrose 5 % 50 mL IVPB  Status:  Discontinued     1 g 100 mL/hr over 30 Minutes Intravenous On call to O.R. 11/08/13 1034 11/08/13 1043      Assessment/Plan: s/p Procedure(s): laparoscopic partial right colectomy ANAL EXAM UNDER ANESTHESIA WITH BIOPSY  NSR right now, on IV cardizem and hep gtt.  Cardiology to see.  Ok to d/c hep, switch to PO meds? Progressing well from surgery. D/C PCA.  Change to oral pain meds D/C in AM if ok with cardiology    LOS: 4 days    Juanpablo Ciresi C. 06/26/4074

## 2013-11-12 NOTE — Progress Notes (Signed)
ANTICOAGULATION CONSULT NOTE - Follow Up Consult  Pharmacy Consult for Heparin Indication: atrial fibrillation  Allergies  Allergen Reactions  . Oxycodone     Itching     Pt states she does ok with hydrocodone  . Sulfa Antibiotics Hives    Patient Measurements: Height: 5' 6.5" (168.9 cm) Weight: 171 lb (77.565 kg) (as per PST on 11/07/13) IBW/kg (Calculated) : 60.45 Heparin Dosing Weight:   Vital Signs: Temp: 97.9 F (36.6 C) (07/26 2214) Temp src: Oral (07/26 2214) BP: 103/46 mmHg (07/26 2214) Pulse Rate: 78 (07/26 2214)  Labs:  Recent Labs  11/09/13 0510 11/10/13 0536 11/11/13 0500 11/11/13 1025 11/11/13 1705 11/12/13 0029 11/12/13 0030  HGB 11.3* 10.2* 9.9*  --   --  9.9*  --   HCT 34.2* 31.0* 30.1*  --   --  30.1*  --   PLT 312 301 294  --   --  342  --   APTT  --   --   --  28  --   --   --   LABPROT  --   --   --  13.0  --   --   --   INR  --   --   --  0.98  --   --   --   HEPARINUNFRC  --   --   --   --  0.73*  --  0.49  CREATININE 0.66 0.63 0.68  --   --   --   --     Estimated Creatinine Clearance: 74.5 ml/min (by C-G formula based on Cr of 0.68).   Medications:  Infusions:  . dextrose 5 % and 0.45 % NaCl with KCl 20 mEq/L 10 mL/hr at 11/11/13 0717  . diltiazem (CARDIZEM) infusion 7.5 mg/hr (11/11/13 1615)  . heparin      Assessment: Patient with heparin level at goal.  No issues per RN.  Goal of Therapy:  Heparin level 0.3-0.7 units/ml Monitor platelets by anticoagulation protocol: Yes   Plan:  Continue heparin drip at current rate Recheck level 0800  Tyler Deis, Shea Stakes [place] 11/12/2013,1:30 AM

## 2013-11-12 NOTE — Progress Notes (Signed)
   Subjective:  Feels better ; no SOB or CP, no irregular HR/palpitations  Objective:  Vital Signs in the last 24 hours: Temp:  [97.5 F (36.4 C)-98.3 F (36.8 C)] 97.7 F (36.5 C) (07/27 0656) Pulse Rate:  [70-83] 70 (07/27 0656) Resp:  [9-18] 12 (07/27 0830) BP: (100-113)/(46-52) 113/50 mmHg (07/27 0656) SpO2:  [92 %-98 %] 95 % (07/27 0830)  Intake/Output from previous day: 07/26 0701 - 07/27 0700 In: 1313.3 [P.O.:240; I.V.:1073.3] Out: 600 [Urine:600] Intake/Output from this shift:    Physical Exam: General appearance: alert, cooperative, appears stated age, no distress and moderately obese Neck: no adenopathy, no carotid bruit and no JVD Lungs: clear to auscultation bilaterally and normal percussion bilaterally Heart: regular rate and rhythm, S1, S2 normal, no murmur, click, rub or gallop Abdomen: soft, non-tender; bowel sounds normal; no masses,  no organomegaly and post-op tenderness Extremities: extremities normal, atraumatic, no cyanosis or edema Pulses: 2+ and symmetric Neurologic: Grossly normal  Lab Results:  Recent Labs  11/11/13 0500 11/12/13 0029  WBC 12.3* 10.9*  HGB 9.9* 9.9*  PLT 294 342    Recent Labs  11/10/13 0536 11/11/13 0500  NA 135* 138  K 4.2 3.9  CL 98 101  CO2 29 26  GLUCOSE 108* 101*  BUN 17 17  CREATININE 0.63 0.68   Imaging: CXR reviewed: Left base infiltrate. Underlying emphysema. Question minimal right  pleural effusion.  Tele:  NSR  Cardiac Studies:  Assessment/Plan:  Active Problems:   Atrial fibrillation with RVR - post-operative    Colon polyp  Post-operative Afib with RVR with apparent conversion on IV CCB gttt -- check EKG to confirm;  Convert from IV to PO CCB today (60 tid) (overlap 2hr)  Echo pending today  Needs to ambulate to follow HR repsonse.  Suspect that this may well be an isolated incident & probably will not need long term anticoagulation.  Will d/c Heparin.  Pending Echo & HR control on  PO CCB today, may well be ready for d/c in AM from cardiac standpoint (would need to consolidate PO dose of CCB).  BP is stable, but need to ensure that she is not hypotensive on PO regimen.  Will arrange OP f/y with Dr. Mare Ferrari - may warrant OP ST +/- event monitor.   LOS: 4 days    Decari Duggar W 11/12/2013, 9:19 AM

## 2013-11-13 ENCOUNTER — Encounter (HOSPITAL_COMMUNITY): Payer: Self-pay | Admitting: Cardiology

## 2013-11-13 LAB — CBC
HCT: 28 % — ABNORMAL LOW (ref 36.0–46.0)
Hemoglobin: 9.3 g/dL — ABNORMAL LOW (ref 12.0–15.0)
MCH: 28.1 pg (ref 26.0–34.0)
MCHC: 33.2 g/dL (ref 30.0–36.0)
MCV: 84.6 fL (ref 78.0–100.0)
Platelets: 306 10*3/uL (ref 150–400)
RBC: 3.31 MIL/uL — AB (ref 3.87–5.11)
RDW: 15.8 % — ABNORMAL HIGH (ref 11.5–15.5)
WBC: 7.5 10*3/uL (ref 4.0–10.5)

## 2013-11-13 MED ORDER — DILTIAZEM HCL 90 MG PO TABS: 90.0000 mg | ORAL_TABLET | Freq: Two times a day (BID) | ORAL | Status: DC

## 2013-11-13 MED ORDER — DILTIAZEM HCL 90 MG PO TABS
90.0000 mg | ORAL_TABLET | Freq: Two times a day (BID) | ORAL | Status: DC
  Filled 2013-11-13: qty 1

## 2013-11-13 MED ORDER — MORPHINE SULFATE 15 MG PO TABS: 15.0000 mg | ORAL_TABLET | ORAL | Status: DC | PRN

## 2013-11-13 NOTE — Discharge Summary (Signed)
Physician Discharge Summary  Patient ID: Andrea Morgan MRN: [id number] DOB/AGE: 07-15-1950 63 y.o.  Admit date: 11/08/2013 Discharge date: 11/13/2013  Admission Diagnoses:  Discharge Diagnoses:  Active Problems:   Colon polyp   Atrial fibrillation with RVR - post-operative    Discharged Condition: good  Hospital Course: The patient was admitted to the hospital after her right hemicolectomy for colon polyp. Her postop recovery was uneventful until postop day 3 when she developed atrial fibrillation with rapid ventricular response. She was placed on IV Cardizem and heparin drip. A cardiology consult was obtained. She converted back to normal sinus rhythm within 24 hours. A ultrasound of her heart showed no abnormal findings. She was transitioned to oral Cardizem and given a referral to followup with Dr. Majel Homer as an outpatient. By postop day 5 she was ambulating without difficulty and tolerating a soft diet. Her pain was controlled with oral medications. She will followup with me in 2 weeks.  Consults: cardiology  Significant Diagnostic Studies: cardiac graphics: Echocardiogram: normal EF, no atrial enlargement  Treatments: IV hydration, analgesia: acetaminophen w/ codeine and surgery: see above  Discharge Exam: Blood pressure 127/67, pulse 87, temperature 97.6 F (36.4 C), temperature source Oral, resp. rate 19, height 5' 6.5" (1.689 m), weight 171 lb (77.565 kg), SpO2 99.00%. General appearance: alert and cooperative GI: normal findings: soft, non-tender Incision/Wound: clean, intact  Disposition: 01-Home or Self Care     Medication List         albuterol 108 (90 BASE) MCG/ACT inhaler  Commonly known as:  PROVENTIL HFA;VENTOLIN HFA  Inhale 1 puff into the lungs every 6 (six) hours as needed for wheezing or shortness of breath.     atorvastatin 20 MG tablet  Commonly known as:  LIPITOR  Take 20 mg by mouth daily. Takes at bedtime     diltiazem 90 MG tablet   Commonly known as:  CARDIZEM  Take 1 tablet (90 mg total) by mouth every 12 (twelve) hours.     morphine 15 MG tablet  Commonly known as:  MSIR  Take 1-2 tablets (15-30 mg total) by mouth every 4 (four) hours as needed for severe pain.     omeprazole 20 MG tablet  Commonly known as:  PRILOSEC OTC  Take 20 mg by mouth daily. Takes at night     zolpidem 10 MG tablet  Commonly known as:  AMBIEN  Take 10 mg by mouth at bedtime.           Follow-up Information   Follow up with [name]., MD. Schedule an appointment as soon as possible for a visit in 2 weeks.   Specialty:  General Surgery   Contact information:   [address] [place] [address] [phone]       Follow up with Darlin Coco, MD. Schedule an appointment as soon as possible for a visit in 3 weeks.   Specialty:  Cardiology   Contact information:   [address] [place] [address] [phone]       Signed: [name] 09/14/4130, 44:01 AM

## 2013-11-13 NOTE — Discharge Instructions (Signed)

## 2013-11-13 NOTE — Progress Notes (Signed)
    Patient ID: Andrea Morgan, female   DOB: 23-Sep-1950, 63 y.o.   MRN: [id number]  Subjective:  Slept well.  No further Afib & no sensation of palpitations. No hypotension.  Objective:  Vital Signs in the last 24 hours: Temp:  [97.7 F (36.5 C)-98.1 F (36.7 C)] 98.1 F (36.7 C) (07/28 0431) Pulse Rate:  [70-85] 73 (07/28 0431) Resp:  [12-18] 18 (07/28 0431) BP: (90-129)/(45-71) 129/61 mmHg (07/28 0431) SpO2:  [93 %-99 %] 95 % (07/28 0431)  Intake/Output from previous day: 07/27 0701 - 07/28 0700 In: 300 [P.O.:300] Out: -  Intake/Output from this shift:    Physical Exam: General appearance: alert, cooperative, appears stated age, no distress and moderately obese; resting comfortably Neck: no adenopathy, no carotid bruit and no JVD Lungs: CTAB; normal percussion bilaterally; non-labored Heart: regular rate and rhythm, S1, S2 normal, no murmur, click, rub or gallop Extremities: extremities normal, atraumatic, no cyanosis or edema Neurologic: Grossly normal  Lab Results:  Recent Labs  11/12/13 0029 11/13/13 0433  WBC 10.9* 7.5  HGB 9.9* 9.3*  PLT 342 306    Recent Labs  11/11/13 0500  NA 138  K 3.9  CL 101  CO2 26  GLUCOSE 101*  BUN 17  CREATININE 0.68   Imaging: CXR reviewed: Left base infiltrate. Underlying emphysema. Question minimal right  pleural effusion.  Tele:  NSR  Cardiac Studies:   2D Echo 7/27: Normal LV size & function, EF 55-60%.  No valvular lesions or WMA.  Assessment/Plan:  Active Problems:   Atrial fibrillation with RVR - post-operative    Colon polyp - s/p Left Partial Hemicolectomy  Post-operative Afib with RVR with conversion on IV CCB gttt confirmed by EKG on 7/27 & remains in NSR on PO CCB  Convert from tid PO CCB to 90 mg BID for d/c  Echo relatively normal  Needs to ambulate to follow HR repsonse.  Suspect that this may well be an isolated incident & probably will not need long term anticoagulation.  Off of  Heparin.  With normal BP & maintaining NSR -- ready for d/c today from cardiac standpoint (would need to consolidate PO dose of CCB).   Will arrange OP f/y with Dr. Mare Ferrari - may warrant OP ST +/- event monitor.   LOS: 5 days    Jonique Kulig W 11/13/2013, 6:24 AM

## 2013-11-19 ENCOUNTER — Other Ambulatory Visit (INDEPENDENT_AMBULATORY_CARE_PROVIDER_SITE_OTHER): Payer: Self-pay

## 2013-11-19 ENCOUNTER — Telehealth (INDEPENDENT_AMBULATORY_CARE_PROVIDER_SITE_OTHER): Payer: Self-pay

## 2013-11-19 MED ORDER — MORPHINE SULFATE 15 MG PO TABS: 15.0000 mg | ORAL_TABLET | ORAL | Status: DC | PRN

## 2013-11-19 NOTE — Telephone Encounter (Signed)
That's fine.  Please have her alternate Tylenol and Ibuprofen and use the Morphine for break through.  15-30mg  PO, Q6h prn, Disp #30.

## 2013-11-19 NOTE — Telephone Encounter (Signed)
Called and spoke to patient to make aware rx for Morphine 15mg  will be at the front desk for pick up.  Patient unable to drive but, will send Amy Bary Leriche (niece) to pick up.

## 2013-11-19 NOTE — Telephone Encounter (Signed)
Pt s/p lap colectomy on 11/08/13 with Dr Marcello Moores. Pt is calling today to get a refill on her Morphine 15mg . Pt rates her pain a 6 at night and during the day it decreases to a 4. Advised pt that she can also take up to 800mg  every 8 hours as needed for pain relief. Informed pt that I would send Dr Marcello Moores a message for a refill request. Informed pt that if Dr Marcello Moores refills meds she will need to come by the office and pick up the rx. We will contact her as soon as we receive a response. Pt verbalized understanding.

## 2013-11-26 ENCOUNTER — Ambulatory Visit (INDEPENDENT_AMBULATORY_CARE_PROVIDER_SITE_OTHER): Payer: Medicare Other | Admitting: General Surgery

## 2013-11-26 ENCOUNTER — Encounter (INDEPENDENT_AMBULATORY_CARE_PROVIDER_SITE_OTHER): Payer: Self-pay

## 2013-11-26 ENCOUNTER — Encounter (INDEPENDENT_AMBULATORY_CARE_PROVIDER_SITE_OTHER): Payer: Self-pay | Admitting: General Surgery

## 2013-11-26 ENCOUNTER — Other Ambulatory Visit (INDEPENDENT_AMBULATORY_CARE_PROVIDER_SITE_OTHER): Payer: Self-pay

## 2013-11-26 VITALS — BP 114/60 | HR 82 | Temp 96.4°F | Ht 66.0 in | Wt 157.0 lb

## 2013-11-26 DIAGNOSIS — Z9889 Other specified postprocedural states: Secondary | ICD-10-CM

## 2013-11-26 NOTE — Progress Notes (Signed)
Andrea Morgan is a 63 y.o. female who is status post a lap R colectomy on 11/08/13.  She is doing better.  She is still having loose stools. She is using minimal narcotics.  Objective: Filed Vitals:   11/26/13 0929  BP: 114/60  Pulse: 82  Temp: 96.4 F (35.8 C)    General appearance: alert and cooperative GI: normal findings: soft, non-tender  Incision: healing well   Assessment: s/p  Patient Active Problem List   Diagnosis Date Noted  . Atrial fibrillation with RVR - post-operative  11/12/2013  . Colon polyp 11/08/2013  . COPD (chronic obstructive pulmonary disease) 04/26/2013  . 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 Chest mass 05/04/2011    Plan: Doing well after surgery. We reviewed her pathology which showed a polyp with high-grade dysplasia. 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 She will followup with Dr. Elmo Putt in one year for colonoscopy. I will see her back in the office as needed. I recommended that she continue lifting restrictions for another 6 weeks. Okay for her to return to work at the beginning of next month.    [name], [facility], [place]   11/26/2013 9:44 AM

## 2013-11-26 NOTE — Patient Instructions (Signed)
Call the office if he developed any problems.

## 2013-12-14 ENCOUNTER — Ambulatory Visit (INDEPENDENT_AMBULATORY_CARE_PROVIDER_SITE_OTHER): Payer: Medicare Other | Admitting: Nurse Practitioner

## 2013-12-14 ENCOUNTER — Encounter: Payer: Self-pay | Admitting: Nurse Practitioner

## 2013-12-14 VITALS — BP 120/70 | HR 73 | Ht 66.5 in | Wt 162.8 lb

## 2013-12-14 DIAGNOSIS — I4891 Unspecified atrial fibrillation: Secondary | ICD-10-CM

## 2013-12-14 DIAGNOSIS — F329 Major depressive disorder, single episode, unspecified: Secondary | ICD-10-CM | POA: Diagnosis not present

## 2013-12-14 DIAGNOSIS — J449 Chronic obstructive pulmonary disease, unspecified: Secondary | ICD-10-CM | POA: Diagnosis not present

## 2013-12-14 DIAGNOSIS — G4700 Insomnia, unspecified: Secondary | ICD-10-CM | POA: Diagnosis not present

## 2013-12-14 DIAGNOSIS — Z6826 Body mass index (BMI) 26.0-26.9, adult: Secondary | ICD-10-CM | POA: Diagnosis not present

## 2013-12-14 DIAGNOSIS — F3289 Other specified depressive episodes: Secondary | ICD-10-CM | POA: Diagnosis not present

## 2013-12-14 MED ORDER — DILTIAZEM HCL ER COATED BEADS 120 MG PO CP24: 120.0000 mg | ORAL_CAPSULE | Freq: Every day | ORAL | Status: DC

## 2013-12-14 NOTE — Progress Notes (Signed)
[name] Date of Birth: 1950/06/01 Medical Record #[id number]  History of Present Illness: Ms. Andrea Morgan is seen back today for a post hospital visit. Seen for Dr. Mare Ferrari. She is a 63 year old female with recent colon polyp that required colectomy - developed post op AF. Started on CCB and converted. Echo normal. This was a lone episode - not felt to need anticoagulation. CHADSVAS of 2 for age/sex. Could consider stress test/event monitor.  She has no previous cardiac issues.   Comes back today. Here alone. Doing ok. She is ok. Some "mental issues". Seeing her PCP later this morning to talk about an antidepressant. No chest pain. Not short of breath. No more "spells" which with talking with her - she noted that she felt like she was having a panic attack. In looking back she now notes that when she had her lung surgery 2 years ago - she was at home recuperating and had these same type of "panic attacks". She attributed this to the narcotics she was taking. She does not like taking the diltiazem BID.   Current Outpatient Prescriptions  Medication Sig Dispense Refill  . albuterol (PROVENTIL HFA;VENTOLIN HFA) 108 (90 BASE) MCG/ACT inhaler Inhale 1 puff into the lungs every 6 (six) hours as needed for wheezing or shortness of breath.      Marland Kitchen atorvastatin (LIPITOR) 20 MG tablet Take 20 mg by mouth daily. Takes at bedtime      . zolpidem (AMBIEN) 10 MG tablet Take 10 mg by mouth at bedtime.       Marland Kitchen diltiazem (CARDIZEM CD) 120 MG 24 hr capsule Take 1 capsule (120 mg total) by mouth daily.  90 capsule  3   No current facility-administered medications for this visit.    Allergies  Allergen Reactions  . Oxycodone     Itching     Pt states she does ok with hydrocodone  . Sulfa Antibiotics Hives    Past Medical History  Diagnosis Date  . Insomnia   . Chronic headaches   . Allergic rhinitis   . Depression   . Depression   . PONV (postoperative nausea and vomiting)   . Shortness of  breath     with exercsie  . GERD (gastroesophageal reflux disease)   . H/O hiatal hernia   . History of kidney stones   . Anemia   . COPD (chronic obstructive pulmonary disease)   . Emphysema of lung   . Hyperlipidemia   . Anxiety     experienced panic attacks at home after her last surgery  . Arthritis     arthritis left jaw / pain    . 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 Stiffness in joint     neck - s/p cervical fusion  . PAF (paroxysmal atrial fibrillation) 10/2013    Post-op Afib.    Past Surgical History  Procedure Laterality Date  . Appendectomy  1978  . Tonsillectomy  1971  . Cervical fusion  1997  . Video bronchoscopy  05/11/2011    Procedure: VIDEO BRONCHOSCOPY WITH FLUORO;  Surgeon: Collene Gobble, MD;  Location: [facility];  Service: Cardiopulmonary;  Laterality: Bilateral;  . Cervical fusion    . Esophagogastroduodenoscopy  05/21/2011    Procedure: ESOPHAGOGASTRODUODENOSCOPY (EGD);  Surgeon: Beryle Beams, MD;  Location: [facility];  Service: Endoscopy;  Laterality: N/A;  . Video bronchoscopy  06/02/2011    Procedure: VIDEO BRONCHOSCOPY;  Surgeon: Grace Isaac, MD;  Location: [facility];  Service: Thoracic;  Laterality: N/A;  . Mass excision  06/02/2011    Procedure: EXCISION MASS;  Surgeon: Grace Isaac, MD;  Location: [facility];  Service: Thoracic;;  . Laparoscopic partial colectomy N/A 11/08/2013    Procedure: laparoscopic partial right colectomy;  Surgeon: [name], MD;  Location: [facility];  Service: General;  Laterality: N/A;  . Examination under anesthesia N/A 11/08/2013    Procedure: ANAL Hawi;  Surgeon: [name], MD;  Location: [facility];  Service: General;  Laterality: N/A;  . Transthoracic echocardiogram  11/12/2013    Normal LV size & function, EF 55-60%.  No valvular lesions or WMA.    History  Smoking status  . Former Smoker -- 1.00  packs/day for 45 years  . Types: Cigarettes  . Quit date: 04/14/2011  Smokeless tobacco  . Never Used    History  Alcohol Use No    Comment: smokes pot every once in a while    Family History  Problem Relation Age of Onset  . Asthma Mother   . Heart disease Mother   . Asthma Maternal Grandmother   . Lung cancer Father   . Cancer Father   . Ovarian cancer Maternal Aunt   . Breast cancer Maternal Aunt     x2  . Prostate cancer Maternal Uncle   . Kidney cancer Sister   . Anesthesia problems Neg Hx   . Colon cancer Brother     Review of Systems: The review of systems is per the HPI.  All other systems were reviewed and are negative.  Physical Exam: BP 120/70  Pulse 73  Ht 5' 6.5" (1.689 m)  Wt 162 lb 12.8 oz (73.846 kg)  BMI 25.89 kg/m2 Patient is very pleasant and in no acute distress. Skin is warm and dry. Color is normal.  HEENT is unremarkable. Normocephalic/atraumatic. PERRL. Sclera are nonicteric. Neck is supple. No masses. No JVD. Lungs are clear. Cardiac exam shows a regular rate and rhythm. Abdomen is soft. Extremities are without edema. Gait and ROM are intact. No gross neurologic deficits noted.  Wt Readings from Last 3 Encounters:  12/14/13 162 lb 12.8 oz (73.846 kg)  11/26/13 157 lb (71.215 kg)  11/08/13 171 lb (77.565 kg)    LABORATORY DATA/PROCEDURES:  Lab Results  Component Value Date   WBC 7.5 11/13/2013   HGB 9.3* 11/13/2013   HCT 28.0* 11/13/2013   PLT 306 11/13/2013   GLUCOSE 101* 11/11/2013   ALT 15 06/04/2011   AST 18 06/04/2011   NA 138 11/11/2013   K 3.9 11/11/2013   CL 101 11/11/2013   CREATININE 0.68 11/11/2013   BUN 17 11/11/2013   CO2 26 11/11/2013   INR 0.98 11/11/2013   HGBA1C 5.7* 11/07/2013    BNP (last 3 results) No results found for this basename: PROBNP,  in the last 8760 hours   Echo Study Conclusions from July 2015  - Left ventricle: The cavity size was normal. Systolic function was normal. The estimated ejection fraction was  in the range of 55% to 60%. Wall motion was normal; there were no regional wall motion abnormalities.    Assessment / Plan: 1. PAF - post op after colon surgery - no recurrence - will change to Diltiazem 120 mg daily long acting. I do not think she needs further testing at this time unless she continues to have  episodes. She is advised to try and get an EKG if she feels like she is having a recurrence. She is not really wanting further testing done at this time. Will see her back in 3 months.   2. Post op colon surgery  3. Prior lung surgery by Dr. Servando Snare.  See back in 3 months. Changed to long acting form of CCB. Seeing her PCP later today.   Patient is agreeable to this plan and will call if any problems develop in the interim.   Burtis Junes, RN, [place] [address] [place], [place] [facility] [place]  [id number] [phone]

## 2013-12-14 NOTE — Patient Instructions (Addendum)
Stop the diltiazem twice a day regimen  Start the long acting form - diltiazem 120 mg once daily - this is at the drug store  See Dr. Mare Ferrari in 3 months  If you have any "panic attacks" please try to get an EKG done - this may be your irregular heart beat  Call the [place] office at [phone] if you have any questions, problems or concerns.

## 2014-01-15 DIAGNOSIS — Z6826 Body mass index (BMI) 26.0-26.9, adult: Secondary | ICD-10-CM | POA: Diagnosis not present

## 2014-01-15 DIAGNOSIS — Z23 Encounter for immunization: Secondary | ICD-10-CM | POA: Diagnosis not present

## 2014-01-15 DIAGNOSIS — G4700 Insomnia, unspecified: Secondary | ICD-10-CM | POA: Diagnosis not present

## 2014-01-15 DIAGNOSIS — F329 Major depressive disorder, single episode, unspecified: Secondary | ICD-10-CM | POA: Diagnosis not present

## 2014-01-18 DIAGNOSIS — S161XXA Strain of muscle, fascia and tendon at neck level, initial encounter: Secondary | ICD-10-CM | POA: Diagnosis not present

## 2014-01-18 DIAGNOSIS — S0083XA Contusion of other part of head, initial encounter: Secondary | ICD-10-CM | POA: Diagnosis not present

## 2014-01-18 DIAGNOSIS — S41101A Unspecified open wound of right upper arm, initial encounter: Secondary | ICD-10-CM | POA: Diagnosis not present

## 2014-03-11 ENCOUNTER — Ambulatory Visit (INDEPENDENT_AMBULATORY_CARE_PROVIDER_SITE_OTHER): Payer: Medicare Other | Admitting: Cardiology

## 2014-03-11 VITALS — BP 138/66 | HR 82 | Ht 66.0 in | Wt 169.0 lb

## 2014-03-11 DIAGNOSIS — E785 Hyperlipidemia, unspecified: Secondary | ICD-10-CM | POA: Insufficient documentation

## 2014-03-11 DIAGNOSIS — I480 Paroxysmal atrial fibrillation: Secondary | ICD-10-CM | POA: Diagnosis not present

## 2014-03-11 DIAGNOSIS — E7800 Pure hypercholesterolemia, unspecified: Secondary | ICD-10-CM

## 2014-03-11 DIAGNOSIS — I4891 Unspecified atrial fibrillation: Secondary | ICD-10-CM

## 2014-03-11 NOTE — Patient Instructions (Signed)
Your physician recommends that you continue on your current medications as directed. Please refer to the Current Medication list given to you today.  Your physician wants you to follow-up in: 6 month ov You will receive a reminder letter in the mail two months in advance. If you don't receive a letter, please call our office to schedule the follow-up appointment.  

## 2014-03-11 NOTE — Assessment & Plan Note (Signed)
The patient has a history of hypercholesterolemia.  Her PCP follows this.  She is not having any myalgias from the Lipitor

## 2014-03-11 NOTE — Progress Notes (Signed)
Andrea Morgan Date of Birth:  02-13-1951 [facility] [address] [place], [place]  [address] [phone]  Fax   [phone]  HPI: This pleasant 63 year old woman is seen for a follow-up office visit.  She has been in good general health.  In July 2015 she was hospitalized for colon surgery for a colon polyp.  Postoperatively she went into paroxysmal atrial fibrillation.  She returned to normal sinus rhythm in less than 24 hours on IV Cardizem.  She was discharged on Cardizem CD 120 mg daily.  Her echocardiogram done in the hospital showed normal left ventricular function.  This was an isolated episode of atrial fibrillation.  She has not had any recurrent atrial fibrillation.  She did have an episode of palpitations while watching the terrorist attacks in [place] on the TV last week.  The episode lasted less than 2 minutes and she attributes it to an acute anxiety reaction. Patient has not been expressing any chest pain or shortness of breath.  The patient has a history of hypercholesterolemia followed by Dr. Ardeth Morgan.  Current Outpatient Prescriptions  Medication Sig Dispense Refill  . albuterol (PROVENTIL HFA;VENTOLIN HFA) 108 (90 BASE) MCG/ACT inhaler Inhale 1 puff into the lungs every 6 (six) hours as needed for wheezing or shortness of breath.    Marland Kitchen atorvastatin (LIPITOR) 20 MG tablet Take 20 mg by mouth daily. Takes at bedtime    . diltiazem (CARDIZEM CD) 120 MG 24 hr capsule Take 1 capsule (120 mg total) by mouth daily. 90 capsule 3  . zolpidem (AMBIEN) 10 MG tablet Take 10 mg by mouth at bedtime.      No current facility-administered medications for this visit.    Allergies  Allergen Reactions  . Oxycodone     Itching     Pt states she does ok with hydrocodone  . Sulfa Antibiotics Hives    Patient Active Problem List   Diagnosis Date Noted  . Atrial fibrillation with RVR - post-operative  11/12/2013  . Personal history of colonic polyps 11/08/2013    . COPD (chronic obstructive pulmonary disease) 04/26/2013  . 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 Chest mass 05/04/2011    History  Smoking status  . Former Smoker -- 1.00 packs/day for 45 years  . Types: Cigarettes  . Quit date: 04/14/2011  Smokeless tobacco  . Never Used    History  Alcohol Use No    Comment: smokes pot every once in a while    Family History  Problem Relation Age of Onset  . Asthma Mother   . Heart disease Mother   . Asthma Maternal Grandmother   . Lung cancer Father   . Cancer Father   . Ovarian cancer Maternal Aunt   . Breast cancer Maternal Aunt     x2  . Prostate cancer Maternal Uncle   . Kidney cancer Sister   . Anesthesia problems Neg Hx   . Colon cancer Brother     Review of Systems: The patient denies any heat or cold intolerance.  No weight gain or weight loss.  The patient denies headaches or blurry vision.  There is no cough or sputum production.  The patient denies dizziness.  There is no hematuria or hematochezia.  The patient denies any muscle aches or arthritis.  The patient denies any rash.  The patient denies frequent falling or instability.  There is no history of depression or anxiety.  All other systems were reviewed and are negative.   Wt Readings from Last 3 Encounters:  03/11/14 169 lb (76.658 kg)  12/14/13 162 lb 12.8 oz (73.846 kg)  11/26/13 157 lb (71.215 kg)    Physical Exam: Filed Vitals:   03/11/14 1615  BP: 138/66  Pulse: 82  The patient appears to be in no distress.  Head and neck exam reveals that the pupils are equal and reactive.  The extraocular movements are full.  There is no scleral icterus.  Mouth and pharynx are benign.  No lymphadenopathy.  No carotid bruits.  The jugular venous pressure is normal.  Thyroid is not enlarged or tender.  Chest is clear to percussion and auscultation.  No rales or rhonchi.  Expansion of the chest is symmetrical.  Heart reveals no  abnormal lift or heave.  First and second heart sounds are normal.  There is no murmur gallop rub or click.  The abdomen is soft and nontender.  Bowel sounds are normoactive.  There is no hepatosplenomegaly or mass.  There are no abdominal bruits.  Extremities reveal no phlebitis or edema.  Pedal pulses are good.  There is no cyanosis or clubbing.  Neurologic exam is normal strength and no lateralizing weakness.  No sensory deficits.  Integument reveals no rash  EKG shows normal sinus rhythm and is within normal limits   Assessment / Plan: 1.  One episode of paroxysmal atrial fibrillation occurring in the postoperative setting in July 2015.  Not on long-term anticoagulation. 2.  Hypercholesterolemia  Disposition: Continue same medication.  Recheck in 6 months for office visit.

## 2014-03-11 NOTE — Assessment & Plan Note (Signed)
No documented recurrence of atrial fibrillation.  Continue same Cardizem CD 120 one daily.

## 2014-03-26 ENCOUNTER — Other Ambulatory Visit: Payer: Self-pay | Admitting: *Deleted

## 2014-03-26 DIAGNOSIS — C3431 Malignant neoplasm of lower lobe, right bronchus or lung: Secondary | ICD-10-CM

## 2014-04-03 DIAGNOSIS — E039 Hypothyroidism, unspecified: Secondary | ICD-10-CM | POA: Diagnosis not present

## 2014-04-03 DIAGNOSIS — E785 Hyperlipidemia, unspecified: Secondary | ICD-10-CM | POA: Diagnosis not present

## 2014-04-03 DIAGNOSIS — Z6827 Body mass index (BMI) 27.0-27.9, adult: Secondary | ICD-10-CM | POA: Diagnosis not present

## 2014-04-03 DIAGNOSIS — F329 Major depressive disorder, single episode, unspecified: Secondary | ICD-10-CM | POA: Diagnosis not present

## 2014-04-03 DIAGNOSIS — Z1389 Encounter for screening for other disorder: Secondary | ICD-10-CM | POA: Diagnosis not present

## 2014-04-03 DIAGNOSIS — Z79899 Other long term (current) drug therapy: Secondary | ICD-10-CM | POA: Diagnosis not present

## 2014-04-25 ENCOUNTER — Encounter: Payer: Medicare Other | Admitting: Cardiothoracic Surgery

## 2014-05-02 ENCOUNTER — Ambulatory Visit
Admission: RE | Admit: 2014-05-02 | Discharge: 2014-05-02 | Disposition: A | Discharge status: Home / Self Care | Payer: Medicare Other | Source: Ambulatory Visit | Attending: Cardiothoracic Surgery | Admitting: Cardiothoracic Surgery

## 2014-05-02 ENCOUNTER — Encounter: Payer: Medicare Other | Admitting: Cardiothoracic Surgery

## 2014-05-02 DIAGNOSIS — Z85118 Personal history of other malignant neoplasm of bronchus and lung: Secondary | ICD-10-CM | POA: Diagnosis not present

## 2014-05-02 DIAGNOSIS — C3431 Malignant neoplasm of lower lobe, right bronchus or lung: Secondary | ICD-10-CM

## 2014-05-02 DIAGNOSIS — D3502 Benign neoplasm of left adrenal gland: Secondary | ICD-10-CM | POA: Diagnosis not present

## 2014-05-02 DIAGNOSIS — I2510 Atherosclerotic heart disease of native coronary artery without angina pectoris: Secondary | ICD-10-CM | POA: Diagnosis not present

## 2014-05-02 DIAGNOSIS — J984 Other disorders of lung: Secondary | ICD-10-CM | POA: Diagnosis not present

## 2014-05-14 ENCOUNTER — Ambulatory Visit (INDEPENDENT_AMBULATORY_CARE_PROVIDER_SITE_OTHER): Payer: Medicare Other | Admitting: Cardiothoracic Surgery

## 2014-05-14 ENCOUNTER — Encounter: Payer: Self-pay | Admitting: Cardiothoracic Surgery

## 2014-05-14 VITALS — BP 127/66 | HR 72 | Resp 16 | Ht 66.0 in | Wt 169.0 lb

## 2014-05-14 DIAGNOSIS — C761 Malignant neoplasm of thorax: Secondary | ICD-10-CM | POA: Diagnosis not present

## 2014-05-14 DIAGNOSIS — Z9889 Other specified postprocedural states: Secondary | ICD-10-CM

## 2014-05-14 NOTE — Progress Notes (Signed)
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 She has been a long-term smoker but in April of this year has quit and remained off cigarettes since that time. In August she had a colon resection for colonic polyps. During that admission she had some ectopy was seen by cardiology in consultation and is now followed by Dr. Mare Ferrari.    History  Smoking status  . Former Smoker -- 1.00 packs/day for 45 years  . Types: Cigarettes  . Quit date: 04/14/2011  Smokeless tobacco  . Never Used       Allergies  Allergen Reactions  . Oxycodone     Itching     Pt states she does ok with hydrocodone  . Sulfa Antibiotics Hives    Current Outpatient Prescriptions  Medication Sig Dispense Refill  . albuterol (PROVENTIL HFA;VENTOLIN HFA) 108 (90 BASE) MCG/ACT inhaler Inhale 1 puff into the lungs every 6 (six) hours as needed for wheezing or shortness of breath.    Marland Kitchen atorvastatin (LIPITOR) 20 MG tablet Take 20 mg by mouth daily. Takes at bedtime    . diltiazem (CARDIZEM CD) 120 MG 24 hr capsule Take 1 capsule (120 mg total) by mouth daily. 90 capsule 3  . zolpidem (AMBIEN) 10 MG tablet Take 10 mg by mouth at bedtime.      No current facility-administered medications for this visit.       Physical Exam: Ht 5\' 6"  (1.676 m)  Wt 169 lb (76.658 kg)  BMI 27.29 kg/m2  General appearance: alert and  cooperative Neurologic: intact Heart: regular rate and rhythm, S1, S2 normal, no murmur, click, rub or gallop Lungs: clear to auscultation bilaterally Abdomen: soft, non-tender; bowel sounds normal; no masses,  no organomegaly Extremities: extremities normal, atraumatic, no cyanosis or edema and Homans sign is negative, no sign of DVT Wounds:healing well No cervical  Or  axillary adenopathy She has palpable DP and PT pulses and radial pulses bilaterally  Diagnostic Studies & Laboratory data:         Recent Radiology Findings: Ct Chest Wo Contrast  05/02/2014   CLINICAL DATA:  History of right lower lobe mass resection. Ex-smoker. No current complaints.  EXAM: CT CHEST WITHOUT CONTRAST  TECHNIQUE: Multidetector CT imaging of the chest was performed following the standard protocol without IV contrast.  COMPARISON:  Chest radiograph of 11/11/2013 and CT of 04/26/2013.  FINDINGS: Mediastinum/Nodes: No supraclavicular adenopathy. Aortic atherosclerosis. Normal heart size, without pericardial effusion. LAD coronary artery atherosclerosis. No mediastinal or definite hilar adenopathy, given limitations of unenhanced CT. Small hiatal hernia.  Lungs/Pleura: Presumed secretions within the dependent trachea. Moderate centrilobular emphysema.  Similar volume loss and scarring at the anterior right lung base. No new nodules or airspace opacities.  No pleural fluid.  Upper abdomen: Subcapsular right hepatic lobe low-density focus on image 71 is similar to 03/09/2012 and likely benign. Normal imaged portions of the spleen, pancreas, gallbladder, biliary tract, adrenal glands. Mildly scarred left kidney. Left adrenal adenoma which is unchanged at approximately 2.6 cm.  Musculoskeletal: No acute osseous abnormality. Lower cervical spine fusion may be developmental or postsurgical. C6-7.  IMPRESSION: 1. Volume loss and scarring at the right lung base. No evidence of recurrent or metastatic disease. 2. Atherosclerosis,  Age advanced coronary artery atherosclerosis. Recommend assessment of coronary risk factors and consideration of medical therapy. 3. Left Adrenal adenoma.   Electronically Signed   By: Abigail Miyamoto M.D.   On: 05/02/2014 15:02  I have independently reviewed the above radiology studies  and reviewed the findings with the patient.  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 Microscopic Comment The tumor is composed of relatively uniform ovoid to spindle cells demonstrating a haphazard morphologic pattern. Within the tumor, there is prominent vasculature that in some areas demonstrates a staghorn configuration. There is abundant hyalinization with focal calcification and patchy areas of necrosis present. Although the nuclei are relatively monomorphic, there are patchy areas of significant nuclear atypia. Mitotic activity is variable with up to 3 mitoses per high-power field. 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 The tumor is relatively large (greater than 10cm), is focally necrotic, exhibits cytologic atypia and more than an occasional mitotic figure is identified. Although there is no anaplastic cytologic features, significantly increased mitotic activity, or true sarcomatous differentiation, the morphologic findings are atypical for the "usual" solitary fibrous tumor. Some studies have shown that even with atypical or frankly malignant features, approximately 50% of those tumors actually pursued an aggressive clinical course. Regardless, histopathology is not discriminatory to predict clinical behavior and as such, all pleuropulmonary solitary fibrous tumors, atypical or otherwise, should be consider to be tumors of uncertain malignant potential. The case was reviewed with Dr. Lyndon Code who concurs. (CR:mw 07-02-11)  Assessment / Plan:   The patient was congratulated on not smoking  We discussed the findings on CT scan of COPD/emphysema/cad No evidence of recurrent or metastatic disease on CT scan Atherosclerosis, Age advanced coronary artery atherosclerosis by CT. Patient has had assessment of coronary risk factors and consideration of medical therapy by cardiology Left Adrenal adenoma.  I'll plan to see her back in 1 year with a followup CT of the chest.  Grace Isaac MD 05/14/2014 3:45 PM

## 2014-07-24 ENCOUNTER — Encounter: Payer: Self-pay | Admitting: *Deleted

## 2014-08-26 ENCOUNTER — Encounter: Payer: Self-pay | Admitting: Internal Medicine

## 2014-08-27 ENCOUNTER — Observation Stay (HOSPITAL_COMMUNITY)
Admission: EM | Admit: 2014-08-27 | Discharge: 2014-08-28 | Disposition: A | Discharge status: Home / Self Care | Payer: Medicare Other | Attending: Family Medicine | Admitting: Family Medicine

## 2014-08-27 ENCOUNTER — Emergency Department (HOSPITAL_COMMUNITY): Payer: Medicare Other

## 2014-08-27 ENCOUNTER — Encounter (HOSPITAL_COMMUNITY): Payer: Self-pay | Admitting: *Deleted

## 2014-08-27 DIAGNOSIS — G8929 Other chronic pain: Secondary | ICD-10-CM | POA: Insufficient documentation

## 2014-08-27 DIAGNOSIS — J449 Chronic obstructive pulmonary disease, unspecified: Secondary | ICD-10-CM | POA: Diagnosis not present

## 2014-08-27 DIAGNOSIS — R51 Headache: Secondary | ICD-10-CM | POA: Insufficient documentation

## 2014-08-27 DIAGNOSIS — Z87442 Personal history of urinary calculi: Secondary | ICD-10-CM | POA: Insufficient documentation

## 2014-08-27 DIAGNOSIS — Z886 Allergy status to analgesic agent status: Secondary | ICD-10-CM | POA: Insufficient documentation

## 2014-08-27 DIAGNOSIS — R0602 Shortness of breath: Secondary | ICD-10-CM | POA: Diagnosis not present

## 2014-08-27 DIAGNOSIS — E7800 Pure hypercholesterolemia, unspecified: Secondary | ICD-10-CM | POA: Diagnosis present

## 2014-08-27 DIAGNOSIS — Z87891 Personal history of nicotine dependence: Secondary | ICD-10-CM | POA: Insufficient documentation

## 2014-08-27 DIAGNOSIS — R079 Chest pain, unspecified: Secondary | ICD-10-CM | POA: Diagnosis present

## 2014-08-27 DIAGNOSIS — F329 Major depressive disorder, single episode, unspecified: Secondary | ICD-10-CM | POA: Diagnosis not present

## 2014-08-27 DIAGNOSIS — K219 Gastro-esophageal reflux disease without esophagitis: Secondary | ICD-10-CM | POA: Diagnosis not present

## 2014-08-27 DIAGNOSIS — Z7982 Long term (current) use of aspirin: Secondary | ICD-10-CM | POA: Diagnosis not present

## 2014-08-27 DIAGNOSIS — R0789 Other chest pain: Secondary | ICD-10-CM | POA: Diagnosis not present

## 2014-08-27 DIAGNOSIS — R072 Precordial pain: Secondary | ICD-10-CM | POA: Diagnosis not present

## 2014-08-27 DIAGNOSIS — E785 Hyperlipidemia, unspecified: Secondary | ICD-10-CM | POA: Diagnosis not present

## 2014-08-27 DIAGNOSIS — Z882 Allergy status to sulfonamides status: Secondary | ICD-10-CM | POA: Insufficient documentation

## 2014-08-27 DIAGNOSIS — F419 Anxiety disorder, unspecified: Secondary | ICD-10-CM | POA: Diagnosis present

## 2014-08-27 DIAGNOSIS — I480 Paroxysmal atrial fibrillation: Secondary | ICD-10-CM | POA: Diagnosis not present

## 2014-08-27 DIAGNOSIS — R42 Dizziness and giddiness: Secondary | ICD-10-CM | POA: Diagnosis not present

## 2014-08-27 DIAGNOSIS — Z902 Acquired absence of lung [part of]: Secondary | ICD-10-CM | POA: Insufficient documentation

## 2014-08-27 DIAGNOSIS — Z85118 Personal history of other malignant neoplasm of bronchus and lung: Secondary | ICD-10-CM | POA: Diagnosis not present

## 2014-08-27 DIAGNOSIS — Z85038 Personal history of other malignant neoplasm of large intestine: Secondary | ICD-10-CM | POA: Diagnosis not present

## 2014-08-27 DIAGNOSIS — R519 Headache, unspecified: Secondary | ICD-10-CM

## 2014-08-27 LAB — CBC
HCT: 33.9 % — ABNORMAL LOW (ref 36.0–46.0)
Hemoglobin: 11 g/dL — ABNORMAL LOW (ref 12.0–15.0)
MCH: 24.8 pg — ABNORMAL LOW (ref 26.0–34.0)
MCHC: 32.4 g/dL (ref 30.0–36.0)
MCV: 76.5 fL — ABNORMAL LOW (ref 78.0–100.0)
PLATELETS: 294 10*3/uL (ref 150–400)
RBC: 4.43 MIL/uL (ref 3.87–5.11)
RDW: 18.8 % — AB (ref 11.5–15.5)
WBC: 7.1 10*3/uL (ref 4.0–10.5)

## 2014-08-27 LAB — BASIC METABOLIC PANEL
ANION GAP: 10 (ref 5–15)
BUN: 15 mg/dL (ref 6–20)
CHLORIDE: 105 mmol/L (ref 101–111)
CO2: 21 mmol/L — ABNORMAL LOW (ref 22–32)
Calcium: 8.8 mg/dL — ABNORMAL LOW (ref 8.9–10.3)
Creatinine, Ser: 0.69 mg/dL (ref 0.44–1.00)
GFR calc non Af Amer: >60 mL/min (ref >60)
Glucose, Bld: 125 mg/dL — ABNORMAL HIGH (ref 70–99)
POTASSIUM: 3.5 mmol/L (ref 3.5–5.1)
Sodium: 136 mmol/L (ref 135–145)

## 2014-08-27 LAB — I-STAT TROPONIN, ED: Troponin i, poc: 0 ng/mL (ref 0.00–0.08)

## 2014-08-27 LAB — TROPONIN I: Troponin I: <0.03 ng/mL

## 2014-08-27 NOTE — ED Provider Notes (Signed)
Complaint of left anterior chest pain onset at rest, radiating to left arm onset 9 PM tonight. Pain was much improved after treatment with 2 sublingual nitroglycerin by EMS. Patient had taken 4 baby aspirins prior to calling 911. She is presently asymptomatic. On exam alert no distress lungs clear auscultation heart regular rate and rhythm no murmurs abdomen nondistended nontender extremities without edema. Heart score equals 5 based on history, age and risk factors being family history, hypercholesterolemia and ex smoker  Orlie Dakin, MD 08/27/14 2352

## 2014-08-27 NOTE — ED Notes (Signed)
Patient transported to X-ray 

## 2014-08-27 NOTE — ED Provider Notes (Signed)
CSN: [id number]     Arrival date & time 08/27/14  2222 History   First MD Initiated Contact with Patient 08/27/14 2223     Chief Complaint  Patient presents with  . Chest Pain    (Consider location/radiation/quality/duration/timing/severity/associated sxs/prior Treatment) HPI Comments: 64 year old female with a history of esophageal reflux, COPD, emphysema, right lower lobe lung cancer status post lobectomy, postoperative atrial fibrillation, and anxiety presents to the emergency department for further evaluation of chest pain. Patient states that chest pain began approximately 2 hours ago while at rest, sitting in her recliner. She describes the pain as a constant ache which worsened over time and was relieved with sublingual nitroglycerin 2. Patient took 324 mg aspirin prior to arrival as well as a tablet of diltiazem without improvement in her chest pain. She reports associated SOB and lightheadedness. She denies fever, syncope, diaphoresis, N/V, and leg swelling. Patient with no personal hx of HTN or DM; she has a hx of HLD for which she takes Lipitor with good control of her dyslipidemia. She reports a hx of ACS in her mother in her 57's and maternal uncle in his 28's. She has never had a stress test in the past. She is chest pain-free at this time.  Patient is a 64 y.o. female presenting with chest pain. The history is provided by the patient. No language interpreter was used.  Chest Pain Associated symptoms: shortness of breath     Past Medical History  Diagnosis Date  . Insomnia   . Chronic headaches   . Allergic rhinitis   . Depression   . PONV (postoperative nausea and vomiting)   . Shortness of breath     with exercsie  . GERD (gastroesophageal reflux disease)   . H/O hiatal hernia   . History of kidney stones   . Anemia   . COPD (chronic obstructive pulmonary disease)   . Emphysema of lung   . Hyperlipidemia   . Anxiety     experienced panic attacks at home after her  last surgery  . Arthritis     arthritis left jaw / pain    . 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 Stiffness in joint     neck - s/p cervical fusion  . PAF (paroxysmal atrial fibrillation) 10/2013    Post-op Afib.   Past Surgical History  Procedure Laterality Date  . Appendectomy  1978  . Tonsillectomy  1971  . Cervical fusion  1997  . Video bronchoscopy  05/11/2011    Procedure: VIDEO BRONCHOSCOPY WITH FLUORO;  Surgeon: Collene Gobble, MD;  Location: [facility];  Service: Cardiopulmonary;  Laterality: Bilateral;  . Cervical fusion    . Esophagogastroduodenoscopy  05/21/2011    Procedure: ESOPHAGOGASTRODUODENOSCOPY (EGD);  Surgeon: Beryle Beams, MD;  Location: [facility];  Service: Endoscopy;  Laterality: N/A;  . Video bronchoscopy  06/02/2011    Procedure: VIDEO BRONCHOSCOPY;  Surgeon: Grace Isaac, MD;  Location: [place];  Service: Thoracic;  Laterality: N/A;  . Mass excision  06/02/2011    Procedure: EXCISION MASS;  Surgeon: Grace Isaac, MD;  Location: [place];  Service: Thoracic;;  . Laparoscopic partial colectomy N/A 11/08/2013    Procedure: laparoscopic partial right colectomy;  Surgeon: [name], MD;  Location: [facility];  Service: General;  Laterality: N/A;  . Examination under anesthesia N/A 11/08/2013    Procedure: ANAL  EXAM UNDER ANESTHESIA WITH BIOPSY;  Surgeon: [name], MD;  Location: [facility];  Service: General;  Laterality: N/A;  . Transthoracic echocardiogram  11/12/2013    Normal LV size & function, EF 55-60%.  No valvular lesions or WMA.   Family History  Problem Relation Age of Onset  . Asthma Mother   . Heart disease Mother   . Asthma Maternal Grandmother   . Lung cancer Father   . Cancer Father   . Ovarian cancer Maternal Aunt   . Breast cancer Maternal Aunt     x2  . Prostate cancer Maternal Uncle   . Kidney cancer Sister   . Anesthesia problems Neg Hx   . Colon  cancer Brother    History  Substance Use Topics  . Smoking status: Former Smoker -- 1.00 packs/day for 45 years    Types: Cigarettes    Quit date: 04/14/2011  . Smokeless tobacco: Never Used  . Alcohol Use: No     Comment: smokes pot every once in a while   OB History    No data available      Review of Systems  Respiratory: Positive for shortness of breath.   Cardiovascular: Positive for chest pain.  Neurological: Positive for light-headedness.  All other systems reviewed and are negative.   Allergies  Oxycodone and Sulfa antibiotics  Home Medications   Prior to Admission medications   Medication Sig Start Date End Date Taking? Authorizing Provider  albuterol (PROVENTIL HFA;VENTOLIN HFA) 108 (90 BASE) MCG/ACT inhaler Inhale 1 puff into the lungs every 6 (six) hours as needed for wheezing or shortness of breath.   Yes Historical Provider, MD  aspirin EC 81 MG tablet Take 81 mg by mouth daily.   Yes Historical Provider, MD  atorvastatin (LIPITOR) 20 MG tablet Take 20 mg by mouth daily. Takes at bedtime   Yes Historical Provider, MD  diltiazem (CARDIZEM CD) 120 MG 24 hr capsule Take 120 mg by mouth daily.   Yes Historical Provider, MD  escitalopram (LEXAPRO) 10 MG tablet Take 10 mg by mouth daily.   Yes Historical Provider, MD  famotidine (PEPCID) 20 MG tablet Take 20 mg by mouth daily.   Yes Historical Provider, MD  LORazepam (ATIVAN) 0.5 MG tablet Take 0.5 mg by mouth every 8 (eight) hours as needed for anxiety.   Yes Historical Provider, MD  zolpidem (AMBIEN) 10 MG tablet Take 10 mg by mouth at bedtime.    Yes Historical Provider, MD   BP 115/47 mmHg  Pulse 63  Temp(Src) 98.2 F (36.8 C) (Oral)  Resp 12  SpO2 99%   Physical Exam  Constitutional: She is oriented to person, place, and time. She appears well-developed and well-nourished. No distress.  Nontoxic/nonseptic appearing  HENT:  Head: Normocephalic and atraumatic.  Eyes: Conjunctivae and EOM are normal. No  scleral icterus.  Neck: Normal range of motion.  Cardiovascular: Normal rate, regular rhythm and intact distal pulses.   Pulmonary/Chest: Effort normal and breath sounds normal. No respiratory distress. She has no wheezes. She has no rales.  Lungs clear bilaterally. Chest expansion symmetric.  Musculoskeletal: Normal range of motion.  Neurological: She is alert and oriented to person, place, and time. She exhibits normal muscle tone. Coordination normal.  GCS 15. Speech is goal oriented. Patient moves extremities without ataxia.  Skin: Skin is warm and dry. No rash noted. She is not diaphoretic. No erythema. No pallor.  Psychiatric: She has a normal mood and affect. Her behavior is normal.  Nursing note and vitals reviewed.   ED Course  Procedures (including critical care time) Labs Review Labs Reviewed  CBC - Abnormal; Notable for the following:    Hemoglobin 11.0 (*)    HCT 33.9 (*)    MCV 76.5 (*)    MCH 24.8 (*)    RDW 18.8 (*)    All other components within normal limits  BASIC METABOLIC PANEL - Abnormal; Notable for the following:    CO2 21 (*)    Glucose, Bld 125 (*)    Calcium 8.8 (*)    All other components within normal limits  TROPONIN I  Randolm Idol, ED    Imaging Review Dg Chest 2 View  08/27/2014   CLINICAL DATA:  LEFT-sided chest pain. LEFT arm numbness today. History of lung cancer. History of colon cancer.  EXAM: CHEST  2 VIEW  COMPARISON:  05/02/2014.  FINDINGS: Cardiopericardial silhouette within normal limits. Mediastinal contours appear normal. Curvilinear density at the RIGHT lung base is compatible with scarring seen on prior chest CT. There is no airspace disease. No pleural effusion.  IMPRESSION: Chronic RIGHT basilar scarring without acute cardiopulmonary disease.   Electronically Signed   By: Dereck Ligas M.D.   On: 08/27/2014 22:57     EKG Interpretation   Date/Time:  Tuesday Aug 27 2014 22:35:51 EDT Ventricular Rate:  74 PR Interval:   166 QRS Duration: 76 QT Interval:  413 QTC Calculation: 458 R Axis:   29 Text Interpretation:  Sinus rhythm Ventricular premature complex No  significant change since last tracing Confirmed by Winfred Leeds  MD, SAM  [phone]) on 08/27/2014 11:35:38 PM      MDM   Final diagnoses:  Chest pain, unspecified chest pain type    64 year old female presents to the emergency department for further evaluation of substernal chest pain radiating to her left arm with associated left arm paresthesias and numbness in the tips of the fingers of her left hand. Patient reports associated shortness of breath as well as lightheadedness. No diaphoresis, syncope, or leg swelling. Patient has a reassuring workup today including a nonischemic EKG and negative troponin. She does, however, have a heart score 5. I believe she would benefit from admission for this for ACS r/o. She is chest pain free at present, since receiving SL NTG x 2. Dr. Shanon Brow of [facility] to admit. Temp admit orders placed.   Filed Vitals:   08/27/14 2232 08/27/14 2300 08/27/14 2340  BP: 144/59 116/47 115/47  Pulse: 77 62 63  Temp: 98.2 F (36.8 C)    TempSrc: Oral    Resp: '19 22 12  '$ SpO2: 97% 97% 99%     Antonietta Breach, PA-C 08/28/14 0010  Orlie Dakin, MD 08/28/14 0010

## 2014-08-27 NOTE — ED Notes (Signed)
EMS-pt reports chest pain that started approx 2 hours prior to calling EMS, described as a throbbing radiating up into left chest and down left arm associated with sob. Patient rated pain 6/10, 1 nitro down to 4/10, 1 more nitro given en route reducing pain to 1/10. Pt is complaining of headache at this time. Pt took 324 asa. 18g(L)AC.

## 2014-08-28 ENCOUNTER — Observation Stay (HOSPITAL_COMMUNITY): Payer: Medicare Other

## 2014-08-28 ENCOUNTER — Encounter (HOSPITAL_COMMUNITY): Payer: Self-pay | Admitting: Urology

## 2014-08-28 DIAGNOSIS — J438 Other emphysema: Secondary | ICD-10-CM

## 2014-08-28 DIAGNOSIS — I480 Paroxysmal atrial fibrillation: Secondary | ICD-10-CM

## 2014-08-28 DIAGNOSIS — R51 Headache: Secondary | ICD-10-CM

## 2014-08-28 DIAGNOSIS — R079 Chest pain, unspecified: Secondary | ICD-10-CM

## 2014-08-28 DIAGNOSIS — J449 Chronic obstructive pulmonary disease, unspecified: Secondary | ICD-10-CM | POA: Diagnosis not present

## 2014-08-28 DIAGNOSIS — G8929 Other chronic pain: Secondary | ICD-10-CM | POA: Diagnosis present

## 2014-08-28 DIAGNOSIS — R0789 Other chest pain: Secondary | ICD-10-CM

## 2014-08-28 DIAGNOSIS — F419 Anxiety disorder, unspecified: Secondary | ICD-10-CM | POA: Diagnosis present

## 2014-08-28 DIAGNOSIS — E780 Pure hypercholesterolemia: Secondary | ICD-10-CM

## 2014-08-28 DIAGNOSIS — R519 Headache, unspecified: Secondary | ICD-10-CM | POA: Insufficient documentation

## 2014-08-28 HISTORY — PX: TRANSTHORACIC ECHOCARDIOGRAM: SHX275

## 2014-08-28 LAB — TROPONIN I: Troponin I: <0.03 ng/mL (ref <0.031)

## 2014-08-28 MED ORDER — ALBUTEROL SULFATE (2.5 MG/3ML) 0.083% IN NEBU
3.0000 mL | INHALATION_SOLUTION | Freq: Four times a day (QID) | RESPIRATORY_TRACT | Status: DC | PRN
Start: 2014-08-28 — End: 2014-08-28

## 2014-08-28 MED ORDER — LORAZEPAM 0.5 MG PO TABS
0.5000 mg | ORAL_TABLET | Freq: Three times a day (TID) | ORAL | Status: DC | PRN
  Administered 2014-08-28: 0.5 mg via ORAL
  Filled 2014-08-28: qty 1

## 2014-08-28 MED ORDER — ZOLPIDEM TARTRATE 5 MG PO TABS
5.0000 mg | ORAL_TABLET | Freq: Every day | ORAL | Status: DC
  Administered 2014-08-28: 5 mg via ORAL
  Filled 2014-08-28: qty 1

## 2014-08-28 MED ORDER — ONDANSETRON HCL 4 MG/2ML IJ SOLN: 4.0000 mg | Freq: Four times a day (QID) | INTRAMUSCULAR | Status: DC | PRN

## 2014-08-28 MED ORDER — ESCITALOPRAM OXALATE 10 MG PO TABS
10.0000 mg | ORAL_TABLET | Freq: Every day | ORAL | Status: DC
  Administered 2014-08-28: 10 mg via ORAL
  Filled 2014-08-28: qty 1

## 2014-08-28 MED ORDER — GI COCKTAIL ~~LOC~~
30.0000 mL | Freq: Four times a day (QID) | ORAL | Status: DC | PRN
  Filled 2014-08-28: qty 30

## 2014-08-28 MED ORDER — ATORVASTATIN CALCIUM 20 MG PO TABS
20.0000 mg | ORAL_TABLET | Freq: Every day | ORAL | Status: DC
  Administered 2014-08-28: 20 mg via ORAL
  Filled 2014-08-28 (×2): qty 1

## 2014-08-28 MED ORDER — ASPIRIN EC 81 MG PO TBEC
81.0000 mg | DELAYED_RELEASE_TABLET | Freq: Every day | ORAL | Status: DC
  Administered 2014-08-28: 81 mg via ORAL
  Filled 2014-08-28: qty 1

## 2014-08-28 MED ORDER — ACETAMINOPHEN 325 MG PO TABS
650.0000 mg | ORAL_TABLET | ORAL | Status: DC | PRN
Start: 2014-08-28 — End: 2014-08-28

## 2014-08-28 MED ORDER — DILTIAZEM HCL ER COATED BEADS 120 MG PO CP24
120.0000 mg | ORAL_CAPSULE | Freq: Every day | ORAL | Status: DC
  Administered 2014-08-28: 120 mg via ORAL
  Filled 2014-08-28: qty 1

## 2014-08-28 MED ORDER — FAMOTIDINE 20 MG PO TABS
20.0000 mg | ORAL_TABLET | Freq: Every day | ORAL | Status: DC
  Administered 2014-08-28: 20 mg via ORAL
  Filled 2014-08-28: qty 1

## 2014-08-28 NOTE — Progress Notes (Signed)
  Echocardiogram 2D Echocardiogram has been performed.  Nahom Carfagno, [place] 08/28/2014, 11:45 AM

## 2014-08-28 NOTE — Discharge Summary (Signed)
Physician Discharge Summary  Andrea Morgan DTO:[id number] DOB: [birth year] DOA: 08/27/2014  PCP: [name], MD  Admit date: 08/27/2014 Discharge date: 08/28/2014  Time spent: > 35 minutes  Recommendations for Outpatient Follow-up:  1. Please consider adjusting patient's reflux medication given her recent presentation to the hospital complaining of atypical chest pain  Discharge Diagnoses:  Principal Problem:   Chest pain Active Problems:   COPD (chronic obstructive pulmonary disease)   Hypercholesterolemia   Chronic headaches   PAF (paroxysmal atrial fibrillation)   Anxiety   Discharge Condition: Stable  Diet recommendation: Heart healthy  Filed Weights   08/28/14 0119  Weight: 73.483 kg (162 lb)    History of present illness:  64 year old with history of GERD and panic attacks who presented complaining of chest discomfort.  Hospital Course:  Chest pain -Troponins 3 negative. Echocardiogram reporting normal EF of 55-60% % with no regional wall motion abnormalities. - At this juncture suspect reflux may have caused the chest discomfort  Procedures:  Echocardiogram EF 55-60%  Consultations:  None  Discharge Exam: Filed Vitals:   08/28/14 1433  BP: 130/59  Pulse: 66  Temp: 98.5 F (36.9 C)  Resp: 16    General: Patient in no acute distress, alert and awake Cardiovascular: Regular rate and rhythm, no rubs Respiratory: Clear to auscultation bilaterally, no wheezes  Discharge Instructions   Discharge Instructions    Call MD for:  difficulty breathing, headache or visual disturbances    Complete by:  As directed      Call MD for:  temperature >100.4    Complete by:  As directed      Diet - low sodium heart healthy    Complete by:  As directed      Discharge instructions    Complete by:  As directed   Please follow up with primary care physician in 1-2 weeks or sooner should any new concerns arise.     Increase activity slowly    Complete by:   As directed           Current Discharge Medication List    CONTINUE these medications which have NOT CHANGED   Details  albuterol (PROVENTIL HFA;VENTOLIN HFA) 108 (90 BASE) MCG/ACT inhaler Inhale 1 puff into the lungs every 6 (six) hours as needed for wheezing or shortness of breath.    aspirin EC 81 MG tablet Take 81 mg by mouth daily.    atorvastatin (LIPITOR) 20 MG tablet Take 20 mg by mouth daily. Takes at bedtime    diltiazem (CARDIZEM CD) 120 MG 24 hr capsule Take 120 mg by mouth daily.    escitalopram (LEXAPRO) 10 MG tablet Take 10 mg by mouth daily.    famotidine (PEPCID) 20 MG tablet Take 20 mg by mouth daily.    LORazepam (ATIVAN) 0.5 MG tablet Take 0.5 mg by mouth every 8 (eight) hours as needed for anxiety.    zolpidem (AMBIEN) 10 MG tablet Take 10 mg by mouth at bedtime.        Allergies  Allergen Reactions  . Oxycodone     Itching     Pt states she does ok with hydrocodone  . Sulfa Antibiotics Hives      The results of significant diagnostics from this hospitalization (including imaging, microbiology, ancillary and laboratory) are listed below for reference.    Significant Diagnostic Studies: Dg Chest 2 View  08/27/2014   CLINICAL DATA:  LEFT-sided chest pain. LEFT arm numbness today. History of lung cancer. History  of colon cancer.  EXAM: CHEST  2 VIEW  COMPARISON:  05/02/2014.  FINDINGS: Cardiopericardial silhouette within normal limits. Mediastinal contours appear normal. Curvilinear density at the RIGHT lung base is compatible with scarring seen on prior chest CT. There is no airspace disease. No pleural effusion.  IMPRESSION: Chronic RIGHT basilar scarring without acute cardiopulmonary disease.   Electronically Signed   By: Dereck Ligas M.D.   On: 08/27/2014 22:57    Microbiology: No results found for this or any previous visit (from the past 240 hour(s)).   Labs: Basic Metabolic Panel:  Recent Labs Lab 08/27/14 2235  NA 136  K 3.5  CL 105   CO2 21*  GLUCOSE 125*  BUN 15  CREATININE 0.69  CALCIUM 8.8*   Liver Function Tests: No results for input(s): AST, ALT, ALKPHOS, BILITOT, PROT, ALBUMIN in the last 168 hours. No results for input(s): LIPASE, AMYLASE in the last 168 hours. No results for input(s): AMMONIA in the last 168 hours. CBC:  Recent Labs Lab 08/27/14 2235  WBC 7.1  HGB 11.0*  HCT 33.9*  MCV 76.5*  PLT 294   Cardiac Enzymes:  Recent Labs Lab 08/27/14 2235 08/28/14 0147 08/28/14 0700 08/28/14 1255  TROPONINI <0.03 <0.03 <0.03 <0.03   BNP: BNP (last 3 results) No results for input(s): BNP in the last 8760 hours.  ProBNP (last 3 results) No results for input(s): PROBNP in the last 8760 hours.  CBG: No results for input(s): GLUCAP in the last 168 hours.     Signed:  Velvet Bathe  [facility] 08/28/2014, 3:20 PM

## 2014-08-28 NOTE — H&P (Signed)
PCP:   [name], MD   Chief Complaint:  Chest pain  HPI: 64 yo female with h/o anxiety, copd, headaches, hld, PAF, gerd comes in with several hours of substernal chest pain today with associated nausea and sob that radiated down her left arm with some tingling in her left fingers.  The pain persisted for about 2 hours and was not relieved by anything, she called EMS.  EMS arrived gave her ntg sublinqual tablets and after 2 of these the pain was relieved.  Pt denies any le edema or swelling or pain.  Has never had pain like this before.  Never had any cardiac w/u in the past.  She did receive aspirin with EMS.  This was nothing like her typical GERD.  She is currently chest pain free since arrival by ambulance in the ED.  No recent illnesses.  Pain came on at rest.  Review of Systems:  Positive and negative as per HPI otherwise all other systems are negative  Past Medical History: Past Medical History  Diagnosis Date  . Insomnia   . Chronic headaches   . Allergic rhinitis   . Depression   . PONV (postoperative nausea and vomiting)   . Shortness of breath     with exercsie  . GERD (gastroesophageal reflux disease)   . H/O hiatal hernia   . History of kidney stones   . Anemia   . COPD (chronic obstructive pulmonary disease)   . Emphysema of lung   . Hyperlipidemia   . Anxiety     experienced panic attacks at home after her last surgery  . Arthritis     arthritis left jaw / pain    . 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 Stiffness in joint     neck - s/p cervical fusion  . PAF (paroxysmal atrial fibrillation) 10/2013    Post-op Afib.   Past Surgical History  Procedure Laterality Date  . Appendectomy  1978  . Tonsillectomy  1971  . Cervical fusion  1997  . Video bronchoscopy  05/11/2011    Procedure: VIDEO BRONCHOSCOPY WITH FLUORO;  Surgeon: Collene Gobble, MD;  Location: [facility];  Service:  Cardiopulmonary;  Laterality: Bilateral;  . Cervical fusion    . Esophagogastroduodenoscopy  05/21/2011    Procedure: ESOPHAGOGASTRODUODENOSCOPY (EGD);  Surgeon: Beryle Beams, MD;  Location: [facility];  Service: Endoscopy;  Laterality: N/A;  . Video bronchoscopy  06/02/2011    Procedure: VIDEO BRONCHOSCOPY;  Surgeon: Grace Isaac, MD;  Location: [place];  Service: Thoracic;  Laterality: N/A;  . Mass excision  06/02/2011    Procedure: EXCISION MASS;  Surgeon: Grace Isaac, MD;  Location: [place];  Service: Thoracic;;  . Laparoscopic partial colectomy N/A 11/08/2013    Procedure: laparoscopic partial right colectomy;  Surgeon: [name], MD;  Location: [facility];  Service: General;  Laterality: N/A;  . Examination under anesthesia N/A 11/08/2013    Procedure: ANAL [place];  Surgeon: [name], MD;  Location: [facility];  Service: General;  Laterality: N/A;  . Transthoracic echocardiogram  11/12/2013    Normal LV size & function, EF 55-60%.  No valvular lesions or WMA.    Medications: Prior to Admission medications   Medication Sig Start Date End Date Taking? Authorizing Provider  albuterol (PROVENTIL HFA;VENTOLIN HFA) 108 (90 BASE) MCG/ACT inhaler Inhale 1 puff  into the lungs every 6 (six) hours as needed for wheezing or shortness of breath.   Yes Historical Provider, MD  aspirin EC 81 MG tablet Take 81 mg by mouth daily.   Yes Historical Provider, MD  atorvastatin (LIPITOR) 20 MG tablet Take 20 mg by mouth daily. Takes at bedtime   Yes Historical Provider, MD  diltiazem (CARDIZEM CD) 120 MG 24 hr capsule Take 120 mg by mouth daily.   Yes Historical Provider, MD  escitalopram (LEXAPRO) 10 MG tablet Take 10 mg by mouth daily.   Yes Historical Provider, MD  famotidine (PEPCID) 20 MG tablet Take 20 mg by mouth daily.   Yes Historical Provider, MD  LORazepam (ATIVAN) 0.5 MG tablet Take 0.5 mg by mouth every 8 (eight) hours as needed for anxiety.   Yes Historical  Provider, MD  zolpidem (AMBIEN) 10 MG tablet Take 10 mg by mouth at bedtime.    Yes Historical Provider, MD    Allergies:   Allergies  Allergen Reactions  . Oxycodone     Itching     Pt states she does ok with hydrocodone  . Sulfa Antibiotics Hives    Social History:  reports that she quit smoking about 3 years ago. Her smoking use included Cigarettes. She has a 45 pack-year smoking history. She has never used smokeless tobacco. She reports that she uses illicit drugs (Marijuana). She reports that she does not drink alcohol.  Family History: Family History  Problem Relation Age of Onset  . Asthma Mother   . Heart disease Mother   . Asthma Maternal Grandmother   . Lung cancer Father   . Cancer Father   . Ovarian cancer Maternal Aunt   . Breast cancer Maternal Aunt     x2  . Prostate cancer Maternal Uncle   . Kidney cancer Sister   . Anesthesia problems Neg Hx   . Colon cancer Brother     Physical Exam: Filed Vitals:   08/27/14 2232 08/27/14 2300 08/27/14 2340 08/28/14 0000  BP: 144/59 116/47 115/47 126/50  Pulse: 77 62 63 67  Temp: 98.2 F (36.8 C)     TempSrc: Oral     Resp: '19 22 12 21  '$ SpO2: 97% 97% 99% 98%   General appearance: alert, cooperative and no distress Head: Normocephalic, without obvious abnormality, atraumatic Eyes: negative Nose: Nares normal. Septum midline. Mucosa normal. No drainage or sinus tenderness. Neck: no JVD and supple, symmetrical, trachea midline Lungs: clear to auscultation bilaterally Heart: regular rate and rhythm, S1, S2 normal, no murmur, click, rub or gallop Abdomen: soft, non-tender; bowel sounds normal; no masses,  no organomegaly Extremities: extremities normal, atraumatic, no cyanosis or edema Pulses: 2+ and symmetric Skin: Skin color, texture, turgor normal. No rashes or lesions Neurologic: Grossly normal    Labs on Admission:   Recent Labs  08/27/14 2235  NA 136  K 3.5  CL 105  CO2 21*  GLUCOSE 125*  BUN 15   CREATININE 0.69  CALCIUM 8.8*    Recent Labs  08/27/14 2235  WBC 7.1  HGB 11.0*  HCT 33.9*  MCV 76.5*  PLT 294    Recent Labs  08/27/14 2235  TROPONINI <0.03    Radiological Exams on Admission: Dg Chest 2 View  08/27/2014   CLINICAL DATA:  LEFT-sided chest pain. LEFT arm numbness today. History of lung cancer. History of colon cancer.  EXAM: CHEST  2 VIEW  COMPARISON:  05/02/2014.  FINDINGS: Cardiopericardial silhouette within normal limits. Mediastinal  contours appear normal. Curvilinear density at the RIGHT lung base is compatible with scarring seen on prior chest CT. There is no airspace disease. No pleural effusion.  IMPRESSION: Chronic RIGHT basilar scarring without acute cardiopulmonary disease.   Electronically Signed   By: Dereck Ligas M.D.   On: 08/27/2014 22:57    Assessment/Plan  64 yo female with atypical chest pain with some typical features with multiple cardiac risk factors  Principal Problem:   Chest pain-  Serial cardiac markers.  ekg nonacute at this time with initial normal troponin.  Echo in am.  Aspirin.  Pain free at this time.  obs on tele bed.  Will need full stress testing at some point which probably could be done as outpatient if everything normal and remains pain free.  Active Problems:     COPD (chronic obstructive pulmonary disease)-  Stable, cont home meds   Hypercholesterolemia- cont lipitor   Chronic headaches- stable   PAF (paroxysmal atrial fibrillation)- currently nsr.   Anxiety-  stable  obs on tele.  Full code.  Tiara Maultsby A 08/28/2014, 12:36 AM

## 2014-08-28 NOTE — ED Notes (Signed)
Attempted to call report

## 2014-08-28 NOTE — Progress Notes (Signed)
Orders received for pt discharge.  Discharge summary printed and reviewed with pt.  Explained medication regimen, and pt had no further questions at this time.  IV removed and site remains clean, dry, intact.  Telemetry removed.  Pt in stable condition and awaiting transport. 

## 2014-08-28 NOTE — ED Notes (Signed)
Admitting at bedside 

## 2014-09-05 ENCOUNTER — Encounter: Payer: Self-pay | Admitting: Internal Medicine

## 2014-09-05 ENCOUNTER — Other Ambulatory Visit (HOSPITAL_COMMUNITY): Payer: Self-pay | Admitting: Internal Medicine

## 2014-09-05 DIAGNOSIS — Z1231 Encounter for screening mammogram for malignant neoplasm of breast: Secondary | ICD-10-CM

## 2014-10-03 ENCOUNTER — Ambulatory Visit (HOSPITAL_COMMUNITY): Payer: Medicare Other

## 2014-10-03 ENCOUNTER — Ambulatory Visit (HOSPITAL_COMMUNITY)
Admission: RE | Admit: 2014-10-03 | Discharge: 2014-10-03 | Disposition: A | Discharge status: Home / Self Care | Payer: Medicare Other | Source: Ambulatory Visit | Attending: Internal Medicine | Admitting: Internal Medicine

## 2014-10-03 ENCOUNTER — Ambulatory Visit (AMBULATORY_SURGERY_CENTER): Payer: Self-pay

## 2014-10-03 VITALS — Ht 66.5 in | Wt 168.8 lb

## 2014-10-03 DIAGNOSIS — Z1231 Encounter for screening mammogram for malignant neoplasm of breast: Secondary | ICD-10-CM | POA: Insufficient documentation

## 2014-10-03 DIAGNOSIS — E039 Hypothyroidism, unspecified: Secondary | ICD-10-CM | POA: Diagnosis not present

## 2014-10-03 DIAGNOSIS — E785 Hyperlipidemia, unspecified: Secondary | ICD-10-CM | POA: Diagnosis not present

## 2014-10-03 DIAGNOSIS — Z860100 Personal history of colon polyps, unspecified: Secondary | ICD-10-CM

## 2014-10-03 DIAGNOSIS — Z780 Asymptomatic menopausal state: Secondary | ICD-10-CM | POA: Diagnosis not present

## 2014-10-03 MED ORDER — SUPREP BOWEL PREP KIT 17.5-3.13-1.6 GM/177ML PO SOLN: 1.0000 | Freq: Once | ORAL | Status: DC

## 2014-10-03 NOTE — Progress Notes (Signed)
No allergies to eggs or soy No diet/weight loss meds No home oxygen No past problems with anesthesia (except PONV with general)  No email

## 2014-10-07 DIAGNOSIS — Z86010 Personal history of colonic polyps: Secondary | ICD-10-CM | POA: Diagnosis not present

## 2014-10-07 DIAGNOSIS — Z1389 Encounter for screening for other disorder: Secondary | ICD-10-CM | POA: Diagnosis not present

## 2014-10-07 DIAGNOSIS — Z23 Encounter for immunization: Secondary | ICD-10-CM | POA: Diagnosis not present

## 2014-10-07 DIAGNOSIS — F418 Other specified anxiety disorders: Secondary | ICD-10-CM | POA: Diagnosis not present

## 2014-10-07 DIAGNOSIS — M859 Disorder of bone density and structure, unspecified: Secondary | ICD-10-CM | POA: Diagnosis not present

## 2014-10-07 DIAGNOSIS — E785 Hyperlipidemia, unspecified: Secondary | ICD-10-CM | POA: Diagnosis not present

## 2014-10-07 DIAGNOSIS — J449 Chronic obstructive pulmonary disease, unspecified: Secondary | ICD-10-CM | POA: Diagnosis not present

## 2014-10-07 DIAGNOSIS — E039 Hypothyroidism, unspecified: Secondary | ICD-10-CM | POA: Diagnosis not present

## 2014-10-07 DIAGNOSIS — I4891 Unspecified atrial fibrillation: Secondary | ICD-10-CM | POA: Diagnosis not present

## 2014-10-07 DIAGNOSIS — F325 Major depressive disorder, single episode, in full remission: Secondary | ICD-10-CM | POA: Diagnosis not present

## 2014-10-07 DIAGNOSIS — Z6826 Body mass index (BMI) 26.0-26.9, adult: Secondary | ICD-10-CM | POA: Diagnosis not present

## 2014-10-07 DIAGNOSIS — Z0000 Encounter for general adult medical examination without abnormal findings: Secondary | ICD-10-CM | POA: Diagnosis not present

## 2014-10-17 ENCOUNTER — Ambulatory Visit (AMBULATORY_SURGERY_CENTER): Payer: Medicare Other | Admitting: Internal Medicine

## 2014-10-17 ENCOUNTER — Encounter: Payer: Self-pay | Admitting: Internal Medicine

## 2014-10-17 VITALS — BP 111/80 | HR 68 | Temp 97.9°F | Resp 20 | Ht 66.0 in | Wt 168.0 lb

## 2014-10-17 DIAGNOSIS — J449 Chronic obstructive pulmonary disease, unspecified: Secondary | ICD-10-CM | POA: Diagnosis not present

## 2014-10-17 DIAGNOSIS — Z86010 Personal history of colonic polyps: Secondary | ICD-10-CM | POA: Diagnosis not present

## 2014-10-17 DIAGNOSIS — D125 Benign neoplasm of sigmoid colon: Secondary | ICD-10-CM

## 2014-10-17 DIAGNOSIS — K635 Polyp of colon: Secondary | ICD-10-CM

## 2014-10-17 DIAGNOSIS — Z1211 Encounter for screening for malignant neoplasm of colon: Secondary | ICD-10-CM | POA: Diagnosis not present

## 2014-10-17 MED ORDER — SODIUM CHLORIDE 0.9 % IV SOLN: 500.0000 mL | INTRAVENOUS | Status: DC

## 2014-10-17 NOTE — Progress Notes (Signed)
Called to room to assist during endoscopic procedure.  Patient ID and intended procedure confirmed with present staff. Received instructions for my participation in the procedure from the performing physician.  

## 2014-10-17 NOTE — Patient Instructions (Signed)
Discharge instructions given. Handout on polyps and diverticulosis. Resume previous medications. YOU HAD AN ENDOSCOPIC PROCEDURE TODAY AT [facility]:   Refer to the procedure report that was given to you for any specific questions about what was found during the examination.  If the procedure report does not answer your questions, please call your gastroenterologist to clarify.  If you requested that your care partner not be given the details of your procedure findings, then the procedure report has been included in a sealed envelope for you to review at your convenience later.  YOU SHOULD EXPECT: Some feelings of bloating in the abdomen. Passage of more gas than usual.  Walking can help get rid of the air that was put into your GI tract during the procedure and reduce the bloating. If you had a lower endoscopy (such as a colonoscopy or flexible sigmoidoscopy) you may notice spotting of blood in your stool or on the toilet paper. If you underwent a bowel prep for your procedure, you may not have a normal bowel movement for a few days.  Please Note:  You might notice some irritation and congestion in your nose or some drainage.  This is from the oxygen used during your procedure.  There is no need for concern and it should clear up in a day or so.  SYMPTOMS TO REPORT IMMEDIATELY:   Following lower endoscopy (colonoscopy or flexible sigmoidoscopy):  Excessive amounts of blood in the stool  Significant tenderness or worsening of abdominal pains  Swelling of the abdomen that is new, acute  Fever of 100F or higher   For urgent or emergent issues, a gastroenterologist can be reached at any hour by calling [phone].   DIET: Your first meal following the procedure should be a small meal and then it is ok to progress to your normal diet. Heavy or fried foods are harder to digest and may make you feel nauseous or bloated.  Likewise, meals heavy in dairy and vegetables can  increase bloating.  Drink plenty of fluids but you should avoid alcoholic beverages for 24 hours.  ACTIVITY:  You should plan to take it easy for the rest of today and you should NOT DRIVE or use heavy machinery until tomorrow (because of the sedation medicines used during the test).    FOLLOW UP: Our staff will call the number listed on your records the next business day following your procedure to check on you and address any questions or concerns that you may have regarding the information given to you following your procedure. If we do not reach you, we will leave a message.  However, if you are feeling well and you are not experiencing any problems, there is no need to return our call.  We will assume that you have returned to your regular daily activities without incident.  If any biopsies were taken you will be contacted by phone or by letter within the next 1-3 weeks.  Please call us at [phone] if you have not heard about the biopsies in 3 weeks.    SIGNATURES/CONFIDENTIALITY: You and/or your care partner have signed paperwork which will be entered into your electronic medical record.  These signatures attest to the fact that that the information above on your After Visit Summary has been reviewed and is understood.  Full responsibility of the confidentiality of this discharge information lies with you and/or your care-partner.

## 2014-10-17 NOTE — Progress Notes (Signed)
A/ox3 pleased with MAC, report to Celia RN 

## 2014-10-17 NOTE — Op Note (Signed)
[place]  [address]. [place], [address]   COLONOSCOPY PROCEDURE REPORT  PATIENT: Andrea Morgan, Andrea Morgan  MR#: [id number] BIRTHDATE: [birth year] , 42  yrs. old GENDER: female ENDOSCOPIST: Jerene Bears, MD PROCEDURE DATE:  10/17/2014 PROCEDURE:   Colonoscopy, surveillance and Colonoscopy with snare polypectomy First Screening Colonoscopy - Avg.  risk and is 50 yrs.  old or older - No.  Prior Negative Screening - Now for repeat screening. N/A  History of Adenoma - Now for follow-up colonoscopy & has been > or = to 3 yrs.  No.  It has been less than 3 yrs since last colonoscopy.  Medical reason.  Polyps removed today? Yes ASA CLASS:   Class III INDICATIONS:Surveillance due to prior colonic neoplasia. history of multiple adenomatous colon polyps, one large polyp in the hepatic flexure status post right hemicolectomy 2015. Last colonoscopy 09/27/2013 MEDICATIONS: Monitored anesthesia care and Propofol 350 mg IV  DESCRIPTION OF PROCEDURE:   After the risks benefits and alternatives of the procedure were thoroughly explained, informed consent was obtained.  The digital rectal exam revealed no rectal mass.   The LB PFC-H190 [id number]  endoscope was introduced through the anus and advanced to the surgical anastomosis. No adverse events experienced.   The quality of the prep was good.  (Suprep was used)  The instrument was then slowly withdrawn as the colon was fully examined. Estimated blood loss is zero unless otherwise noted in this procedure report.   COLON FINDINGS: There was evidence of a normal appearing prior surgical, ileocolonic, anastomosis in the right colon.   A pedunculated polyp measuring 7 mm in size was found in the sigmoid colon.  A polypectomy was performed using snare cautery.  The resection was complete, the polyp tissue was completely retrieved and sent to histology.   A sessile polyp measuring 4 mm in size was found in the sigmoid colon.  A  polypectomy was performed with a cold snare.  The resection was complete, the polyp tissue was completely retrieved and sent to histology.   There was mild diverticulosis noted in the left colon.  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 The time to cecum = 3.1 Withdrawal time = 12.4   The scope was withdrawn and the procedure completed.  COMPLICATIONS: There were no immediate complications.  ENDOSCOPIC IMPRESSION: 1.   There was evidence of prior surgical anastomosis in the right colon 2.   Pedunculated polyp was found in the sigmoid colon; polypectomy was performed using snare cautery 3.   Sessile polyp was found in the sigmoid colon; polypectomy was performed with a cold snare 4.   Mild diverticulosis was noted in the left colon 5.   Anal polyp, previously biopsied condyloma      RECOMMENDATIONS: 1.  Await pathology results 2.  High fiber diet 3.  Timing of repeat colonoscopy will be determined by pathology findings. 4.  You will receive a letter within 1-2 weeks with the results of your biopsy as well as final recommendations.  Please call my office if you have not received a letter after 3 weeks. 5.  Will ask for Dr. Marcello Moores' opinion regarding condyloma management or need for surveillance biopsy of this lesion  eSigned:  Jerene Bears, MD 10/17/2014 2:49 PM   cc:  [name], the patient, [name]   PATIENT NAME:  Andrea Morgan, Andrea Morgan MR#: [id number]

## 2014-10-18 ENCOUNTER — Telehealth: Payer: Self-pay | Admitting: Emergency Medicine

## 2014-10-18 NOTE — Telephone Encounter (Signed)
No identifier, left message to f/u

## 2014-10-18 NOTE — Progress Notes (Signed)
Pt scheduled to see Dr. Marcello Moores with [facility] 11/04/14'@4'$ :30pm. Pt to arrive there at 4pm. Left message for pt to call back.

## 2014-10-22 NOTE — Progress Notes (Signed)
Spoke with pt and she is aware of appt date and time. 

## 2014-10-24 ENCOUNTER — Encounter: Payer: Self-pay | Admitting: Internal Medicine

## 2014-11-04 DIAGNOSIS — Z8619 Personal history of other infectious and parasitic diseases: Secondary | ICD-10-CM | POA: Diagnosis not present

## 2014-12-25 ENCOUNTER — Encounter: Payer: Self-pay | Admitting: *Deleted

## 2014-12-26 ENCOUNTER — Other Ambulatory Visit: Payer: Self-pay | Admitting: Nurse Practitioner

## 2014-12-30 ENCOUNTER — Ambulatory Visit: Payer: Medicare Other | Admitting: Cardiology

## 2015-02-10 ENCOUNTER — Ambulatory Visit: Payer: Medicare Other | Admitting: Cardiology

## 2015-02-19 ENCOUNTER — Ambulatory Visit: Payer: Medicare Other | Admitting: Nurse Practitioner

## 2015-03-21 DIAGNOSIS — R05 Cough: Secondary | ICD-10-CM | POA: Diagnosis not present

## 2015-03-21 DIAGNOSIS — J22 Unspecified acute lower respiratory infection: Secondary | ICD-10-CM | POA: Diagnosis not present

## 2015-03-24 ENCOUNTER — Other Ambulatory Visit: Payer: Self-pay | Admitting: Nurse Practitioner

## 2015-04-08 DIAGNOSIS — E784 Other hyperlipidemia: Secondary | ICD-10-CM | POA: Diagnosis not present

## 2015-04-08 DIAGNOSIS — K219 Gastro-esophageal reflux disease without esophagitis: Secondary | ICD-10-CM | POA: Diagnosis not present

## 2015-04-08 DIAGNOSIS — F418 Other specified anxiety disorders: Secondary | ICD-10-CM | POA: Diagnosis not present

## 2015-04-08 DIAGNOSIS — Z6828 Body mass index (BMI) 28.0-28.9, adult: Secondary | ICD-10-CM | POA: Diagnosis not present

## 2015-04-16 ENCOUNTER — Other Ambulatory Visit: Payer: Self-pay | Admitting: Cardiothoracic Surgery

## 2015-04-16 DIAGNOSIS — C761 Malignant neoplasm of thorax: Secondary | ICD-10-CM

## 2015-04-24 ENCOUNTER — Other Ambulatory Visit: Payer: Self-pay | Admitting: Nurse Practitioner

## 2015-05-14 ENCOUNTER — Ambulatory Visit
Admission: RE | Admit: 2015-05-14 | Discharge: 2015-05-14 | Disposition: A | Discharge status: Home / Self Care | Payer: Medicare Other | Source: Ambulatory Visit | Attending: Cardiothoracic Surgery | Admitting: Cardiothoracic Surgery

## 2015-05-14 DIAGNOSIS — R918 Other nonspecific abnormal finding of lung field: Secondary | ICD-10-CM | POA: Diagnosis not present

## 2015-05-14 DIAGNOSIS — C761 Malignant neoplasm of thorax: Secondary | ICD-10-CM

## 2015-05-15 ENCOUNTER — Inpatient Hospital Stay: Admission: RE | Admit: 2015-05-15 | Payer: Medicare Other | Source: Ambulatory Visit

## 2015-05-15 ENCOUNTER — Encounter: Payer: Self-pay | Admitting: Cardiothoracic Surgery

## 2015-05-15 ENCOUNTER — Ambulatory Visit (INDEPENDENT_AMBULATORY_CARE_PROVIDER_SITE_OTHER): Payer: Medicare Other | Admitting: Cardiothoracic Surgery

## 2015-05-15 VITALS — BP 138/71 | HR 76 | Resp 16 | Ht 66.5 in | Wt 169.0 lb

## 2015-05-15 DIAGNOSIS — Z9889 Other specified postprocedural states: Secondary | ICD-10-CM

## 2015-05-15 DIAGNOSIS — C761 Malignant neoplasm of thorax: Secondary | ICD-10-CM

## 2015-05-15 NOTE — Progress Notes (Signed)
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 She has been a long-term smoker, at least 35 PPY.  bshe has  has quit and remained off cigarettes e.  She does have some sob with exertion, but stable, denies chest pain.      History  Smoking status  . Former Smoker -- 1.00 packs/day for 45 years  . Types: Cigarettes  . Quit date: 04/14/2011  Smokeless tobacco  . Never Used       Allergies  Allergen Reactions  . Oxycodone     Itching     Pt states she does ok with hydrocodone  . Sulfa Antibiotics Hives    Current Outpatient Prescriptions  Medication Sig Dispense Refill  . albuterol (PROVENTIL HFA;VENTOLIN HFA) 108 (90 BASE) MCG/ACT inhaler Inhale 1 puff into the lungs every 6 (six) hours as needed for wheezing or shortness of breath.    Marland Kitchen aspirin EC 81 MG tablet Take 81 mg by mouth daily.    Marland Kitchen atorvastatin (LIPITOR) 20 MG tablet Take 20 mg by mouth daily. Takes at bedtime    . Cholecalciferol (VITAMIN D) 2000 units tablet Take 2,000 Units by mouth daily.    Marland Kitchen diltiazem (CARDIZEM CD) 120 MG 24 hr capsule Take 120 mg by mouth daily.    Marland Kitchen escitalopram (LEXAPRO) 10 MG tablet Take 10 mg by mouth daily.    . famotidine (PEPCID) 20 MG tablet Take 20 mg by mouth daily.    Marland Kitchen LORazepam (ATIVAN) 0.5 MG tablet Take 0.5 mg by mouth every 8 (eight) hours as needed for anxiety.    Marland Kitchen zolpidem (AMBIEN) 10 MG  tablet Take 10 mg by mouth at bedtime.      No current facility-administered medications for this visit.       Physical Exam: BP 138/71 mmHg  Pulse 76  Resp 16  Ht 5' 6.5" (1.689 m)  Wt 169 lb (76.658 kg)  BMI 26.87 kg/m2  SpO2 98%  General appearance: alert and cooperative Neurologic: intact Heart: regular rate and rhythm, S1, S2 normal, no murmur, click, rub or gallop Lungs: clear to auscultation bilaterally Abdomen: soft, non-tender; bowel sounds normal; no masses,  no organomegaly Extremities: extremities normal, atraumatic, no cyanosis or edema and Homans sign is negative, no sign of DVT Wounds:healing well No cervical  Or  axillary adenopathy She has palpable DP and PT pulses and radial pulses bilaterally  Diagnostic Studies & Laboratory data:         Recent Radiology Findings:  Ct Chest Wo Contrast  05/14/2015  CLINICAL DATA:  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 Shortness of breath. Treated for pneumonia 2 months ago. EXAM: CT CHEST WITHOUT CONTRAST TECHNIQUE: Multidetector CT imaging of the  chest was performed following the standard protocol without IV contrast. COMPARISON:  Radiographs 08/27/2014.  CTs 05/02/2014 and 04/26/2013. FINDINGS: Mediastinum/Nodes: There are no enlarged mediastinal, hilar or axillary lymph nodes.Hilar assessment is limited by the lack of intravenous contrast, although the hilar contours appear unchanged. Stable small hiatal hernia. The heart size is normal. There is no pericardial effusion. There is mild atherosclerosis of the aorta, great vessels and coronary arteries. The ascending aorta is mildly dilated to 3.4 cm. Lungs/Pleura: There is no pleural effusion. Stable emphysema with linear postsurgical scarring in the right middle lobe. There is a stable 4 mm right upper lobe pulmonary nodule on image number 21, not significantly changed from prior study 11/05/2012 and therefore benign. No suspicious  pulmonary nodules. Upper abdomen: The visualized upper abdomen appears stable with a stable small subcapsular hepatic lesion on image 7 the, a 2.6 cm low-density left adrenal adenoma and left renal cortical scarring. Musculoskeletal/Chest wall: There is no chest wall mass or suspicious osseous finding. Previous lower cervical fusion. IMPRESSION: 1. Stable postoperative chest CT with scarring in the right middle lobe. No evidence of local recurrence or metastatic disease. 2. Stable emphysema and 4 mm benign right upper lobe pulmonary nodule. 3. Stable findings in the visualized upper abdomen, including a left adrenal adenoma. Electronically Signed   By: Richardean Sale M.D.   On: 05/14/2015 12:41   Ct Chest Wo Contrast  05/02/2014   CLINICAL DATA:  History of right lower lobe mass resection. Ex-smoker. No current complaints.  EXAM: CT CHEST WITHOUT CONTRAST  TECHNIQUE: Multidetector CT imaging of the chest was performed following the standard protocol without IV contrast.  COMPARISON:  Chest radiograph of 11/11/2013 and CT of 04/26/2013.  FINDINGS: Mediastinum/Nodes: No supraclavicular adenopathy. Aortic atherosclerosis. Normal heart size, without pericardial effusion. LAD coronary artery atherosclerosis. No mediastinal or definite hilar adenopathy, given limitations of unenhanced CT. Small hiatal hernia.  Lungs/Pleura: Presumed secretions within the dependent trachea. Moderate centrilobular emphysema.  Similar volume loss and scarring at the anterior right lung base. No new nodules or airspace opacities.  No pleural fluid.  Upper abdomen: Subcapsular right hepatic lobe low-density focus on image 71 is similar to 03/09/2012 and likely benign. Normal imaged portions of the spleen, pancreas, gallbladder, biliary tract, adrenal glands. Mildly scarred left kidney. Left adrenal adenoma which is unchanged at approximately 2.6 cm.  Musculoskeletal: No acute osseous abnormality. Lower cervical spine fusion may be  developmental or postsurgical. C6-7.  IMPRESSION: 1. Volume loss and scarring at the right lung base. No evidence of recurrent or metastatic disease. 2. Atherosclerosis, Age advanced coronary artery atherosclerosis. Recommend assessment of coronary risk factors and consideration of medical therapy. 3. Left Adrenal adenoma.   Electronically Signed   By: Abigail Miyamoto M.D.   On: 05/02/2014 15:02  I have independently reviewed the above radiology studies  and reviewed the findings with the patient.  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 Microscopic Comment The  tumor is composed of relatively uniform ovoid to spindle cells demonstrating a haphazard morphologic pattern. Within the tumor, there is prominent vasculature that in some areas demonstrates a staghorn configuration. There is abundant hyalinization with focal calcification and patchy areas of necrosis present. Although the nuclei are relatively monomorphic, there are patchy areas of significant nuclear atypia. Mitotic activity is variable with up to 3 mitoses per high-power field. The immunophenotype is as follows: Vimentin Diffuse strong expression SMA Negative expression EMA Negative expression S-100 Negative expression Desmin Negative expression Chromogranin Negative expression Cytokeratin AE1/3 Negative expression CD-99 Diffuse moderate to strong expression CD-34 Diffuse strong expression CD-31 Negative expression Calretinin Negative expression BCL-2 Diffuse strong  expression Overall, the morphology and immunophenotype are diagnostic of solitary fibrous tumor. 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 The tumor is relatively large (greater than 10cm), is focally necrotic, exhibits cytologic atypia and more than an occasional mitotic figure is identified. Although there is no anaplastic cytologic features, significantly increased mitotic activity, or true sarcomatous differentiation, the morphologic findings are atypical for the "usual" solitary fibrous tumor. Some studies have shown that even with atypical or frankly malignant features, approximately 50% of those tumors actually pursued an aggressive clinical course. Regardless, histopathology is not discriminatory to predict clinical behavior and as such, all pleuropulmonary solitary fibrous tumors, atypical or otherwise, should be consider to be tumors of uncertain malignant potential. The case was reviewed with Dr. Lyndon Code who concurs. (CR:mw 07-02-11)  Assessment / Plan:   The patient was congratulated on not smoking  We discussed the findings on CT scan of COPD/emphysema/cad No evidence of recurrent or metastatic disease on CT scan Atherosclerosis, Age advanced coronary artery atherosclerosis by CT. Patient has had assessment of coronary risk factors and consideration of medical therapy by cardiology Left Adrenal adenoma.  I will referr her to LUNG CANCER Screening program for further CT of chest next year  Grace Isaac MD 05/15/2015 2:26 PM

## 2015-07-04 ENCOUNTER — Other Ambulatory Visit: Payer: Self-pay | Admitting: Cardiology

## 2015-07-04 MED ORDER — DILTIAZEM HCL ER COATED BEADS 120 MG PO CP24: 120.0000 mg | ORAL_CAPSULE | Freq: Every day | ORAL | Status: DC

## 2015-07-16 ENCOUNTER — Ambulatory Visit: Payer: Medicare Other | Admitting: Nurse Practitioner

## 2015-07-29 ENCOUNTER — Telehealth: Payer: Self-pay | Admitting: Acute Care

## 2015-07-29 NOTE — Telephone Encounter (Signed)
Received Lung Cancer Screening Referral from Dr. Servando Snare.  Spoke with Andrea Morgan regarding program and discussed questionnaire. Andrea Morgan states she smoked 0.3 cigarettes/day x 35 years.  Unfortunately, with this information, Andrea Morgan does not qualify for the program.  Discussed qualifying data with Andrea Morgan and advised she does not currently meet criteria for this program.  Encouraged Andrea Morgan to discuss annual CT Chest scans with Dr. Servando Snare and request he order if he feels this is appropriate.  Andrea Morgan verbalized understanding and voiced no further questions or concerns at this time.  Will sign off and route to Dr. Servando Snare as an Juluis Rainier that Andrea Morgan does not qualify for program.

## 2015-08-01 ENCOUNTER — Telehealth: Payer: Self-pay | Admitting: *Deleted

## 2015-08-01 NOTE — Telephone Encounter (Signed)
LVM to let pt know new appointment time and date due to providers vacation schedule.

## 2015-08-15 ENCOUNTER — Ambulatory Visit: Payer: Medicare Other | Admitting: Nurse Practitioner

## 2015-08-20 ENCOUNTER — Other Ambulatory Visit: Payer: Self-pay | Admitting: Acute Care

## 2015-08-20 DIAGNOSIS — J449 Chronic obstructive pulmonary disease, unspecified: Secondary | ICD-10-CM

## 2015-08-26 ENCOUNTER — Ambulatory Visit (INDEPENDENT_AMBULATORY_CARE_PROVIDER_SITE_OTHER): Payer: Medicare Other | Admitting: Nurse Practitioner

## 2015-08-26 ENCOUNTER — Encounter: Payer: Self-pay | Admitting: Nurse Practitioner

## 2015-08-26 VITALS — BP 130/80 | HR 72 | Ht 66.5 in | Wt 175.1 lb

## 2015-08-26 DIAGNOSIS — E7800 Pure hypercholesterolemia, unspecified: Secondary | ICD-10-CM

## 2015-08-26 DIAGNOSIS — I480 Paroxysmal atrial fibrillation: Secondary | ICD-10-CM | POA: Diagnosis not present

## 2015-08-26 MED ORDER — DILTIAZEM HCL ER COATED BEADS 120 MG PO CP24: 120.0000 mg | ORAL_CAPSULE | Freq: Every day | ORAL | Status: DC

## 2015-08-26 NOTE — Progress Notes (Signed)
CARDIOLOGY OFFICE NOTE  Date:  08/26/2015    [name] Date of Birth: 08-28-1950 Medical Record #[id number]  PCP:  [name], MD  Cardiologist:  Former patient of Dr. Sherryl Barters.    Chief Complaint  Patient presents with  . Atrial Fibrillation  . Hyperlipidemia    Follow up visit - former patient of Dr. Sherryl Barters    History of Present Illness: Andrea Morgan is a 65 y.o. female who presents today for a follow up visit. Former patient of Dr. Sherryl Barters.   She HLD, insomnia and has had a spell of PAF following colectomy. Echo was normal. Lone spell of AF and not felt to need anticoagulation. She is a former smoker.   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 Servando Snare.   Comes in today. Here alone. Says she is doing ok. Rhythm ok. No chest pain. Stable shortness of breath. Was released from [facility] and to have followup screening thru the lung cancer program at [facility]. Not smoking. Stable shortness of breath. Physical is next month with her labs. Here to just get her medicine refilled.   Past Medical History  Diagnosis Date  . Insomnia   . Chronic headaches   . Allergic rhinitis   . Depression   . PONV (postoperative nausea and vomiting)   . Shortness of breath     with exercsie  . GERD (gastroesophageal reflux disease)   . H/O hiatal hernia   . History of kidney stones   . Anemia   . COPD (chronic obstructive pulmonary disease) ([place])   . Emphysema of lung ([place])   . Hyperlipidemia   . Anxiety     experienced panic attacks at home after her last surgery  . Arthritis     arthritis left jaw / pain    . 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 Stiffness in joint     neck - s/p cervical fusion  . PAF (paroxysmal atrial fibrillation) ([place]) 10/2013    Post-op Afib.    Past Surgical History  Procedure Laterality Date  . Appendectomy  1978  . Tonsillectomy   1971  . Cervical fusion  1997  . Video bronchoscopy  05/11/2011    Procedure: VIDEO BRONCHOSCOPY WITH FLUORO;  Surgeon: Collene Gobble, MD;  Location: [facility];  Service: Cardiopulmonary;  Laterality: Bilateral;  . Cervical fusion    . Esophagogastroduodenoscopy  05/21/2011    Procedure: ESOPHAGOGASTRODUODENOSCOPY (EGD);  Surgeon: Beryle Beams, MD;  Location: [facility];  Service: Endoscopy;  Laterality: N/A;  . Video bronchoscopy  06/02/2011    Procedure: VIDEO BRONCHOSCOPY;  Surgeon: Grace Isaac, MD;  Location: [place];  Service: Thoracic;  Laterality: N/A;  . Mass excision  06/02/2011    Procedure: EXCISION MASS;  Surgeon: Grace Isaac, MD;  Location: [place];  Service: Thoracic;;  . Laparoscopic partial colectomy N/A 11/08/2013    Procedure: laparoscopic partial right colectomy;  Surgeon: [name], MD;  Location: [facility];  Service: General;  Laterality: N/A;  . Examination under anesthesia N/A 11/08/2013    Procedure: ANAL [place];  Surgeon: [name], MD;  Location: [facility];  Service: General;  Laterality: N/A;  . Transthoracic echocardiogram  11/12/2013    Normal LV size & function, EF 55-60%.  No valvular lesions or WMA.  Medications: Current Outpatient Prescriptions  Medication Sig Dispense Refill  . albuterol (PROVENTIL HFA;VENTOLIN HFA) 108 (90 BASE) MCG/ACT inhaler Inhale 1 puff into the lungs every 6 (six) hours as needed for wheezing or shortness of breath.    Marland Kitchen aspirin EC 81 MG tablet Take 81 mg by mouth daily.    Marland Kitchen atorvastatin (LIPITOR) 20 MG tablet Take 20 mg by mouth daily. Takes at bedtime    . carvedilol (COREG) 3.125 MG tablet Take 3.125 mg by mouth 2 (two) times daily with a meal.    . Cholecalciferol (VITAMIN D) 2000 units tablet Take 2,000 Units by mouth daily.    Marland Kitchen diltiazem (CARDIZEM CD) 120 MG 24 hr capsule Take 1 capsule (120 mg total) by mouth daily. 30 capsule 11  . escitalopram (LEXAPRO) 10 MG tablet Take 10 mg  by mouth daily.    Marland Kitchen LORazepam (ATIVAN) 1 MG tablet Take 1 mg by mouth daily as needed.  3  . omeprazole (PRILOSEC) 20 MG capsule Take 20 mg by mouth daily.    . vitamin B-12 (CYANOCOBALAMIN) 1000 MCG tablet Take 3,000 mcg by mouth daily.    Marland Kitchen zolpidem (AMBIEN) 10 MG tablet Take 10 mg by mouth at bedtime.      No current facility-administered medications for this visit.    Allergies: Allergies  Allergen Reactions  . Oxycodone     Itching     Pt states she does ok with hydrocodone  . Sulfa Antibiotics Hives    Social History: The patient  reports that she quit smoking about 4 years ago. Her smoking use included Cigarettes. She has a 45 pack-year smoking history. She has never used smokeless tobacco. She reports that she uses illicit drugs (Marijuana) about 7 times per week. She reports that she does not drink alcohol.   Family History: The patient's family history includes Asthma in her maternal grandmother and mother; Breast cancer in her maternal aunt; Colon cancer in her brother; Heart disease in her mother; Kidney cancer in her sister; Lung cancer in her father; Ovarian cancer in her maternal aunt; Prostate cancer in her maternal uncle. There is no history of Anesthesia problems.   Review of Systems: Please see the history of present illness.   Otherwise, the review of systems is positive for allergies/cough. Stable dyspnea. Weight increasing.   All other systems are reviewed and negative.   Physical Exam: VS:  BP 130/80 mmHg  Pulse 72  Ht 5' 6.5" (1.689 m)  Wt 175 lb 1.9 oz (79.434 kg)  BMI 27.84 kg/m2 .  BMI Body mass index is 27.84 kg/(m^2).  Wt Readings from Last 3 Encounters:  08/26/15 175 lb 1.9 oz (79.434 kg)  05/15/15 169 lb (76.658 kg)  10/17/14 168 lb (76.204 kg)    General: Pleasant. She is alert and in no acute distress.  HEENT: Normal. Neck: Supple, no JVD, carotid bruits, or masses noted.  Cardiac: Regular rate and rhythm. No murmurs, rubs, or gallops. No  edema.  Respiratory:  Lungs are clear to auscultation bilaterally with normal work of breathing.  GI: Soft and nontender.  MS: No deformity or atrophy. Gait and ROM intact. Skin: Warm and dry. Color is normal.  Neuro:  Strength and sensation are intact and no gross focal deficits noted.  Psych: Alert, appropriate and with normal affect.   LABORATORY DATA:  EKG:  EKG is ordered today. This shows NSR.  Lab Results  Component Value Date   WBC 7.1 08/27/2014   HGB 11.0*  08/27/2014   HCT 33.9* 08/27/2014   PLT 294 08/27/2014   GLUCOSE 125* 08/27/2014   ALT 15 06/04/2011   AST 18 06/04/2011   NA 136 08/27/2014   K 3.5 08/27/2014   CL 105 08/27/2014   CREATININE 0.69 08/27/2014   BUN 15 08/27/2014   CO2 21* 08/27/2014   INR 0.98 11/11/2013   HGBA1C 5.7* 11/07/2013    BNP (last 3 results) No results for input(s): BNP in the last 8760 hours.  ProBNP (last 3 results) No results for input(s): PROBNP in the last 8760 hours.   Other Studies Reviewed Today:  Echo Study Conclusions from 08/2014  - Left ventricle: The cavity size was normal. Systolic function was  normal. The estimated ejection fraction was in the range of 55%  to 60%. Wall motion was normal; there were no regional wall  motion abnormalities. Left ventricular diastolic function  parameters were normal.  Assessment/Plan: 1. One episode of paroxysmal atrial fibrillation occurring in the postoperative setting in July 2015. Not on long-term anticoagulation. She has not had any recurrence. Diltiazem refilled today. Will see back prn. Will see if PCP would refill Diltiazem going forward.   2. Hypercholesterolemia - labs checked by PCP  3. Past smoker  4. Stable dyspnea  Current medicines are reviewed with the patient today.  The patient does not have concerns regarding medicines other than what has been noted above.  The following changes have been made:  See above.  Labs/ tests ordered today include:      Orders Placed This Encounter  Procedures  . EKG 12-Lead     Disposition:   FU with prn.    Patient is agreeable to this plan and will call if any problems develop in the interim.   Signed: Burtis Junes, RN, ANP-C 08/26/2015 3:46 PM  [facility] [address] [place], [place]  [address] Phone: [phone] Fax: [phone]

## 2015-08-26 NOTE — Patient Instructions (Addendum)
We will be checking the following labs today - NONE   Medication Instructions:    Continue with your current medicines. I refilled your Diltiazem today.     Testing/Procedures To Be Arranged:  N/A  Follow-Up:   See [place] back as needed.     Other Special Instructions:   N/A    If you need a refill on your cardiac medications before your next appointment, please call your pharmacy.   Call the [place] office at [phone] if you have any questions, problems or concerns.

## 2015-10-03 DIAGNOSIS — M859 Disorder of bone density and structure, unspecified: Secondary | ICD-10-CM | POA: Diagnosis not present

## 2015-10-03 DIAGNOSIS — R8299 Other abnormal findings in urine: Secondary | ICD-10-CM | POA: Diagnosis not present

## 2015-10-03 DIAGNOSIS — E784 Other hyperlipidemia: Secondary | ICD-10-CM | POA: Diagnosis not present

## 2015-10-03 DIAGNOSIS — E038 Other specified hypothyroidism: Secondary | ICD-10-CM | POA: Diagnosis not present

## 2015-10-03 DIAGNOSIS — N390 Urinary tract infection, site not specified: Secondary | ICD-10-CM | POA: Diagnosis not present

## 2015-10-09 DIAGNOSIS — Z0000 Encounter for general adult medical examination without abnormal findings: Secondary | ICD-10-CM | POA: Diagnosis not present

## 2015-10-09 DIAGNOSIS — F331 Major depressive disorder, recurrent, moderate: Secondary | ICD-10-CM | POA: Diagnosis not present

## 2015-10-09 DIAGNOSIS — K219 Gastro-esophageal reflux disease without esophagitis: Secondary | ICD-10-CM | POA: Diagnosis not present

## 2015-10-09 DIAGNOSIS — E784 Other hyperlipidemia: Secondary | ICD-10-CM | POA: Diagnosis not present

## 2015-10-09 DIAGNOSIS — Z86010 Personal history of colonic polyps: Secondary | ICD-10-CM | POA: Diagnosis not present

## 2015-10-09 DIAGNOSIS — M859 Disorder of bone density and structure, unspecified: Secondary | ICD-10-CM | POA: Diagnosis not present

## 2015-10-09 DIAGNOSIS — E038 Other specified hypothyroidism: Secondary | ICD-10-CM | POA: Diagnosis not present

## 2015-10-09 DIAGNOSIS — I4891 Unspecified atrial fibrillation: Secondary | ICD-10-CM | POA: Diagnosis not present

## 2015-10-09 DIAGNOSIS — F418 Other specified anxiety disorders: Secondary | ICD-10-CM | POA: Diagnosis not present

## 2015-10-09 DIAGNOSIS — J449 Chronic obstructive pulmonary disease, unspecified: Secondary | ICD-10-CM | POA: Diagnosis not present

## 2015-10-09 DIAGNOSIS — Z1389 Encounter for screening for other disorder: Secondary | ICD-10-CM | POA: Diagnosis not present

## 2015-10-09 DIAGNOSIS — Z6826 Body mass index (BMI) 26.0-26.9, adult: Secondary | ICD-10-CM | POA: Diagnosis not present

## 2015-12-16 DIAGNOSIS — M5031 Other cervical disc degeneration,  high cervical region: Secondary | ICD-10-CM | POA: Diagnosis not present

## 2015-12-16 DIAGNOSIS — M5412 Radiculopathy, cervical region: Secondary | ICD-10-CM | POA: Diagnosis not present

## 2015-12-19 ENCOUNTER — Emergency Department (HOSPITAL_COMMUNITY): Payer: Medicare Other

## 2015-12-19 ENCOUNTER — Emergency Department (HOSPITAL_COMMUNITY)
Admission: EM | Admit: 2015-12-19 | Discharge: 2015-12-19 | Disposition: A | Discharge status: Home / Self Care | Payer: Medicare Other | Attending: Emergency Medicine | Admitting: Emergency Medicine

## 2015-12-19 ENCOUNTER — Encounter (HOSPITAL_COMMUNITY): Payer: Self-pay | Admitting: *Deleted

## 2015-12-19 DIAGNOSIS — M5413 Radiculopathy, cervicothoracic region: Secondary | ICD-10-CM | POA: Diagnosis not present

## 2015-12-19 DIAGNOSIS — Z791 Long term (current) use of non-steroidal anti-inflammatories (NSAID): Secondary | ICD-10-CM | POA: Insufficient documentation

## 2015-12-19 DIAGNOSIS — Z87891 Personal history of nicotine dependence: Secondary | ICD-10-CM | POA: Insufficient documentation

## 2015-12-19 DIAGNOSIS — Z7982 Long term (current) use of aspirin: Secondary | ICD-10-CM | POA: Insufficient documentation

## 2015-12-19 DIAGNOSIS — J449 Chronic obstructive pulmonary disease, unspecified: Secondary | ICD-10-CM | POA: Insufficient documentation

## 2015-12-19 DIAGNOSIS — M25511 Pain in right shoulder: Secondary | ICD-10-CM | POA: Diagnosis present

## 2015-12-19 DIAGNOSIS — Z79899 Other long term (current) drug therapy: Secondary | ICD-10-CM | POA: Diagnosis not present

## 2015-12-19 DIAGNOSIS — R918 Other nonspecific abnormal finding of lung field: Secondary | ICD-10-CM | POA: Diagnosis not present

## 2015-12-19 MED ORDER — HYDROCODONE-ACETAMINOPHEN 5-325 MG PO TABS: 1.0000 | ORAL_TABLET | Freq: Four times a day (QID) | ORAL | 0 refills | Status: DC | PRN

## 2015-12-19 MED ORDER — HYDROCODONE-ACETAMINOPHEN 5-325 MG PO TABS
1.0000 | ORAL_TABLET | Freq: Once | ORAL | Status: AC
  Administered 2015-12-19: 1 via ORAL
  Filled 2015-12-19: qty 1

## 2015-12-19 NOTE — ED Triage Notes (Signed)
Pt complains of right upper back pain radiating to right elbow for the past 10 days. Pt saw PCP and had x-rays performed, states she may need MRI. Pt's PA sent pt to ER for pain management. Pt is taking prednisone, muscle relaxer, and aleve with no relief.

## 2015-12-19 NOTE — ED Provider Notes (Signed)
[facility] DEPT Provider Note   CSN: [id number] Arrival date & time: 12/19/15  1230     History   Chief Complaint Chief Complaint  Patient presents with  . Back Pain  . Shoulder Pain    HPI Andrea Morgan is a 65 y.o. female.  The history is provided by the patient. No language interpreter was used.  Back Pain    Shoulder Pain     Andrea Morgan is a 65 y.o. female who presents to the Emergency Department complaining of right scapula pain.  She reports 10 days of pain around her right shoulder blade. The pain is constant and burning in nature. It radiates through her right axilla and down her right upper arm to her elbow. Pain is constant but does not change with activity. She has a history of similar pain in the past with a herniated cervical disc. She saw her PCP and was started on steroids and Flexeril without significant change in her symptoms. She was referred to the emergency department for pain control. She has a repeat appointment scheduled with her PCP in 3 days with plan for outpatient MRI. No fevers, chest pain, shortness of breath, numbness, lower extremity weakness. Past Medical History:  Diagnosis Date  . Allergic rhinitis   . Anemia   . Anxiety    experienced panic attacks at home after her last surgery  . Arthritis    arthritis left jaw / pain    . 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 Chronic headaches   . COPD (chronic obstructive pulmonary disease) ([place])   . 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 Depression   . Emphysema of lung ([place])   . GERD (gastroesophageal reflux disease)   . H/O hiatal hernia   . History of kidney stones   . Hyperlipidemia   . Insomnia   . PAF (paroxysmal atrial fibrillation) ([place]) 10/2013   Post-op Afib.  Marland Kitchen PONV (postoperative nausea and vomiting)   . Shortness of breath    with exercsie  . Stiffness in joint    neck - s/p cervical fusion    Patient Active Problem List   Diagnosis Date Noted  . Chest pain 08/28/2014  . Chronic headaches   . Anxiety   . Cephalalgia   . Hypercholesterolemia 03/11/2014  . Atrial fibrillation with RVR - post-operative  11/12/2013  . Personal history of colonic polyps 11/08/2013  . PAF (paroxysmal atrial fibrillation) ([place]) 10/17/2013  . COPD (chronic obstructive pulmonary disease) ([place]) 04/26/2013  . 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 Chest mass 05/04/2011    Past Surgical History:  Procedure Laterality Date  . APPENDECTOMY  1978  . CERVICAL FUSION  1997  . CERVICAL FUSION    . ESOPHAGOGASTRODUODENOSCOPY  05/21/2011   Procedure: ESOPHAGOGASTRODUODENOSCOPY (EGD);  Surgeon: Beryle Beams, MD;  Location: [facility];  Service: Endoscopy;  Laterality: N/A;  . EXAMINATION UNDER ANESTHESIA N/A 11/08/2013   Procedure: ANAL EXAM UNDER ANESTHESIA WITH BIOPSY;  Surgeon: [name], MD;  [facility];  Service: General;  Laterality: N/A;  . LAPAROSCOPIC PARTIAL COLECTOMY N/A 11/08/2013   Procedure: laparoscopic partial right colectomy;  Surgeon: [name], MD;  Location: [facility];  Service: General;  Laterality: N/A;  . MASS EXCISION  06/02/2011   Procedure: EXCISION MASS;  Surgeon: Grace Isaac, MD;  Location: [place];  Service: Thoracic;;  .  TONSILLECTOMY  1971  . TRANSTHORACIC ECHOCARDIOGRAM  11/12/2013   Normal LV size & function, EF 55-60%.  No valvular lesions or WMA.  Marland Kitchen VIDEO BRONCHOSCOPY  05/11/2011   Procedure: VIDEO BRONCHOSCOPY WITH FLUORO;  Surgeon: Collene Gobble, MD;  Location: [facility];  Service: Cardiopulmonary;  Laterality: Bilateral;  . VIDEO BRONCHOSCOPY  06/02/2011   Procedure: VIDEO BRONCHOSCOPY;  Surgeon: Grace Isaac, MD;  Location: [facility];  Service: Thoracic;  Laterality: N/A;    OB History    No data available       Home Medications    Prior to Admission medications   Medication Sig Start Date End Date Taking? Authorizing Provider    albuterol (PROVENTIL HFA;VENTOLIN HFA) 108 (90 BASE) MCG/ACT inhaler Inhale 1 puff into the lungs every 6 (six) hours as needed for wheezing or shortness of breath.   Yes Historical Provider, MD  aspirin EC 81 MG tablet Take 81 mg by mouth daily.   Yes Historical Provider, MD  atorvastatin (LIPITOR) 20 MG tablet Take 20 mg by mouth at bedtime.    Yes Historical Provider, MD  carvedilol (COREG) 3.125 MG tablet Take 3.125 mg by mouth 2 (two) times daily with a meal.   Yes Historical Provider, MD  Cholecalciferol (VITAMIN D) 2000 units tablet Take 2,000 Units by mouth daily.   Yes Historical Provider, MD  diltiazem (CARDIZEM CD) 120 MG 24 hr capsule Take 1 capsule (120 mg total) by mouth daily. 08/26/15  Yes Burtis Junes, NP  escitalopram (LEXAPRO) 20 MG tablet Take 20 mg by mouth daily. 12/11/15  Yes Historical Provider, MD  levothyroxine (SYNTHROID, LEVOTHROID) 50 MCG tablet Take 50 mcg by mouth daily. 12/11/15  Yes Historical Provider, MD  LORazepam (ATIVAN) 1 MG tablet Take 1 mg by mouth daily as needed for anxiety.  08/01/15  Yes Historical Provider, MD  methocarbamol (ROBAXIN) 500 MG tablet Take 500 mg by mouth every 6 (six) hours as needed for muscle spasms.   Yes Historical Provider, MD  naproxen sodium (ANAPROX) 220 MG tablet Take 440 mg by mouth every 12 (twelve) hours as needed (pain).   Yes Historical Provider, MD  predniSONE (STERAPRED UNI-PAK 21 TAB) 10 MG (21) TBPK tablet Take 10 mg by mouth daily. Taper 6 day dose pack   Yes Historical Provider, MD  vitamin B-12 (CYANOCOBALAMIN) 1000 MCG tablet Take 3,000 mcg by mouth daily.   Yes Historical Provider, MD  zolpidem (AMBIEN) 10 MG tablet Take 10 mg by mouth at bedtime.    Yes Historical Provider, MD  HYDROcodone-acetaminophen (NORCO/VICODIN) 5-325 MG tablet Take 1 tablet by mouth every 6 (six) hours as needed. 12/19/15   Quintella Reichert, MD    Family History Family History  Problem Relation Age of Onset  . Asthma Mother   . Heart disease  Mother   . Lung cancer Father   . Asthma Maternal Grandmother   . Ovarian cancer Maternal Aunt   . Breast cancer Maternal Aunt     x2  . Prostate cancer Maternal Uncle   . Kidney cancer Sister   . Colon cancer Brother   . Anesthesia problems Neg Hx     Social History Social History  Substance Use Topics  . Smoking status: Former Smoker    Packs/day: 1.00    Years: 45.00    Types: Cigarettes    Quit date: 04/14/2011  . Smokeless tobacco: Never Used  . Alcohol use No     Comment: smokes pot every once in  a while     Allergies   Oxycodone and Sulfa antibiotics   Review of Systems Review of Systems  Musculoskeletal: Positive for back pain.  All other systems reviewed and are negative.    Physical Exam Updated Vital Signs BP 154/96 (BP Location: Right Arm)   Pulse 62   Temp 98.1 F (36.7 C) (Oral)   Resp 18   SpO2 96%   Physical Exam  Constitutional: She is oriented to person, place, and time. She appears well-developed and well-nourished.  HENT:  Head: Normocephalic and atraumatic.  Cardiovascular: Normal rate and regular rhythm.   No murmur heard. Pulmonary/Chest: Effort normal and breath sounds normal. No respiratory distress.  Abdominal: Soft. There is no tenderness. There is no rebound and no guarding.  Musculoskeletal: She exhibits no edema or tenderness.  2+ radial pulses bilaterally. Tenderness to palpation throughout the right thoracic paraspinous muscles.  Neurological: She is alert and oriented to person, place, and time.  Sensation to light touch intact throughout bilateral upper extremities. 4+ out of 5 grip strength in the right hand and right upper arm.  Skin: Skin is warm and dry.  Psychiatric: She has a normal mood and affect. Her behavior is normal.  Nursing note and vitals reviewed.    ED Treatments / Results  Labs (all labs ordered are listed, but only abnormal results are displayed) Labs Reviewed - No data to display  EKG  EKG  Interpretation None       Radiology Dg Chest 2 View  Result Date: 12/19/2015 CLINICAL DATA:  Back pain EXAM: CHEST  2 VIEW COMPARISON:  08/27/2014 FINDINGS: The heart size appears normal. No pleural effusion or edema. No airspace opacities. Scar like densities in the right lung base are unchanged from previous exam. There is no suspicious bone abnormality. IMPRESSION: 1. No acute cardiopulmonary abnormalities. 2. Right base scarring. Electronically Signed   By: Kerby Moors M.D.   On: 12/19/2015 14:37    Procedures Procedures (including critical care time)  Medications Ordered in ED Medications  HYDROcodone-acetaminophen (NORCO/VICODIN) 5-325 MG per tablet 1 tablet (1 tablet Oral Given 12/19/15 1401)     Initial Impression / Assessment and Plan / ED Course  I have reviewed the triage vital signs and the nursing notes.  Pertinent labs & imaging results that were available during my care of the patient were reviewed by me and considered in my medical decision making (see chart for details).  Clinical Course    Patient here for evaluation of right sided thoracic back pain that is reproducible on examination with pain that radiates to her right upper arm. She is well perfused on examination with minimal weakness to the right upper extremity that may be secondary to pain. Discussed with patient options of MRI in the department today versus outpatient follow-up. She prefers outpatient MRI as she has a scheduled for Tuesday. Plan to treat pain currently with close return precautions for any weakness, fever, chest pain or new worrisome symptoms.  Presentation is not consistent with PE, pneumonia, dissection.  Final Clinical Impressions(s) / ED Diagnoses   Final diagnoses:  Radiculopathy of cervicothoracic region    New Prescriptions Discharge Medication List as of 12/19/2015  2:52 PM    START taking these medications   Details  HYDROcodone-acetaminophen (NORCO/VICODIN) 5-325 MG  tablet Take 1 tablet by mouth every 6 (six) hours as needed., Starting Fri 12/19/2015, Print         Quintella Reichert, MD 12/19/15 1552

## 2015-12-23 ENCOUNTER — Other Ambulatory Visit: Payer: Self-pay | Admitting: Internal Medicine

## 2015-12-23 DIAGNOSIS — M5412 Radiculopathy, cervical region: Secondary | ICD-10-CM

## 2015-12-24 ENCOUNTER — Ambulatory Visit
Admission: RE | Admit: 2015-12-24 | Discharge: 2015-12-24 | Disposition: A | Discharge status: Home / Self Care | Payer: Medicare Other | Source: Ambulatory Visit | Attending: Internal Medicine | Admitting: Internal Medicine

## 2015-12-24 DIAGNOSIS — M5021 Other cervical disc displacement,  high cervical region: Secondary | ICD-10-CM | POA: Diagnosis not present

## 2015-12-24 DIAGNOSIS — M5412 Radiculopathy, cervical region: Secondary | ICD-10-CM

## 2015-12-25 ENCOUNTER — Other Ambulatory Visit: Payer: Self-pay | Admitting: Internal Medicine

## 2015-12-25 DIAGNOSIS — M5124 Other intervertebral disc displacement, thoracic region: Secondary | ICD-10-CM | POA: Diagnosis not present

## 2015-12-25 DIAGNOSIS — Z6825 Body mass index (BMI) 25.0-25.9, adult: Secondary | ICD-10-CM | POA: Diagnosis not present

## 2015-12-25 DIAGNOSIS — M5412 Radiculopathy, cervical region: Secondary | ICD-10-CM | POA: Diagnosis not present

## 2015-12-25 DIAGNOSIS — M5114 Intervertebral disc disorders with radiculopathy, thoracic region: Secondary | ICD-10-CM | POA: Diagnosis not present

## 2015-12-29 DIAGNOSIS — Z1389 Encounter for screening for other disorder: Secondary | ICD-10-CM | POA: Diagnosis not present

## 2015-12-29 DIAGNOSIS — F331 Major depressive disorder, recurrent, moderate: Secondary | ICD-10-CM | POA: Diagnosis not present

## 2015-12-29 DIAGNOSIS — E038 Other specified hypothyroidism: Secondary | ICD-10-CM | POA: Diagnosis not present

## 2015-12-29 DIAGNOSIS — Z6826 Body mass index (BMI) 26.0-26.9, adult: Secondary | ICD-10-CM | POA: Diagnosis not present

## 2015-12-29 DIAGNOSIS — M5412 Radiculopathy, cervical region: Secondary | ICD-10-CM | POA: Diagnosis not present

## 2015-12-29 DIAGNOSIS — F418 Other specified anxiety disorders: Secondary | ICD-10-CM | POA: Diagnosis not present

## 2016-01-06 ENCOUNTER — Other Ambulatory Visit: Payer: Self-pay | Admitting: Neurosurgery

## 2016-01-06 DIAGNOSIS — M546 Pain in thoracic spine: Secondary | ICD-10-CM | POA: Diagnosis not present

## 2016-01-06 DIAGNOSIS — M5124 Other intervertebral disc displacement, thoracic region: Secondary | ICD-10-CM | POA: Diagnosis not present

## 2016-01-06 DIAGNOSIS — M5414 Radiculopathy, thoracic region: Secondary | ICD-10-CM | POA: Diagnosis not present

## 2016-01-06 DIAGNOSIS — M5030 Other cervical disc degeneration, unspecified cervical region: Secondary | ICD-10-CM | POA: Diagnosis not present

## 2016-01-06 DIAGNOSIS — M47812 Spondylosis without myelopathy or radiculopathy, cervical region: Secondary | ICD-10-CM | POA: Diagnosis not present

## 2016-01-06 DIAGNOSIS — M542 Cervicalgia: Secondary | ICD-10-CM | POA: Diagnosis not present

## 2016-01-12 ENCOUNTER — Encounter (HOSPITAL_COMMUNITY): Payer: Self-pay | Admitting: *Deleted

## 2016-01-12 NOTE — Anesthesia Preprocedure Evaluation (Addendum)
Anesthesia Evaluation  Patient identified by MRN, date of birth, ID band Patient awake    Reviewed: Allergy & Precautions, H&P , NPO status , Patient's Chart, lab work & pertinent test results, reviewed documented beta blocker date and time   History of Anesthesia Complications (+) PONV  Airway Mallampati: III  TM Distance: >3 FB Neck ROM: Full    Dental no notable dental hx. (+) Teeth Intact, Dental Advisory Given   Pulmonary COPD,  COPD inhaler, former smoker,    Pulmonary exam normal breath sounds clear to auscultation       Cardiovascular + dysrhythmias Atrial Fibrillation  Rhythm:Regular Rate:Normal     Neuro/Psych  Headaches, Anxiety Depression    GI/Hepatic Neg liver ROS, GERD  Medicated,  Endo/Other  Hyperthyroidism   Renal/GU negative Renal ROS  negative genitourinary   Musculoskeletal  (+) Arthritis , Osteoarthritis,    Abdominal   Peds  Hematology negative hematology ROS (+) anemia ,   Anesthesia Other Findings   Reproductive/Obstetrics negative OB ROS                            Anesthesia Physical Anesthesia Plan  ASA: II  Anesthesia Plan: General   Post-op Pain Management:    Induction: Intravenous  Airway Management Planned: Oral ETT  Additional Equipment:   Intra-op Plan:   Post-operative Plan: Extubation in OR  Informed Consent: I have reviewed the patients History and Physical, chart, labs and discussed the procedure including the risks, benefits and alternatives for the proposed anesthesia with the patient or authorized representative who has indicated his/her understanding and acceptance.   Dental advisory given  Plan Discussed with: CRNA  Anesthesia Plan Comments:         Anesthesia Quick Evaluation

## 2016-01-13 ENCOUNTER — Encounter (HOSPITAL_COMMUNITY): Payer: Self-pay | Admitting: *Deleted

## 2016-01-13 ENCOUNTER — Encounter (HOSPITAL_COMMUNITY)
Admission: RE | Disposition: A | Discharge status: Home / Self Care | Payer: Self-pay | Source: Ambulatory Visit | Attending: Neurosurgery

## 2016-01-13 ENCOUNTER — Ambulatory Visit (HOSPITAL_COMMUNITY): Payer: Medicare Other | Admitting: Anesthesiology

## 2016-01-13 ENCOUNTER — Ambulatory Visit (HOSPITAL_COMMUNITY): Payer: Medicare Other

## 2016-01-13 ENCOUNTER — Observation Stay (HOSPITAL_COMMUNITY)
Admission: RE | Admit: 2016-01-13 | Discharge: 2016-01-14 | Disposition: A | Discharge status: Home / Self Care | Payer: Medicare Other | Source: Ambulatory Visit | Attending: Neurosurgery | Admitting: Neurosurgery

## 2016-01-13 DIAGNOSIS — R51 Headache: Secondary | ICD-10-CM | POA: Diagnosis not present

## 2016-01-13 DIAGNOSIS — Z981 Arthrodesis status: Secondary | ICD-10-CM | POA: Insufficient documentation

## 2016-01-13 DIAGNOSIS — Z87891 Personal history of nicotine dependence: Secondary | ICD-10-CM | POA: Diagnosis not present

## 2016-01-13 DIAGNOSIS — F418 Other specified anxiety disorders: Secondary | ICD-10-CM | POA: Diagnosis not present

## 2016-01-13 DIAGNOSIS — G4700 Insomnia, unspecified: Secondary | ICD-10-CM | POA: Insufficient documentation

## 2016-01-13 DIAGNOSIS — M5124 Other intervertebral disc displacement, thoracic region: Secondary | ICD-10-CM | POA: Diagnosis present

## 2016-01-13 DIAGNOSIS — Z791 Long term (current) use of non-steroidal anti-inflammatories (NSAID): Secondary | ICD-10-CM | POA: Insufficient documentation

## 2016-01-13 DIAGNOSIS — Z7982 Long term (current) use of aspirin: Secondary | ICD-10-CM | POA: Insufficient documentation

## 2016-01-13 DIAGNOSIS — M4724 Other spondylosis with radiculopathy, thoracic region: Secondary | ICD-10-CM | POA: Diagnosis not present

## 2016-01-13 DIAGNOSIS — IMO0002 Reserved for concepts with insufficient information to code with codable children: Secondary | ICD-10-CM

## 2016-01-13 DIAGNOSIS — M5114 Intervertebral disc disorders with radiculopathy, thoracic region: Secondary | ICD-10-CM | POA: Diagnosis not present

## 2016-01-13 DIAGNOSIS — E7800 Pure hypercholesterolemia, unspecified: Secondary | ICD-10-CM | POA: Diagnosis not present

## 2016-01-13 DIAGNOSIS — J449 Chronic obstructive pulmonary disease, unspecified: Secondary | ICD-10-CM | POA: Insufficient documentation

## 2016-01-13 DIAGNOSIS — Z79899 Other long term (current) drug therapy: Secondary | ICD-10-CM | POA: Diagnosis not present

## 2016-01-13 DIAGNOSIS — Z85118 Personal history of other malignant neoplasm of bronchus and lung: Secondary | ICD-10-CM | POA: Diagnosis not present

## 2016-01-13 DIAGNOSIS — M47894 Other spondylosis, thoracic region: Secondary | ICD-10-CM | POA: Diagnosis not present

## 2016-01-13 DIAGNOSIS — M5134 Other intervertebral disc degeneration, thoracic region: Secondary | ICD-10-CM | POA: Diagnosis not present

## 2016-01-13 DIAGNOSIS — M5414 Radiculopathy, thoracic region: Secondary | ICD-10-CM | POA: Diagnosis not present

## 2016-01-13 HISTORY — PX: THORACIC DISCECTOMY: SHX6113

## 2016-01-13 HISTORY — DX: Pneumonia, unspecified organism: J18.9

## 2016-01-13 LAB — CBC
HEMATOCRIT: 39.1 % (ref 36.0–46.0)
Hemoglobin: 12.7 g/dL (ref 12.0–15.0)
MCH: 29.1 pg (ref 26.0–34.0)
MCHC: 32.5 g/dL (ref 30.0–36.0)
MCV: 89.5 fL (ref 78.0–100.0)
Platelets: 237 10*3/uL (ref 150–400)
RBC: 4.37 MIL/uL (ref 3.87–5.11)
RDW: 16.4 % — AB (ref 11.5–15.5)
WBC: 7.4 10*3/uL (ref 4.0–10.5)

## 2016-01-13 LAB — BASIC METABOLIC PANEL
Anion gap: 8 (ref 5–15)
BUN: 14 mg/dL (ref 6–20)
CHLORIDE: 107 mmol/L (ref 101–111)
CO2: 23 mmol/L (ref 22–32)
Calcium: 9.2 mg/dL (ref 8.9–10.3)
Creatinine, Ser: 0.88 mg/dL (ref 0.44–1.00)
GFR calc Af Amer: >60 mL/min (ref >60)
GFR calc non Af Amer: >60 mL/min (ref >60)
Glucose, Bld: 93 mg/dL (ref 65–99)
POTASSIUM: 4.3 mmol/L (ref 3.5–5.1)
SODIUM: 138 mmol/L (ref 135–145)

## 2016-01-13 LAB — SURGICAL PCR SCREEN
MRSA, PCR: NEGATIVE
Staphylococcus aureus: NEGATIVE

## 2016-01-13 SURGERY — THORACIC DISCECTOMY
Anesthesia: General | Laterality: Right

## 2016-01-13 MED ORDER — PHENYLEPHRINE HCL 10 MG/ML IJ SOLN
INTRAMUSCULAR | Status: DC | PRN
  Administered 2016-01-13: 80 ug via INTRAVENOUS

## 2016-01-13 MED ORDER — OXYCODONE-ACETAMINOPHEN 5-325 MG PO TABS: 1.0000 | ORAL_TABLET | ORAL | Status: DC | PRN

## 2016-01-13 MED ORDER — ALUM & MAG HYDROXIDE-SIMETH 200-200-20 MG/5ML PO SUSP
30.0000 mL | Freq: Four times a day (QID) | ORAL | Status: DC | PRN
  Administered 2016-01-14: 30 mL via ORAL
  Filled 2016-01-13: qty 30

## 2016-01-13 MED ORDER — ONDANSETRON HCL 4 MG/2ML IJ SOLN: 4.0000 mg | Freq: Four times a day (QID) | INTRAMUSCULAR | Status: DC | PRN

## 2016-01-13 MED ORDER — CHLORHEXIDINE GLUCONATE CLOTH 2 % EX PADS: 6.0000 | MEDICATED_PAD | Freq: Once | CUTANEOUS | Status: DC

## 2016-01-13 MED ORDER — THROMBIN 5000 UNITS EX SOLR
OROMUCOSAL | Status: DC | PRN
  Administered 2016-01-13: 08:00:00 via TOPICAL

## 2016-01-13 MED ORDER — CEFAZOLIN SODIUM 1 G IJ SOLR
INTRAMUSCULAR | Status: DC | PRN
  Administered 2016-01-13: 2 g via INTRAMUSCULAR

## 2016-01-13 MED ORDER — ROCURONIUM BROMIDE 10 MG/ML (PF) SYRINGE
PREFILLED_SYRINGE | INTRAVENOUS | Status: AC
  Filled 2016-01-13: qty 10

## 2016-01-13 MED ORDER — MIDAZOLAM HCL 2 MG/2ML IJ SOLN
INTRAMUSCULAR | Status: AC
Start: 2016-01-13 — End: 2016-01-13
  Filled 2016-01-13: qty 2

## 2016-01-13 MED ORDER — THROMBIN 5000 UNITS EX SOLR
CUTANEOUS | Status: DC | PRN
  Administered 2016-01-13 (×2): 5000 [IU] via TOPICAL

## 2016-01-13 MED ORDER — MUPIROCIN 2 % EX OINT
1.0000 "application " | TOPICAL_OINTMENT | Freq: Once | CUTANEOUS | Status: AC
  Administered 2016-01-13: 1 via TOPICAL

## 2016-01-13 MED ORDER — VITAMIN D 1000 UNITS PO TABS
2000.0000 [IU] | ORAL_TABLET | Freq: Every day | ORAL | Status: DC
  Filled 2016-01-13: qty 2

## 2016-01-13 MED ORDER — SODIUM CHLORIDE 0.9% FLUSH
3.0000 mL | Freq: Two times a day (BID) | INTRAVENOUS | Status: DC
  Administered 2016-01-13: 3 mL via INTRAVENOUS

## 2016-01-13 MED ORDER — ACETAMINOPHEN 10 MG/ML IV SOLN
INTRAVENOUS | Status: DC | PRN
  Administered 2016-01-13: 1000 mg via INTRAVENOUS

## 2016-01-13 MED ORDER — SUGAMMADEX SODIUM 200 MG/2ML IV SOLN
INTRAVENOUS | Status: AC
  Filled 2016-01-13: qty 2

## 2016-01-13 MED ORDER — ATORVASTATIN CALCIUM 20 MG PO TABS
20.0000 mg | ORAL_TABLET | Freq: Every day | ORAL | Status: DC
  Administered 2016-01-13: 20 mg via ORAL
  Filled 2016-01-13: qty 1

## 2016-01-13 MED ORDER — SODIUM CHLORIDE 0.9 % IR SOLN
Status: DC | PRN
  Administered 2016-01-13: 08:00:00

## 2016-01-13 MED ORDER — ALBUTEROL SULFATE (2.5 MG/3ML) 0.083% IN NEBU
INHALATION_SOLUTION | RESPIRATORY_TRACT | Status: AC
  Filled 2016-01-13: qty 3

## 2016-01-13 MED ORDER — ONDANSETRON HCL 4 MG/2ML IJ SOLN
INTRAMUSCULAR | Status: AC
  Filled 2016-01-13: qty 2

## 2016-01-13 MED ORDER — CYCLOBENZAPRINE HCL 10 MG PO TABS
10.0000 mg | ORAL_TABLET | Freq: Three times a day (TID) | ORAL | Status: DC | PRN
  Administered 2016-01-13: 10 mg via ORAL
  Filled 2016-01-13: qty 1

## 2016-01-13 MED ORDER — LACTATED RINGERS IV SOLN
INTRAVENOUS | Status: DC | PRN
  Administered 2016-01-13 (×2): via INTRAVENOUS

## 2016-01-13 MED ORDER — HYDROCODONE-ACETAMINOPHEN 5-325 MG PO TABS
1.0000 | ORAL_TABLET | ORAL | Status: DC | PRN
  Administered 2016-01-13 – 2016-01-14 (×3): 2 via ORAL
  Filled 2016-01-13 (×4): qty 2

## 2016-01-13 MED ORDER — KETOROLAC TROMETHAMINE 30 MG/ML IJ SOLN
30.0000 mg | Freq: Once | INTRAMUSCULAR | Status: AC
  Administered 2016-01-13: 30 mg via INTRAVENOUS

## 2016-01-13 MED ORDER — ZOLPIDEM TARTRATE 10 MG PO TABS: 10.0000 mg | ORAL_TABLET | Freq: Every day | ORAL | Status: DC

## 2016-01-13 MED ORDER — SODIUM CHLORIDE 0.9 % IV SOLN
8.0000 mg | Freq: Four times a day (QID) | INTRAVENOUS | Status: DC | PRN
  Filled 2016-01-13: qty 4

## 2016-01-13 MED ORDER — FENTANYL CITRATE (PF) 100 MCG/2ML IJ SOLN
INTRAMUSCULAR | Status: AC
  Filled 2016-01-13: qty 4

## 2016-01-13 MED ORDER — INFLUENZA VAC SPLIT QUAD 0.5 ML IM SUSY: 0.5000 mL | PREFILLED_SYRINGE | INTRAMUSCULAR | Status: DC

## 2016-01-13 MED ORDER — SODIUM CHLORIDE 0.9 % IV SOLN: 250.0000 mL | INTRAVENOUS | Status: DC

## 2016-01-13 MED ORDER — ALBUTEROL SULFATE (2.5 MG/3ML) 0.083% IN NEBU
2.5000 mg | INHALATION_SOLUTION | Freq: Four times a day (QID) | RESPIRATORY_TRACT | Status: DC | PRN
  Administered 2016-01-13: 2.5 mg via RESPIRATORY_TRACT

## 2016-01-13 MED ORDER — SODIUM CHLORIDE 0.9% FLUSH: 3.0000 mL | INTRAVENOUS | Status: DC | PRN

## 2016-01-13 MED ORDER — ZOLPIDEM TARTRATE 5 MG PO TABS: 5.0000 mg | ORAL_TABLET | Freq: Every evening | ORAL | Status: DC | PRN

## 2016-01-13 MED ORDER — ZOLPIDEM TARTRATE 5 MG PO TABS
5.0000 mg | ORAL_TABLET | Freq: Every day | ORAL | Status: DC
  Administered 2016-01-13: 5 mg via ORAL
  Filled 2016-01-13: qty 1

## 2016-01-13 MED ORDER — KETOROLAC TROMETHAMINE 30 MG/ML IJ SOLN
30.0000 mg | Freq: Four times a day (QID) | INTRAMUSCULAR | Status: DC
  Administered 2016-01-13 – 2016-01-14 (×4): 30 mg via INTRAVENOUS
  Filled 2016-01-13 (×4): qty 1

## 2016-01-13 MED ORDER — MORPHINE SULFATE (PF) 4 MG/ML IV SOLN: 4.0000 mg | INTRAVENOUS | Status: DC | PRN

## 2016-01-13 MED ORDER — LIDOCAINE 2% (20 MG/ML) 5 ML SYRINGE
INTRAMUSCULAR | Status: AC
  Filled 2016-01-13: qty 5

## 2016-01-13 MED ORDER — KCL IN DEXTROSE-NACL 20-5-0.45 MEQ/L-%-% IV SOLN
INTRAVENOUS | Status: AC
  Filled 2016-01-13: qty 1000

## 2016-01-13 MED ORDER — LIDOCAINE HCL (CARDIAC) 20 MG/ML IV SOLN
INTRAVENOUS | Status: DC | PRN
  Administered 2016-01-13: 60 mg via INTRAVENOUS

## 2016-01-13 MED ORDER — FENTANYL CITRATE (PF) 100 MCG/2ML IJ SOLN
INTRAMUSCULAR | Status: DC | PRN
  Administered 2016-01-13 (×8): 50 ug via INTRAVENOUS

## 2016-01-13 MED ORDER — BUPIVACAINE HCL (PF) 0.5 % IJ SOLN
INTRAMUSCULAR | Status: DC | PRN
Start: 2016-01-13 — End: 2016-01-13
  Administered 2016-01-13: 10 mL

## 2016-01-13 MED ORDER — ALBUTEROL SULFATE (2.5 MG/3ML) 0.083% IN NEBU: 2.5000 mg | INHALATION_SOLUTION | Freq: Four times a day (QID) | RESPIRATORY_TRACT | Status: DC | PRN

## 2016-01-13 MED ORDER — PHENYLEPHRINE HCL 10 MG/ML IJ SOLN
INTRAVENOUS | Status: DC | PRN
  Administered 2016-01-13: 20 ug/min via INTRAVENOUS

## 2016-01-13 MED ORDER — METHOCARBAMOL 500 MG PO TABS
500.0000 mg | ORAL_TABLET | Freq: Four times a day (QID) | ORAL | Status: DC | PRN
  Filled 2016-01-13: qty 1

## 2016-01-13 MED ORDER — ONDANSETRON HCL 4 MG PO TABS: 4.0000 mg | ORAL_TABLET | Freq: Four times a day (QID) | ORAL | Status: DC | PRN

## 2016-01-13 MED ORDER — BISACODYL 10 MG RE SUPP: 10.0000 mg | Freq: Every day | RECTAL | Status: DC | PRN

## 2016-01-13 MED ORDER — ROCURONIUM BROMIDE 100 MG/10ML IV SOLN
INTRAVENOUS | Status: DC | PRN
  Administered 2016-01-13: 10 mg via INTRAVENOUS
  Administered 2016-01-13: 50 mg via INTRAVENOUS

## 2016-01-13 MED ORDER — HEMOSTATIC AGENTS (NO CHARGE) OPTIME
TOPICAL | Status: DC | PRN
  Administered 2016-01-13: 1 via TOPICAL

## 2016-01-13 MED ORDER — HYDROXYZINE HCL 50 MG/ML IM SOLN: 50.0000 mg | INTRAMUSCULAR | Status: DC | PRN

## 2016-01-13 MED ORDER — LORAZEPAM 0.5 MG PO TABS
0.5000 mg | ORAL_TABLET | Freq: Every day | ORAL | Status: DC | PRN
  Administered 2016-01-13: 0.5 mg via ORAL
  Filled 2016-01-13: qty 1

## 2016-01-13 MED ORDER — MIDAZOLAM HCL 5 MG/5ML IJ SOLN
INTRAMUSCULAR | Status: DC | PRN
  Administered 2016-01-13: 2 mg via INTRAVENOUS

## 2016-01-13 MED ORDER — DILTIAZEM HCL ER COATED BEADS 120 MG PO CP24
120.0000 mg | ORAL_CAPSULE | Freq: Every day | ORAL | Status: DC
  Administered 2016-01-13 – 2016-01-14 (×2): 120 mg via ORAL
  Filled 2016-01-13 (×2): qty 1

## 2016-01-13 MED ORDER — DEXAMETHASONE SODIUM PHOSPHATE 10 MG/ML IJ SOLN
INTRAMUSCULAR | Status: DC | PRN
  Administered 2016-01-13: 10 mg via INTRAVENOUS

## 2016-01-13 MED ORDER — ACETAMINOPHEN 10 MG/ML IV SOLN
INTRAVENOUS | Status: AC
  Filled 2016-01-13: qty 100

## 2016-01-13 MED ORDER — HYDROMORPHONE HCL 1 MG/ML IJ SOLN: 0.2500 mg | INTRAMUSCULAR | Status: DC | PRN

## 2016-01-13 MED ORDER — ACETAMINOPHEN 650 MG RE SUPP: 650.0000 mg | RECTAL | Status: DC | PRN

## 2016-01-13 MED ORDER — CARVEDILOL 3.125 MG PO TABS
3.1250 mg | ORAL_TABLET | Freq: Two times a day (BID) | ORAL | Status: DC
  Administered 2016-01-13 – 2016-01-14 (×2): 3.125 mg via ORAL
  Filled 2016-01-13 (×2): qty 1

## 2016-01-13 MED ORDER — DEXAMETHASONE SODIUM PHOSPHATE 10 MG/ML IJ SOLN
INTRAMUSCULAR | Status: AC
  Filled 2016-01-13: qty 1

## 2016-01-13 MED ORDER — HYDROXYZINE HCL 25 MG PO TABS: 50.0000 mg | ORAL_TABLET | ORAL | Status: DC | PRN

## 2016-01-13 MED ORDER — KCL IN DEXTROSE-NACL 20-5-0.45 MEQ/L-%-% IV SOLN
INTRAVENOUS | Status: DC
  Administered 2016-01-13: 12:00:00 via INTRAVENOUS

## 2016-01-13 MED ORDER — ONDANSETRON HCL 4 MG/2ML IJ SOLN
4.0000 mg | Freq: Four times a day (QID) | INTRAMUSCULAR | Status: DC | PRN
  Administered 2016-01-14: 4 mg via INTRAVENOUS
  Filled 2016-01-13: qty 2

## 2016-01-13 MED ORDER — MAGNESIUM HYDROXIDE 400 MG/5ML PO SUSP: 30.0000 mL | Freq: Every day | ORAL | Status: DC | PRN

## 2016-01-13 MED ORDER — CARVEDILOL 3.125 MG PO TABS
3.1250 mg | ORAL_TABLET | Freq: Once | ORAL | Status: AC
  Administered 2016-01-13: 3.125 mg via ORAL
  Filled 2016-01-13 (×2): qty 1

## 2016-01-13 MED ORDER — GLYCOPYRROLATE 0.2 MG/ML IJ SOLN
INTRAMUSCULAR | Status: DC | PRN
  Administered 2016-01-13: 0.2 mg via INTRAVENOUS

## 2016-01-13 MED ORDER — LIDOCAINE-EPINEPHRINE 1 %-1:100000 IJ SOLN
INTRAMUSCULAR | Status: DC | PRN
  Administered 2016-01-13: 10 mL

## 2016-01-13 MED ORDER — PHENOL 1.4 % MT LIQD: 1.0000 | OROMUCOSAL | Status: DC | PRN

## 2016-01-13 MED ORDER — PROPOFOL 10 MG/ML IV BOLUS
INTRAVENOUS | Status: AC
  Filled 2016-01-13: qty 20

## 2016-01-13 MED ORDER — KETOROLAC TROMETHAMINE 30 MG/ML IJ SOLN
INTRAMUSCULAR | Status: AC
  Filled 2016-01-13: qty 1

## 2016-01-13 MED ORDER — ONDANSETRON HCL 4 MG/2ML IJ SOLN
INTRAMUSCULAR | Status: DC | PRN
  Administered 2016-01-13: 4 mg via INTRAVENOUS

## 2016-01-13 MED ORDER — MUPIROCIN 2 % EX OINT
1.0000 "application " | TOPICAL_OINTMENT | Freq: Once | CUTANEOUS | Status: DC
  Filled 2016-01-13: qty 22

## 2016-01-13 MED ORDER — SUGAMMADEX SODIUM 200 MG/2ML IV SOLN
INTRAVENOUS | Status: DC | PRN
  Administered 2016-01-13: 150 mg via INTRAVENOUS

## 2016-01-13 MED ORDER — SUCCINYLCHOLINE CHLORIDE 20 MG/ML IJ SOLN
INTRAMUSCULAR | Status: DC | PRN
  Administered 2016-01-13: 60 mg via INTRAVENOUS
  Administered 2016-01-13: 50 mg via INTRAVENOUS

## 2016-01-13 MED ORDER — CEFAZOLIN SODIUM-DEXTROSE 2-4 GM/100ML-% IV SOLN
2.0000 g | INTRAVENOUS | Status: DC
  Filled 2016-01-13: qty 100

## 2016-01-13 MED ORDER — PROPOFOL 10 MG/ML IV BOLUS
INTRAVENOUS | Status: DC | PRN
  Administered 2016-01-13: 170 mg via INTRAVENOUS

## 2016-01-13 MED ORDER — ACETAMINOPHEN 325 MG PO TABS: 650.0000 mg | ORAL_TABLET | ORAL | Status: DC | PRN

## 2016-01-13 MED ORDER — ESCITALOPRAM OXALATE 20 MG PO TABS
20.0000 mg | ORAL_TABLET | Freq: Every day | ORAL | Status: DC
  Administered 2016-01-13: 20 mg via ORAL
  Filled 2016-01-13 (×2): qty 1

## 2016-01-13 MED ORDER — MENTHOL 3 MG MT LOZG: 1.0000 | LOZENGE | OROMUCOSAL | Status: DC | PRN

## 2016-01-13 SURGICAL SUPPLY — 51 items
ADH SKN CLS APL DERMABOND .7 (GAUZE/BANDAGES/DRESSINGS) ×1
APL SKNCLS STERI-STRIP NONHPOA (GAUZE/BANDAGES/DRESSINGS) ×1
BAG DECANTER FOR FLEXI CONT (MISCELLANEOUS) ×2 IMPLANT
BENZOIN TINCTURE PRP APPL 2/3 (GAUZE/BANDAGES/DRESSINGS) ×1 IMPLANT
BIT DRILL NEURO 2X3.1 SFT TUCH (MISCELLANEOUS) ×1 IMPLANT
BLADE CLIPPER SURG (BLADE) IMPLANT
BUR ROUND FLUTED 5 RND (BURR) ×2 IMPLANT
CANISTER SUCT 3000ML PPV (MISCELLANEOUS) ×2 IMPLANT
DERMABOND ADVANCED (GAUZE/BANDAGES/DRESSINGS) ×1
DERMABOND ADVANCED .7 DNX12 (GAUZE/BANDAGES/DRESSINGS) ×1 IMPLANT
DRAPE LAPAROTOMY 100X72X124 (DRAPES) ×2 IMPLANT
DRAPE MICROSCOPE LEICA (MISCELLANEOUS) ×2 IMPLANT
DRAPE POUCH INSTRU U-SHP 10X18 (DRAPES) ×3 IMPLANT
DRILL NEURO 2X3.1 SOFT TOUCH (MISCELLANEOUS) ×2
ELECT REM PT RETURN 9FT ADLT (ELECTROSURGICAL) ×2
ELECTRODE REM PT RTRN 9FT ADLT (ELECTROSURGICAL) ×1 IMPLANT
GAUZE SPONGE 4X4 12PLY STRL (GAUZE/BANDAGES/DRESSINGS) IMPLANT
GAUZE SPONGE 4X4 16PLY XRAY LF (GAUZE/BANDAGES/DRESSINGS) IMPLANT
GLOVE BIOGEL PI IND STRL 8 (GLOVE) ×1 IMPLANT
GLOVE BIOGEL PI INDICATOR 8 (GLOVE) ×1
GLOVE ECLIPSE 7.5 STRL STRAW (GLOVE) ×7 IMPLANT
GLOVE EXAM NITRILE LRG STRL (GLOVE) IMPLANT
GLOVE EXAM NITRILE XL STR (GLOVE) IMPLANT
GLOVE EXAM NITRILE XS STR PU (GLOVE) IMPLANT
GLOVE INDICATOR 8.0 STRL GRN (GLOVE) ×2 IMPLANT
GOWN STRL REUS W/ TWL LRG LVL3 (GOWN DISPOSABLE) IMPLANT
GOWN STRL REUS W/ TWL XL LVL3 (GOWN DISPOSABLE) IMPLANT
GOWN STRL REUS W/TWL 2XL LVL3 (GOWN DISPOSABLE) IMPLANT
GOWN STRL REUS W/TWL LRG LVL3 (GOWN DISPOSABLE)
GOWN STRL REUS W/TWL XL LVL3 (GOWN DISPOSABLE)
HEMOSTAT POWDER KIT SURGIFOAM (HEMOSTASIS) ×1 IMPLANT
KIT BASIN OR (CUSTOM PROCEDURE TRAY) ×2 IMPLANT
KIT ROOM TURNOVER OR (KITS) ×2 IMPLANT
NDL SPNL 18GX3.5 QUINCKE PK (NEEDLE) IMPLANT
NDL SPNL 22GX3.5 QUINCKE BK (NEEDLE) ×1 IMPLANT
NEEDLE SPNL 18GX3.5 QUINCKE PK (NEEDLE) ×2 IMPLANT
NEEDLE SPNL 22GX3.5 QUINCKE BK (NEEDLE) ×2 IMPLANT
NS IRRIG 1000ML POUR BTL (IV SOLUTION) ×2 IMPLANT
PACK LAMINECTOMY NEURO (CUSTOM PROCEDURE TRAY) ×2 IMPLANT
PAD ARMBOARD 7.5X6 YLW CONV (MISCELLANEOUS) ×6 IMPLANT
SPONGE SURGIFOAM ABS GEL SZ50 (HEMOSTASIS) IMPLANT
STAPLER SKIN PROX WIDE 3.9 (STAPLE) IMPLANT
STRIP CLOSURE SKIN 1/2X4 (GAUZE/BANDAGES/DRESSINGS) IMPLANT
SUT VIC AB 0 CT1 18XCR BRD8 (SUTURE) ×1 IMPLANT
SUT VIC AB 0 CT1 8-18 (SUTURE) ×2
SUT VIC AB 2-0 CP2 18 (SUTURE) ×1 IMPLANT
SUT VIC AB 2-0 CT1 18 (SUTURE) ×1 IMPLANT
SUT VIC AB 3-0 SH 8-18 (SUTURE) ×2 IMPLANT
TOWEL OR 17X24 6PK STRL BLUE (TOWEL DISPOSABLE) ×2 IMPLANT
TOWEL OR 17X26 10 PK STRL BLUE (TOWEL DISPOSABLE) ×2 IMPLANT
WATER STERILE IRR 1000ML POUR (IV SOLUTION) ×2 IMPLANT

## 2016-01-13 NOTE — Addendum Note (Signed)
Addendum  created 01/13/16 1433 by Jenne Campus, CRNA   Anesthesia Attestations filed

## 2016-01-13 NOTE — Anesthesia Postprocedure Evaluation (Signed)
Anesthesia Post Note  Patient: Andrea Morgan  Procedure(s) Performed: Procedure(s) (LRB): RIGHT THORACIC ONE THORACIC TWO THORACIC LAMINOTOMY AND MICRODISCECTOMY (Right)  Patient location during evaluation: PACU Anesthesia Type: General Level of consciousness: awake and alert Pain management: pain level controlled Vital Signs Assessment: post-procedure vital signs reviewed and stable Respiratory status: spontaneous breathing, nonlabored ventilation, respiratory function stable and patient connected to nasal cannula oxygen Cardiovascular status: blood pressure returned to baseline and stable Postop Assessment: no signs of nausea or vomiting Anesthetic complications: no    Last Vitals:  Vitals:   01/13/16 1142 01/13/16 1157  BP: 132/90 139/90  Pulse:  76  Resp:  18  Temp:  36.5 C    Last Pain:  Vitals:   01/13/16 1139  TempSrc:   PainSc: 0-No pain                 Zylan Almquist,W. EDMOND

## 2016-01-13 NOTE — Progress Notes (Signed)
Vitals:   01/13/16 1139 01/13/16 1142 01/13/16 1157 01/13/16 1614  BP:  132/90 139/90 (!) 149/75  Pulse: 75  76 81  Resp: '18  18 20  '$ Temp: 97.3 F (36.3 C)  97.7 F (36.5 C) 98 F (36.7 C)  TempSrc:      SpO2: 98%  98% 93%  Weight:      Height:        CBC  Recent Labs  01/13/16 0710  WBC 7.4  HGB 12.7  HCT 39.1  PLT 237   BMET  Recent Labs  01/13/16 0710  NA 138  K 4.3  CL 107  CO2 23  GLUCOSE 93  BUN 14  CREATININE 0.88  CALCIUM 9.2    Patient resting comfortably in bed, has ambulated with the staff. Has voided. Incision clean and dry. Excellent relief of right thoracic radicular pain.  Plan: Doing well following surgery, encouraged to ambulate, we'll continue to progress through postoperative recovery.  Hosie Spangle, MD 01/13/2016, 6:27 PM

## 2016-01-13 NOTE — H&P (Signed)
Subjective: Patient is a 65 y.o. 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 Patient's symptoms began 6 weeks ago without any particular inciting cause. She had pain at the base of her neck and upper back, extending along the medial aspect of the right scapula as well as around the right scapula. It then extended down to the right shoulder arm and forearm. She had numbness and tingling in the right shoulder, arm, forearm, and hand, extending to the digits of the right hand. MRI scan of the cervical spine showed multilevel cervical spondylosis and degenerative disc disease. Well-healed fusions at the C5-6 and C6-7 levels, but was most notable for a large right T1 to thoracic disc herniation, with mass effect on the thecal sac, displacement of the spinal cord from right to left, but no alteration of cord signal was seen. The disc herniation extends rostrally behind the body of T1 on the right side. Patient is admitted now for right T1-2 thoracic laminotomy, possible transpedicular approach, and microdiscectomy.   Patient Active Problem List   Diagnosis Date Noted  . Chest pain 08/28/2014  . Chronic headaches   . Anxiety   . Cephalalgia   . Hypercholesterolemia 03/11/2014  . Atrial fibrillation with RVR - post-operative  11/12/2013  . Personal history of colonic polyps 11/08/2013  . PAF (paroxysmal atrial fibrillation) ([place]) 10/17/2013  . COPD (chronic obstructive pulmonary disease) ([place]) 04/26/2013  . 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 Chest mass 05/04/2011   Past Medical History:  Diagnosis Date  . Allergic rhinitis   . Anemia   . Anxiety    experienced panic attacks at home after her last surgery  . Arthritis    arthritis left jaw / pain    . 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 Chronic headaches   . COPD (chronic  obstructive pulmonary disease) ([place])   . 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 Depression   . Emphysema of lung ([place])   . GERD (gastroesophageal reflux disease)    "no longer have this "  . H/O hiatal hernia   . History of kidney stones   . History of kidney stones   . Hyperlipidemia   . Hyperthyroidism   . Insomnia   . PAF (paroxysmal atrial fibrillation) ([place]) 10/2013   Post-op Afib.  . Pneumonia    2015 or 2016  . PONV (postoperative nausea and vomiting)    "usually do"  . Shortness of breath    with exercsie  . Stiffness in joint    neck - s/p cervical fusion    Past Surgical History:  Procedure Laterality Date  . APPENDECTOMY  1978  . CERVICAL FUSION  1997  . CERVICAL FUSION    . COLONOSCOPY    . ESOPHAGOGASTRODUODENOSCOPY  05/21/2011   Procedure: ESOPHAGOGASTRODUODENOSCOPY (EGD);  Surgeon: Beryle Beams, MD;  Location: [facility];  Service: Endoscopy;  Laterality: N/A;  . EXAMINATION UNDER ANESTHESIA N/A 11/08/2013   Procedure: ANAL EXAM UNDER ANESTHESIA WITH BIOPSY;  Surgeon: [name], MD;  Location: [facility];  Service: General;  Laterality: N/A;  . LAPAROSCOPIC PARTIAL COLECTOMY N/A 11/08/2013   Procedure: laparoscopic partial right colectomy;  Surgeon: [name], MD;  Location: [facility];  Service: General;  Laterality: N/A;  . MASS EXCISION  06/02/2011   Procedure: EXCISION MASS;  Surgeon: Grace Isaac, MD;  Location: [facility];  Service: Thoracic;;  . TONSILLECTOMY  1971  . TRANSTHORACIC ECHOCARDIOGRAM  11/12/2013   Normal LV size & function, EF 55-60%.  No valvular lesions or WMA.  Marland Kitchen VIDEO BRONCHOSCOPY  05/11/2011   Procedure: VIDEO BRONCHOSCOPY WITH FLUORO;  Surgeon: Collene Gobble, MD;  Location: [facility];  Service: Cardiopulmonary;  Laterality: Bilateral;  . VIDEO BRONCHOSCOPY  06/02/2011   Procedure: VIDEO BRONCHOSCOPY;  Surgeon: Grace Isaac, MD;  Location: [facility] OR;  Service: Thoracic;  Laterality: N/A;    Prescriptions Prior to Admission   Medication Sig Dispense Refill Last Dose  . albuterol (PROVENTIL HFA;VENTOLIN HFA) 108 (90 BASE) MCG/ACT inhaler Inhale 1 puff into the lungs every 6 (six) hours as needed for wheezing or shortness of breath.   unknown  . aspirin EC 81 MG tablet Take 81 mg by mouth daily.   12/19/2015 at 0800  . atorvastatin (LIPITOR) 20 MG tablet Take 20 mg by mouth at bedtime.    12/18/2015 at Unknown time  . carvedilol (COREG) 3.125 MG tablet Take 3.125 mg by mouth 2 (two) times daily with a meal.   12/19/2015 at 0800  . Cholecalciferol (VITAMIN D) 2000 units tablet Take 2,000 Units by mouth daily.   12/19/2015 at Unknown time  . diltiazem (CARDIZEM CD) 120 MG 24 hr capsule Take 1 capsule (120 mg total) by mouth daily. 30 capsule 11 12/19/2015 at Unknown time  . escitalopram (LEXAPRO) 20 MG tablet Take 20 mg by mouth daily.  3 12/19/2015 at Unknown time  . HYDROcodone-acetaminophen (NORCO/VICODIN) 5-325 MG tablet Take 1 tablet by mouth every 6 (six) hours as needed. 12 tablet 0   . levothyroxine (SYNTHROID, LEVOTHROID) 50 MCG tablet Take 50 mcg by mouth daily.  3 12/19/2015 at Unknown time  . LORazepam (ATIVAN) 1 MG tablet Take 1 mg by mouth daily as needed for anxiety.   3 unknown  . meloxicam (MOBIC) 15 MG tablet Take 15 mg by mouth daily.  1   . methocarbamol (ROBAXIN) 500 MG tablet Take 500 mg by mouth every 6 (six) hours as needed for muscle spasms.   12/18/2015 at Unknown time  . naproxen sodium (ANAPROX) 220 MG tablet Take 440 mg by mouth every 12 (twelve) hours as needed (pain).   12/19/2015 at Unknown time  . vitamin B-12 (CYANOCOBALAMIN) 1000 MCG tablet Take 3,000 mcg by mouth daily.   12/19/2015 at Unknown time  . zolpidem (AMBIEN) 10 MG tablet Take 10 mg by mouth at bedtime.    12/18/2015 at Unknown time   Allergies  Allergen Reactions  . Oxycodone     Itching     Pt states she does ok with hydrocodone  . Sulfa Antibiotics Hives    Social History  Substance Use Topics  . Smoking status: Former Smoker     Packs/day: 1.00    Years: 45.00    Types: Cigarettes    Quit date: 04/14/2011  . Smokeless tobacco: Never Used  . Alcohol use No    Family History  Problem Relation Age of Onset  . Asthma Mother   . Heart disease Mother   . Lung cancer Father   . Asthma Maternal Grandmother   . Ovarian cancer Maternal Aunt   . Breast cancer Maternal Aunt     x2  . Prostate cancer Maternal Uncle   . Kidney cancer Sister   . Colon cancer Brother   . Anesthesia problems Neg Hx  Review of Systems A comprehensive review of systems was negative.  Objective:   EXAM: Patient is a well-developed well-nourished white female in no acute distress. Lungs are clear to auscultation , the patient has symmetrical respiratory excursion. Heart has a regular rate and rhythm normal S1 and S2 no murmur.   Abdomen is soft nontender nondistended bowel sounds are present. Extremity examination shows no clubbing cyanosis or edema. Motor examination shows 5 over 5 strength in the upper extremities including the deltoid biceps triceps and intrinsics and grip bilaterally, as well as in the proximal lower extremities including the iliopsoas and quadriceps bilaterally.. Sensation is intact to pinprick throughout the digits of the upper extremities. Reflexes are symmetrical and without evidence of pathologic reflexes. Patient has a normal gait and stance.   Data Review:CBC    Component Value Date/Time   WBC 7.1 08/27/2014 2235   RBC 4.43 08/27/2014 2235   HGB 11.0 (L) 08/27/2014 2235   HCT 33.9 (L) 08/27/2014 2235   PLT 294 08/27/2014 2235   MCV 76.5 (L) 08/27/2014 2235   MCH 24.8 (L) 08/27/2014 2235   MCHC 32.4 08/27/2014 2235   RDW 18.8 (H) 08/27/2014 2235   LYMPHSABS 1.2 11/05/2012 1350   MONOABS 0.6 11/05/2012 1350   EOSABS 0.1 11/05/2012 1350   BASOSABS 0.0 11/05/2012 1350                          BMET    Component Value Date/Time   NA 136 08/27/2014 2235   K 3.5 08/27/2014 2235   CL 105 08/27/2014  2235   CO2 21 (L) 08/27/2014 2235   GLUCOSE 125 (H) 08/27/2014 2235   BUN 15 08/27/2014 2235   CREATININE 0.69 08/27/2014 2235   CREATININE 0.71 04/23/2013 1443   CALCIUM 8.8 (L) 08/27/2014 2235   GFRNONAA >60 08/27/2014 2235   GFRAA >60 08/27/2014 2235     Assessment/Plan: Patient presenting with a right thoracic radiculopathy secondary to a large right T1 to thoracic disc condition, who is admitted now for right thoracic laminectomy, possible transpedicular approach, and microdiscectomy.  I've discussed with the patient the nature of his condition, the nature the surgical procedure, the typical length of surgery, hospital stay, and overall recuperation. We discussed limitations postoperatively. I discussed risks of surgery including risks of infection, bleeding, possibly need for transfusion, the risk of nerve root dysfunction with pain, weakness, numbness, or paresthesias, the risk of spinal cord dysfunction with paralysis of all 4 limbs and quadriplegia, and the risk of dural tear and CSF leakage and possible need for further surgery, and the risk of anesthetic complications including myocardial infarction, stroke, pneumonia, and death. We also discussed the need for postoperative immobilization in a cervical collar. Understanding all this the patient does wish to proceed with surgery and is admitted for such.    Hosie Spangle, MD 01/13/2016 6:21 AM

## 2016-01-13 NOTE — Op Note (Signed)
01/13/2016  10:26 AM  PATIENT:  Andrea Morgan  65 y.o. female  PRE-OPERATIVE DIAGNOSIS:  Right T1-2 thoracic disc herniation, right T1 thoracic radiculopathy, thoracic degenerative disease, thoracic spondylosis  POST-OPERATIVE DIAGNOSIS:  Right T1-2 thoracic disc herniation, right T1 thoracic radiculopathy, thoracic degenerative disease, thoracic spondylosis  PROCEDURE:  Procedure(s):  Right T1-2 thoracic laminotomy and foraminotomy, transpedicular approach via the right T2 pedicle, and right T1-2 thoracic microdiscectomy with micro-section, microsurgical technique, and the operating microscope  SURGEON:  Surgeon(s): Jovita Gamma, MD Consuella Lose, MD  ASSISTANTS:  Consuella Lose, MD  ANESTHESIA:   general  EBL:  Total I/O In: 1000 [I.V.:1000] Out: 125 [Blood:125]  BLOOD ADMINISTERED:none  COUNT: Correct per nursing staff  DICTATION: Patient was brought to the operating room, placed under general endotracheal anesthesia.  Radiolucent 3 pin Mayfield head holderwas applied, and the patient was turned to a prone position. The posterior neck and upper back were prepped with Betadine soap and solution and draped in a sterile fashion. The midline was infiltrated with local anesthetic with epinephrine.  The C-arm fluoroscope was used throughout the procedure to guide localization. The T1-2 level was identified.  A midline incision was made over the T1-2 level, and carried down through the subcutaneous tissue. Bipolar cautery and electrocautery used to maintain hemostasis.  Posterior thoracic fascia was incised on the right side, and the parathoracic musculature was dissected from the spinous processes and lamina in a subperiosteal fashion. Self-retaining retractor was placed. Using the C-arm fluoroscope we identified the T1-2 level.  The operating microscope was draped in a sterile fashion, and brought in the field to provide additional magnification, illumination, and  visualization. Using the high-speed drill and Kerrison punches with thin footplates a right C1-2 laminotomy was performed. We identified the thecal sac and identified the exiting right T1 nerve root. Foraminotomy was performed for the exiting right T1 nerve root. And then using the high-speed drill dissected down the right T2 pedicle to allow exposure ventral to the thecal sac and right T1 nerve root.  The ventral epidural space was carefully explored, gently mobilizing the thecal sac and right T1 nerve root. The free fragment disc herniation was identified, and removed in a piecemeal fashion. As this was performed the thecal sac and nerve root became progressively decompressed. The epidural space was thoroughly explored, and all free fragment disc was removed, and good decompression of the neural elements was achieved.  We then established hemostasis using bone wax for bony surfaces, bipolar cautery, Gelfoam with thrombin, and Surgifoam. Once good hemostasis was established we proceeded with closure.  Deep fascia was closed with interrupted undyed 0 Vicryl sutures. Scarpa's fascia was closed with interrupted undyed 0 Vicryl sutures. The subcutaneous and subcuticular closed with interrupted inverted 2-0 undyed Vicryl sutures.  Skin edges were closed with Dermabond. Following surgery the patient was turned back to a supine position, the 3 pin Mayfield head holder was removed, and the patient is to be reversed from the anesthetic, extubated, and transferred to the recovery room for further care.  PLAN OF CARE: Admit for overnight observation  PATIENT DISPOSITION:  PACU - hemodynamically stable.   Delay start of Pharmacological VTE agent (>24hrs) due to surgical blood loss or risk of bleeding:  yes

## 2016-01-13 NOTE — Transfer of Care (Signed)
Immediate Anesthesia Transfer of Care Note  Patient: Andrea Morgan  Procedure(s) Performed: Procedure(s): RIGHT THORACIC ONE THORACIC TWO THORACIC LAMINOTOMY AND MICRODISCECTOMY (Right)  Patient Location: PACU  Anesthesia Type:General  Level of Consciousness: awake, oriented and patient cooperative  Airway & Oxygen Therapy: Patient Spontanous Breathing and Patient connected to face mask oxygen  Post-op Assessment: Report given to RN, Post -op Vital signs reviewed and stable and Patient moving all extremities X 4  Post vital signs: Reviewed and stable  Last Vitals:  Vitals:   01/13/16 0633 01/13/16 1054  BP: 132/66   Pulse: 67   Resp: 18   Temp: 36.7 C 36.3 C    Last Pain:  Vitals:   01/13/16 0707  TempSrc:   PainSc: 8       Patients Stated Pain Goal: 2 (40/34/74 2595)  Complications: No apparent anesthesia complications

## 2016-01-13 NOTE — Anesthesia Procedure Notes (Signed)
Procedure Name: Intubation Date/Time: 01/13/2016 7:53 AM Performed by: Lance Coon Pre-anesthesia Checklist: Emergency Drugs available, Patient identified, Suction available, Patient being monitored and Timeout performed Patient Re-evaluated:Patient Re-evaluated prior to inductionOxygen Delivery Method: Circle system utilized Preoxygenation: Pre-oxygenation with 100% oxygen Intubation Type: IV induction Ventilation: Mask ventilation without difficulty Laryngoscope Size: Glidescope and 4 Grade View: Grade II Tube type: Oral Tube size: 7.5 mm Number of attempts: 1 Airway Equipment and Method: Stylet Placement Confirmation: ETT inserted through vocal cords under direct vision,  positive ETCO2 and breath sounds checked- equal and bilateral Secured at: 22 cm Tube secured with: Tape Dental Injury: Teeth and Oropharynx as per pre-operative assessment

## 2016-01-14 ENCOUNTER — Encounter (HOSPITAL_COMMUNITY): Payer: Self-pay | Admitting: Neurosurgery

## 2016-01-14 DIAGNOSIS — F418 Other specified anxiety disorders: Secondary | ICD-10-CM | POA: Diagnosis not present

## 2016-01-14 DIAGNOSIS — J449 Chronic obstructive pulmonary disease, unspecified: Secondary | ICD-10-CM | POA: Diagnosis not present

## 2016-01-14 DIAGNOSIS — M4724 Other spondylosis with radiculopathy, thoracic region: Secondary | ICD-10-CM | POA: Diagnosis not present

## 2016-01-14 DIAGNOSIS — E7800 Pure hypercholesterolemia, unspecified: Secondary | ICD-10-CM | POA: Diagnosis not present

## 2016-01-14 DIAGNOSIS — G4700 Insomnia, unspecified: Secondary | ICD-10-CM | POA: Diagnosis not present

## 2016-01-14 DIAGNOSIS — M5114 Intervertebral disc disorders with radiculopathy, thoracic region: Secondary | ICD-10-CM | POA: Diagnosis not present

## 2016-01-14 MED ORDER — HYDROCODONE-ACETAMINOPHEN 5-325 MG PO TABS: 1.0000 | ORAL_TABLET | Freq: Four times a day (QID) | ORAL | 0 refills | Status: DC | PRN

## 2016-01-14 NOTE — Discharge Summary (Signed)
Physician Discharge Summary  Patient ID: Andrea Morgan MRN: [id number] DOB/AGE: [birth year] 65 y.o.  Admit date: 01/13/2016 Discharge date: 01/14/2016  Admission Diagnoses: Right T1-2 thoracic disc herniation, right T1 thoracic radiculopathy, thoracic degenerative disease, thoracic spondylosis   Discharge Diagnoses: Right T1-2 thoracic disc herniation, right T1 thoracic radiculopathy, thoracic degenerative disease, thoracic spondylosis Active Problems:   Thoracic disc herniation   Discharged Condition: good  Hospital Course: Patient was admitted, underwent a right T1-2 microdiscectomy. Postoperative she done well. She's had excellent relief of her right thoracic radiculopathy. She is up and ambulate actively. She is voiding well. Her incision is healing nicely. We are discharging her to home with instructions regarding wound care activities. She is scheduled to return for follow-up with me in about 3 weeks.  Discharge Exam: Blood pressure 125/68, pulse 64, temperature 98.8 F (37.1 C), temperature source Oral, resp. rate 18, height 5' 6.5" (1.689 m), weight 73.9 kg (163 lb), SpO2 97 %.  Disposition: 01-Home or Self Care     Medication List    TAKE these medications   albuterol 108 (90 Base) MCG/ACT inhaler Commonly known as:  PROVENTIL HFA;VENTOLIN HFA Inhale 1 puff into the lungs every 6 (six) hours as needed for wheezing or shortness of breath.   aspirin EC 81 MG tablet Take 81 mg by mouth daily.   atorvastatin 20 MG tablet Commonly known as:  LIPITOR Take 20 mg by mouth at bedtime.   carvedilol 3.125 MG tablet Commonly known as:  COREG Take 3.125 mg by mouth 2 (two) times daily with a meal.   diltiazem 120 MG 24 hr capsule Commonly known as:  CARDIZEM CD Take 1 capsule (120 mg total) by mouth daily.   escitalopram 20 MG tablet Commonly known as:  LEXAPRO Take 20 mg by mouth daily.   HYDROcodone-acetaminophen 5-325 MG tablet Commonly known as:   NORCO/VICODIN Take 1 tablet by mouth every 6 (six) hours as needed. What changed:  Another medication with the same name was added. Make sure you understand how and when to take each.   HYDROcodone-acetaminophen 5-325 MG tablet Commonly known as:  NORCO/VICODIN Take 1-2 tablets by mouth every 6 (six) hours as needed (pain). What changed:  You were already taking a medication with the same name, and this prescription was added. Make sure you understand how and when to take each.   levothyroxine 50 MCG tablet Commonly known as:  SYNTHROID, LEVOTHROID Take 50 mcg by mouth daily.   LORazepam 1 MG tablet Commonly known as:  ATIVAN Take 1 mg by mouth daily as needed for anxiety.   meloxicam 15 MG tablet Commonly known as:  MOBIC Take 15 mg by mouth daily.   methocarbamol 500 MG tablet Commonly known as:  ROBAXIN Take 500 mg by mouth every 6 (six) hours as needed for muscle spasms.   naproxen sodium 220 MG tablet Commonly known as:  ANAPROX Take 440 mg by mouth every 12 (twelve) hours as needed (pain).   vitamin B-12 1000 MCG tablet Commonly known as:  CYANOCOBALAMIN Take 3,000 mcg by mouth daily.   Vitamin D 2000 units tablet Take 2,000 Units by mouth daily.   zolpidem 10 MG tablet Commonly known as:  AMBIEN Take 10 mg by mouth at bedtime.        SignedHosie Spangle 01/14/2016, 5:53 PM

## 2016-01-14 NOTE — Discharge Instructions (Signed)

## 2016-01-14 NOTE — Progress Notes (Signed)
Pt doing well. Pt and daughter given D/C instructions with Rx's, verbal understanding was provided. Pt's Incision is open to air and has no sign of infection. Pt's IV was removed prior to D/C. Pt D/C'd home via walking @ 1805 per MD order. Pt is stable @ D/C and has no other needs at this time. Holli Humbles, RN

## 2016-03-02 DIAGNOSIS — Z6826 Body mass index (BMI) 26.0-26.9, adult: Secondary | ICD-10-CM | POA: Diagnosis not present

## 2016-03-02 DIAGNOSIS — R05 Cough: Secondary | ICD-10-CM | POA: Diagnosis not present

## 2016-03-02 DIAGNOSIS — J449 Chronic obstructive pulmonary disease, unspecified: Secondary | ICD-10-CM | POA: Diagnosis not present

## 2016-03-02 DIAGNOSIS — J209 Acute bronchitis, unspecified: Secondary | ICD-10-CM | POA: Diagnosis not present

## 2016-04-13 ENCOUNTER — Other Ambulatory Visit: Payer: Self-pay | Admitting: General Surgery

## 2016-04-13 DIAGNOSIS — A630 Anogenital (venereal) warts: Secondary | ICD-10-CM | POA: Diagnosis not present

## 2016-04-13 NOTE — H&P (Signed)
History of Present Illness [name] MD; 71/69/6789 10:55 AM) The patient is a 65 year old female who presents with anal lesions. This patient was referred by Dr Hilarie Fredrickson for evaluation of an anal canal lesion last year.  She is one year status post a right hemicolectomy and excisional biopsy of an anal lesion. The lesion was thought to be condylomatous on pathology. She underwent a 1 year follow-up colonoscopy with Dr. Hilarie Fredrickson in 2016. This showed another polypoid lesion in her anal canal. She was examined last year and the lesion was small and she elected to watch the area. She is here today one year later to have this evaluated again. She states she is having regular bowel movements and denies any rectal bleeding.   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 Tonsillectomy  Diagnostic Studies History [name], MD; 82/42/3536 10:39 AM) Colonoscopy within last year Mammogram within last year  Allergies Nance Pear, [place]; 04/13/2016 10:24 AM) OxyCODONE HCl ER *ANALGESICS - OPIOID* Sulfa 10 *OPHTHALMIC AGENTS*  Medication History Nance Pear, CMA; 04/13/2016 10:26 AM) LORazepam (0.'5MG'$  Tablet, Oral) Active. Atorvastatin Calcium ('20MG'$  Tablet, Oral) Active. Zolpidem Tartrate ('10MG'$  Tablet, Oral) Active. Escitalopram Oxalate ('10MG'$  Tablet, Oral) Active. Aspirin Low Strength ('81MG'$  Tablet Chewable, Oral) Active. Pepcid AC Maximum Strength ('20MG'$  Tablet, Oral) Active. Meloxicam ('15MG'$  Tablet, Oral daily) Active. LORazepam ('1MG'$  Tablet, Oral daily) Active. Carvedilol (3.'125MG'$  Tablet, Oral daily) Active. DilTIAZem HCl ER Coated Beads ('120MG'$  Capsule ER 24HR, Oral daily) Active. Escitalopram Oxalate ('20MG'$  Tablet, Oral daily) Active. Methocarbamol ('500MG'$  Tablet, Oral daily)  Active. Cyanocobalamin (1000MCG Capsule, Oral daily) Active. Medications Reconciled  Social History [name], MD; 14/43/1540 10:39 AM) Alcohol use Remotely quit alcohol use. Illicit drug use Uses weekly. No caffeine use Tobacco use Former smoker.  Family History [name], MD; 08/67/6195 10:39 AM) Alcohol Abuse Brother, Father, Sister. Colon Cancer Brother. Depression Brother, Daughter, Father, Mother. Diabetes Mellitus Brother. Hypertension Mother, Sister. Kidney Disease Sister. Migraine Headache Sister. Respiratory Condition Mother, Sister.  Pregnancy / Birth History [name], MD; 09/32/6712 10:39 AM) Age at menarche 61 years. Age of menopause >51 Gravida 2 Maternal age 4-25 Para 70  Other Problems [name], MD; 45/80/9983 10:39 AM) Depression Gastroesophageal Reflux Disease Kidney Stone Lung Cancer H/O CONDYLOMA ACUMINATUM [phone])     Review of Systems [name] MD; 53/97/6734 10:39 AM) General Not Present- Appetite Loss, Chills, Fatigue, Fever, Night Sweats, Weight Gain and Weight Loss. Skin Not Present- Change in Wart/Mole, Dryness, Hives, Jaundice, New Lesions, Non-Healing Wounds, Rash and Ulcer. HEENT Present- Ringing in the Ears and Wears glasses/contact lenses. Not Present- Earache, Hearing Loss, Hoarseness, Nose Bleed, Oral Ulcers, Seasonal Allergies, Sinus Pain, Sore Throat, Visual Disturbances and Yellow Eyes. Respiratory Not Present- Bloody sputum, Chronic Cough, Difficulty Breathing, Snoring and Wheezing. Cardiovascular Present- Leg Cramps, Palpitations and Shortness of Breath. Not Present- Chest Pain, Difficulty Breathing Lying Down, Rapid Heart Rate and Swelling of Extremities. Gastrointestinal Not Present- Abdominal Pain, Bloating, Bloody Stool, Change in Bowel Habits, Chronic diarrhea, Constipation, Difficulty Swallowing, Excessive gas, Gets full quickly at meals, Hemorrhoids, Indigestion, Nausea, Rectal  Pain and Vomiting. Female Genitourinary Not Present- Frequency, Nocturia, Painful Urination, Pelvic Pain and Urgency. Neurological Not Present- Decreased Memory, Fainting, Headaches, Numbness, Seizures, Tingling, Tremor, Trouble walking and Weakness. Psychiatric Present- Anxiety and Depression. Not Present- Bipolar, Change in Sleep Pattern, Fearful and Frequent crying. Endocrine  Present- Hot flashes. Not Present- Cold Intolerance, Excessive Hunger, Hair Changes, Heat Intolerance and New Diabetes. Hematology Present- Easy Bruising. Not Present- Excessive bleeding, Gland problems, HIV and Persistent Infections.  Vitals Bary Castilla Bradford CMA; 04/13/2016 10:26 AM) 04/13/2016 10:26 AM Weight: 166 lb Height: 66in Body Surface Area: 1.85 m Body Mass Index: 26.79 kg/m  Temp.: 98.73F  Pulse: 87 (Regular)  BP: 112/78 (Sitting, Left Arm, Standard)      Physical Exam [name] MD; 28/97/9150 10:39 AM)  General Mental Status-Alert. General Appearance-Consistent with stated age. Hydration-Well hydrated. Voice-Normal.  Eye Eyeball - Bilateral-Extraocular movements intact. Sclera/Conjunctiva - Bilateral-No scleral icterus.  Chest and Lung Exam Chest and lung exam reveals -quiet, even and easy respiratory effort with no use of accessory muscles and on auscultation, normal breath sounds, no adventitious sounds and normal vocal resonance. Inspection Chest Wall - Normal. Back - normal.  Cardiovascular Cardiovascular examination reveals -normal heart sounds, regular rate and rhythm with no murmurs and normal pedal pulses bilaterally.  Abdomen Inspection Inspection of the abdomen reveals - No Hernias. Palpation/Percussion Palpation and Percussion of the abdomen reveal - Soft, Non Tender, No Rebound tenderness, No Rigidity (guarding) and No hepatosplenomegaly. Auscultation Auscultation of the abdomen reveals - Bowel sounds normal.  Rectal Anorectal  Exam External - Note: No external lesions noted. Internal - Note: Small anterior polypoid lesion palpated.  Neurologic Neurologic evaluation reveals -alert and oriented x 3 with no impairment of recent or remote memory. Mental Status-Normal.  Musculoskeletal Global Assessment -Note:no gross deformities.  Normal Exam - Left-Upper Extremity Strength Normal and Lower Extremity Strength Normal. Normal Exam - Right-Upper Extremity Strength Normal and Lower Extremity Strength Normal.   Results [name] MD; 41/36/4383 10:40 AM) Procedures  Name Value Date ANOSCOPY, DIAGNOSTIC (77939) [ Hemorrhoids ] Procedure Other: Procedure: Anoscopy Surgeon: Marcello Moores After the risks and benefits were explained, verbal consent was obtained for above procedure. A medical assistant chaperone was present thoroughout the entire procedure. 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 This was seen on physical exam. We discussed excision versus repeat anoscopy and surveillance in the office. She would like to proceed with excision. We will get this scheduled at her convenience. Risks discussed with patient including bleeding, pain and recurrence.

## 2016-05-04 ENCOUNTER — Encounter (HOSPITAL_BASED_OUTPATIENT_CLINIC_OR_DEPARTMENT_OTHER): Payer: Self-pay | Admitting: *Deleted

## 2016-05-04 NOTE — Progress Notes (Signed)
NPO AFTER MN W/ EXCEPTION CLEAR LIQUIDS UNTIL 0800 (NO CREAM/  MILK PRODUCTS).  ARRIVE AT 0379.  NEEDS HG.  CURRENT EKG AND CXR IN CHART AND EPIC.  WILL TAKE AM MEDS W/ SIPS OF WATER DOS.

## 2016-05-06 ENCOUNTER — Encounter (HOSPITAL_BASED_OUTPATIENT_CLINIC_OR_DEPARTMENT_OTHER)
Admission: RE | Disposition: A | Discharge status: Home / Self Care | Payer: Self-pay | Source: Ambulatory Visit | Attending: General Surgery

## 2016-05-06 ENCOUNTER — Encounter (HOSPITAL_BASED_OUTPATIENT_CLINIC_OR_DEPARTMENT_OTHER): Payer: Self-pay | Admitting: Anesthesiology

## 2016-05-06 ENCOUNTER — Ambulatory Visit (HOSPITAL_BASED_OUTPATIENT_CLINIC_OR_DEPARTMENT_OTHER): Payer: Medicare Other | Admitting: Anesthesiology

## 2016-05-06 ENCOUNTER — Ambulatory Visit (HOSPITAL_BASED_OUTPATIENT_CLINIC_OR_DEPARTMENT_OTHER)
Admission: RE | Admit: 2016-05-06 | Discharge: 2016-05-06 | Disposition: A | Discharge status: Home / Self Care | Payer: Medicare Other | Source: Ambulatory Visit | Attending: General Surgery | Admitting: General Surgery

## 2016-05-06 DIAGNOSIS — Z87891 Personal history of nicotine dependence: Secondary | ICD-10-CM | POA: Diagnosis not present

## 2016-05-06 DIAGNOSIS — Z9049 Acquired absence of other specified parts of digestive tract: Secondary | ICD-10-CM | POA: Insufficient documentation

## 2016-05-06 DIAGNOSIS — F419 Anxiety disorder, unspecified: Secondary | ICD-10-CM | POA: Diagnosis not present

## 2016-05-06 DIAGNOSIS — Z800 Family history of malignant neoplasm of digestive organs: Secondary | ICD-10-CM | POA: Insufficient documentation

## 2016-05-06 DIAGNOSIS — Z79899 Other long term (current) drug therapy: Secondary | ICD-10-CM | POA: Insufficient documentation

## 2016-05-06 DIAGNOSIS — F329 Major depressive disorder, single episode, unspecified: Secondary | ICD-10-CM | POA: Insufficient documentation

## 2016-05-06 DIAGNOSIS — K219 Gastro-esophageal reflux disease without esophagitis: Secondary | ICD-10-CM | POA: Diagnosis not present

## 2016-05-06 DIAGNOSIS — J449 Chronic obstructive pulmonary disease, unspecified: Secondary | ICD-10-CM | POA: Insufficient documentation

## 2016-05-06 DIAGNOSIS — Z85118 Personal history of other malignant neoplasm of bronchus and lung: Secondary | ICD-10-CM | POA: Insufficient documentation

## 2016-05-06 DIAGNOSIS — Z7982 Long term (current) use of aspirin: Secondary | ICD-10-CM | POA: Insufficient documentation

## 2016-05-06 DIAGNOSIS — A630 Anogenital (venereal) warts: Secondary | ICD-10-CM | POA: Insufficient documentation

## 2016-05-06 DIAGNOSIS — E039 Hypothyroidism, unspecified: Secondary | ICD-10-CM | POA: Insufficient documentation

## 2016-05-06 DIAGNOSIS — Z791 Long term (current) use of non-steroidal anti-inflammatories (NSAID): Secondary | ICD-10-CM | POA: Insufficient documentation

## 2016-05-06 HISTORY — DX: Hypothyroidism, unspecified: E03.9

## 2016-05-06 HISTORY — DX: Diaphragmatic hernia without obstruction or gangrene: K44.9

## 2016-05-06 HISTORY — DX: Personal history of other mental and behavioral disorders: Z86.59

## 2016-05-06 HISTORY — DX: Personal history of colonic polyps: Z86.010

## 2016-05-06 HISTORY — DX: Personal history of adenomatous and serrated colon polyps: Z86.0101

## 2016-05-06 HISTORY — DX: Dyspnea, unspecified: R06.00

## 2016-05-06 HISTORY — DX: Benign neoplasm of unspecified adrenal gland: D35.00

## 2016-05-06 HISTORY — DX: Other forms of dyspnea: R06.09

## 2016-05-06 HISTORY — DX: Personal history of other infectious and parasitic diseases: Z86.19

## 2016-05-06 HISTORY — DX: Emphysema, unspecified: J43.9

## 2016-05-06 HISTORY — DX: Other intervertebral disc degeneration, thoracic region: M51.34

## 2016-05-06 HISTORY — DX: Anogenital (venereal) warts: A63.0

## 2016-05-06 HISTORY — DX: Personal history of other malignant neoplasm of bronchus and lung: Z85.118

## 2016-05-06 HISTORY — PX: MASS EXCISION: SHX2000

## 2016-05-06 HISTORY — DX: Other specified diseases of liver: K76.89

## 2016-05-06 HISTORY — DX: Solitary pulmonary nodule: R91.1

## 2016-05-06 LAB — POCT HEMOGLOBIN-HEMACUE: HEMOGLOBIN: 14.5 g/dL (ref 12.0–15.0)

## 2016-05-06 SURGERY — EXAM UNDER ANESTHESIA
Anesthesia: Monitor Anesthesia Care | Site: Rectum

## 2016-05-06 MED ORDER — SODIUM CHLORIDE 0.9 % IV SOLN
250.0000 mL | INTRAVENOUS | Status: DC | PRN
  Filled 2016-05-06: qty 250

## 2016-05-06 MED ORDER — PROPOFOL 500 MG/50ML IV EMUL
INTRAVENOUS | Status: DC | PRN
Start: 2016-05-06 — End: 2016-05-06
  Administered 2016-05-06: 150 ug/kg/min via INTRAVENOUS

## 2016-05-06 MED ORDER — BUPIVACAINE LIPOSOME 1.3 % IJ SUSP
INTRAMUSCULAR | Status: AC
  Filled 2016-05-06: qty 20

## 2016-05-06 MED ORDER — ACETAMINOPHEN 650 MG RE SUPP
650.0000 mg | RECTAL | Status: DC | PRN
  Filled 2016-05-06: qty 1

## 2016-05-06 MED ORDER — BUPIVACAINE-EPINEPHRINE 0.5% -1:200000 IJ SOLN
INTRAMUSCULAR | Status: DC | PRN
  Administered 2016-05-06: 30 mL

## 2016-05-06 MED ORDER — ACETIC ACID 5 % SOLN
Status: AC
  Filled 2016-05-06: qty 500

## 2016-05-06 MED ORDER — FENTANYL CITRATE (PF) 100 MCG/2ML IJ SOLN
25.0000 ug | INTRAMUSCULAR | Status: DC | PRN
Start: 2016-05-06 — End: 2016-05-06
  Filled 2016-05-06: qty 1

## 2016-05-06 MED ORDER — HYALURONIDASE HUMAN 150 UNIT/ML IJ SOLN
INTRAMUSCULAR | Status: AC
  Filled 2016-05-06: qty 1

## 2016-05-06 MED ORDER — ACETIC ACID 5 % SOLN
Status: DC | PRN
  Administered 2016-05-06: 1 via TOPICAL

## 2016-05-06 MED ORDER — ACETAMINOPHEN 325 MG PO TABS
650.0000 mg | ORAL_TABLET | ORAL | Status: DC | PRN
  Filled 2016-05-06: qty 2

## 2016-05-06 MED ORDER — HYDROCODONE-ACETAMINOPHEN 5-325 MG PO TABS: 1.0000 | ORAL_TABLET | ORAL | 0 refills | Status: DC | PRN

## 2016-05-06 MED ORDER — ONDANSETRON HCL 4 MG/2ML IJ SOLN
INTRAMUSCULAR | Status: DC | PRN
  Administered 2016-05-06: 4 mg via INTRAVENOUS

## 2016-05-06 MED ORDER — EPINEPHRINE PF 1 MG/ML IJ SOLN
INTRAMUSCULAR | Status: DC | PRN
  Administered 2016-05-06: .15 mL

## 2016-05-06 MED ORDER — FENTANYL CITRATE (PF) 100 MCG/2ML IJ SOLN
INTRAMUSCULAR | Status: DC | PRN
  Administered 2016-05-06 (×2): 25 ug via INTRAVENOUS

## 2016-05-06 MED ORDER — MIDAZOLAM HCL 2 MG/2ML IJ SOLN
INTRAMUSCULAR | Status: AC
  Filled 2016-05-06: qty 2

## 2016-05-06 MED ORDER — EPINEPHRINE PF 1 MG/ML IJ SOLN
INTRAMUSCULAR | Status: AC
  Filled 2016-05-06: qty 1

## 2016-05-06 MED ORDER — LIDOCAINE 5 % EX OINT
TOPICAL_OINTMENT | CUTANEOUS | Status: DC | PRN
  Administered 2016-05-06: 1

## 2016-05-06 MED ORDER — BUPIVACAINE HCL (PF) 0.5 % IJ SOLN
INTRAMUSCULAR | Status: AC
  Filled 2016-05-06: qty 30

## 2016-05-06 MED ORDER — SODIUM CHLORIDE 0.9% FLUSH
3.0000 mL | Freq: Two times a day (BID) | INTRAVENOUS | Status: DC
  Filled 2016-05-06: qty 3

## 2016-05-06 MED ORDER — LIDOCAINE 2% (20 MG/ML) 5 ML SYRINGE
INTRAMUSCULAR | Status: AC
  Filled 2016-05-06: qty 5

## 2016-05-06 MED ORDER — FENTANYL CITRATE (PF) 100 MCG/2ML IJ SOLN
INTRAMUSCULAR | Status: AC
  Filled 2016-05-06: qty 2

## 2016-05-06 MED ORDER — LIDOCAINE 5 % EX OINT
TOPICAL_OINTMENT | CUTANEOUS | Status: AC
  Filled 2016-05-06: qty 35.44

## 2016-05-06 MED ORDER — LIDOCAINE HCL (CARDIAC) 20 MG/ML IV SOLN
INTRAVENOUS | Status: DC | PRN
  Administered 2016-05-06: 50 mg via INTRAVENOUS

## 2016-05-06 MED ORDER — LACTATED RINGERS IV SOLN
INTRAVENOUS | Status: DC
  Administered 2016-05-06: 14:00:00 via INTRAVENOUS
  Filled 2016-05-06: qty 1000

## 2016-05-06 MED ORDER — SODIUM CHLORIDE 0.9% FLUSH
3.0000 mL | INTRAVENOUS | Status: DC | PRN
  Filled 2016-05-06: qty 3

## 2016-05-06 MED ORDER — PROPOFOL 500 MG/50ML IV EMUL
INTRAVENOUS | Status: AC
  Filled 2016-05-06: qty 50

## 2016-05-06 MED ORDER — MIDAZOLAM HCL 5 MG/5ML IJ SOLN
INTRAMUSCULAR | Status: DC | PRN
  Administered 2016-05-06: 2 mg via INTRAVENOUS

## 2016-05-06 MED ORDER — PROMETHAZINE HCL 25 MG/ML IJ SOLN
6.2500 mg | INTRAMUSCULAR | Status: DC | PRN
  Filled 2016-05-06: qty 1

## 2016-05-06 MED ORDER — ONDANSETRON HCL 4 MG/2ML IJ SOLN
INTRAMUSCULAR | Status: AC
  Filled 2016-05-06: qty 2

## 2016-05-06 SURGICAL SUPPLY — 66 items
ADH SKN CLS APL DERMABOND .7 (GAUZE/BANDAGES/DRESSINGS)
APL SKNCLS STERI-STRIP NONHPOA (GAUZE/BANDAGES/DRESSINGS) ×1
BENZOIN TINCTURE PRP APPL 2/3 (GAUZE/BANDAGES/DRESSINGS) ×3 IMPLANT
BLADE CLIPPER SURG (BLADE) IMPLANT
BLADE HEX COATED 2.75 (ELECTRODE) ×3 IMPLANT
BLADE SURG 10 STRL SS (BLADE) ×3 IMPLANT
BLADE SURG 15 STRL LF DISP TIS (BLADE) ×1 IMPLANT
BLADE SURG 15 STRL SS (BLADE) ×3
BRIEF STRETCH FOR OB PAD LRG (UNDERPADS AND DIAPERS) ×3 IMPLANT
CANISTER SUCTION 2500CC (MISCELLANEOUS) ×3 IMPLANT
CHLORAPREP W/TINT 26ML (MISCELLANEOUS) ×1 IMPLANT
CLOSURE WOUND 1/2 X4 (GAUZE/BANDAGES/DRESSINGS)
COVER BACK TABLE 60X90IN (DRAPES) ×3 IMPLANT
COVER MAYO STAND STRL (DRAPES) ×3 IMPLANT
DECANTER SPIKE VIAL GLASS SM (MISCELLANEOUS) ×1 IMPLANT
DERMABOND ADVANCED (GAUZE/BANDAGES/DRESSINGS)
DERMABOND ADVANCED .7 DNX12 (GAUZE/BANDAGES/DRESSINGS) IMPLANT
DRAPE LAPAROTOMY 100X72 PEDS (DRAPES) ×3 IMPLANT
DRAPE LG THREE QUARTER DISP (DRAPES) ×2 IMPLANT
DRAPE UNDERBUTTOCKS STRL (DRAPE) IMPLANT
DRAPE UTILITY XL STRL (DRAPES) ×3 IMPLANT
DRSG TEGADERM 4X4.75 (GAUZE/BANDAGES/DRESSINGS) IMPLANT
ELECT BLADE 6.5 .24CM SHAFT (ELECTRODE) IMPLANT
ELECT REM PT RETURN 9FT ADLT (ELECTROSURGICAL) ×3
ELECTRODE REM PT RTRN 9FT ADLT (ELECTROSURGICAL) ×1 IMPLANT
GAUZE SPONGE 4X4 16PLY XRAY LF (GAUZE/BANDAGES/DRESSINGS) ×1 IMPLANT
GLOVE BIO SURGEON STRL SZ 6.5 (GLOVE) ×3 IMPLANT
GLOVE BIO SURGEONS STRL SZ 6.5 (GLOVE) ×1
GLOVE INDICATOR 7.0 STRL GRN (GLOVE) ×3 IMPLANT
GOWN STRL REUS W/ TWL XL LVL3 (GOWN DISPOSABLE) ×2 IMPLANT
GOWN STRL REUS W/TWL 2XL LVL3 (GOWN DISPOSABLE) ×3 IMPLANT
GOWN STRL REUS W/TWL XL LVL3 (GOWN DISPOSABLE) ×3
HEMOSTAT SURGICEL 2X14 (HEMOSTASIS) ×1 IMPLANT
KIT ROOM TURNOVER WOR (KITS) ×3 IMPLANT
LEGGING LITHOTOMY PAIR STRL (DRAPES) IMPLANT
LOOP VESSEL MAXI BLUE (MISCELLANEOUS) IMPLANT
NEEDLE HYPO 22GX1.5 SAFETY (NEEDLE) ×3 IMPLANT
NS IRRIG 500ML POUR BTL (IV SOLUTION) ×3 IMPLANT
PACK BASIN DAY SURGERY FS (CUSTOM PROCEDURE TRAY) ×3 IMPLANT
PAD ABD 8X10 STRL (GAUZE/BANDAGES/DRESSINGS) ×2 IMPLANT
PAD ARMBOARD 7.5X6 YLW CONV (MISCELLANEOUS) ×3 IMPLANT
PENCIL BUTTON HOLSTER BLD 10FT (ELECTRODE) ×3 IMPLANT
SPONGE GAUZE 4X4 12PLY STER LF (GAUZE/BANDAGES/DRESSINGS) ×3 IMPLANT
SPONGE SURGIFOAM ABS GEL 12-7 (HEMOSTASIS) IMPLANT
STRIP CLOSURE SKIN 1/2X4 (GAUZE/BANDAGES/DRESSINGS) IMPLANT
SUT CHROMIC 2 0 SH (SUTURE) IMPLANT
SUT CHROMIC 3 0 SH 27 (SUTURE) ×2 IMPLANT
SUT ETHIBOND 0 (SUTURE) IMPLANT
SUT ETHILON 2 0 FS 18 (SUTURE) IMPLANT
SUT ETHILON 4 0 PS 2 18 (SUTURE) IMPLANT
SUT SILK 2 0 SH (SUTURE) IMPLANT
SUT VIC AB 2-0 SH 27 (SUTURE)
SUT VIC AB 2-0 SH 27XBRD (SUTURE) IMPLANT
SUT VIC AB 3-0 SH 18 (SUTURE) IMPLANT
SUT VIC AB 4-0 SH 18 (SUTURE) IMPLANT
SUT VICRYL 4-0 PS2 18IN ABS (SUTURE) IMPLANT
SYR BULB IRRIGATION 50ML (SYRINGE) ×3 IMPLANT
SYR CONTROL 10ML LL (SYRINGE) ×3 IMPLANT
TOWEL OR 17X24 6PK STRL BLUE (TOWEL DISPOSABLE) ×6 IMPLANT
TRAY DSU PREP LF (CUSTOM PROCEDURE TRAY) ×3 IMPLANT
TUBE ANAEROBIC SPECIMEN COL (MISCELLANEOUS) IMPLANT
TUBE CONNECTING 12'X1/4 (SUCTIONS) ×1
TUBE CONNECTING 12X1/4 (SUCTIONS) ×2 IMPLANT
UNDERPAD 30X30 INCONTINENT (UNDERPADS AND DIAPERS) ×2 IMPLANT
WATER STERILE IRR 500ML POUR (IV SOLUTION) ×1 IMPLANT
YANKAUER SUCT BULB TIP NO VENT (SUCTIONS) ×3 IMPLANT

## 2016-05-06 NOTE — Op Note (Signed)
05/06/2016  2:40 PM  PATIENT:  Andrea Morgan  66 y.o. female  Patient Care Team: [name], MD as PCP - General (Internal Medicine) Collene Gobble, MD as Consulting Physician (Pulmonary Disease)  PRE-OPERATIVE DIAGNOSIS:  Anal condyloma  POST-OPERATIVE DIAGNOSIS:  Anal condyloma  PROCEDURE:  EXAM UNDER ANESTHESIA EXCISION ANAL CONDYLOMA   Surgeon(s): [name], MD  ASSISTANT: none   ANESTHESIA:   local  SPECIMEN:  Source of Specimen:  anterior anal canal lesion  DISPOSITION OF SPECIMEN:  PATHOLOGY  COUNTS:  YES  PLAN OF CARE: Discharge to home after PACU  PATIENT DISPOSITION:  PACU - hemodynamically stable.  INDICATION: 66 y.o. F with h/o anal condyloma.  She has an anterior lesion that we have been watching in the office.  She is ready to get this excised.    OR FINDINGS: anterior anal canal polypoid lesion  DESCRIPTION: the patient was identified in the preoperative holding area and taken to the OR where they were laid on the operating room table.  MAC anesthesia was induced without difficulty. The patient was then positioned in prone jackknife position with buttocks gently taped apart.  The patient was then prepped and draped in usual sterile fashion.  SCDs were noted to be in place prior to the initiation of anesthesia. A surgical timeout was performed indicating the correct patient, procedure, positioning and need for preoperative antibiotics.  A rectal block was performed using Marcaine with epinephrine.    I began with a digital rectal exam.  The mass could be palpated at anterior midline.  I then placed a Hill-Ferguson anoscope into the anal canal and evaluated this completely.  The mesentery to be 2-3 mm in size. I placed an acetic acid soaked sponge into the anal canal. This was left to sit for approximately 2 minutes. I then evaluated the entire anal canal. There were no other active lesions noted. I removed the anterior lesion using Metzenbaum  scissors. The mucosa was reapproximated using a running 3-0 running suture. Hemostasis was good. The patient tolerated the procedure well and sent to the postanesthesia care unit in stable condition. All counts were correct per operating room staff.

## 2016-05-06 NOTE — Transfer of Care (Signed)
Last Vitals:  Vitals:   05/06/16 1300  BP: (!) 135/57  Pulse: 74  Resp: 16  Temp: 36.4 C    Last Pain:  Vitals:   05/06/16 1300  TempSrc: Oral      Patients Stated Pain Goal: 4 (05/06/16 1314)  Immediate Anesthesia Transfer of Care Note  Patient: Andrea Morgan  Procedure(s) Performed: Procedure(s) (LRB): EXAM UNDER ANESTHESIA (N/A) EXCISION ANAL CONDYLOMA (N/A)  Patient Location: PACU  Anesthesia Type: General  Level of Consciousness: awake, alert  and oriented  Airway & Oxygen Therapy: Patient Spontanous Breathing and Patient connected to face mask oxygen  Post-op Assessment: Report given to PACU RN and Post -op Vital signs reviewed and stable  Post vital signs: Reviewed and stable  Complications: No apparent anesthesia complications

## 2016-05-06 NOTE — Discharge Instructions (Addendum)
Beginning the day after surgery: ° °You may sit in a tub of warm water 2-3 times a day to relieve discomfort. ° °Eat a regular diet high in fiber.  Avoid foods that give you constipation or diarrhea.  Avoid foods that are difficult to digest, such as seeds, nuts, corn or popcorn. ° °Do not go any longer than 2 days without a bowel movement.  You may take a dose of Milk of Magnesia if you become constipated.   ° °Drink 6-8 glasses of water daily. ° °Walking is encouraged.  Avoid strenuous activity and heavy lifting for one month after surgery.   ° °Call the office if you have any questions or concerns.  Call immediately if you develop: ° °· Excessive rectal bleeding (more than a cup or passing large clots) °· Increased discomfort °· Fever greater than 100 F °· Difficulty urinating ° °Post Anesthesia Home Care Instructions ° °Activity: °Get plenty of rest for the remainder of the day. A responsible adult should stay with you for 24 hours following the procedure.  °For the next 24 hours, DO NOT: °-Drive a car °-Operate machinery °-Drink alcoholic beverages °-Take any medication unless instructed by your physician °-Make any legal decisions or sign important papers. ° °Meals: °Start with liquid foods such as gelatin or soup. Progress to regular foods as tolerated. Avoid greasy, spicy, heavy foods. If nausea and/or vomiting occur, drink only clear liquids until the nausea and/or vomiting subsides. Call your physician if vomiting continues. ° °Special Instructions/Symptoms: °Your throat may feel dry or sore from the anesthesia or the breathing tube placed in your throat during surgery. If this causes discomfort, gargle with warm salt water. The discomfort should disappear within 24 hours. ° °If you had a scopolamine patch placed behind your ear for the management of post- operative nausea and/or vomiting: ° °1. The medication in the patch is effective for 72 hours, after which it should be removed.  Wrap patch in a tissue  and discard in the trash. Wash hands thoroughly with soap and water. °2. You may remove the patch earlier than 72 hours if you experience unpleasant side effects which may include dry mouth, dizziness or visual disturbances. °3. Avoid touching the patch. Wash your hands with soap and water after contact with the patch. °  ° °

## 2016-05-06 NOTE — Anesthesia Preprocedure Evaluation (Signed)
Anesthesia Evaluation  Patient identified by MRN, date of birth, ID band Patient awake    Reviewed: Allergy & Precautions, NPO status , Patient's Chart, lab work & pertinent test results  Airway Mallampati: II  TM Distance: >3 FB Neck ROM: Full    Dental no notable dental hx.    Pulmonary COPD, former smoker,    Pulmonary exam normal breath sounds clear to auscultation       Cardiovascular negative cardio ROS Normal cardiovascular exam Rhythm:Regular Rate:Normal     Neuro/Psych negative neurological ROS  negative psych ROS   GI/Hepatic Neg liver ROS, GERD  ,  Endo/Other  Hypothyroidism   Renal/GU negative Renal ROS  negative genitourinary   Musculoskeletal negative musculoskeletal ROS (+)   Abdominal   Peds negative pediatric ROS (+)  Hematology negative hematology ROS (+)   Anesthesia Other Findings   Reproductive/Obstetrics negative OB ROS                             Anesthesia Physical Anesthesia Plan  ASA: III  Anesthesia Plan: MAC   Post-op Pain Management:    Induction: Intravenous  Airway Management Planned: Simple Face Mask  Additional Equipment:   Intra-op Plan:   Post-operative Plan:   Informed Consent: I have reviewed the patients History and Physical, chart, labs and discussed the procedure including the risks, benefits and alternatives for the proposed anesthesia with the patient or authorized representative who has indicated his/her understanding and acceptance.   Dental advisory given  Plan Discussed with: CRNA and Surgeon  Anesthesia Plan Comments:         Anesthesia Quick Evaluation

## 2016-05-06 NOTE — Interval H&P Note (Signed)
History and Physical Interval Note:  05/06/2016 1:58 PM  Andrea Morgan  has presented today for surgery, with the diagnosis of Anal condyloma  The various methods of treatment have been discussed with the patient and family. After consideration of risks, benefits and other options for treatment, the patient has consented to  Procedure(s): EXAM UNDER ANESTHESIA (N/A) EXCISION ANAL CONDYLOMA (N/A) as a surgical intervention .  The patient's history has been reviewed, patient examined, no change in status, stable for surgery.  I have reviewed the patient's chart and labs.  Questions were answered to the patient's satisfaction.     [name], MD  [facility]

## 2016-05-06 NOTE — Anesthesia Procedure Notes (Signed)
Procedure Name: MAC Date/Time: 05/06/2016 2:12 PM Performed by: Myrtie Soman Pre-anesthesia Checklist: Patient identified, Emergency Drugs available, Suction available, Patient being monitored and Timeout performed Oxygen Delivery Method: Simple face mask Placement Confirmation: positive ETCO2 and breath sounds checked- equal and bilateral

## 2016-05-06 NOTE — H&P (View-Only) (Signed)
History of Present Illness [name] MD; 82/99/3716 10:55 AM) The patient is a 66 year old female who presents with anal lesions. This patient was referred by Dr Hilarie Fredrickson for evaluation of an anal canal lesion last year.  She is one year status post a right hemicolectomy and excisional biopsy of an anal lesion. The lesion was thought to be condylomatous on pathology. She underwent a 1 year follow-up colonoscopy with Dr. Hilarie Fredrickson in 2016. This showed another polypoid lesion in her anal canal. She was examined last year and the lesion was small and she elected to watch the area. She is here today one year later to have this evaluated again. She states she is having regular bowel movements and denies any rectal bleeding.   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 Tonsillectomy  Diagnostic Studies History [name], MD; 85/27/7824 10:39 AM) Colonoscopy within last year Mammogram within last year  Allergies Nance Pear, [place]; 04/13/2016 10:24 AM) OxyCODONE HCl ER *ANALGESICS - OPIOID* Sulfa 10 *OPHTHALMIC AGENTS*  Medication History Nance Pear, CMA; 04/13/2016 10:26 AM) LORazepam (0.'5MG'$  Tablet, Oral) Active. Atorvastatin Calcium ('20MG'$  Tablet, Oral) Active. Zolpidem Tartrate ('10MG'$  Tablet, Oral) Active. Escitalopram Oxalate ('10MG'$  Tablet, Oral) Active. Aspirin Low Strength ('81MG'$  Tablet Chewable, Oral) Active. Pepcid AC Maximum Strength ('20MG'$  Tablet, Oral) Active. Meloxicam ('15MG'$  Tablet, Oral daily) Active. LORazepam ('1MG'$  Tablet, Oral daily) Active. Carvedilol (3.'125MG'$  Tablet, Oral daily) Active. DilTIAZem HCl ER Coated Beads ('120MG'$  Capsule ER 24HR, Oral daily) Active. Escitalopram Oxalate ('20MG'$  Tablet, Oral daily) Active. Methocarbamol ('500MG'$  Tablet, Oral daily)  Active. Cyanocobalamin (1000MCG Capsule, Oral daily) Active. Medications Reconciled  Social History [name], MD; 23/53/6144 10:39 AM) Alcohol use Remotely quit alcohol use. Illicit drug use Uses weekly. No caffeine use Tobacco use Former smoker.  Family History [name], MD; 31/54/0086 10:39 AM) Alcohol Abuse Brother, Father, Sister. Colon Cancer Brother. Depression Brother, Daughter, Father, Mother. Diabetes Mellitus Brother. Hypertension Mother, Sister. Kidney Disease Sister. Migraine Headache Sister. Respiratory Condition Mother, Sister.  Pregnancy / Birth History [name], MD; 76/19/5093 10:39 AM) Age at menarche 41 years. Age of menopause >41 Gravida 2 Maternal age 90-25 Para 57  Other Problems [name], MD; 26/71/2458 10:39 AM) Depression Gastroesophageal Reflux Disease Kidney Stone Lung Cancer H/O CONDYLOMA ACUMINATUM [phone])     Review of Systems [name] MD; 38/25/0539 10:39 AM) General Not Present- Appetite Loss, Chills, Fatigue, Fever, Night Sweats, Weight Gain and Weight Loss. Skin Not Present- Change in Wart/Mole, Dryness, Hives, Jaundice, New Lesions, Non-Healing Wounds, Rash and Ulcer. HEENT Present- Ringing in the Ears and Wears glasses/contact lenses. Not Present- Earache, Hearing Loss, Hoarseness, Nose Bleed, Oral Ulcers, Seasonal Allergies, Sinus Pain, Sore Throat, Visual Disturbances and Yellow Eyes. Respiratory Not Present- Bloody sputum, Chronic Cough, Difficulty Breathing, Snoring and Wheezing. Cardiovascular Present- Leg Cramps, Palpitations and Shortness of Breath. Not Present- Chest Pain, Difficulty Breathing Lying Down, Rapid Heart Rate and Swelling of Extremities. Gastrointestinal Not Present- Abdominal Pain, Bloating, Bloody Stool, Change in Bowel Habits, Chronic diarrhea, Constipation, Difficulty Swallowing, Excessive gas, Gets full quickly at meals, Hemorrhoids, Indigestion, Nausea, Rectal  Pain and Vomiting. Female Genitourinary Not Present- Frequency, Nocturia, Painful Urination, Pelvic Pain and Urgency. Neurological Not Present- Decreased Memory, Fainting, Headaches, Numbness, Seizures, Tingling, Tremor, Trouble walking and Weakness. Psychiatric Present- Anxiety and Depression. Not Present- Bipolar, Change in Sleep Pattern, Fearful and Frequent crying. Endocrine  Present- Hot flashes. Not Present- Cold Intolerance, Excessive Hunger, Hair Changes, Heat Intolerance and New Diabetes. Hematology Present- Easy Bruising. Not Present- Excessive bleeding, Gland problems, HIV and Persistent Infections.  Vitals Bary Castilla Bradford CMA; 04/13/2016 10:26 AM) 04/13/2016 10:26 AM Weight: 166 lb Height: 66in Body Surface Area: 1.85 m Body Mass Index: 26.79 kg/m  Temp.: 98.20F  Pulse: 87 (Regular)  BP: 112/78 (Sitting, Left Arm, Standard)      Physical Exam [name] MD; 49/96/9249 10:39 AM)  General Mental Status-Alert. General Appearance-Consistent with stated age. Hydration-Well hydrated. Voice-Normal.  Eye Eyeball - Bilateral-Extraocular movements intact. Sclera/Conjunctiva - Bilateral-No scleral icterus.  Chest and Lung Exam Chest and lung exam reveals -quiet, even and easy respiratory effort with no use of accessory muscles and on auscultation, normal breath sounds, no adventitious sounds and normal vocal resonance. Inspection Chest Wall - Normal. Back - normal.  Cardiovascular Cardiovascular examination reveals -normal heart sounds, regular rate and rhythm with no murmurs and normal pedal pulses bilaterally.  Abdomen Inspection Inspection of the abdomen reveals - No Hernias. Palpation/Percussion Palpation and Percussion of the abdomen reveal - Soft, Non Tender, No Rebound tenderness, No Rigidity (guarding) and No hepatosplenomegaly. Auscultation Auscultation of the abdomen reveals - Bowel sounds normal.  Rectal Anorectal  Exam External - Note: No external lesions noted. Internal - Note: Small anterior polypoid lesion palpated.  Neurologic Neurologic evaluation reveals -alert and oriented x 3 with no impairment of recent or remote memory. Mental Status-Normal.  Musculoskeletal Global Assessment -Note:no gross deformities.  Normal Exam - Left-Upper Extremity Strength Normal and Lower Extremity Strength Normal. Normal Exam - Right-Upper Extremity Strength Normal and Lower Extremity Strength Normal.   Results [name] MD; 32/41/9914 10:40 AM) Procedures  Name Value Date ANOSCOPY, DIAGNOSTIC (44584) [ Hemorrhoids ] Procedure Other: Procedure: Anoscopy Surgeon: Marcello Moores After the risks and benefits were explained, verbal consent was obtained for above procedure. A medical assistant chaperone was present thoroughout the entire procedure. 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 This was seen on physical exam. We discussed excision versus repeat anoscopy and surveillance in the office. She would like to proceed with excision. We will get this scheduled at her convenience. Risks discussed with patient including bleeding, pain and recurrence.

## 2016-05-07 ENCOUNTER — Encounter (HOSPITAL_BASED_OUTPATIENT_CLINIC_OR_DEPARTMENT_OTHER): Payer: Self-pay | Admitting: General Surgery

## 2016-05-07 NOTE — Anesthesia Postprocedure Evaluation (Signed)
Anesthesia Post Note  Patient: Andrea Morgan  Procedure(s) Performed: Procedure(s) (LRB): EXAM UNDER ANESTHESIA (N/A) EXCISION ANAL CONDYLOMA (N/A)  Patient location during evaluation: PACU Anesthesia Type: MAC Level of consciousness: awake and alert Pain management: pain level controlled Vital Signs Assessment: post-procedure vital signs reviewed and stable Respiratory status: spontaneous breathing, nonlabored ventilation, respiratory function stable and patient connected to nasal cannula oxygen Cardiovascular status: stable and blood pressure returned to baseline Anesthetic complications: no       Last Vitals:  Vitals:   05/06/16 1536 05/06/16 1600  BP:  128/62  Pulse: 67 69  Resp: 16 16  Temp:  36.7 C    Last Pain:  Vitals:   05/06/16 1300  TempSrc: Oral                 Grae Cannata S

## 2016-06-11 DIAGNOSIS — Z6825 Body mass index (BMI) 25.0-25.9, adult: Secondary | ICD-10-CM | POA: Diagnosis not present

## 2016-06-11 DIAGNOSIS — Z1389 Encounter for screening for other disorder: Secondary | ICD-10-CM | POA: Diagnosis not present

## 2016-06-11 DIAGNOSIS — F418 Other specified anxiety disorders: Secondary | ICD-10-CM | POA: Diagnosis not present

## 2016-06-11 DIAGNOSIS — F331 Major depressive disorder, recurrent, moderate: Secondary | ICD-10-CM | POA: Diagnosis not present

## 2016-07-26 DIAGNOSIS — Z6824 Body mass index (BMI) 24.0-24.9, adult: Secondary | ICD-10-CM | POA: Diagnosis not present

## 2016-07-26 DIAGNOSIS — K219 Gastro-esophageal reflux disease without esophagitis: Secondary | ICD-10-CM | POA: Diagnosis not present

## 2016-07-26 DIAGNOSIS — F331 Major depressive disorder, recurrent, moderate: Secondary | ICD-10-CM | POA: Diagnosis not present

## 2016-09-05 ENCOUNTER — Other Ambulatory Visit: Payer: Self-pay | Admitting: Nurse Practitioner

## 2016-09-20 NOTE — Addendum Note (Signed)
Addendum  created 09/20/16 1011 by Myrtie Soman, MD   Sign clinical note

## 2016-09-20 NOTE — Anesthesia Postprocedure Evaluation (Signed)
Anesthesia Post Note  Patient: Andrea Morgan  Procedure(s) Performed: Procedure(s) (LRB): EXAM UNDER ANESTHESIA (N/A) EXCISION ANAL CONDYLOMA (N/A)     Anesthesia Post Evaluation  Last Vitals:  Vitals:   05/06/16 1536 05/06/16 1600  BP:  128/62  Pulse: 67 69  Resp: 16 16  Temp:  36.7 C    Last Pain:  Vitals:   05/06/16 1300  TempSrc: Oral                 Nasia Cannan S

## 2016-10-04 DIAGNOSIS — E039 Hypothyroidism, unspecified: Secondary | ICD-10-CM | POA: Diagnosis not present

## 2016-10-04 DIAGNOSIS — E784 Other hyperlipidemia: Secondary | ICD-10-CM | POA: Diagnosis not present

## 2016-10-04 DIAGNOSIS — Z0000 Encounter for general adult medical examination without abnormal findings: Secondary | ICD-10-CM | POA: Diagnosis not present

## 2016-10-04 DIAGNOSIS — E038 Other specified hypothyroidism: Secondary | ICD-10-CM | POA: Diagnosis not present

## 2016-10-04 DIAGNOSIS — M8580 Other specified disorders of bone density and structure, unspecified site: Secondary | ICD-10-CM | POA: Diagnosis not present

## 2016-10-04 DIAGNOSIS — M859 Disorder of bone density and structure, unspecified: Secondary | ICD-10-CM | POA: Diagnosis not present

## 2016-10-11 ENCOUNTER — Other Ambulatory Visit: Payer: Self-pay | Admitting: Internal Medicine

## 2016-10-11 DIAGNOSIS — C3490 Malignant neoplasm of unspecified part of unspecified bronchus or lung: Secondary | ICD-10-CM

## 2016-10-11 DIAGNOSIS — Z0000 Encounter for general adult medical examination without abnormal findings: Secondary | ICD-10-CM | POA: Diagnosis not present

## 2016-10-11 DIAGNOSIS — F331 Major depressive disorder, recurrent, moderate: Secondary | ICD-10-CM | POA: Diagnosis not present

## 2016-10-11 DIAGNOSIS — G4700 Insomnia, unspecified: Secondary | ICD-10-CM | POA: Diagnosis not present

## 2016-10-11 DIAGNOSIS — E784 Other hyperlipidemia: Secondary | ICD-10-CM | POA: Diagnosis not present

## 2016-10-11 DIAGNOSIS — I4891 Unspecified atrial fibrillation: Secondary | ICD-10-CM | POA: Diagnosis not present

## 2016-10-11 DIAGNOSIS — K219 Gastro-esophageal reflux disease without esophagitis: Secondary | ICD-10-CM | POA: Diagnosis not present

## 2016-10-11 DIAGNOSIS — M8580 Other specified disorders of bone density and structure, unspecified site: Secondary | ICD-10-CM | POA: Diagnosis not present

## 2016-10-11 DIAGNOSIS — E039 Hypothyroidism, unspecified: Secondary | ICD-10-CM | POA: Diagnosis not present

## 2016-10-11 DIAGNOSIS — W19XXXA Unspecified fall, initial encounter: Secondary | ICD-10-CM | POA: Diagnosis not present

## 2016-10-11 DIAGNOSIS — J449 Chronic obstructive pulmonary disease, unspecified: Secondary | ICD-10-CM | POA: Diagnosis not present

## 2016-10-11 DIAGNOSIS — Z6825 Body mass index (BMI) 25.0-25.9, adult: Secondary | ICD-10-CM | POA: Diagnosis not present

## 2016-10-11 DIAGNOSIS — Z86010 Personal history of colonic polyps: Secondary | ICD-10-CM | POA: Diagnosis not present

## 2016-10-12 ENCOUNTER — Other Ambulatory Visit: Payer: Medicare Other

## 2016-12-19 ENCOUNTER — Encounter (HOSPITAL_BASED_OUTPATIENT_CLINIC_OR_DEPARTMENT_OTHER): Payer: Self-pay | Admitting: Emergency Medicine

## 2016-12-19 ENCOUNTER — Emergency Department (HOSPITAL_BASED_OUTPATIENT_CLINIC_OR_DEPARTMENT_OTHER)
Admission: EM | Admit: 2016-12-19 | Discharge: 2016-12-19 | Disposition: A | Discharge status: Home / Self Care | Payer: Medicare Other | Attending: Emergency Medicine | Admitting: Emergency Medicine

## 2016-12-19 ENCOUNTER — Emergency Department (HOSPITAL_BASED_OUTPATIENT_CLINIC_OR_DEPARTMENT_OTHER): Payer: Medicare Other

## 2016-12-19 DIAGNOSIS — Z7982 Long term (current) use of aspirin: Secondary | ICD-10-CM | POA: Insufficient documentation

## 2016-12-19 DIAGNOSIS — M25512 Pain in left shoulder: Secondary | ICD-10-CM | POA: Diagnosis not present

## 2016-12-19 DIAGNOSIS — Z87891 Personal history of nicotine dependence: Secondary | ICD-10-CM | POA: Insufficient documentation

## 2016-12-19 DIAGNOSIS — J449 Chronic obstructive pulmonary disease, unspecified: Secondary | ICD-10-CM | POA: Diagnosis not present

## 2016-12-19 DIAGNOSIS — W0110XA Fall on same level from slipping, tripping and stumbling with subsequent striking against unspecified object, initial encounter: Secondary | ICD-10-CM | POA: Insufficient documentation

## 2016-12-19 DIAGNOSIS — S42215A Unspecified nondisplaced fracture of surgical neck of left humerus, initial encounter for closed fracture: Secondary | ICD-10-CM | POA: Diagnosis not present

## 2016-12-19 DIAGNOSIS — Z85118 Personal history of other malignant neoplasm of bronchus and lung: Secondary | ICD-10-CM | POA: Insufficient documentation

## 2016-12-19 DIAGNOSIS — S4992XA Unspecified injury of left shoulder and upper arm, initial encounter: Secondary | ICD-10-CM | POA: Diagnosis not present

## 2016-12-19 DIAGNOSIS — Y929 Unspecified place or not applicable: Secondary | ICD-10-CM | POA: Diagnosis not present

## 2016-12-19 DIAGNOSIS — Z79899 Other long term (current) drug therapy: Secondary | ICD-10-CM | POA: Diagnosis not present

## 2016-12-19 DIAGNOSIS — Y9301 Activity, walking, marching and hiking: Secondary | ICD-10-CM | POA: Diagnosis not present

## 2016-12-19 DIAGNOSIS — M79622 Pain in left upper arm: Secondary | ICD-10-CM | POA: Diagnosis not present

## 2016-12-19 DIAGNOSIS — Y999 Unspecified external cause status: Secondary | ICD-10-CM | POA: Insufficient documentation

## 2016-12-19 DIAGNOSIS — E039 Hypothyroidism, unspecified: Secondary | ICD-10-CM | POA: Insufficient documentation

## 2016-12-19 MED ORDER — IBUPROFEN 600 MG PO TABS: 600.0000 mg | ORAL_TABLET | Freq: Four times a day (QID) | ORAL | 0 refills | Status: DC | PRN

## 2016-12-19 MED ORDER — HYDROCODONE-ACETAMINOPHEN 5-325 MG PO TABS: 1.0000 | ORAL_TABLET | Freq: Four times a day (QID) | ORAL | 0 refills | Status: DC | PRN

## 2016-12-19 MED ORDER — HYDROCODONE-ACETAMINOPHEN 5-325 MG PO TABS
1.0000 | ORAL_TABLET | Freq: Once | ORAL | Status: AC
  Administered 2016-12-19: 1 via ORAL
  Filled 2016-12-19: qty 1

## 2016-12-19 NOTE — Discharge Instructions (Signed)
Please read and follow all provided instructions.  Your diagnoses today include:  1. Closed nondisplaced fracture of surgical neck of left humerus, unspecified fracture morphology, initial encounter     Tests performed today include: Vital signs. See below for your results today.   Medications prescribed:  Take as prescribed   Home care instructions:  Follow any educational materials contained in this packet.  Follow-up instructions: Please follow-up with Orthopedics for further evaluation of symptoms and treatment   Return instructions:  Please return to the Emergency Department if you do not get better, if you get worse, or new symptoms OR  - Fever (temperature greater than 101.71F)  - Bleeding that does not stop with holding pressure to the area    -Severe pain (please note that you may be more sore the day after your accident)  - Chest Pain  - Difficulty breathing  - Severe nausea or vomiting  - Inability to tolerate food and liquids  - Passing out  - Skin becoming red around your wounds  - Change in mental status (confusion or lethargy)  - New numbness or weakness    Please return if you have any other emergent concerns.  Additional Information:  Your vital signs today were: BP (!) 133/57 (BP Location: Right Arm)    Pulse 70    Temp 98.3 F (36.8 C) (Oral)    Resp 18    Ht 5' 6.5" (1.689 m)    Wt 72.6 kg (160 lb)    SpO2 99%    BMI 25.44 kg/m  If your blood pressure (BP) was elevated above 135/85 this visit, please have this repeated by your doctor within one month. ---------------

## 2016-12-19 NOTE — ED Provider Notes (Signed)
[facility] DEPT [facility] Provider Note   CSN: [id number] Arrival date & time: 12/19/16  1244     History   Chief Complaint Chief Complaint  Patient presents with  . Fall    HPI Andrea Morgan is a 66 y.o. female.  HPI  66 y.o. female, presents to the Emergency Department today due to mechanical fall. Pt slipped on wet grass and fell on arm. Notes left upper arm pain. Unable to raise up due to pain. No head trauma or LOC. Rates pain 10/10. Throbbing sensation. Worse with ROM. Minimal at rest. No numbness/tingling. No blood thinners. No other symptoms noted  Past Medical History:  Diagnosis Date  . Adrenal adenoma   . Anal condyloma   . Anemia   . Anxiety   . Arthritis    arthritis left jaw / pain    . DDD (degenerative disc disease), thoracic   . Depression   . Dyspnea on exertion   . Emphysema/COPD ([place])   . GERD (gastroesophageal reflux disease)    watches diet  . Hiatal hernia   . History of adenomatous polyp of colon    06/ 2016 hyperplastic  . 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 History of condyloma acuminatum    removal anal canal condyloma 11-08-2013  . History of kidney stones   . History of panic attacks   . Hyperlipidemia   . Hypothyroidism   . Insomnia   . Liver cyst    per CT in 2012  . PAF (paroxysmal atrial fibrillation) [facility]) cardiologist-  dr Mare Ferrari   single episode Atrial Fibrillation w/ RVR post-op day 3 the right colectomy surgery on 11-08-2013      . PONV (postoperative nausea and vomiting)   . Pulmonary nodule, right    right upper lobe per last Chest CT 05-14-2015    Patient Active Problem List   Diagnosis Date Noted  . Thoracic disc herniation 01/13/2016  . Chest pain 08/28/2014  . Chronic headaches   . Anxiety   . Cephalalgia   .  Hypercholesterolemia 03/11/2014  . Atrial fibrillation with RVR - post-operative  11/12/2013  . Personal history of colonic polyps 11/08/2013  . PAF (paroxysmal atrial fibrillation) ([place]) 10/17/2013  . COPD (chronic obstructive pulmonary disease) ([place]) 04/26/2013  . 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 Chest mass 05/04/2011    Past Surgical History:  Procedure Laterality Date  . APPENDECTOMY  1978  . CERVICAL FUSION  1997   C5 -- C7  . COLONOSCOPY  last one 10-17-2014  . ESOPHAGOGASTRODUODENOSCOPY  05/21/2011   Procedure: ESOPHAGOGASTRODUODENOSCOPY (EGD);  Surgeon: Beryle Beams, MD;  Location: [facility];  Service: Endoscopy;  Laterality: N/A;  . EXAMINATION UNDER ANESTHESIA N/A 11/08/2013   Procedure: ANAL EXAM UNDER ANESTHESIA WITH BIOPSY;  Surgeon: [name], MD;  Location: [facility];  Service: General;  Laterality: N/A;  . LAPAROSCOPIC PARTIAL COLECTOMY N/A 11/08/2013   Procedure: laparoscopic partial right colectomy;  Surgeon: [name], MD;  Location: [facility];  Service: General;  Laterality: N/A;  . MASS EXCISION  06/02/2011   Procedure: EXCISION MASS;  Surgeon: Grace Isaac, MD;  Location: [facility];  Service: Thoracic;;  minithoracotomy resection spindle cell tumor right chest  . MASS EXCISION N/A 05/06/2016  Procedure: EXCISION ANAL CONDYLOMA;  Surgeon: [name], MD;  Location[facility];  Service: General;  Laterality: N/A;  . THORACIC DISCECTOMY Right 01/13/2016   Procedure: RIGHT THORACIC ONE THORACIC TWO THORACIC LAMINOTOMY AND MICRODISCECTOMY;  Surgeon: Jovita Gamma, MD;  Location: [facility];  Service: Neurosurgery;  Laterality: Right;  . TONSILLECTOMY  1971  . TRANSTHORACIC ECHOCARDIOGRAM  08/28/2014   ef 55-60%/  trivial MR and TR  . VIDEO BRONCHOSCOPY  05/11/2011   Procedure: VIDEO BRONCHOSCOPY WITH FLUORO;  Surgeon: Collene Gobble, MD;  Location: [facility];  Service: Cardiopulmonary;   Laterality: Bilateral;  . VIDEO BRONCHOSCOPY  06/02/2011   Procedure: VIDEO BRONCHOSCOPY;  Surgeon: Grace Isaac, MD;  Location[facility] OR;  Service: Thoracic;  Laterality: N/A;    OB History    No data available       Home Medications    Prior to Admission medications   Medication Sig Start Date End Date Taking? Authorizing Provider  albuterol (PROVENTIL HFA;VENTOLIN HFA) 108 (90 BASE) MCG/ACT inhaler Inhale 1 puff into the lungs every 6 (six) hours as needed for wheezing or shortness of breath.    [provider]  aspirin EC 81 MG tablet Take 81 mg by mouth daily.    [provider]  atorvastatin (LIPITOR) 20 MG tablet Take 20 mg by mouth at bedtime.     [provider]  carvedilol (COREG) 3.125 MG tablet Take 3.125 mg by mouth every morning.     [provider]  Cholecalciferol (VITAMIN D) 2000 units tablet Take 2,000 Units by mouth daily.    [provider]  diltiazem (CARDIZEM CD) 120 MG 24 hr capsule TAKE 1 CAPSULE (120 MG TOTAL) BY MOUTH DAILY. 09/06/16   Burtis Junes, NP  escitalopram (LEXAPRO) 20 MG tablet Take 20 mg by mouth every evening.  12/11/15   [provider]  HYDROcodone-acetaminophen (NORCO/VICODIN) 5-325 MG tablet Take 1 tablet by mouth every 4 (four) hours as needed. 07/31/22   [name], MD  levothyroxine (SYNTHROID, LEVOTHROID) 50 MCG tablet Take 50 mcg by mouth daily before breakfast.  12/11/15   [provider]  LORazepam (ATIVAN) 1 MG tablet Take 1 mg by mouth daily as needed for anxiety.  08/01/15   [provider]  naproxen sodium (ANAPROX) 220 MG tablet Take 440 mg by mouth every 12 (twelve) hours as needed (pain).    [provider]  vitamin B-12 (CYANOCOBALAMIN) 1000 MCG tablet Take 3,000 mcg by mouth daily.    [provider]  zolpidem (AMBIEN) 10 MG tablet Take 10 mg by mouth at bedtime.     [provider]    Family History Family History  Problem  Relation Age of Onset  . Asthma Mother   . Heart disease Mother   . Lung cancer Father   . Asthma Maternal Grandmother   . Ovarian cancer Maternal Aunt   . Breast cancer Maternal Aunt        x2  . Prostate cancer Maternal Uncle   . Kidney cancer Sister   . Colon cancer Brother   . Anesthesia problems Neg Hx     Social History Social History  Substance Use Topics  . Smoking status: Former Smoker    Packs/day: 1.00    Years: 45.00    Types: Cigarettes    Quit date: 04/13/2012  . Smokeless tobacco: Never Used  . Alcohol use No     Allergies   Oxycodone; Sulfa antibiotics;  and Morphine and related   Review of Systems Review of Systems ROS reviewed and all are negative for acute change except as noted in the HPI.  Physical Exam Updated Vital Signs BP (!) 150/72 (BP Location: Right Arm)   Pulse 63   Temp 98.3 F (36.8 C) (Oral)   Resp 18   Ht 5\' 6"  (1.676 m)   Wt 7.258 kg (16 lb)   SpO2 97%   BMI 2.58 kg/m   Physical Exam  Constitutional: She is oriented to person, place, and time. Vital signs are normal. She appears well-developed and well-nourished.  HENT:  Head: Normocephalic.  Right Ear: Hearing normal.  Left Ear: Hearing normal.  Eyes: Pupils are equal, round, and reactive to light. Conjunctivae and EOM are normal.  Cardiovascular: Normal rate and regular rhythm.   Pulmonary/Chest: Effort normal.  Musculoskeletal:  Left arm without obvious deformity. Pt leaning and compensating due to pain. TTP. Limited ROM due to pain. NVI. Distal pulses appreciated.   Neurological: She is alert and oriented to person, place, and time.  Skin: Skin is warm and dry.  Psychiatric: She has a normal mood and affect. Her speech is normal and behavior is normal. Thought content normal.     ED Treatments / Results  Labs (all labs ordered are listed, but only abnormal results are displayed) Labs Reviewed - No data to display  EKG  EKG Interpretation None        Radiology Dg Humerus Left  Result Date: 12/19/2016 CLINICAL DATA:  Left upper arm pain following a fall today. EXAM: LEFT HUMERUS - 2+ VIEW COMPARISON:  None. FINDINGS: Possible nondisplaced fracture in the left humeral neck. Otherwise, normal appearing bones and soft tissues. IMPRESSION: Possible nondisplaced left humeral neck fracture. This may be artifactual due to overlapping of the humeral head and neck on one of the views. If the patient has left shoulder or proximal left upper arm pain, left shoulder radiographs would be recommended. Electronically Signed   By: Claudie Revering M.D.   On: 12/19/2016 13:19    Procedures Procedures (including critical care time)  Medications Ordered in ED Medications - No data to display   Initial Impression / Assessment and Plan / ED Course  I have reviewed the triage vital signs and the nursing notes.  Pertinent labs & imaging results that were available during my care of the patient were reviewed by me and considered in my medical decision making (see chart for details).  Final Clinical Impressions(s) / ED Diagnoses   {I have reviewed and evaluated the relevant imaging studies.  {I have reviewed the relevant previous healthcare records.  {I obtained HPI from historian.   ED Course:  Assessment: Patient X-Ray shows nondisplaced surgical neck fracture of left humerus  Pt advised to follow up with orthopedics. Patient given sling while in ED, conservative therapy recommended and discussed. Patient will be discharged home & is agreeable with above plan. I have reviewed the [place] Controlled Substance Reporting System. Given Rx Norco. Returns precautions discussed. Pt appears safe for discharge.  Disposition/Plan:  DC Home Additional Verbal discharge instructions given and discussed with patient.  Pt Instructed to f/u with Ortho in the next week for evaluation and treatment of symptoms. Return precautions given Pt acknowledges and agrees  with plan  Supervising Physician Forde Dandy, MD  Final diagnoses:  Closed nondisplaced fracture of surgical neck of left humerus, unspecified fracture morphology, initial encounter    New Prescriptions New Prescriptions   No  medications on file     Shary Decamp, Hershal Coria 12/19/16 [place], MD 12/19/16 [phone]

## 2016-12-19 NOTE — ED Triage Notes (Signed)
Pt slipped on wet grass and fell today. C/o L upper arm pain, unable to raise it up. Denies LOC

## 2016-12-22 DIAGNOSIS — S42295A Other nondisplaced fracture of upper end of left humerus, initial encounter for closed fracture: Secondary | ICD-10-CM | POA: Diagnosis not present

## 2017-01-05 DIAGNOSIS — S42295D Other nondisplaced fracture of upper end of left humerus, subsequent encounter for fracture with routine healing: Secondary | ICD-10-CM | POA: Diagnosis not present

## 2017-01-17 DIAGNOSIS — Z6825 Body mass index (BMI) 25.0-25.9, adult: Secondary | ICD-10-CM | POA: Diagnosis not present

## 2017-01-17 DIAGNOSIS — S42302S Unspecified fracture of shaft of humerus, left arm, sequela: Secondary | ICD-10-CM | POA: Diagnosis not present

## 2017-01-17 DIAGNOSIS — E038 Other specified hypothyroidism: Secondary | ICD-10-CM | POA: Diagnosis not present

## 2017-01-17 DIAGNOSIS — F331 Major depressive disorder, recurrent, moderate: Secondary | ICD-10-CM | POA: Diagnosis not present

## 2017-01-17 DIAGNOSIS — Z23 Encounter for immunization: Secondary | ICD-10-CM | POA: Diagnosis not present

## 2017-02-02 DIAGNOSIS — S42295D Other nondisplaced fracture of upper end of left humerus, subsequent encounter for fracture with routine healing: Secondary | ICD-10-CM | POA: Diagnosis not present

## 2017-02-08 DIAGNOSIS — S42295D Other nondisplaced fracture of upper end of left humerus, subsequent encounter for fracture with routine healing: Secondary | ICD-10-CM | POA: Diagnosis not present

## 2017-02-08 DIAGNOSIS — M25512 Pain in left shoulder: Secondary | ICD-10-CM | POA: Diagnosis not present

## 2017-02-08 DIAGNOSIS — M25612 Stiffness of left shoulder, not elsewhere classified: Secondary | ICD-10-CM | POA: Diagnosis not present

## 2017-06-29 DIAGNOSIS — J449 Chronic obstructive pulmonary disease, unspecified: Secondary | ICD-10-CM | POA: Diagnosis not present

## 2017-06-29 DIAGNOSIS — Z6826 Body mass index (BMI) 26.0-26.9, adult: Secondary | ICD-10-CM | POA: Diagnosis not present

## 2017-06-29 DIAGNOSIS — J209 Acute bronchitis, unspecified: Secondary | ICD-10-CM | POA: Diagnosis not present

## 2017-06-29 DIAGNOSIS — R05 Cough: Secondary | ICD-10-CM | POA: Diagnosis not present

## 2017-06-29 DIAGNOSIS — J181 Lobar pneumonia, unspecified organism: Secondary | ICD-10-CM | POA: Diagnosis not present

## 2017-07-10 ENCOUNTER — Other Ambulatory Visit: Payer: Self-pay | Admitting: Nurse Practitioner

## 2017-09-06 ENCOUNTER — Encounter: Payer: Self-pay | Admitting: Internal Medicine

## 2017-09-26 ENCOUNTER — Other Ambulatory Visit: Payer: Self-pay

## 2017-09-26 ENCOUNTER — Ambulatory Visit (AMBULATORY_SURGERY_CENTER): Payer: Self-pay | Admitting: *Deleted

## 2017-09-26 VITALS — Ht 66.5 in | Wt 165.0 lb

## 2017-09-26 DIAGNOSIS — Z800 Family history of malignant neoplasm of digestive organs: Secondary | ICD-10-CM

## 2017-09-26 DIAGNOSIS — Z86010 Personal history of colonic polyps: Secondary | ICD-10-CM

## 2017-09-26 MED ORDER — NA SULFATE-K SULFATE-MG SULF 17.5-3.13-1.6 GM/177ML PO SOLN: ORAL | 0 refills | Status: DC

## 2017-09-26 NOTE — Progress Notes (Signed)
Patient denies any allergies to eggs or soy. Patient denies any problems with anesthesia/sedation. Patient denies any oxygen use at home. Patient denies taking any diet/weight loss medications or blood thinners. EMMI education offered, pt declined.  

## 2017-09-30 ENCOUNTER — Ambulatory Visit (AMBULATORY_SURGERY_CENTER): Payer: Medicare Other | Admitting: Internal Medicine

## 2017-09-30 ENCOUNTER — Encounter: Payer: Self-pay | Admitting: Internal Medicine

## 2017-09-30 ENCOUNTER — Other Ambulatory Visit: Payer: Self-pay

## 2017-09-30 VITALS — BP 108/54 | HR 66 | Temp 97.8°F | Resp 15 | Ht 66.5 in | Wt 165.0 lb

## 2017-09-30 DIAGNOSIS — I4891 Unspecified atrial fibrillation: Secondary | ICD-10-CM | POA: Diagnosis not present

## 2017-09-30 DIAGNOSIS — Z86010 Personal history of colonic polyps: Secondary | ICD-10-CM | POA: Diagnosis not present

## 2017-09-30 DIAGNOSIS — D123 Benign neoplasm of transverse colon: Secondary | ICD-10-CM | POA: Diagnosis not present

## 2017-09-30 DIAGNOSIS — E039 Hypothyroidism, unspecified: Secondary | ICD-10-CM | POA: Diagnosis not present

## 2017-09-30 DIAGNOSIS — J449 Chronic obstructive pulmonary disease, unspecified: Secondary | ICD-10-CM | POA: Diagnosis not present

## 2017-09-30 DIAGNOSIS — I10 Essential (primary) hypertension: Secondary | ICD-10-CM | POA: Diagnosis not present

## 2017-09-30 MED ORDER — SODIUM CHLORIDE 0.9 % IV SOLN: 500.0000 mL | Freq: Once | INTRAVENOUS | Status: DC

## 2017-09-30 NOTE — Progress Notes (Signed)
To recovery, report to RN, VSS. 

## 2017-09-30 NOTE — Patient Instructions (Signed)
  Please read handouts on polyps and hemorrhoids.     YOU HAD AN ENDOSCOPIC PROCEDURE TODAY AT [facility]:   Refer to the procedure report that was given to you for any specific questions about what was found during the examination.  If the procedure report does not answer your questions, please call your gastroenterologist to clarify.  If you requested that your care partner not be given the details of your procedure findings, then the procedure report has been included in a sealed envelope for you to review at your convenience later.  YOU SHOULD EXPECT: Some feelings of bloating in the abdomen. Passage of more gas than usual.  Walking can help get rid of the air that was put into your GI tract during the procedure and reduce the bloating. If you had a lower endoscopy (such as a colonoscopy or flexible sigmoidoscopy) you may notice spotting of blood in your stool or on the toilet paper. If you underwent a bowel prep for your procedure, you may not have a normal bowel movement for a few days.  Please Note:  You might notice some irritation and congestion in your nose or some drainage.  This is from the oxygen used during your procedure.  There is no need for concern and it should clear up in a day or so.  SYMPTOMS TO REPORT IMMEDIATELY:   Following lower endoscopy (colonoscopy or flexible sigmoidoscopy):  Excessive amounts of blood in the stool  Significant tenderness or worsening of abdominal pains  Swelling of the abdomen that is new, acute  Fever of 100F or higher   For urgent or emergent issues, a gastroenterologist can be reached at any hour by calling [phone].   DIET:  We do recommend a small meal at first, but then you may proceed to your regular diet.  Drink plenty of fluids but you should avoid alcoholic beverages for 24 hours.  ACTIVITY:  You should plan to take it easy for the rest of today and you should NOT DRIVE or use heavy machinery until tomorrow  (because of the sedation medicines used during the test).    FOLLOW UP: Our staff will call the number listed on your records the next business day following your procedure to check on you and address any questions or concerns that you may have regarding the information given to you following your procedure. If we do not reach you, we will leave a message.  However, if you are feeling well and you are not experiencing any problems, there is no need to return our call.  We will assume that you have returned to your regular daily activities without incident.  If any biopsies were taken you will be contacted by phone or by letter within the next 1-3 weeks.  Please call us at [phone] if you have not heard about the biopsies in 3 weeks.    SIGNATURES/CONFIDENTIALITY: You and/or your care partner have signed paperwork which will be entered into your electronic medical record.  These signatures attest to the fact that that the information above on your After Visit Summary has been reviewed and is understood.  Full responsibility of the confidentiality of this discharge information lies with you and/or your care-partner.

## 2017-09-30 NOTE — Op Note (Signed)
[name] Patient Name: Andrea Morgan Procedure Date: 09/30/2017 9:54 AM MRN: [id number] Endoscopist: Jerene Bears , MD Age: 67 Referring MD:  Date of Birth: 1950-12-01 Gender: Female Account #: [id number] Procedure:                Colonoscopy Indications:              High risk colon cancer surveillance: Personal                            history of adenoma with high grade dysplasia s/p                            right hemicolectomy, Last colonoscopy 3 years ago Medicines:                Monitored Anesthesia Care Procedure:                Pre-Anesthesia Assessment:                           - Prior to the procedure, a History and Physical                            was performed, and patient medications and                            allergies were reviewed. The patient's tolerance of                            previous anesthesia was also reviewed. The risks                            and benefits of the procedure and the sedation                            options and risks were discussed with the patient.                            All questions were answered, and informed consent                            was obtained. Prior Anticoagulants: The patient has                            taken no previous anticoagulant or antiplatelet                            agents. ASA Grade Assessment: III - A patient with                            severe systemic disease. After reviewing the risks                            and benefits, the patient was deemed in  satisfactory condition to undergo the procedure.                           After obtaining informed consent, the colonoscope                            was passed under direct vision. Throughout the                            procedure, the patient's blood pressure, pulse, and                            oxygen saturations were monitored continuously. The                            Model  [id number]-H190DL [id number]) scope was introduced                            through the anus and advanced to the ileocolonic                            anastomosis. The colonoscopy was performed without                            difficulty. The patient tolerated the procedure                            well. The quality of the bowel preparation was                            good. The rectum was photographed. Scope In: 9:58:40 AM Scope Out: 10:10:08 AM Scope Withdrawal Time: 0 hours 8 minutes 19 seconds  Total Procedure Duration: 0 hours 11 minutes 28 seconds  Findings:                 The digital rectal exam was normal.                           There was evidence of a prior end-to-side                            ileo-colonic anastomosis at the hepatic flexure.                            This was patent and was characterized by healthy                            appearing mucosa.                           A 6 mm polyp was found in the proximal transverse                            colon. The polyp was sessile. The polyp was removed  with a cold snare. Resection and retrieval were                            complete.                           A few small-mouthed diverticula were found in the                            sigmoid colon and descending colon.                           The retroflexed view of the distal rectum and anal                            verge was normal and showed no anal or rectal                            abnormalities (no evidence of residual condyloma) Complications:            No immediate complications. Estimated Blood Loss:     Estimated blood loss was minimal. Impression:               - Patent end-to-side ileo-colonic anastomosis,                            characterized by healthy appearing mucosa.                           - One 6 mm polyp in the proximal transverse colon,                            removed with a cold snare. Resected  and retrieved.                           - Mild diverticulosis in the sigmoid colon and in                            the descending colon.                           - The distal rectum and anal verge are normal on                            retroflexion view. Recommendation:           - Patient has a contact number available for                            emergencies. The signs and symptoms of potential                            delayed complications were discussed with the  patient. Return to normal activities tomorrow.                            Written discharge instructions were provided to the                            patient.                           - Resume previous diet.                           - Continue present medications.                           - Await pathology results.                           - Repeat colonoscopy in 5 years for surveillance. Jerene Bears, MD 09/30/2017 10:15:34 AM This report has been signed electronically.

## 2017-09-30 NOTE — Progress Notes (Signed)
Called to room to assist during endoscopic procedure.  Patient ID and intended procedure confirmed with present staff. Received instructions for my participation in the procedure from the performing physician.  

## 2017-10-03 ENCOUNTER — Telehealth: Payer: Self-pay | Admitting: *Deleted

## 2017-10-03 ENCOUNTER — Telehealth: Payer: Self-pay

## 2017-10-03 NOTE — Telephone Encounter (Signed)
  Follow up Call-  Call back number 09/30/2017  Post procedure Call Back phone  # [phone]  Permission to leave phone message Yes  Some recent data might be hidden     No answer.  unalble to leave message. Angela/ call-back [place]

## 2017-10-03 NOTE — Telephone Encounter (Signed)
  Follow up Call-  Call back number 09/30/2017  Post procedure Call Back phone  # [phone]  Permission to leave phone message Yes  Some recent data might be hidden     Patient questions:  Message left to call us if necessary.

## 2017-10-05 ENCOUNTER — Encounter: Payer: Self-pay | Admitting: Internal Medicine

## 2017-10-05 DIAGNOSIS — M859 Disorder of bone density and structure, unspecified: Secondary | ICD-10-CM | POA: Diagnosis not present

## 2017-10-05 DIAGNOSIS — E7849 Other hyperlipidemia: Secondary | ICD-10-CM | POA: Diagnosis not present

## 2017-10-05 DIAGNOSIS — E038 Other specified hypothyroidism: Secondary | ICD-10-CM | POA: Diagnosis not present

## 2017-10-12 ENCOUNTER — Other Ambulatory Visit: Payer: Self-pay | Admitting: Internal Medicine

## 2017-10-12 DIAGNOSIS — Z8249 Family history of ischemic heart disease and other diseases of the circulatory system: Secondary | ICD-10-CM | POA: Diagnosis not present

## 2017-10-12 DIAGNOSIS — I4891 Unspecified atrial fibrillation: Secondary | ICD-10-CM | POA: Diagnosis not present

## 2017-10-12 DIAGNOSIS — Z1389 Encounter for screening for other disorder: Secondary | ICD-10-CM | POA: Diagnosis not present

## 2017-10-12 DIAGNOSIS — E038 Other specified hypothyroidism: Secondary | ICD-10-CM | POA: Diagnosis not present

## 2017-10-12 DIAGNOSIS — M859 Disorder of bone density and structure, unspecified: Secondary | ICD-10-CM | POA: Diagnosis not present

## 2017-10-12 DIAGNOSIS — Z0000 Encounter for general adult medical examination without abnormal findings: Secondary | ICD-10-CM | POA: Diagnosis not present

## 2017-10-12 DIAGNOSIS — F418 Other specified anxiety disorders: Secondary | ICD-10-CM | POA: Diagnosis not present

## 2017-10-12 DIAGNOSIS — E7849 Other hyperlipidemia: Secondary | ICD-10-CM | POA: Diagnosis not present

## 2017-10-12 DIAGNOSIS — Z6825 Body mass index (BMI) 25.0-25.9, adult: Secondary | ICD-10-CM | POA: Diagnosis not present

## 2017-10-12 DIAGNOSIS — J449 Chronic obstructive pulmonary disease, unspecified: Secondary | ICD-10-CM | POA: Diagnosis not present

## 2017-10-12 DIAGNOSIS — Z86010 Personal history of colonic polyps: Secondary | ICD-10-CM | POA: Diagnosis not present

## 2017-10-12 DIAGNOSIS — G4709 Other insomnia: Secondary | ICD-10-CM | POA: Diagnosis not present

## 2017-10-12 DIAGNOSIS — Z1231 Encounter for screening mammogram for malignant neoplasm of breast: Secondary | ICD-10-CM

## 2017-10-18 DIAGNOSIS — F331 Major depressive disorder, recurrent, moderate: Secondary | ICD-10-CM | POA: Diagnosis not present

## 2017-10-18 DIAGNOSIS — F418 Other specified anxiety disorders: Secondary | ICD-10-CM | POA: Diagnosis not present

## 2017-11-11 DIAGNOSIS — F418 Other specified anxiety disorders: Secondary | ICD-10-CM | POA: Diagnosis not present

## 2017-11-11 DIAGNOSIS — F331 Major depressive disorder, recurrent, moderate: Secondary | ICD-10-CM | POA: Diagnosis not present

## 2017-12-02 DIAGNOSIS — F418 Other specified anxiety disorders: Secondary | ICD-10-CM | POA: Diagnosis not present

## 2017-12-02 DIAGNOSIS — F331 Major depressive disorder, recurrent, moderate: Secondary | ICD-10-CM | POA: Diagnosis not present

## 2017-12-12 ENCOUNTER — Ambulatory Visit
Admission: RE | Admit: 2017-12-12 | Discharge: 2017-12-12 | Disposition: A | Discharge status: Home / Self Care | Payer: Medicare Other | Source: Ambulatory Visit | Attending: Internal Medicine | Admitting: Internal Medicine

## 2017-12-12 DIAGNOSIS — Z1231 Encounter for screening mammogram for malignant neoplasm of breast: Secondary | ICD-10-CM

## 2018-01-04 DIAGNOSIS — F418 Other specified anxiety disorders: Secondary | ICD-10-CM | POA: Diagnosis not present

## 2018-01-04 DIAGNOSIS — F331 Major depressive disorder, recurrent, moderate: Secondary | ICD-10-CM | POA: Diagnosis not present

## 2018-01-13 DIAGNOSIS — R05 Cough: Secondary | ICD-10-CM | POA: Diagnosis not present

## 2018-01-13 DIAGNOSIS — J209 Acute bronchitis, unspecified: Secondary | ICD-10-CM | POA: Diagnosis not present

## 2018-01-13 DIAGNOSIS — J449 Chronic obstructive pulmonary disease, unspecified: Secondary | ICD-10-CM | POA: Diagnosis not present

## 2018-01-13 DIAGNOSIS — M25521 Pain in right elbow: Secondary | ICD-10-CM | POA: Diagnosis not present

## 2018-01-31 DIAGNOSIS — G4700 Insomnia, unspecified: Secondary | ICD-10-CM | POA: Diagnosis not present

## 2018-01-31 DIAGNOSIS — F418 Other specified anxiety disorders: Secondary | ICD-10-CM | POA: Diagnosis not present

## 2018-01-31 DIAGNOSIS — F331 Major depressive disorder, recurrent, moderate: Secondary | ICD-10-CM | POA: Diagnosis not present

## 2018-03-03 IMAGING — CR DG CHEST 2V
2 series · 2 of 2 positions shown · non-contrast
Comparison: 08/27/2014

CLINICAL DATA: Back pain

EXAM:
CHEST  2 VIEW

[w chest pa]
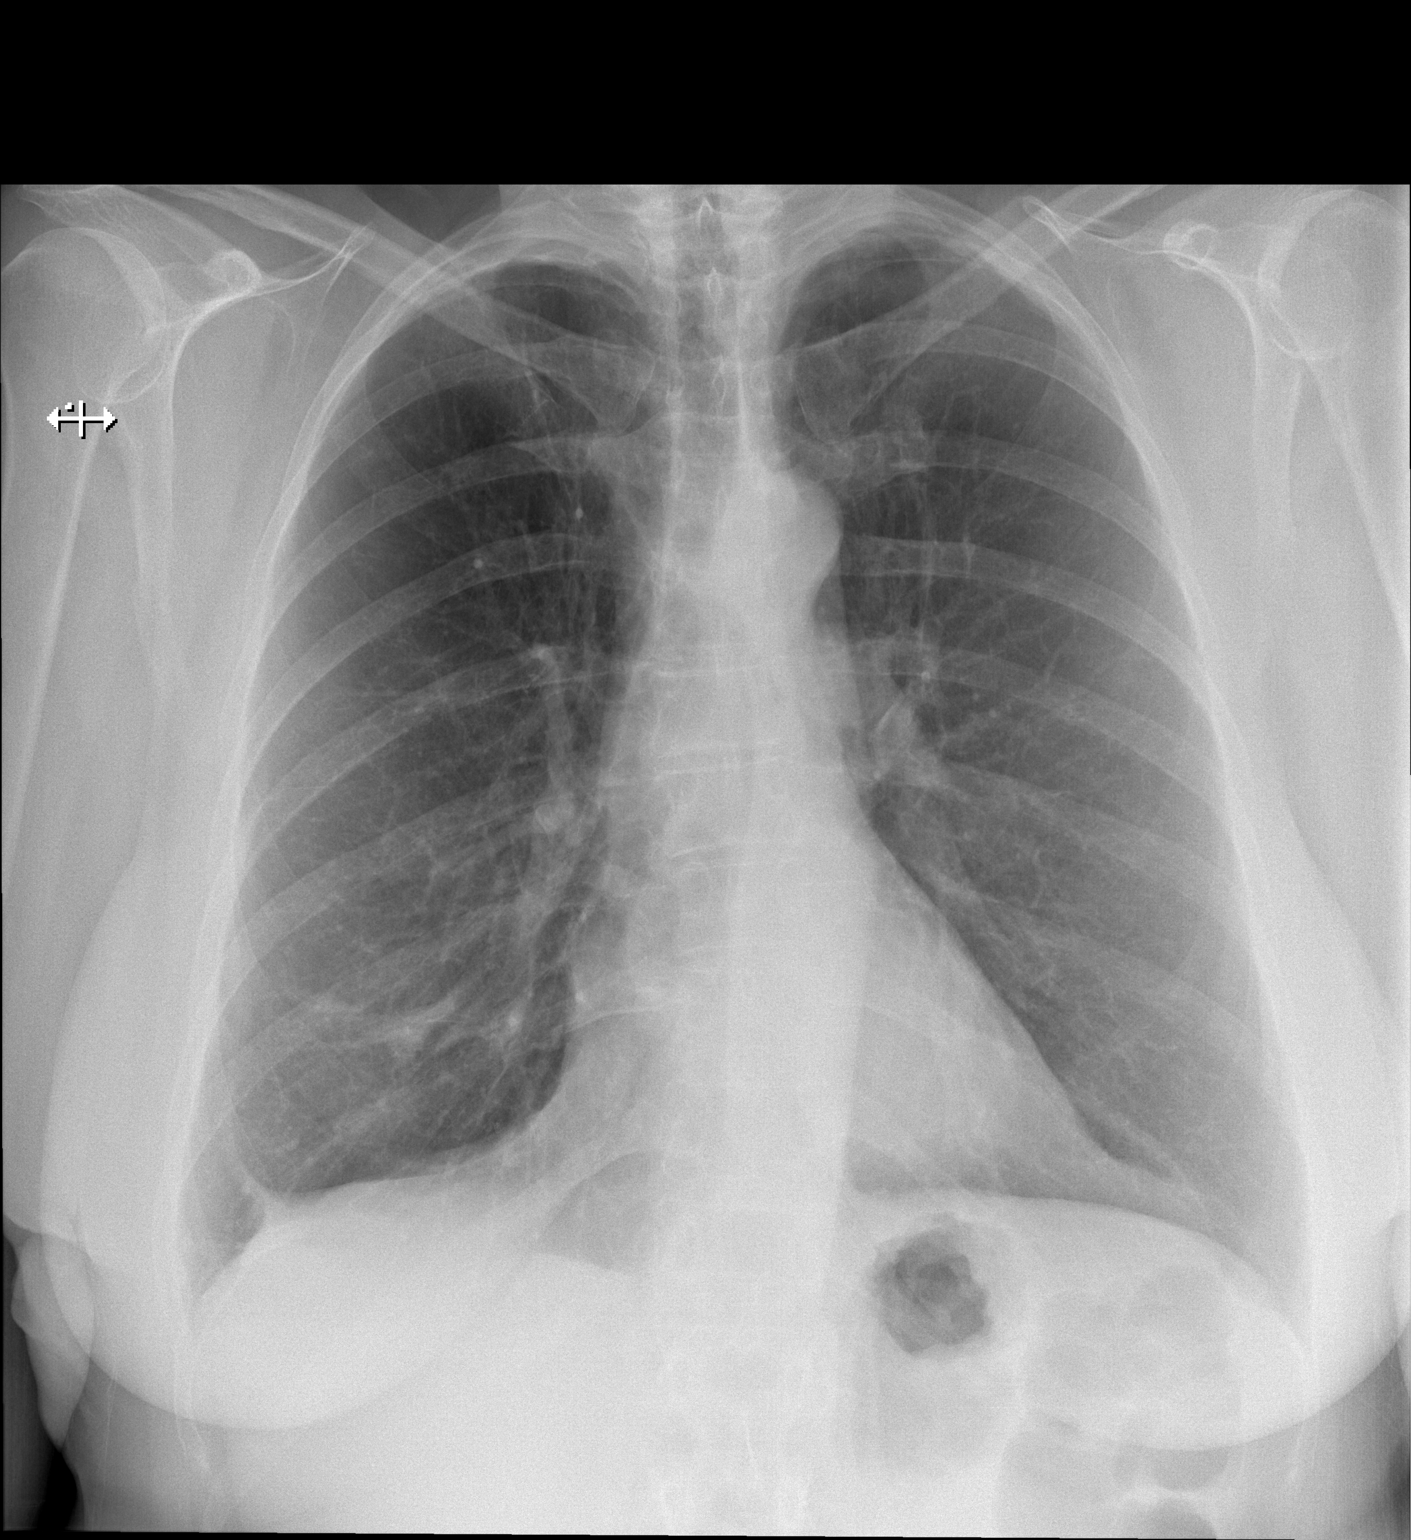

[w chest lat]
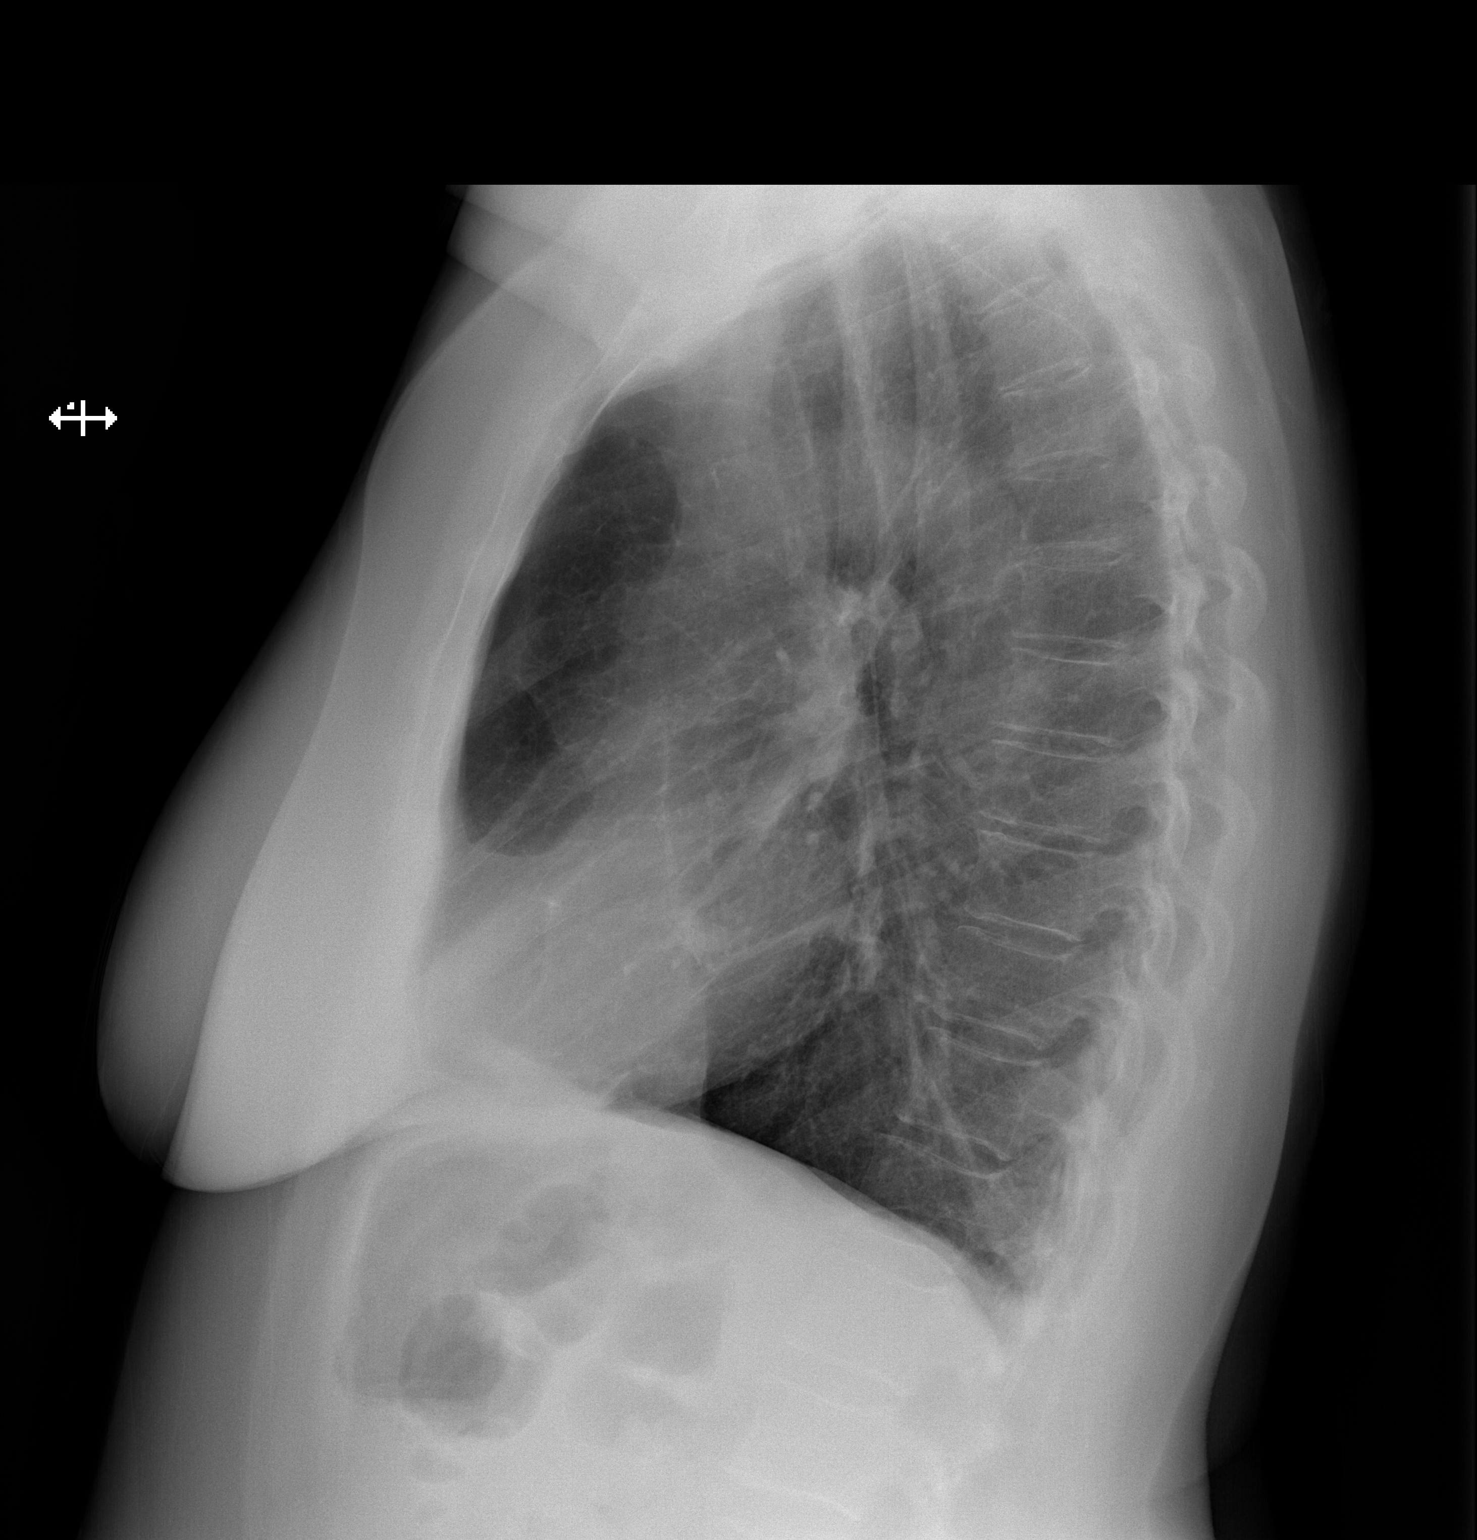

[2 of 2 positions shown; findings below may reference images not displayed]

FINDINGS: The heart size appears normal. No pleural effusion or edema. No
airspace opacities. Scar like densities in the right lung base are
unchanged from previous exam. There is no suspicious bone
abnormality.
IMPRESSION: 1. No acute cardiopulmonary abnormalities.
2. Right base scarring.

## 2018-03-08 IMAGING — MR MR CERVICAL SPINE W/O CM
5 series · 29 of 48 positions shown · non-contrast
Comparison: Cervical spine radiograph dated 10/06/2012 and cervical
spine MRI 11/14/2012

CLINICAL DATA: Severe pain right shoulder

EXAM:
MRI CERVICAL SPINE WITHOUT CONTRAST
TECHNIQUE: Multiplanar, multisequence MR imaging of the cervical spine was
performed. No intravenous contrast was administered.

[Series 3: T2 · sagittal · 3.0mm · 0.62mm/px · 6 of 13 slices shown (1 of 2)]
[im 1/13]
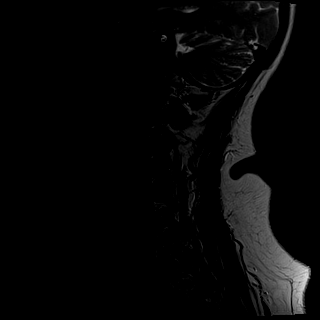
[im 3/13]
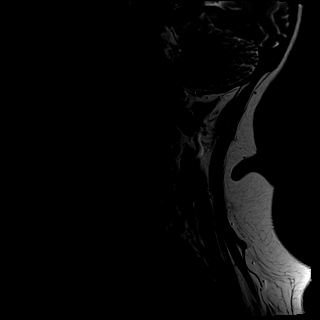
[im 5/13]
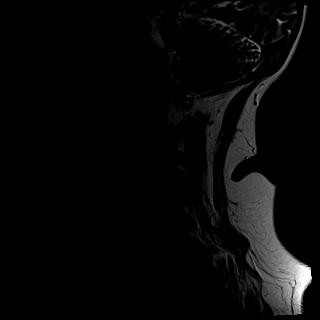
[im 8/13]
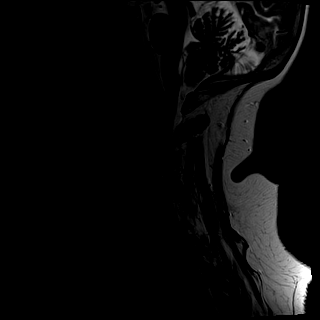
[im 10/13]
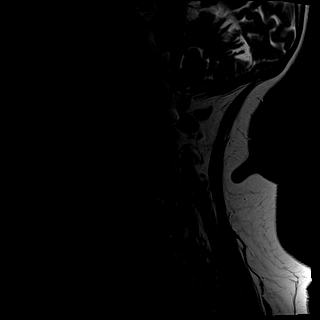
[im 13/13]
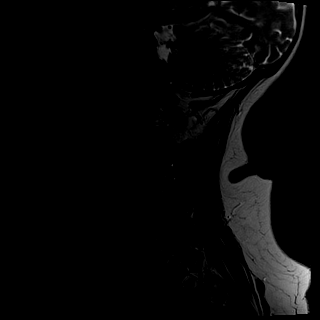

[Series 4: tir sag · sagittal · 3.0mm · 0.39mm/px · 6 of 13 slices shown]
[im 1/13]
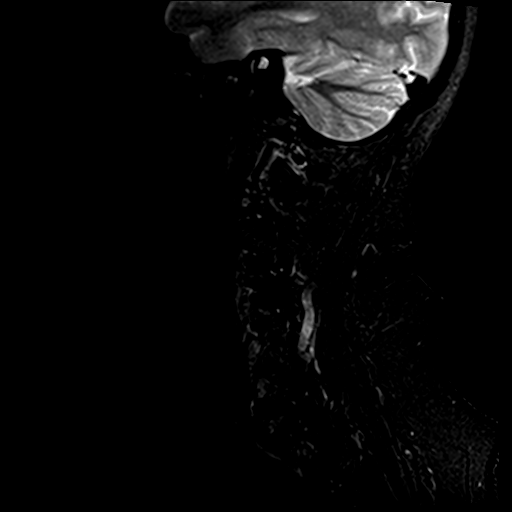
[im 3/13]
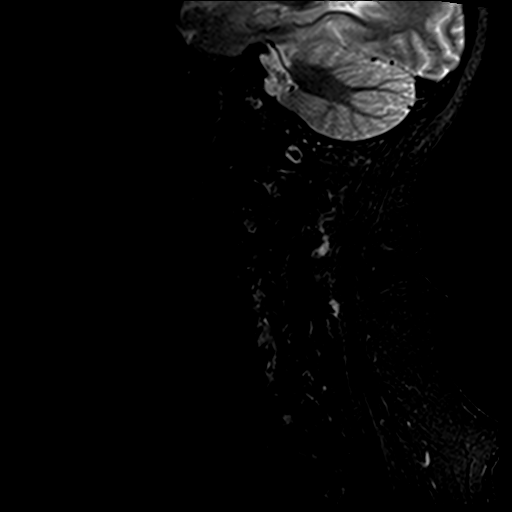
[im 5/13]
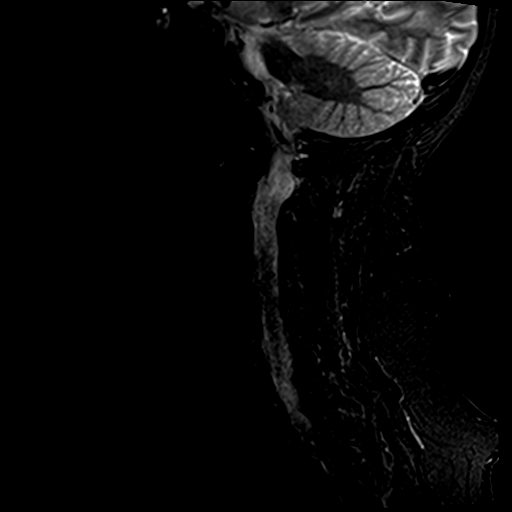
[im 8/13]
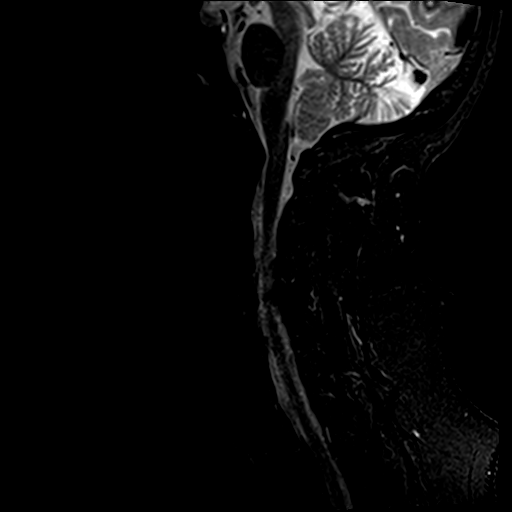
[im 10/13]
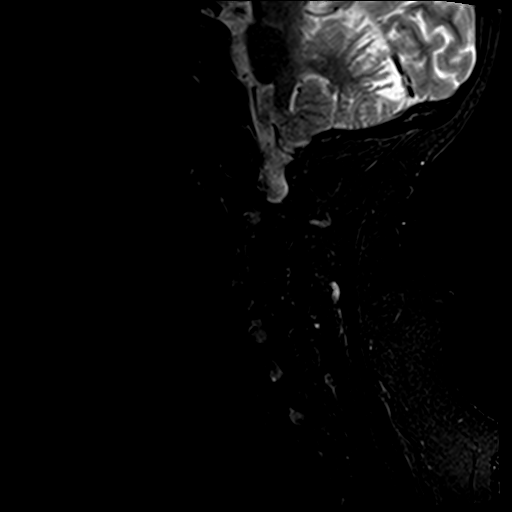
[im 13/13]
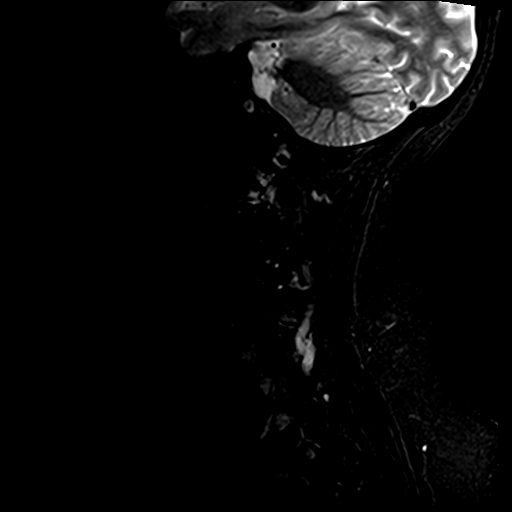

[Series 5: T1 · sagittal · 3.0mm · 0.39mm/px · 6 of 13 slices shown]
[im 1/13]
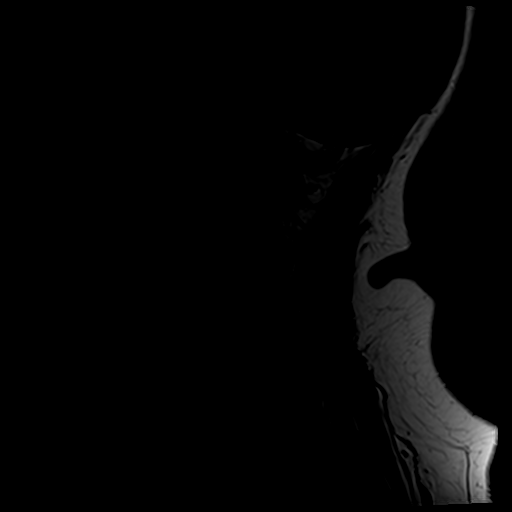
[im 3/13]
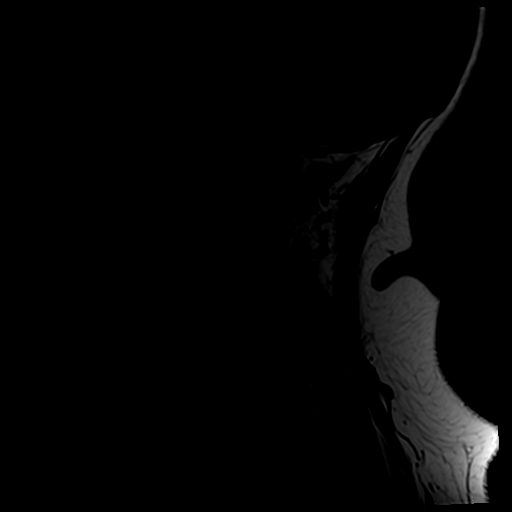
[im 5/13]
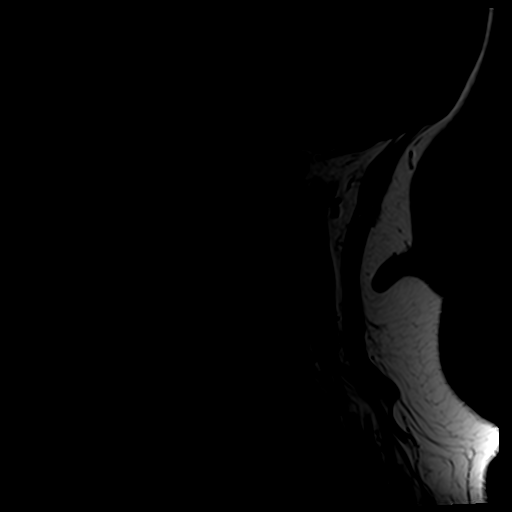
[im 8/13]
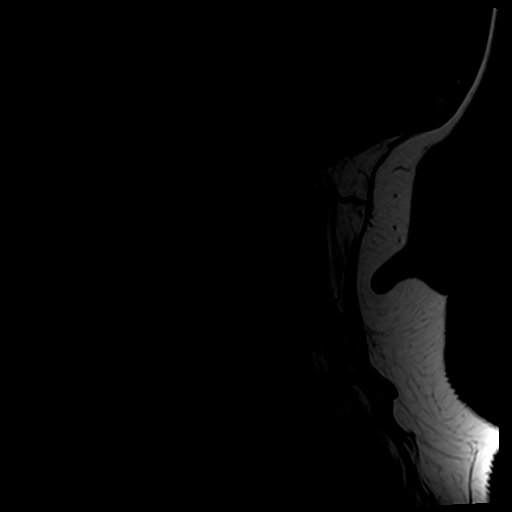
[im 10/13]
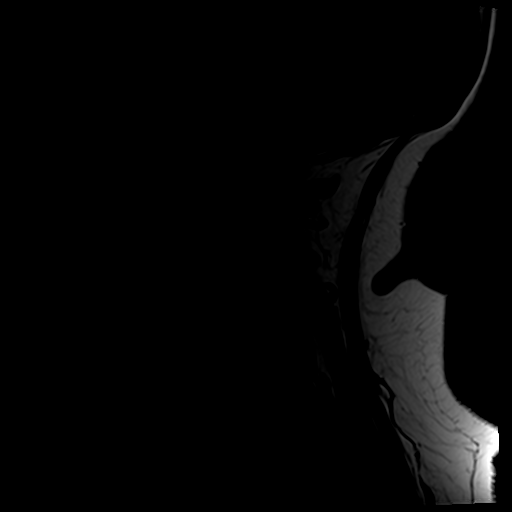
[im 13/13]
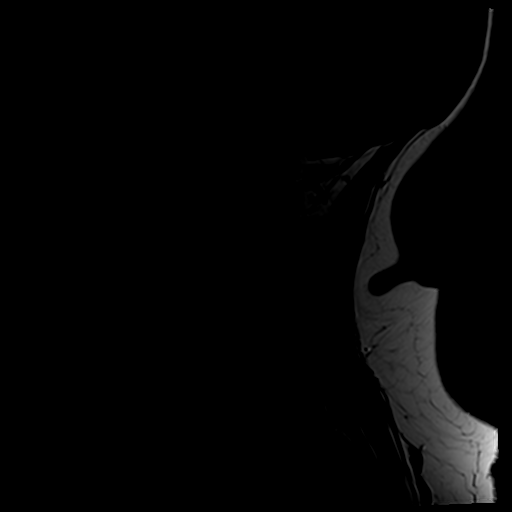

[Series 6: GRE · axial · 3.0mm · 0.37mm/px · z∈[-101,-88]mm · 2 of 30 slices shown]
[im 1/30]
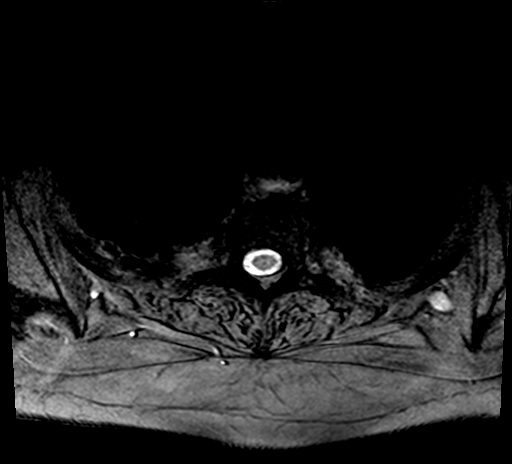
[im 5/30]
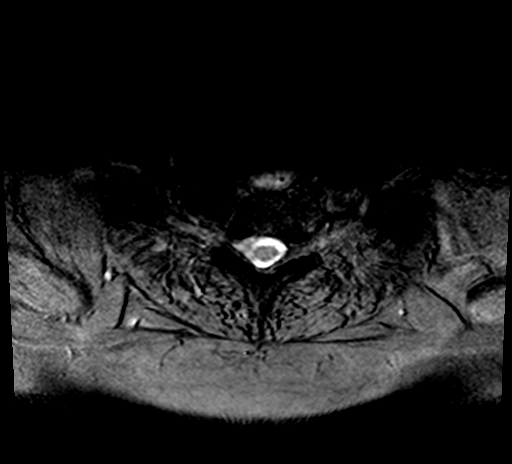

[Series 7: T2 · axial · 3.0mm · 0.74mm/px · z∈[-101,-2]mm · 9 of 30 slices shown (2 of 2)]
[im 1/30]
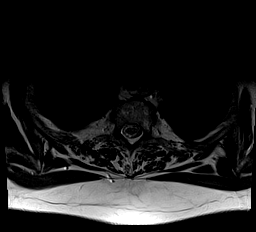
[im 5/30]
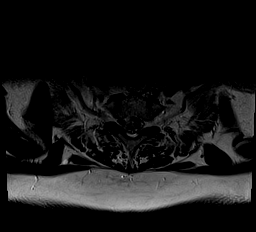
[im 9/30]
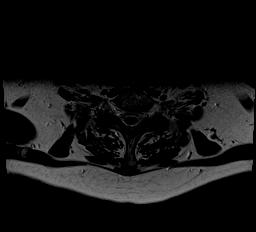
[im 13/30]
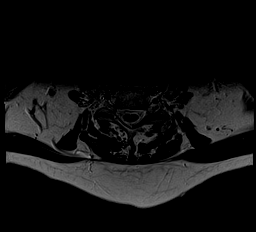
[im 15/30]
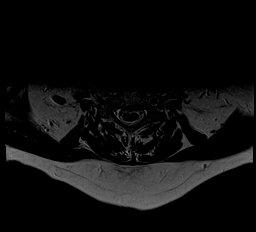
[im 17/30]
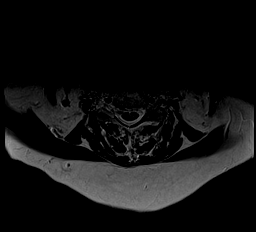
[im 21/30]
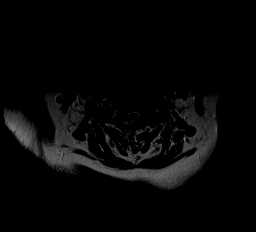
[im 25/30]
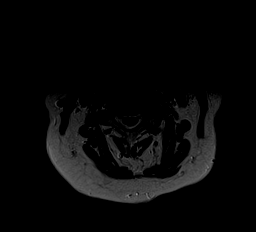
[im 30/30]
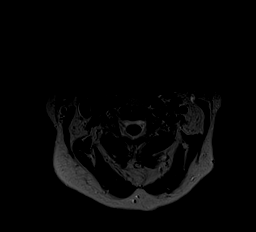

[29 of 48 positions shown; findings below may reference images not displayed]

FINDINGS: Alignment: Normal.

Vertebrae: There is fusion anteriorly from C5-C7.

Cord: Normal caliber and signal.  No syrinx.

Posterior Fossa, vertebral arteries, paraspinal tissues: Visualized
posterior fossa is normal. Vertebral artery flow voids are
preserved. Normal visualized paraspinal soft tissues.

Disc levels:

C1-C2: Normal.

C2-C3: Normal disc space and facet joints. No spinal canal stenosis.
No neuroforaminal stenosis.

C3-C4: Minimal disc bulge. No spinal canal stenosis. No
neuroforaminal stenosis.

C4-C5: Small disc bulge. No spinal canal stenosis. Mild left
neuroforaminal stenosis.

C5-C6: Postfusion changes. No spinal canal stenosis. No
neuroforaminal stenosis.

C6-C7: Postfusion changes. No spinal canal stenosis. No
neuroforaminal stenosis.

C7-T1: Normal disc space and facet joints. No spinal canal stenosis.
No neuroforaminal stenosis.

T1-T2: There is a small right subarticular disc protrusion with
annular fissure (series 7 image 28, series 3 image 6). This causes
mild impression upon the right ventral aspect of the spinal cord. No
spinal canal stenosis.
IMPRESSION: 1. Right subarticular disc protrusion and annular fissure at T1-T2,
new from prior. Given the reported symptoms of right scapular pain,
this may be the symptomatic level.
2. Otherwise unchanged appearance of the cervical spine without
spinal canal stenosis.

## 2018-03-13 DIAGNOSIS — G4709 Other insomnia: Secondary | ICD-10-CM | POA: Diagnosis not present

## 2018-03-13 DIAGNOSIS — F331 Major depressive disorder, recurrent, moderate: Secondary | ICD-10-CM | POA: Diagnosis not present

## 2018-03-13 DIAGNOSIS — F418 Other specified anxiety disorders: Secondary | ICD-10-CM | POA: Diagnosis not present

## 2018-03-13 DIAGNOSIS — L988 Other specified disorders of the skin and subcutaneous tissue: Secondary | ICD-10-CM | POA: Diagnosis not present

## 2018-03-13 DIAGNOSIS — Z23 Encounter for immunization: Secondary | ICD-10-CM | POA: Diagnosis not present

## 2018-03-13 DIAGNOSIS — L728 Other follicular cysts of the skin and subcutaneous tissue: Secondary | ICD-10-CM | POA: Diagnosis not present

## 2018-03-13 DIAGNOSIS — Z6826 Body mass index (BMI) 26.0-26.9, adult: Secondary | ICD-10-CM | POA: Diagnosis not present

## 2018-03-14 DIAGNOSIS — F331 Major depressive disorder, recurrent, moderate: Secondary | ICD-10-CM | POA: Diagnosis not present

## 2018-07-05 DIAGNOSIS — W0110XA Fall on same level from slipping, tripping and stumbling with subsequent striking against unspecified object, initial encounter: Secondary | ICD-10-CM | POA: Diagnosis not present

## 2018-07-05 DIAGNOSIS — R0781 Pleurodynia: Secondary | ICD-10-CM | POA: Diagnosis not present

## 2018-09-19 DIAGNOSIS — F331 Major depressive disorder, recurrent, moderate: Secondary | ICD-10-CM | POA: Diagnosis not present

## 2018-09-19 DIAGNOSIS — F418 Other specified anxiety disorders: Secondary | ICD-10-CM | POA: Diagnosis not present

## 2018-10-10 DIAGNOSIS — E7849 Other hyperlipidemia: Secondary | ICD-10-CM | POA: Diagnosis not present

## 2018-10-10 DIAGNOSIS — E038 Other specified hypothyroidism: Secondary | ICD-10-CM | POA: Diagnosis not present

## 2018-10-16 DIAGNOSIS — J449 Chronic obstructive pulmonary disease, unspecified: Secondary | ICD-10-CM | POA: Diagnosis not present

## 2018-10-16 DIAGNOSIS — E785 Hyperlipidemia, unspecified: Secondary | ICD-10-CM | POA: Diagnosis not present

## 2018-10-16 DIAGNOSIS — F418 Other specified anxiety disorders: Secondary | ICD-10-CM | POA: Diagnosis not present

## 2018-10-16 DIAGNOSIS — M8580 Other specified disorders of bone density and structure, unspecified site: Secondary | ICD-10-CM | POA: Diagnosis not present

## 2018-10-16 DIAGNOSIS — F331 Major depressive disorder, recurrent, moderate: Secondary | ICD-10-CM | POA: Diagnosis not present

## 2018-10-16 DIAGNOSIS — E039 Hypothyroidism, unspecified: Secondary | ICD-10-CM | POA: Diagnosis not present

## 2018-10-16 DIAGNOSIS — Z0000 Encounter for general adult medical examination without abnormal findings: Secondary | ICD-10-CM | POA: Diagnosis not present

## 2018-10-16 DIAGNOSIS — K219 Gastro-esophageal reflux disease without esophagitis: Secondary | ICD-10-CM | POA: Diagnosis not present

## 2018-10-16 DIAGNOSIS — I4891 Unspecified atrial fibrillation: Secondary | ICD-10-CM | POA: Diagnosis not present

## 2018-10-16 DIAGNOSIS — Z86010 Personal history of colonic polyps: Secondary | ICD-10-CM | POA: Diagnosis not present

## 2018-10-16 DIAGNOSIS — Z1331 Encounter for screening for depression: Secondary | ICD-10-CM | POA: Diagnosis not present

## 2018-10-16 DIAGNOSIS — G4700 Insomnia, unspecified: Secondary | ICD-10-CM | POA: Diagnosis not present

## 2018-10-16 DIAGNOSIS — Z800 Family history of malignant neoplasm of digestive organs: Secondary | ICD-10-CM | POA: Diagnosis not present

## 2019-03-10 DIAGNOSIS — Z23 Encounter for immunization: Secondary | ICD-10-CM | POA: Diagnosis not present

## 2019-03-19 DIAGNOSIS — K5792 Diverticulitis of intestine, part unspecified, without perforation or abscess without bleeding: Secondary | ICD-10-CM | POA: Diagnosis not present

## 2019-03-19 DIAGNOSIS — R112 Nausea with vomiting, unspecified: Secondary | ICD-10-CM | POA: Diagnosis not present

## 2019-03-19 DIAGNOSIS — R197 Diarrhea, unspecified: Secondary | ICD-10-CM | POA: Diagnosis not present

## 2019-08-24 DIAGNOSIS — J209 Acute bronchitis, unspecified: Secondary | ICD-10-CM | POA: Diagnosis not present

## 2019-08-24 DIAGNOSIS — R05 Cough: Secondary | ICD-10-CM | POA: Diagnosis not present

## 2019-10-29 DIAGNOSIS — E038 Other specified hypothyroidism: Secondary | ICD-10-CM | POA: Diagnosis not present

## 2019-10-29 DIAGNOSIS — M859 Disorder of bone density and structure, unspecified: Secondary | ICD-10-CM | POA: Diagnosis not present

## 2019-10-29 DIAGNOSIS — E7849 Other hyperlipidemia: Secondary | ICD-10-CM | POA: Diagnosis not present

## 2019-11-02 DIAGNOSIS — K219 Gastro-esophageal reflux disease without esophagitis: Secondary | ICD-10-CM | POA: Diagnosis not present

## 2019-11-02 DIAGNOSIS — Z0000 Encounter for general adult medical examination without abnormal findings: Secondary | ICD-10-CM | POA: Diagnosis not present

## 2019-11-02 DIAGNOSIS — E039 Hypothyroidism, unspecified: Secondary | ICD-10-CM | POA: Diagnosis not present

## 2019-11-02 DIAGNOSIS — J449 Chronic obstructive pulmonary disease, unspecified: Secondary | ICD-10-CM | POA: Diagnosis not present

## 2019-11-02 DIAGNOSIS — E785 Hyperlipidemia, unspecified: Secondary | ICD-10-CM | POA: Diagnosis not present

## 2019-11-02 DIAGNOSIS — M8580 Other specified disorders of bone density and structure, unspecified site: Secondary | ICD-10-CM | POA: Diagnosis not present

## 2019-11-02 DIAGNOSIS — G4700 Insomnia, unspecified: Secondary | ICD-10-CM | POA: Diagnosis not present

## 2019-11-02 DIAGNOSIS — I4891 Unspecified atrial fibrillation: Secondary | ICD-10-CM | POA: Diagnosis not present

## 2019-11-02 DIAGNOSIS — F331 Major depressive disorder, recurrent, moderate: Secondary | ICD-10-CM | POA: Diagnosis not present

## 2019-11-05 ENCOUNTER — Other Ambulatory Visit: Payer: Self-pay | Admitting: Internal Medicine

## 2019-11-05 DIAGNOSIS — Z1231 Encounter for screening mammogram for malignant neoplasm of breast: Secondary | ICD-10-CM

## 2019-11-13 ENCOUNTER — Ambulatory Visit: Payer: Medicare Other

## 2019-12-06 ENCOUNTER — Emergency Department (HOSPITAL_COMMUNITY): Payer: Medicare PPO

## 2019-12-06 ENCOUNTER — Encounter (HOSPITAL_COMMUNITY): Payer: Self-pay | Admitting: Emergency Medicine

## 2019-12-06 ENCOUNTER — Emergency Department (HOSPITAL_COMMUNITY)
Admission: EM | Admit: 2019-12-06 | Discharge: 2019-12-06 | Disposition: A | Discharge status: Home / Self Care | Payer: Medicare PPO | Attending: Emergency Medicine | Admitting: Emergency Medicine

## 2019-12-06 DIAGNOSIS — W19XXXA Unspecified fall, initial encounter: Secondary | ICD-10-CM | POA: Insufficient documentation

## 2019-12-06 DIAGNOSIS — Z87891 Personal history of nicotine dependence: Secondary | ICD-10-CM | POA: Insufficient documentation

## 2019-12-06 DIAGNOSIS — Z79899 Other long term (current) drug therapy: Secondary | ICD-10-CM | POA: Insufficient documentation

## 2019-12-06 DIAGNOSIS — S0191XA Laceration without foreign body of unspecified part of head, initial encounter: Secondary | ICD-10-CM | POA: Insufficient documentation

## 2019-12-06 DIAGNOSIS — M542 Cervicalgia: Secondary | ICD-10-CM | POA: Diagnosis not present

## 2019-12-06 DIAGNOSIS — I959 Hypotension, unspecified: Secondary | ICD-10-CM | POA: Diagnosis not present

## 2019-12-06 DIAGNOSIS — S0990XA Unspecified injury of head, initial encounter: Secondary | ICD-10-CM | POA: Diagnosis present

## 2019-12-06 DIAGNOSIS — Y9301 Activity, walking, marching and hiking: Secondary | ICD-10-CM | POA: Diagnosis not present

## 2019-12-06 DIAGNOSIS — Y9289 Other specified places as the place of occurrence of the external cause: Secondary | ICD-10-CM | POA: Diagnosis not present

## 2019-12-06 DIAGNOSIS — J449 Chronic obstructive pulmonary disease, unspecified: Secondary | ICD-10-CM | POA: Insufficient documentation

## 2019-12-06 DIAGNOSIS — E039 Hypothyroidism, unspecified: Secondary | ICD-10-CM | POA: Diagnosis not present

## 2019-12-06 DIAGNOSIS — R58 Hemorrhage, not elsewhere classified: Secondary | ICD-10-CM | POA: Diagnosis not present

## 2019-12-06 DIAGNOSIS — Y999 Unspecified external cause status: Secondary | ICD-10-CM | POA: Diagnosis not present

## 2019-12-06 DIAGNOSIS — R519 Headache, unspecified: Secondary | ICD-10-CM | POA: Diagnosis not present

## 2019-12-06 DIAGNOSIS — S80211A Abrasion, right knee, initial encounter: Secondary | ICD-10-CM | POA: Insufficient documentation

## 2019-12-06 DIAGNOSIS — Z85118 Personal history of other malignant neoplasm of bronchus and lung: Secondary | ICD-10-CM | POA: Diagnosis not present

## 2019-12-06 DIAGNOSIS — R410 Disorientation, unspecified: Secondary | ICD-10-CM | POA: Diagnosis not present

## 2019-12-06 DIAGNOSIS — S060X9A Concussion with loss of consciousness of unspecified duration, initial encounter: Secondary | ICD-10-CM | POA: Diagnosis not present

## 2019-12-06 DIAGNOSIS — F0781 Postconcussional syndrome: Secondary | ICD-10-CM | POA: Diagnosis not present

## 2019-12-06 DIAGNOSIS — M25561 Pain in right knee: Secondary | ICD-10-CM | POA: Diagnosis not present

## 2019-12-06 LAB — COMPREHENSIVE METABOLIC PANEL
ALT: 21 U/L (ref 0–44)
AST: 24 U/L (ref 15–41)
Albumin: 3.9 g/dL (ref 3.5–5.0)
Alkaline Phosphatase: 46 U/L (ref 38–126)
Anion gap: 12 (ref 5–15)
BUN: 15 mg/dL (ref 8–23)
CO2: 21 mmol/L — ABNORMAL LOW (ref 22–32)
Calcium: 9 mg/dL (ref 8.9–10.3)
Chloride: 106 mmol/L (ref 98–111)
Creatinine, Ser: 0.8 mg/dL (ref 0.44–1.00)
GFR calc Af Amer: >60 mL/min (ref >60)
GFR calc non Af Amer: >60 mL/min (ref >60)
Glucose, Bld: 92 mg/dL (ref 70–99)
Potassium: 3.2 mmol/L — ABNORMAL LOW (ref 3.5–5.1)
Sodium: 139 mmol/L (ref 135–145)
Total Bilirubin: 1.2 mg/dL (ref 0.3–1.2)
Total Protein: 6.7 g/dL (ref 6.5–8.1)

## 2019-12-06 LAB — CBC
HCT: 36.8 % (ref 36.0–46.0)
Hemoglobin: 12 g/dL (ref 12.0–15.0)
MCH: 27.2 pg (ref 26.0–34.0)
MCHC: 32.6 g/dL (ref 30.0–36.0)
MCV: 83.4 fL (ref 80.0–100.0)
Platelets: 275 10*3/uL (ref 150–400)
RBC: 4.41 MIL/uL (ref 3.87–5.11)
RDW: 16.8 % — ABNORMAL HIGH (ref 11.5–15.5)
WBC: 8.5 10*3/uL (ref 4.0–10.5)
nRBC: 0 % (ref 0.0–0.2)

## 2019-12-06 LAB — ETHANOL: Alcohol, Ethyl (B): <10 mg/dL (ref <10)

## 2019-12-06 MED ORDER — SODIUM CHLORIDE 0.9 % IV BOLUS
1000.0000 mL | Freq: Once | INTRAVENOUS | Status: AC
  Administered 2019-12-06: 1000 mL via INTRAVENOUS

## 2019-12-06 NOTE — ED Provider Notes (Signed)
[facility]ERGENCY DEPARTMENT Provider Note   CSN: [id number] Arrival date & time: 12/06/19  1757     History Chief Complaint  Patient presents with  . Fall    Andrea Morgan is a 69 y.o. female.  The history is provided by the patient.  Fall This is a new problem. The current episode started 3 to 5 hours ago. The problem occurs rarely. The problem has not changed since onset.Pertinent negatives include no chest pain, no abdominal pain, no headaches and no shortness of breath. Nothing aggravates the symptoms. Nothing relieves the symptoms. She has tried nothing for the symptoms. The treatment provided no relief.    Past Medical History:  Diagnosis Date  . Adrenal adenoma   . Anal condyloma   . Anemia   . Anxiety   . Arthritis    arthritis left jaw / pain    . Cancer [facility]) 2013   lung cancer  . DDD (degenerative disc disease), thoracic   . Depression   . Dyspnea on exertion   . Emphysema/COPD ([place])   . GERD (gastroesophageal reflux disease)    watches diet  . Hiatal hernia   . History of adenomatous polyp of colon    06/ 2016 hyperplastic  . 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 History of condyloma acuminatum    removal anal canal condyloma 11-08-2013  . History of kidney stones   . History of panic attacks   . Hyperlipidemia   . Hypothyroidism   . Insomnia   . Liver cyst    per CT in 2012  . PAF (paroxysmal atrial fibrillation) [facility]) cardiologist-  dr Mare Ferrari   single episode Atrial Fibrillation w/ RVR post-op day 3 the right colectomy surgery on 11-08-2013      . PONV (postoperative nausea and vomiting)   . Pulmonary nodule, right    right upper lobe per last Chest CT 05-14-2015    Patient Active Problem List   Diagnosis Date Noted  . Thoracic  disc herniation 01/13/2016  . Chest pain 08/28/2014  . Chronic headaches   . Anxiety   . Cephalalgia   . Hypercholesterolemia 03/11/2014  . Atrial fibrillation with RVR - post-operative  11/12/2013  . Personal history of colonic polyps 11/08/2013  . PAF (paroxysmal atrial fibrillation) ([place]) 10/17/2013  . COPD (chronic obstructive pulmonary disease) ([place]) 04/26/2013  . 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 Chest mass 05/04/2011    Past Surgical History:  Procedure Laterality Date  . APPENDECTOMY  1978  . CERVICAL FUSION  1997   C5 -- C7  . COLONOSCOPY  last one 10-17-2014  . ESOPHAGOGASTRODUODENOSCOPY  05/21/2011   Procedure: ESOPHAGOGASTRODUODENOSCOPY (EGD);  Surgeon: Beryle Beams, MD;  Location: [facility];  Service: Endoscopy;  Laterality: N/A;  . EXAMINATION UNDER ANESTHESIA N/A 11/08/2013   Procedure: ANAL EXAM UNDER ANESTHESIA WITH BIOPSY;  Surgeon: [name], MD;  Location: [facility];  Service: General;  Laterality: N/A;  . LAPAROSCOPIC PARTIAL COLECTOMY N/A 11/08/2013   Procedure: laparoscopic partial right colectomy;  Surgeon: [name], MD;  Location: [facility];  Service: General;  Laterality: N/A;  . MASS EXCISION  06/02/2011   Procedure: EXCISION MASS;  Surgeon: Grace Isaac, MD;  Location: [facility];  Service: Thoracic;;  minithoracotomy resection spindle cell tumor right chest  . MASS EXCISION N/A 05/06/2016   Procedure: EXCISION ANAL CONDYLOMA;  Surgeon: [name], MD;  Location: [facility];  Service: General;  Laterality: N/A;  . THORACIC DISCECTOMY Right 01/13/2016   Procedure: RIGHT THORACIC ONE THORACIC TWO THORACIC LAMINOTOMY AND MICRODISCECTOMY;  Surgeon: Jovita Gamma, MD;  Location: [facility];  Service: Neurosurgery;  Laterality: Right;  . TONSILLECTOMY  1971  . TRANSTHORACIC ECHOCARDIOGRAM  08/28/2014   ef 55-60%/  trivial MR and TR  . VIDEO BRONCHOSCOPY  05/11/2011   Procedure: VIDEO  BRONCHOSCOPY WITH FLUORO;  Surgeon: Collene Gobble, MD;  Location: [facility];  Service: Cardiopulmonary;  Laterality: Bilateral;  . VIDEO BRONCHOSCOPY  06/02/2011   Procedure: VIDEO BRONCHOSCOPY;  Surgeon: Grace Isaac, MD;  Location: [facility];  Service: Thoracic;  Laterality: N/A;     OB History   No obstetric history on file.     Family History  Problem Relation Age of Onset  . Asthma Mother   . Heart disease Mother   . Lung cancer Father   . Asthma Maternal Grandmother   . Ovarian cancer Maternal Aunt   . Breast cancer Maternal Aunt        x2  . Prostate cancer Maternal Uncle   . Kidney cancer Sister   . Colon cancer Brother        late 59's  . Anesthesia problems Neg Hx   . Esophageal cancer Neg Hx   . Stomach cancer Neg Hx     Social History   Tobacco Use  . Smoking status: Former Smoker    Packs/day: 1.00    Years: 45.00    Pack years: 45.00    Types: Cigarettes    Quit date: 04/13/2012    Years since quitting: 7.6  . Smokeless tobacco: Never Used  Vaping Use  . Vaping Use: Never used  Substance Use Topics  . Alcohol use: No  . Drug use: Yes    Types: Marijuana    Comment: daily use per pt    Home Medications Prior to Admission medications   Medication Sig Start Date End Date Taking? Authorizing Provider  aspirin EC 81 MG tablet Take 81 mg by mouth daily.    [provider]  atorvastatin (LIPITOR) 20 MG tablet Take 20 mg by mouth at bedtime.     [provider]  carvedilol (COREG) 3.125 MG tablet Take 3.125 mg by mouth every morning.     [provider]  Cholecalciferol (VITAMIN D) 2000 units tablet Take 2,000 Units by mouth daily.    [provider]  diltiazem (CARDIZEM CD) 120 MG 24 hr capsule TAKE 1 CAPSULE (120 MG TOTAL) BY MOUTH DAILY. 09/06/16   Burtis Junes, NP  escitalopram (LEXAPRO) 20 MG tablet Take 20 mg by mouth every evening.  12/11/15   [provider]  levothyroxine (SYNTHROID,  LEVOTHROID) 50 MCG tablet Take 50 mcg by mouth daily before breakfast.  12/11/15   [provider]  LORazepam (ATIVAN) 1 MG tablet Take 1 mg by mouth daily as needed for anxiety.  08/01/15   [provider]  naproxen sodium (ANAPROX) 220 MG tablet Take 440 mg by mouth every 12 (twelve) hours as needed (pain).    [provider]  omeprazole (PRILOSEC) 20 MG capsule Take 20 mg by mouth daily.    [provider]  vitamin B-12 (CYANOCOBALAMIN) 1000 MCG tablet Take 3,000 mcg by  mouth daily.    [provider]  zolpidem (AMBIEN) 10 MG tablet Take 10 mg by mouth at bedtime.     [provider]    Allergies    Oxycodone, Sulfa antibiotics, and Morphine and related  Review of Systems   Review of Systems  Constitutional: Negative for chills and fever.  HENT: Negative for ear pain and sore throat.   Eyes: Negative for pain and visual disturbance.  Respiratory: Negative for cough and shortness of breath.   Cardiovascular: Negative for chest pain and palpitations.  Gastrointestinal: Negative for abdominal pain and vomiting.  Genitourinary: Negative for dysuria and hematuria.  Musculoskeletal: Negative for arthralgias and back pain.       R knee pain.  Neck pain  Skin: Negative for color change and rash.  Neurological: Negative for seizures, syncope and headaches.  Psychiatric/Behavioral: Positive for confusion.  All other systems reviewed and are negative.   Physical Exam Updated Vital Signs BP (!) 149/74   Pulse 72   Temp 98.7 F (37.1 C)   Resp 16   Ht 5\' 6"  (1.676 m)   Wt 74.8 kg   SpO2 100%   BMI 26.62 kg/m   Physical Exam Vitals and nursing note reviewed.  Constitutional:      General: She is not in acute distress.    Appearance: She is well-developed.  HENT:     Head: Normocephalic.     Comments: 2cm well approximated laceration to the r forehead held together w/ Steri strip. Eyes:     Conjunctiva/sclera: Conjunctivae  normal.  Cardiovascular:     Rate and Rhythm: Normal rate and regular rhythm.     Heart sounds: No murmur heard.   Pulmonary:     Effort: Pulmonary effort is normal. No respiratory distress.     Breath sounds: Normal breath sounds.  Abdominal:     Palpations: Abdomen is soft.     Tenderness: There is no abdominal tenderness.  Musculoskeletal:     Cervical back: Neck supple.     Comments: Abrasion and tenderness to R knee.  Skin:    General: Skin is warm and dry.     Capillary Refill: Capillary refill takes less than 2 seconds.  Neurological:     Mental Status: She is alert.     Comments: Oriented to self and type of location but not year, month.  Slightly decreased R sided strength in UE/LE however pt states this is chronic.  Psychiatric:        Mood and Affect: Mood normal.        Behavior: Behavior normal.     ED Results / Procedures / Treatments   Labs (all labs ordered are listed, but only abnormal results are displayed) Labs Reviewed  CBC - Abnormal; Notable for the following components:      Result Value   RDW 16.8 (*)    All other components within normal limits  COMPREHENSIVE METABOLIC PANEL - Abnormal; Notable for the following components:   Potassium 3.2 (*)    CO2 21 (*)    All other components within normal limits  ETHANOL  URINALYSIS, ROUTINE W REFLEX MICROSCOPIC  RAPID URINE DRUG SCREEN, HOSP PERFORMED    EKG None  Radiology DG Knee 2 Views Right  Result Date: 12/06/2019 CLINICAL DATA:  Right knee pain after fall EXAM: RIGHT KNEE - 1-2 VIEW COMPARISON:  None. FINDINGS: No evidence of fracture, dislocation, or joint effusion. No evidence of arthropathy or other focal bone abnormality. Soft  tissues are unremarkable. IMPRESSION: Negative. Electronically Signed   By: Ulyses Jarred M.D.   On: 12/06/2019 19:27   CT Head Wo Contrast  Result Date: 12/06/2019 CLINICAL DATA:  Head and neck pain after fall EXAM: CT HEAD WITHOUT CONTRAST CT CERVICAL SPINE  WITHOUT CONTRAST TECHNIQUE: Multidetector CT imaging of the head and cervical spine was performed following the standard protocol without intravenous contrast. Multiplanar CT image reconstructions of the cervical spine were also generated. COMPARISON:  None. FINDINGS: CT HEAD FINDINGS Brain: No acute infarct or hemorrhage. Lateral ventricles and midline structures are unremarkable. No acute extra-axial fluid collections. No mass effect. Vascular: No hyperdense vessel or unexpected calcification. Skull: Normal. Negative for fracture or focal lesion. Sinuses/Orbits: No acute finding. Other: None. CT CERVICAL SPINE FINDINGS Alignment: Alignment is grossly anatomic. Skull base and vertebrae: No acute displaced fracture. Soft tissues and spinal canal: No prevertebral fluid or swelling. No visible canal hematoma. Disc levels: There is bony fusion from C5 through C7. Mild spondylosis at C3-4 and C4-5. Left predominant facet hypertrophy throughout the cervical spine. Upper chest: Airway is patent. Emphysema and scarring noted at the lung apices. Other: Reconstructed images demonstrate no additional findings. IMPRESSION: 1. No acute intracranial process. 2. No acute cervical spine fracture. 3. Multilevel cervical spondylosis, with bony fusion from C5 through C7. Electronically Signed   By: Randa Ngo M.D.   On: 12/06/2019 20:13   CT Cervical Spine Wo Contrast  Result Date: 12/06/2019 CLINICAL DATA:  Head and neck pain after fall EXAM: CT HEAD WITHOUT CONTRAST CT CERVICAL SPINE WITHOUT CONTRAST TECHNIQUE: Multidetector CT imaging of the head and cervical spine was performed following the standard protocol without intravenous contrast. Multiplanar CT image reconstructions of the cervical spine were also generated. COMPARISON:  None. FINDINGS: CT HEAD FINDINGS Brain: No acute infarct or hemorrhage. Lateral ventricles and midline structures are unremarkable. No acute extra-axial fluid collections. No mass effect.  Vascular: No hyperdense vessel or unexpected calcification. Skull: Normal. Negative for fracture or focal lesion. Sinuses/Orbits: No acute finding. Other: None. CT CERVICAL SPINE FINDINGS Alignment: Alignment is grossly anatomic. Skull base and vertebrae: No acute displaced fracture. Soft tissues and spinal canal: No prevertebral fluid or swelling. No visible canal hematoma. Disc levels: There is bony fusion from C5 through C7. Mild spondylosis at C3-4 and C4-5. Left predominant facet hypertrophy throughout the cervical spine. Upper chest: Airway is patent. Emphysema and scarring noted at the lung apices. Other: Reconstructed images demonstrate no additional findings. IMPRESSION: 1. No acute intracranial process. 2. No acute cervical spine fracture. 3. Multilevel cervical spondylosis, with bony fusion from C5 through C7. Electronically Signed   By: Randa Ngo M.D.   On: 12/06/2019 20:13    Procedures Procedures (including critical care time)  Medications Ordered in ED Medications  sodium chloride 0.9 % bolus 1,000 mL (0 mLs Intravenous Stopped 12/06/19 2155)    ED Course  I have reviewed the triage vital signs and the nursing notes.  Pertinent labs & imaging results that were available during my care of the patient were reviewed by me and considered in my medical decision making (see chart for details).    MDM Rules/Calculators/A&P                          69 year old female with no relevant past medical history presents with a fall and confusion.  Patient states that she was walking today when she fell after tripping on a slippery floor.  Patient denies any headache, weakness, numbness, chest pain, shortness of breath or other prodromal symptoms prior to her fall.  Patient states that she suffered a laceration to the right side of her head which was repaired by her friend who is a Marine scientist.  [place] did [place] to [employer] where afterward she cannot find her car.  Stated that she got in the car with  another individual however was unable to find her car or remember her address at that time.  Patient was then transferred to [facility] for further evaluation and management.  Currently endorsing neck pain as well as right knee pain but denies chest pain, shortness of breath, abdominal pain, headache, new numbness, new weakness.  Patient does states she has chronic right-sided weakness from an unknown cause.  Afebrile vital signs stable.  Exam as above.  Patient does have a Steri-Stripped laceration to the right side of her forehead and midline C-spine tenderness.  Right knee also has an abrasion and is slightly tender to palpation.  Patient is not oriented to year or month which she says is not her baseline.  Denies any drug or alcohol use.  C-spine was placed in labs as well as imaging of her head and neck were ordered.  CT head, C-spine negative for acute injuries.  X-ray of the right knee showed no acute abnormalities.  Ethanol less than 10.  CMP, CBC unremarkable.  Upon reevaluation patient's mental status had improved and patient's friend who repaired her head was at the bedside.  Patient's friend states that she seemed a little bit confused however this seems greatly improved from earlier.  Patient is able to give a more accurate report of what happened earlier and her mental status appears to be improving.  Patient's friend states that she can take patient home and watch her to make sure that her mental status continues to improve.  Given her negative imaging work-up at this time I feel that this is reasonable.  Patient's altered mental status is likely secondary to postconcussive syndrome.  Patient's friend is a Marine scientist and I feel she is reliable and can watch the patient to evaluate for further worsening of her mental status.  I discussed return precautions with the patient as well as her friend and both of them voiced agreement and understanding of the overall plan.  Patient was discharged in stable  condition without further events.  Final Clinical Impression(s) / ED Diagnoses Final diagnoses:  Fall, initial encounter  Post concussive syndrome    Rx / DC Orders ED Discharge Orders    None       Silvestre Gunner, MD 12/06/19 2249    [name], MD 12/10/19 [phone]

## 2019-12-06 NOTE — Discharge Instructions (Addendum)
Take tylenol, motrin for headaches   Please stay with someone tonight to be observed   See your doctor   Return to ER if you have worse confusion, dizziness, lethargy

## 2019-12-06 NOTE — ED Notes (Signed)
Pt given discharge instructions & verbalize understanding of instruction pt out of the ED by wheel chair accompany by family member

## 2019-12-06 NOTE — ED Triage Notes (Signed)
BIB EMS after patient was found to be confused. Patient had fall at work earlier today and was able to drive self to [employer] after work. Found wandering parking lot by bystander who attempted to drive patient home but was unable to state address. No thinners. VSS. CBG 98

## 2020-03-18 DIAGNOSIS — S060X9D Concussion with loss of consciousness of unspecified duration, subsequent encounter: Secondary | ICD-10-CM | POA: Diagnosis not present

## 2020-03-18 DIAGNOSIS — Z23 Encounter for immunization: Secondary | ICD-10-CM | POA: Diagnosis not present

## 2020-03-18 DIAGNOSIS — F331 Major depressive disorder, recurrent, moderate: Secondary | ICD-10-CM | POA: Diagnosis not present

## 2020-03-18 DIAGNOSIS — L729 Follicular cyst of the skin and subcutaneous tissue, unspecified: Secondary | ICD-10-CM | POA: Diagnosis not present

## 2020-03-18 DIAGNOSIS — R413 Other amnesia: Secondary | ICD-10-CM | POA: Diagnosis not present

## 2020-09-17 ENCOUNTER — Other Ambulatory Visit: Payer: Self-pay | Admitting: Internal Medicine

## 2020-09-17 ENCOUNTER — Other Ambulatory Visit (HOSPITAL_COMMUNITY): Payer: Self-pay | Admitting: Internal Medicine

## 2020-09-17 DIAGNOSIS — R410 Disorientation, unspecified: Secondary | ICD-10-CM

## 2020-09-18 ENCOUNTER — Ambulatory Visit (HOSPITAL_COMMUNITY)
Admission: RE | Admit: 2020-09-18 | Discharge: 2020-09-18 | Disposition: A | Discharge status: Home / Self Care | Payer: Medicare Other | Source: Ambulatory Visit | Attending: Internal Medicine | Admitting: Internal Medicine

## 2020-09-18 ENCOUNTER — Other Ambulatory Visit: Payer: Self-pay

## 2020-09-18 DIAGNOSIS — R410 Disorientation, unspecified: Secondary | ICD-10-CM | POA: Diagnosis present

## 2020-11-17 ENCOUNTER — Other Ambulatory Visit: Payer: Self-pay | Admitting: Internal Medicine

## 2020-11-17 DIAGNOSIS — F17201 Nicotine dependence, unspecified, in remission: Secondary | ICD-10-CM

## 2020-12-25 ENCOUNTER — Encounter: Payer: Self-pay | Admitting: *Deleted

## 2020-12-29 ENCOUNTER — Ambulatory Visit (INDEPENDENT_AMBULATORY_CARE_PROVIDER_SITE_OTHER): Payer: Medicare Other | Admitting: Diagnostic Neuroimaging

## 2020-12-29 ENCOUNTER — Encounter: Payer: Self-pay | Admitting: Diagnostic Neuroimaging

## 2020-12-29 ENCOUNTER — Ambulatory Visit: Payer: Medicare Other | Admitting: Neurology

## 2020-12-29 VITALS — BP 131/77 | HR 74 | Ht 67.0 in | Wt 151.0 lb

## 2020-12-29 DIAGNOSIS — G3184 Mild cognitive impairment, so stated: Secondary | ICD-10-CM

## 2020-12-29 DIAGNOSIS — G4700 Insomnia, unspecified: Secondary | ICD-10-CM | POA: Diagnosis not present

## 2020-12-29 NOTE — Progress Notes (Signed)
[facility]  PATIENT: Andrea Morgan DOB: [birth year]  REFERRING CLINICIAN: [name], MD HISTORY FROM: patient  REASON FOR VISIT: new consult    HISTORICAL  CHIEF COMPLAINT:  Chief Complaint  Patient presents with   Memory Loss    Rm 6 New Pt, sister-Fran  MMSE 21    HISTORY OF PRESENT ILLNESS:   70 year old female here for evaluation of memory loss.  August 2021 patient was at home, tripped and fell hit her head.  Afterwards she went to the store and was found walking around confused.  She was brought to the hospital.  She was treated for a 2 cm laceration and concussion symptoms.  Since that time patient has had progressive short-term memory loss and confusion.  Has had some decline in ADLs.  REVIEW OF SYSTEMS: Full 14 system review of systems performed and negative with exception of: as per HPI.  ALLERGIES: Allergies  Allergen Reactions   Oxycodone Itching   Sulfa Antibiotics Hives   Morphine And Related Itching    HOME MEDICATIONS: Outpatient Medications Prior to Visit  Medication Sig Dispense Refill   alendronate (FOSAMAX) 70 MG tablet 1 tablet 30 minutes before the first food, beverage or medicine of the day with plain water     aspirin EC 81 MG tablet Take 81 mg by mouth daily.     atorvastatin (LIPITOR) 20 MG tablet Take 20 mg by mouth at bedtime.      Cholecalciferol (VITAMIN D) 2000 units tablet Take 2,000 Units by mouth daily.     escitalopram (LEXAPRO) 20 MG tablet Take 20 mg by mouth every evening.   3   levothyroxine (SYNTHROID) 88 MCG tablet Take 88 mcg by mouth every morning.     LORazepam (ATIVAN) 2 MG tablet 1 tablet     zolpidem (AMBIEN) 10 MG tablet Take 10 mg by mouth at bedtime.      albuterol (VENTOLIN HFA) 108 (90 Base) MCG/ACT inhaler 1 puff q6 hours as needed for sob/wheezing (Patient not taking: Reported on 12/29/2020)     buPROPion (WELLBUTRIN SR) 150 MG 12 hr tablet Take 1 tablet by mouth daily. (Patient not  taking: Reported on 12/29/2020)     carvedilol (COREG) 3.125 MG tablet Take 3.125 mg by mouth every morning.  (Patient not taking: Reported on 12/29/2020)     diltiazem (CARDIZEM CD) 120 MG 24 hr capsule TAKE 1 CAPSULE (120 MG TOTAL) BY MOUTH DAILY. (Patient not taking: Reported on 12/29/2020) 30 capsule 7   levothyroxine (SYNTHROID, LEVOTHROID) 50 MCG tablet Take 50 mcg by mouth daily before breakfast.  (Patient not taking: Reported on 12/29/2020)  3   naproxen sodium (ANAPROX) 220 MG tablet Take 440 mg by mouth every 12 (twelve) hours as needed (pain). (Patient not taking: Reported on 12/29/2020)     ondansetron (ZOFRAN) 4 MG tablet 1 pill every 6 hours as needed for nausea (Patient not taking: Reported on 12/29/2020)     pantoprazole (PROTONIX) 40 MG tablet Take 1 tablet by mouth daily. (Patient not taking: Reported on 12/29/2020)     vitamin B-12 (CYANOCOBALAMIN) 1000 MCG tablet Take 5,000 mcg by mouth daily. (Patient not taking: Reported on 12/29/2020)     LORazepam (ATIVAN) 1 MG tablet Take 1 mg by mouth daily as needed for anxiety.   3   Facility-Administered Medications Prior to Visit  Medication Dose Route Frequency Provider Last Rate Last Admin   0.9 %  sodium chloride infusion  500 mL Intravenous Once Pyrtle,  Lajuan Lines, MD        PAST MEDICAL HISTORY: Past Medical History:  Diagnosis Date   Adrenal adenoma    Anal condyloma    Anemia    Anxiety    Arthritis    arthritis left jaw / pain     Cancer ([place]) 2013   lung cancer   DDD (degenerative disc disease), thoracic    Depression    Dyspnea on exertion    Emphysema/COPD (HCC)    GERD (gastroesophageal reflux disease)    watches diet   Hiatal hernia    History of adenomatous polyp of colon    06/ 2016 hyperplastic   History of cancer of lower lobe bronchus or lung followed by dr gerhardt-- lov 01/ 2017   06-02-2011  s/p  Right VATS w/ left mini thoracotomy resection 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)    History of condyloma acuminatum    removal anal canal condyloma 11-08-2013   History of kidney stones    History of panic attacks    Hyperlipidemia    Hypothyroidism    Insomnia    Liver cyst    per CT in 2012   PAF (paroxysmal atrial fibrillation) [facility]) cardiologist-  dr Mare Ferrari   single episode Atrial Fibrillation w/ RVR post-op day 3 the right colectomy surgery on 11-08-2013       PONV (postoperative nausea and vomiting)    Pulmonary nodule, right    right upper lobe per last Chest CT 05-14-2015    PAST SURGICAL HISTORY: Past Surgical History:  Procedure Laterality Date   APPENDECTOMY  1978   CERVICAL FUSION  1997   C5 -- C7   COLONOSCOPY  last one 10-17-2014   ESOPHAGOGASTRODUODENOSCOPY  05/21/2011   Procedure: ESOPHAGOGASTRODUODENOSCOPY (EGD);  Surgeon: Beryle Beams, MD;  Location: [facility];  Service: Endoscopy;  Laterality: N/A;   EXAMINATION UNDER ANESTHESIA N/A 11/08/2013   Procedure: ANAL EXAM UNDER ANESTHESIA WITH BIOPSY;  Surgeon: [name], MD;  Location: [facility];  Service: General;  Laterality: N/A;   LAPAROSCOPIC PARTIAL COLECTOMY N/A 11/08/2013   Procedure: laparoscopic partial right colectomy;  Surgeon: [name], MD;  Location: [facility];  Service: General;  Laterality: N/A;   MASS EXCISION  06/02/2011   Procedure: EXCISION MASS;  Surgeon: Grace Isaac, MD;  Location: [facility];  Service: Thoracic;;  minithoracotomy resection spindle cell tumor right chest   MASS EXCISION N/A 05/06/2016   Procedure: EXCISION ANAL CONDYLOMA;  Surgeon: [name], MD;  Location: [facility];  Service: General;  Laterality: N/A;   THORACIC DISCECTOMY Right 01/13/2016   Procedure: RIGHT THORACIC ONE THORACIC TWO THORACIC LAMINOTOMY AND MICRODISCECTOMY;  Surgeon: Jovita Gamma, MD;  Location: [facility];  Service: Neurosurgery;  Laterality: Right;   TONSILLECTOMY  1971   TRANSTHORACIC ECHOCARDIOGRAM  08/28/2014   ef 55-60%/  trivial MR and TR   VIDEO  BRONCHOSCOPY  05/11/2011   Procedure: VIDEO BRONCHOSCOPY WITH FLUORO;  Surgeon: Collene Gobble, MD;  Location: [facility];  Service: Cardiopulmonary;  Laterality: Bilateral;   VIDEO BRONCHOSCOPY  06/02/2011   Procedure: VIDEO BRONCHOSCOPY;  Surgeon: Grace Isaac, MD;  Location: [facility];  Service: Thoracic;  Laterality: N/A;    FAMILY HISTORY: Family History  Problem Relation Age of Onset   Asthma Mother    Heart disease Mother    Lung cancer Father    Kidney cancer Sister    Cancer Sister  kidney   Emphysema Sister    Colon cancer Brother        late 63's   Liver cancer Brother    Asthma Maternal Grandmother    Ovarian cancer Maternal Aunt    Breast cancer Maternal Aunt        x2   Prostate cancer Maternal Uncle    Anesthesia problems Neg Hx    Esophageal cancer Neg Hx    Stomach cancer Neg Hx     SOCIAL HISTORY: Social History   Socioeconomic History   Marital status: Divorced    Spouse name: Not on file   Number of children: 1   Years of education: 12   Highest education level: Not on file  Occupational History   Occupation: caregiver    Employer: [employer]  Tobacco Use   Smoking status: Former    Packs/day: 1.00    Years: 45.00    Pack years: 45.00    Types: Cigarettes    Quit date: 04/13/2012    Years since quitting: 8.7   Smokeless tobacco: Never  Vaping Use   Vaping Use: Never used  Substance and Sexual Activity   Alcohol use: No   Drug use: Yes    Types: Marijuana    Comment: 12/29/20 daily use per pt   Sexual activity: Not on file  Other Topics Concern   Not on file  Social History Narrative   Not on file   Social Determinants of Health   Financial Resource Strain: Not on file  Food Insecurity: Not on file  Transportation Needs: Not on file  Physical Activity: Not on file  Stress: Not on file  Social Connections: Not on file  Intimate Partner Violence: Not on file     PHYSICAL EXAM  GENERAL  EXAM/CONSTITUTIONAL: Vitals:  Vitals:   12/29/20 1433  BP: 131/77  Pulse: 74  Weight: 151 lb (68.5 kg)  Height: 5\' 7"  (1.702 m)   Body mass index is 23.65 kg/m. Wt Readings from Last 3 Encounters:  12/29/20 151 lb (68.5 kg)  12/06/19 164 lb 14.5 oz (74.8 kg)  09/30/17 165 lb (74.8 kg)   Patient is in no distress; well developed, nourished and groomed; neck is supple  CARDIOVASCULAR: Examination of carotid arteries is normal; no carotid bruits Regular rate and rhythm, no murmurs Examination of peripheral vascular system by observation and palpation is normal  EYES: Ophthalmoscopic exam of optic discs and posterior segments is normal; no papilledema or hemorrhages No results found.  MUSCULOSKELETAL: Gait, strength, tone, movements noted in Neurologic exam below  NEUROLOGIC: MENTAL STATUS:  MMSE - Mini Mental State Exam 12/29/2020  Orientation to time 5  Orientation to Place 4  Registration 3  Attention/ Calculation 0  Recall 2  Language- name 2 objects 2  Language- repeat 0  Language- follow 3 step command 3  Language- read & follow direction 1  Write a sentence 1  Copy design 0  Total score 21   awake, alert, oriented to person, place and time recent and remote memory intact normal attention and concentration language fluent, comprehension intact, naming intact fund of knowledge appropriate  CRANIAL NERVE:  2nd - no papilledema on fundoscopic exam 2nd, 3rd, 4th, 6th - pupils equal and reactive to light, visual fields full to confrontation, extraocular muscles intact, no nystagmus 5th - facial sensation symmetric 7th - facial strength symmetric 8th - hearing intact 9th - palate elevates symmetrically, uvula midline 11th - shoulder shrug symmetric 12th - tongue  protrusion midline  MOTOR:  normal bulk and tone, full strength in the BUE, BLE  SENSORY:  normal and symmetric to light touch, temperature, vibration  COORDINATION:  finger-nose-finger, fine  finger movements normal  REFLEXES:  deep tendon reflexes present and symmetric  GAIT/STATION:  narrow based gait    DIAGNOSTIC DATA (LABS, IMAGING, TESTING) - I reviewed patient records, labs, notes, testing and imaging myself where available.  Lab Results  Component Value Date   WBC 8.5 12/06/2019   HGB 12.0 12/06/2019   HCT 36.8 12/06/2019   MCV 83.4 12/06/2019   PLT 275 12/06/2019      Component Value Date/Time   NA 139 12/06/2019 1927   K 3.2 (L) 12/06/2019 1927   CL 106 12/06/2019 1927   CO2 21 (L) 12/06/2019 1927   GLUCOSE 92 12/06/2019 1927   BUN 15 12/06/2019 1927   CREATININE 0.80 12/06/2019 1927   CREATININE 0.71 04/23/2013 1443   CALCIUM 9.0 12/06/2019 1927   PROT 6.7 12/06/2019 1927   ALBUMIN 3.9 12/06/2019 1927   AST 24 12/06/2019 1927   ALT 21 12/06/2019 1927   ALKPHOS 46 12/06/2019 1927   BILITOT 1.2 12/06/2019 1927   GFRNONAA >60 12/06/2019 1927   GFRAA >60 12/06/2019 1927   No results found for: CHOL, HDL, LDLCALC, LDLDIRECT, TRIG, CHOLHDL Lab Results  Component Value Date   HGBA1C 5.7 (H) 11/07/2013   No results found for: VITAMINB12 No results found for: TSH   09/18/20 MRI brain [I reviewed images myself and agree with interpretation. -VRP]  1. No acute intracranial abnormality. 2. Findings of chronic small vessel ischemia.    ASSESSMENT AND PLAN  70 y.o. year old female here with:   Dx:  1. MCI (mild cognitive impairment)   2. Insomnia, unspecified type     PLAN:  MILD MEMORY LOSS (MMSE 21/30; mild changes in ADLs with driving and losing items; h/o concussion in Aug 2021; possible MCI vs mild dementia) - safety / supervision issues reviewed - daily physical activity / exercise (at least 15-30 minutes) - eat more plants / vegetables - increase social activities, brain stimulation, games, puzzles, hobbies, crafts, arts, music - aim for at least 7-8 hours sleep per night (or more) - avoid smoking and alcohol - caregiver  resources provided - caution with medications, finances, driving  ANXIETY / INSOMNIA - recommend psychiatry evaluation  GAIT DIFFICULTY - check neuropathy labs - consider PT evaluation  Orders Placed This Encounter  Procedures   Vitamin B12   Hemoglobin A1c   Return for return to PCP, pending if symptoms worsen or fail to improve.    Penni Bombard, MD 06/17/6008, 9:32 PM Certified in Neurology, Neurophysiology and Neuroimaging  [facility] [address] [place], [place] [address] [phone]

## 2020-12-29 NOTE — Patient Instructions (Signed)
MILD MEMORY LOSS (MMSE 21/30; mild changes in ADLs with driving and losing items; h/o concussion in Aug 2021; possible MCI vs mild dementia) - safety / supervision issues reviewed - daily physical activity / exercise (at least 15-30 minutes) - eat more plants / vegetables - increase social activities, brain stimulation, games, puzzles, hobbies, crafts, arts, music - aim for at least 7-8 hours sleep per night (or more) - avoid smoking and alcohol - caregiver resources provided - caution with medications, finances, driving  ANXIETY / INSOMNIA - recommend psychiatry evaluation  GAIT DIFFICULTY - check neuropathy labs - consider PT evaluation

## 2020-12-30 LAB — HEMOGLOBIN A1C
Est. average glucose Bld gHb Est-mCnc: 114 mg/dL
Hgb A1c MFr Bld: 5.6 % (ref 4.8–5.6)

## 2020-12-30 LAB — VITAMIN B12: Vitamin B-12: 376 pg/mL (ref 232–1245)

## 2020-12-31 ENCOUNTER — Encounter: Payer: Self-pay | Admitting: Diagnostic Neuroimaging

## 2021-01-07 ENCOUNTER — Telehealth: Payer: Self-pay

## 2021-01-07 NOTE — Telephone Encounter (Signed)
Component Ref Range & Units 9 d ago   Vitamin B-12 232 - 1,245 pg/mL 376    Component Ref Range & Units 9 d ago  Hgb A1c MFr Bld 4.8 - 5.6 % 5.6   I called pt and advised of lab results via vm. Pt advised copy would be sent to PCP for review as well.

## 2021-03-11 ENCOUNTER — Other Ambulatory Visit: Payer: Self-pay | Admitting: Registered Nurse

## 2021-03-11 DIAGNOSIS — F17201 Nicotine dependence, unspecified, in remission: Secondary | ICD-10-CM

## 2021-04-16 ENCOUNTER — Ambulatory Visit
Admission: RE | Admit: 2021-04-16 | Discharge: 2021-04-16 | Disposition: A | Discharge status: Home / Self Care | Payer: Medicare Other | Source: Ambulatory Visit | Attending: Registered Nurse | Admitting: Registered Nurse

## 2021-04-16 ENCOUNTER — Other Ambulatory Visit: Payer: Self-pay

## 2021-04-16 DIAGNOSIS — F17201 Nicotine dependence, unspecified, in remission: Secondary | ICD-10-CM

## 2021-05-08 ENCOUNTER — Ambulatory Visit: Payer: Medicare Other | Admitting: Physician Assistant

## 2021-05-20 ENCOUNTER — Other Ambulatory Visit: Payer: Self-pay | Admitting: Internal Medicine

## 2021-05-20 DIAGNOSIS — R413 Other amnesia: Secondary | ICD-10-CM

## 2021-06-05 ENCOUNTER — Ambulatory Visit
Admission: RE | Admit: 2021-06-05 | Discharge: 2021-06-05 | Disposition: A | Discharge status: Home / Self Care | Payer: Medicare Other | Source: Ambulatory Visit | Attending: Internal Medicine | Admitting: Internal Medicine

## 2021-06-05 ENCOUNTER — Other Ambulatory Visit: Payer: Self-pay

## 2021-06-05 DIAGNOSIS — R413 Other amnesia: Secondary | ICD-10-CM

## 2021-10-01 ENCOUNTER — Other Ambulatory Visit: Payer: Self-pay | Admitting: Internal Medicine

## 2021-10-01 DIAGNOSIS — R911 Solitary pulmonary nodule: Secondary | ICD-10-CM

## 2021-11-02 ENCOUNTER — Ambulatory Visit (HOSPITAL_COMMUNITY): Payer: Medicare Other | Admitting: Licensed Clinical Social Worker

## 2021-11-02 DIAGNOSIS — F419 Anxiety disorder, unspecified: Secondary | ICD-10-CM

## 2021-11-02 NOTE — Progress Notes (Addendum)
Cln unable to see pt for [facility] due to cln being unable to bill [facility] Medicare. Cln provided pt with contact information for [facility] and [facility]. Cln also contacted [facility] to inquire about appt availability, to which cln was informed therapists have a three month wait list and MD/NP/DO/PAs have availability within a week. Pt informed of same.

## 2021-11-20 ENCOUNTER — Ambulatory Visit
Admission: RE | Admit: 2021-11-20 | Discharge: 2021-11-20 | Disposition: A | Discharge status: Home / Self Care | Payer: Medicare Other | Source: Ambulatory Visit | Attending: Internal Medicine | Admitting: Internal Medicine

## 2021-11-20 DIAGNOSIS — R911 Solitary pulmonary nodule: Secondary | ICD-10-CM

## 2021-11-26 ENCOUNTER — Other Ambulatory Visit (HOSPITAL_COMMUNITY): Payer: Self-pay | Admitting: Internal Medicine

## 2021-11-26 ENCOUNTER — Other Ambulatory Visit: Payer: Self-pay | Admitting: Internal Medicine

## 2021-11-26 DIAGNOSIS — R911 Solitary pulmonary nodule: Secondary | ICD-10-CM

## 2021-12-04 ENCOUNTER — Other Ambulatory Visit: Payer: Self-pay | Admitting: Internal Medicine

## 2021-12-04 DIAGNOSIS — Z1231 Encounter for screening mammogram for malignant neoplasm of breast: Secondary | ICD-10-CM

## 2021-12-07 ENCOUNTER — Encounter (HOSPITAL_COMMUNITY)
Admission: RE | Admit: 2021-12-07 | Discharge: 2021-12-07 | Disposition: A | Discharge status: Home / Self Care | Payer: Medicare Other | Source: Ambulatory Visit | Attending: Internal Medicine | Admitting: Internal Medicine

## 2021-12-07 DIAGNOSIS — R911 Solitary pulmonary nodule: Secondary | ICD-10-CM | POA: Insufficient documentation

## 2021-12-07 LAB — GLUCOSE, CAPILLARY: Glucose-Capillary: 89 mg/dL (ref 70–99)

## 2021-12-07 MED ORDER — FLUDEOXYGLUCOSE F - 18 (FDG) INJECTION
7.5000 | Freq: Once | INTRAVENOUS | Status: AC
  Administered 2021-12-07: 7.8 via INTRAVENOUS

## 2021-12-22 ENCOUNTER — Ambulatory Visit (INDEPENDENT_AMBULATORY_CARE_PROVIDER_SITE_OTHER): Payer: Medicare Other | Admitting: Emergency Medicine

## 2021-12-22 ENCOUNTER — Encounter: Payer: Self-pay | Admitting: Emergency Medicine

## 2021-12-22 DIAGNOSIS — J449 Chronic obstructive pulmonary disease, unspecified: Secondary | ICD-10-CM | POA: Diagnosis not present

## 2021-12-22 DIAGNOSIS — R911 Solitary pulmonary nodule: Secondary | ICD-10-CM | POA: Diagnosis not present

## 2021-12-22 MED ORDER — STIOLTO RESPIMAT 2.5-2.5 MCG/ACT IN AERS: 2.0000 | INHALATION_SPRAY | Freq: Every day | RESPIRATORY_TRACT | 0 refills | Status: DC

## 2021-12-22 NOTE — Patient Instructions (Signed)
We will perform pulmonary function testing. Depending on your pulmonary function testing we will determine next steps in evaluation of your small right upper lobe pulmonary nodule.  This might include referral for surgical evaluation or possibly bronchoscopy and biopsies. Please try starting Stiolto 2 puffs once daily.  Keep track of with the benefit of this medication.  If so then please let us know so we can send a prescription to your pharmacy. Keep your albuterol available to use 2 puffs when needed for shortness of breath, chest tightness, wheezing. Follow Dr. Lamonte Sakai next available after your PFT so we can review the results together

## 2021-12-22 NOTE — Progress Notes (Signed)
Subjective:    Patient ID: Andrea Morgan, female    DOB: 11-10-1950, 71 y.o.   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  I performed bronchoscopy in 2013 to sample that lesion but right lower lobe and right middle lobe biopsies were negative prior to surgical resection.  She is referred back now for an abnormal CT scan of the chest as below. She reports increased dyspnea, a lot of cough that is productive of clear mucous. Occasional wheeze. 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 She has albuterol, uses about 3x a day.   CT chest 11/20/2021 reviewed by me shows 7.7 mm spiculated lateral right upper lobe nodule enlarged slightly compared with previous scan.  Other nodules are unchanged in size or appearance.  PET scan 12/07/2021 reviewed by me shows low-level hypermetabolic activity in the spiculated peripheral right upper lobe pulmonary nodule 7 mm.  There are some other scattered smaller nodules up to 5 mm including in the right upper lobe all below the threshold of PET detection.  No thoracic adenopathy or distant disease noted  Lung cancer screening CT 04/16/2021 reviewed by me showed advanced intralobular and paraseptal emphysema, some scattered lung nodules including in the right upper lobe 6.6 mm   Review of Systems As per HPI  Past Medical History:  Diagnosis Date   Adrenal adenoma    Anal condyloma    Anemia    Anxiety    Arthritis    arthritis left jaw / pain     Cancer ([place]) 2013   lung cancer   DDD (degenerative disc disease), thoracic    Depression    Dyspnea on exertion    Emphysema/COPD (HCC)    GERD (gastroesophageal reflux disease)    watches diet   Hiatal hernia    History of adenomatous polyp of colon    06/ 2016 hyperplastic   History of cancer of lower lobe bronchus or lung followed  by dr gerhardt-- lov 01/ 2017   06-02-2011  s/p  Right VATS w/ left mini thoracotomy resection 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)   History of condyloma acuminatum    removal anal canal condyloma 11-08-2013   History of kidney stones    History of panic attacks    Hyperlipidemia    Hypothyroidism    Insomnia    Liver cyst    per CT in 2012   PAF (paroxysmal atrial fibrillation) [facility]) cardiologist-  dr Mare Ferrari   single episode Atrial Fibrillation w/ RVR post-op day 3 the right colectomy surgery on 11-08-2013       PONV (postoperative nausea and vomiting)    Pulmonary nodule, right    right upper lobe per last Chest CT 05-14-2015     Family History  Problem Relation Age of Onset   Asthma Mother    Heart disease Mother    Lung cancer Father    Kidney cancer Sister    Cancer Sister        kidney   Emphysema Sister    Colon cancer Brother        late 50's   Liver cancer Brother    Asthma Maternal Grandmother    Ovarian cancer Maternal Aunt    Breast cancer Maternal Aunt        x2   Prostate cancer  Maternal Uncle    Anesthesia problems Neg Hx    Esophageal cancer Neg Hx    Stomach cancer Neg Hx      Social History   Socioeconomic History   Marital status: Divorced    Spouse name: Not on file   Number of children: 1   Years of education: 12   Highest education level: Not on file  Occupational History   Occupation: caregiver    Employer: [employer]  Tobacco Use   Smoking status: Former    Packs/day: 1.00    Years: 45.00    Total pack years: 45.00    Types: Cigarettes    Quit date: 04/13/2012    Years since quitting: 9.6   Smokeless tobacco: Never  Vaping Use   Vaping Use: Never used  Substance and Sexual Activity   Alcohol use: No   Drug use: Yes    Types: Marijuana    Comment: 12/29/20 daily use per pt   Sexual activity: Not on file  Other Topics Concern   Not on file  Social History Narrative   Not on file    Social Determinants of Health   Financial Resource Strain: Not on file  Food Insecurity: Not on file  Transportation Needs: Not on file  Physical Activity: Not on file  Stress: Not on file  Social Connections: Not on file  Intimate Partner Violence: Not on file     Allergies  Allergen Reactions   Oxycodone Itching   Sulfa Antibiotics Hives   Morphine And Related Itching     Outpatient Medications Prior to Visit  Medication Sig Dispense Refill   albuterol (VENTOLIN HFA) 108 (90 Base) MCG/ACT inhaler      alendronate (FOSAMAX) 70 MG tablet 1 tablet 30 minutes before the first food, beverage or medicine of the day with plain water     aspirin EC 81 MG tablet Take 81 mg by mouth daily.     atorvastatin (LIPITOR) 20 MG tablet Take 20 mg by mouth at bedtime.      buPROPion (WELLBUTRIN SR) 150 MG 12 hr tablet Take 1 tablet by mouth daily.     carvedilol (COREG) 3.125 MG tablet Take 3.125 mg by mouth every morning.     Cholecalciferol (VITAMIN D) 2000 units tablet Take 2,000 Units by mouth daily.     diltiazem (CARDIZEM CD) 120 MG 24 hr capsule TAKE 1 CAPSULE (120 MG TOTAL) BY MOUTH DAILY. 30 capsule 7   escitalopram (LEXAPRO) 20 MG tablet Take 20 mg by mouth every evening.   3   levothyroxine (SYNTHROID) 88 MCG tablet Take 88 mcg by mouth every morning.     levothyroxine (SYNTHROID, LEVOTHROID) 50 MCG tablet Take 50 mcg by mouth daily before breakfast.  3   LORazepam (ATIVAN) 2 MG tablet 1 tablet     naproxen sodium (ANAPROX) 220 MG tablet Take 440 mg by mouth every 12 (twelve) hours as needed (pain).     ondansetron (ZOFRAN) 4 MG tablet      pantoprazole (PROTONIX) 40 MG tablet Take 1 tablet by mouth daily.     vitamin B-12 (CYANOCOBALAMIN) 1000 MCG tablet Take 5,000 mcg by mouth daily.     zolpidem (AMBIEN) 10 MG tablet Take 10 mg by mouth at bedtime.      Facility-Administered Medications Prior to Visit  Medication Dose Route Frequency Provider Last Rate Last Admin   0.9 %   sodium chloride infusion  500 mL Intravenous Once Pyrtle, Lajuan Lines, MD  Objective:   Physical Exam Vitals:   12/22/21 1328  BP: 130/74  Pulse: 96  Temp: 98 F (36.7 C)  TempSrc: Oral  SpO2: 98%  Weight: 146 lb 12.8 oz (66.6 kg)  Height: 5\' 7"  (1.702 m)    Gen: Pleasant, well-nourished, in no distress,  normal affect, intermittent coughing especially with a deep breath  ENT: No lesions,  mouth clear,  oropharynx clear, no postnasal drip  Neck: No JVD, no stridor  Lungs: No use of accessory muscles, no crackles or wheezing on normal respiration, no wheeze on forced expiration  Cardiovascular: RRR, heart sounds normal, no murmur or gallops, no peripheral edema  Musculoskeletal: No deformities, no cyanosis or clubbing  Neuro: alert, awake, non focal  Skin: Warm, no lesions or rash      Assessment & Plan:  Pulmonary nodule Multiple small right upper lobe pulmonary nodules, index nodule is 7.7 mm and is faintly hypermetabolic on PET scan.  Consistent in appearance with primary lung malignancy.  Talked to her today about the appropriate work-up which might include surgical resection versus navigational bronchoscopy to achieve a tissue diagnosis before planning therapeutics.  We will check pulmonary function testing to assess her degree of obstruction.  If she is a suitable candidate then I will make the thoracic surgery referral.  COPD (chronic obstructive pulmonary disease) Plan for PFTs to quantify her degree of obstruction.  She is symptomatic.  We will try starting Stiolto to see if she gets clinical benefit.  If she improves then we will continue going forward.   Baltazar Apo, MD, PhD 12/22/2021, 1:56 PM [facility] [phone] or if no answer before 7:00PM call [phone] For any issues after 7:00PM please call eLink [phone]

## 2021-12-22 NOTE — Addendum Note (Signed)
Addended by: Gavin Potters R on: 12/22/2021 02:08 PM   Modules accepted: Orders

## 2021-12-22 NOTE — Assessment & Plan Note (Signed)
Multiple small right upper lobe pulmonary nodules, index nodule is 7.7 mm and is faintly hypermetabolic on PET scan.  Consistent in appearance with primary lung malignancy.  Talked to her today about the appropriate work-up which might include surgical resection versus navigational bronchoscopy to achieve a tissue diagnosis before planning therapeutics.  We will check pulmonary function testing to assess her degree of obstruction.  If she is a suitable candidate then I will make the thoracic surgery referral.

## 2021-12-22 NOTE — Assessment & Plan Note (Signed)
Plan for PFTs to quantify her degree of obstruction.  She is symptomatic.  We will try starting Stiolto to see if she gets clinical benefit.  If she improves then we will continue going forward.

## 2022-02-03 ENCOUNTER — Encounter: Payer: Self-pay | Admitting: Emergency Medicine

## 2022-02-03 ENCOUNTER — Ambulatory Visit (INDEPENDENT_AMBULATORY_CARE_PROVIDER_SITE_OTHER): Payer: Medicare Other | Admitting: Emergency Medicine

## 2022-02-03 VITALS — BP 124/60 | HR 72 | Ht 66.0 in | Wt 152.6 lb

## 2022-02-03 DIAGNOSIS — R911 Solitary pulmonary nodule: Secondary | ICD-10-CM

## 2022-02-03 LAB — PULMONARY FUNCTION TEST
DL/VA % pred: 70 %
DL/VA: 2.85 ml/min/mmHg/L
DLCO cor % pred: 68 %
DLCO cor: 14.51 ml/min/mmHg
DLCO unc % pred: 68 %
DLCO unc: 14.51 ml/min/mmHg
FEF 25-75 Post: 2.17 L/sec
FEF 25-75 Pre: 1.13 L/sec
FEF2575-%Change-Post: 91 %
FEF2575-%Pred-Post: 112 %
FEF2575-%Pred-Pre: 58 %
FEV1-%Change-Post: 20 %
FEV1-%Pred-Post: 88 %
FEV1-%Pred-Pre: 72 %
FEV1-Post: 2.17 L
FEV1-Pre: 1.8 L
FEV1FVC-%Change-Post: 3 %
FEV1FVC-%Pred-Pre: 90 %
FEV6-%Change-Post: 16 %
FEV6-%Pred-Post: 98 %
FEV6-%Pred-Pre: 84 %
FEV6-Post: 3.09 L
FEV6-Pre: 2.64 L
FEV6FVC-%Pred-Post: 104 %
FEV6FVC-%Pred-Pre: 104 %
FVC-%Change-Post: 16 %
FVC-%Pred-Post: 94 %
FVC-%Pred-Pre: 80 %
FVC-Post: 3.09 L
FVC-Pre: 2.64 L
Post FEV1/FVC ratio: 70 %
Post FEV6/FVC ratio: 100 %
Pre FEV1/FVC ratio: 68 %
Pre FEV6/FVC Ratio: 100 %
RV % pred: 96 %
RV: 2.31 L
TLC % pred: 93 %
TLC: 5.16 L

## 2022-02-03 MED ORDER — STIOLTO RESPIMAT 2.5-2.5 MCG/ACT IN AERS: 2.0000 | INHALATION_SPRAY | Freq: Every day | RESPIRATORY_TRACT | 3 refills | Status: DC

## 2022-02-03 NOTE — Patient Instructions (Signed)
Full PFT performed today. °

## 2022-02-03 NOTE — Patient Instructions (Signed)
We reviewed your pulmonary function testing today We will continue Stiolto 2 puffs once daily. Keep your albuterol available to use 2 puffs to be needed for shortness of breath, chest tightness, wheezing. We will refer you to see cardiothoracic surgery to discuss the possible surgical resection of a small right upper lobe pulmonary nodule Follow Dr. Lamonte Sakai in 1 month or next available

## 2022-02-03 NOTE — Assessment & Plan Note (Signed)
Moderate obstruction with a positive bronchodilator response on her PFT.  She did get a great response from the [facility] and we will plan to continue it.  Prescription sent today.

## 2022-02-03 NOTE — Assessment & Plan Note (Signed)
Right upper lobe pulmonary nodule has increased in size, is hypermetabolic on PET, very suspicious for primary malignancy.  Given her reassuring PFT she should be a reasonable candidate for resection although she had a prior VATS for a pleural-based spindle cell carcinoma.  This could complicate surgical approach, make the procedure more difficult.  Would like for her to review with thoracic surgery to discuss the options.  She would like to have it removed if feasible.  If thoracic surgery is not recommended then we can perform navigational bronchoscopy to get a tissue diagnosis and facilitate SBRT.

## 2022-02-03 NOTE — Progress Notes (Signed)
Full PFT performed today. °

## 2022-02-03 NOTE — Progress Notes (Signed)
Subjective:    Patient ID: Andrea Morgan, female    DOB: 01-22-1951, 71 y.o.   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  I performed bronchoscopy in 2013 to sample that lesion but right lower lobe and right middle lobe biopsies were negative prior to surgical resection.  She is referred back now for an abnormal CT scan of the chest as below. She reports increased dyspnea, a lot of cough that is productive of clear mucous. Occasional wheeze. 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 She has albuterol, uses about 3x a day.   CT chest 11/20/2021 reviewed by me shows 7.7 mm spiculated lateral right upper lobe nodule enlarged slightly compared with previous scan.  Other nodules are unchanged in size or appearance.  PET scan 12/07/2021 reviewed by me shows low-level hypermetabolic activity in the spiculated peripheral right upper lobe pulmonary nodule 7 mm.  There are some other scattered smaller nodules up to 5 mm including in the right upper lobe all below the threshold of PET detection.  No thoracic adenopathy or distant disease noted  Lung cancer screening CT 04/16/2021 reviewed by me showed advanced intralobular and paraseptal emphysema, some scattered lung nodules including in the right upper lobe 6.6 mm   ROV 02/03/22 --follow-up visit 71 year old woman with history of COPD, prior chest spindle cell carcinoma post right VATS with minithoracotomy, resection 2013.  She has a slowly enlarging 7.7 mm lateral right upper lobe pulmonary nodule that is slightly positive on PET scan done on 12/07/2021.  There are other smaller nodules that are below the threshold of PET detection.  She underwent pulmonary function testing today as below. She started stiolto last time and feels that she is benefiting,. Rare albuterol use. Her  functional capacity is much improved as is her cough.   Pulmonary function testing performed today and reviewed by me shows moderate obstruction with a positive bronchodilator response, normal lung volumes and a decreased diffusion capacity 68% predicted.   Review of Systems As per HPI  Past Medical History:  Diagnosis Date   Adrenal adenoma    Anal condyloma    Anemia    Anxiety    Arthritis    arthritis left jaw / pain     Cancer ([place]) 2013   lung cancer   DDD (degenerative disc disease), thoracic    Depression    Dyspnea on exertion    Emphysema/COPD ([facility])    GERD (gastroesophageal reflux disease)    watches diet   Hiatal hernia    History of adenomatous polyp of colon    06/ 2016 hyperplastic   History of cancer of lower lobe bronchus or lung followed by dr gerhardt-- lov 01/ 2017   06-02-2011  s/p  Right VATS w/ left mini thoracotomy resection 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)   History of condyloma acuminatum    removal anal canal condyloma 11-08-2013   History of kidney stones    History of panic attacks    Hyperlipidemia    Hypothyroidism    Insomnia    Liver cyst    per CT in 2012   PAF (paroxysmal atrial fibrillation) [facility]) cardiologist-  dr Mare Ferrari   single episode Atrial Fibrillation w/ RVR post-op day 3 the right colectomy surgery on 11-08-2013       PONV (  postoperative nausea and vomiting)    Pulmonary nodule, right    right upper lobe per last Chest CT 05-14-2015     Family History  Problem Relation Age of Onset   Asthma Mother    Heart disease Mother    Lung cancer Father    Kidney cancer Sister    Cancer Sister        kidney   Emphysema Sister    Colon cancer Brother        late 67's   Liver cancer Brother    Asthma Maternal Grandmother    Ovarian cancer Maternal Aunt    Breast cancer Maternal Aunt        x2   Prostate cancer Maternal Uncle    Anesthesia problems Neg Hx    Esophageal cancer Neg Hx     Stomach cancer Neg Hx      Social History   Socioeconomic History   Marital status: Divorced    Spouse name: Not on file   Number of children: 1   Years of education: 12   Highest education level: Not on file  Occupational History   Occupation: caregiver    Employer: [employer]  Tobacco Use   Smoking status: Former    Packs/day: 1.00    Years: 45.00    Total pack years: 45.00    Types: Cigarettes    Quit date: 04/13/2012    Years since quitting: 9.8   Smokeless tobacco: Never  Vaping Use   Vaping Use: Never used  Substance and Sexual Activity   Alcohol use: No   Drug use: Yes    Types: Marijuana    Comment: 12/29/20 daily use per pt   Sexual activity: Not on file  Other Topics Concern   Not on file  Social History Narrative   Not on file   Social Determinants of Health   Financial Resource Strain: Not on file  Food Insecurity: Not on file  Transportation Needs: Not on file  Physical Activity: Not on file  Stress: Not on file  Social Connections: Not on file  Intimate Partner Violence: Not on file     Allergies  Allergen Reactions   Oxycodone Itching   Sulfa Antibiotics Hives   Morphine And Related Itching     Outpatient Medications Prior to Visit  Medication Sig Dispense Refill   albuterol (VENTOLIN HFA) 108 (90 Base) MCG/ACT inhaler      alendronate (FOSAMAX) 70 MG tablet 1 tablet 30 minutes before the first food, beverage or medicine of the day with plain water     aspirin EC 81 MG tablet Take 81 mg by mouth daily.     atorvastatin (LIPITOR) 20 MG tablet Take 20 mg by mouth at bedtime.      buPROPion (WELLBUTRIN SR) 150 MG 12 hr tablet Take 1 tablet by mouth daily.     carvedilol (COREG) 3.125 MG tablet Take 3.125 mg by mouth every morning.     Cholecalciferol (VITAMIN D) 2000 units tablet Take 2,000 Units by mouth daily.     diltiazem (CARDIZEM CD) 120 MG 24 hr capsule TAKE 1 CAPSULE (120 MG TOTAL) BY MOUTH DAILY. 30 capsule 7    escitalopram (LEXAPRO) 20 MG tablet Take 20 mg by mouth every evening.   3   levothyroxine (SYNTHROID) 88 MCG tablet Take 88 mcg by mouth every morning.     naproxen sodium (ANAPROX) 220 MG tablet Take 440 mg by mouth every 12 (twelve) hours as needed (  pain).     ondansetron (ZOFRAN) 4 MG tablet      pantoprazole (PROTONIX) 40 MG tablet Take 1 tablet by mouth daily.     Tiotropium Bromide-Olodaterol (STIOLTO RESPIMAT) 2.5-2.5 MCG/ACT AERS Inhale 2 puffs into the lungs daily. 4 g 0   vitamin B-12 (CYANOCOBALAMIN) 1000 MCG tablet Take 5,000 mcg by mouth daily.     zolpidem (AMBIEN) 10 MG tablet Take 10 mg by mouth at bedtime.      levothyroxine (SYNTHROID, LEVOTHROID) 50 MCG tablet Take 50 mcg by mouth daily before breakfast.  3   LORazepam (ATIVAN) 2 MG tablet 1 tablet     Facility-Administered Medications Prior to Visit  Medication Dose Route Frequency Provider Last Rate Last Admin   0.9 %  sodium chloride infusion  500 mL Intravenous Once Pyrtle, Lajuan Lines, MD            Objective:   Physical Exam Vitals:   02/03/22 1401  BP: 124/60  Pulse: 72  SpO2: 96%  Weight: 152 lb 9.6 oz (69.2 kg)  Height: 5\' 6"  (1.676 m)    Gen: Pleasant, well-nourished, in no distress,  normal affect, no cough today  ENT: No lesions,  mouth clear,  oropharynx clear, no postnasal drip  Neck: No JVD, no stridor  Lungs: No use of accessory muscles, no crackles or wheezing on normal respiration, no wheeze on forced expiration  Cardiovascular: RRR, heart sounds normal, no murmur or gallops, no peripheral edema  Musculoskeletal: No deformities, no cyanosis or clubbing  Neuro: alert, awake, non focal  Skin: Warm, no lesions or rash      Assessment & Plan:  COPD (chronic obstructive pulmonary disease) Moderate obstruction with a positive bronchodilator response on her PFT.  She did get a great response from the [facility] and we will plan to continue it.  Prescription sent today.  Pulmonary nodule Right  upper lobe pulmonary nodule has increased in size, is hypermetabolic on PET, very suspicious for primary malignancy.  Given her reassuring PFT she should be a reasonable candidate for resection although she had a prior VATS for a pleural-based spindle cell carcinoma.  This could complicate surgical approach, make the procedure more difficult.  Would like for her to review with thoracic surgery to discuss the options.  She would like to have it removed if feasible.  If thoracic surgery is not recommended then we can perform navigational bronchoscopy to get a tissue diagnosis and facilitate SBRT.   Baltazar Apo, MD, PhD 02/03/2022, 2:14 PM [facility] [phone] or if no answer before 7:00PM call [phone] For any issues after 7:00PM please call eLink [phone]

## 2022-02-09 ENCOUNTER — Encounter: Payer: Self-pay | Admitting: *Deleted

## 2022-02-09 ENCOUNTER — Institutional Professional Consult (permissible substitution) (INDEPENDENT_AMBULATORY_CARE_PROVIDER_SITE_OTHER): Payer: Medicare Other | Admitting: Thoracic Surgery (Cardiothoracic Vascular Surgery)

## 2022-02-09 ENCOUNTER — Other Ambulatory Visit: Payer: Self-pay | Admitting: *Deleted

## 2022-02-09 ENCOUNTER — Encounter: Payer: Self-pay | Admitting: Thoracic Surgery (Cardiothoracic Vascular Surgery)

## 2022-02-09 VITALS — BP 149/73 | HR 71 | Resp 20 | Ht 66.0 in | Wt 151.0 lb

## 2022-02-09 DIAGNOSIS — R911 Solitary pulmonary nodule: Secondary | ICD-10-CM | POA: Diagnosis not present

## 2022-02-09 NOTE — H&P (View-Only) (Signed)
PCP is Andrea Hatchet, MD Referring Provider is Andrea Gobble, MD  Chief Complaint  Patient presents with   Lung Lesion    Surgical consult    HPI: Ms. Andrea Morgan is sent for consultation regarding a right upper lobe lung nodule.  Andrea Morgan is a 71 year old woman with a history of remote tobacco abuse, COPD, solitary fibrous tumor of the pleura, hyper lipidemia, hypothyroidism, anemia, degenerative disc disease, hiatal hernia with reflux, anxiety, and depression.  She smoked about a pack a day for 45 years prior to quitting in 2013.  In 2013 she had a right thoracotomy for resection of a solitary fibrous tumor of the pleura by Dr. Servando Morgan.  She did well with that procedure without any major complications.  She had a low-dose CT for lung cancer screening in December 2022.  It showed multiple pulmonary nodules in the setting of emphysema.  The most suspicious nodules in the periphery of the right upper lobe.  A follow-up scan in August showed that nodule had increased in size slightly.  A PET/CT was done which showed the nodule was mildly hypermetabolic.  There was no evidence of regional or distant metastatic disease.  She gets short of breath with exertion but can walk up a flight of stairs without stopping.  She has a chronic cough but that is better with the new inhaler that Dr. Lamonte Morgan prescribed.  She does have a hiatal hernia and reflux with frequent symptoms.  No exertional chest pain, pressure, or tightness.  Zubrod Score: At the time of surgery this patient's most appropriate activity status/level should be described as: []     0    Normal activity, no symptoms [x]     1    Restricted in physical strenuous activity but ambulatory, able to do out light work []     2    Ambulatory and capable of self care, unable to do work activities, up and about >50 % of waking hours                              []     3    Only limited self care, in bed greater than 50% of waking hours []     4     Completely disabled, no self care, confined to bed or chair []     5    Moribund  Past Medical History:  Diagnosis Date   Adrenal adenoma    Anal condyloma    Anemia    Anxiety    Arthritis    arthritis left jaw / pain     Cancer ([place]) 2013   lung cancer   DDD (degenerative disc disease), thoracic    Depression    Dyspnea on exertion    Emphysema/COPD ([facility])    GERD (gastroesophageal reflux disease)    watches diet   Hiatal hernia    History of adenomatous polyp of colon    06/ 2016 hyperplastic   History of cancer of lower lobe bronchus or lung followed by dr gerhardt-- lov 01/ 2017   06-02-2011  s/p  Right VATS w/ left mini thoracotomy resection 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)   History of condyloma acuminatum    removal anal canal condyloma 11-08-2013   History of kidney stones    History of panic attacks    Hyperlipidemia    Hypothyroidism    Insomnia    Liver cyst  per CT in 2012   PAF (paroxysmal atrial fibrillation) [facility]) cardiologist-  dr Mare Ferrari   single episode Atrial Fibrillation w/ RVR post-op day 3 the right colectomy surgery on 11-08-2013       PONV (postoperative nausea and vomiting)    Pulmonary nodule, right    right upper lobe per last Chest CT 05-14-2015    Past Surgical History:  Procedure Laterality Date   [place]   C5 -- C7   COLONOSCOPY  last one 10-17-2014   ESOPHAGOGASTRODUODENOSCOPY  05/21/2011   Procedure: ESOPHAGOGASTRODUODENOSCOPY (EGD);  Surgeon: Beryle Beams, MD;  Location: [facility];  Service: Endoscopy;  Laterality: N/A;   EXAMINATION UNDER ANESTHESIA N/A 11/08/2013   Procedure: ANAL EXAM UNDER ANESTHESIA WITH BIOPSY;  Surgeon: [name], MD;  Location: [facility];  Service: General;  Laterality: N/A;   LAPAROSCOPIC PARTIAL COLECTOMY N/A 11/08/2013   Procedure: laparoscopic partial right colectomy;  Surgeon: [name], MD;  Location: [facility];  Service:  General;  Laterality: N/A;   MASS EXCISION  06/02/2011   Procedure: EXCISION MASS;  Surgeon: Grace Isaac, MD;  Location: [facility];  Service: Thoracic;;  minithoracotomy resection spindle cell tumor right chest   MASS EXCISION N/A 05/06/2016   Procedure: EXCISION ANAL CONDYLOMA;  Surgeon: [name], MD;  Location: [facility];  Service: General;  Laterality: N/A;   THORACIC DISCECTOMY Right 01/13/2016   Procedure: RIGHT THORACIC ONE THORACIC TWO THORACIC LAMINOTOMY AND MICRODISCECTOMY;  Surgeon: Jovita Gamma, MD;  Location: [facility];  Service: Neurosurgery;  Laterality: Right;   TONSILLECTOMY  1971   TRANSTHORACIC ECHOCARDIOGRAM  08/28/2014   ef 55-60%/  trivial MR and TR   VIDEO BRONCHOSCOPY  05/11/2011   Procedure: VIDEO BRONCHOSCOPY WITH FLUORO;  Surgeon: Andrea Gobble, MD;  Location: [facility];  Service: Cardiopulmonary;  Laterality: Bilateral;   VIDEO BRONCHOSCOPY  06/02/2011   Procedure: VIDEO BRONCHOSCOPY;  Surgeon: Grace Isaac, MD;  Location: [facility];  Service: Thoracic;  Laterality: N/A;    Family History  Problem Relation Age of Onset   Asthma Mother    Heart disease Mother    Lung cancer Father    Kidney cancer Sister    Cancer Sister        kidney   Emphysema Sister    Colon cancer Brother        late 63's   Liver cancer Brother    Asthma Maternal Grandmother    Ovarian cancer Maternal Aunt    Breast cancer Maternal Aunt        x2   Prostate cancer Maternal Uncle    Anesthesia problems Neg Hx    Esophageal cancer Neg Hx    Stomach cancer Neg Hx     Social History Social History   Tobacco Use   Smoking status: Former    Packs/day: 1.00    Years: 45.00    Total pack years: 45.00    Types: Cigarettes    Quit date: 04/13/2012    Years since quitting: 9.8   Smokeless tobacco: Never  Vaping Use   Vaping Use: Never used  Substance Use Topics   Alcohol use: No   Drug use: Yes    Types: Marijuana    Comment: 12/29/20 daily use  per pt    Current Outpatient Medications  Medication Sig Dispense Refill   albuterol (VENTOLIN HFA) 108 (90 Base) MCG/ACT inhaler      alendronate (FOSAMAX) 70  MG tablet 1 tablet 30 minutes before the first food, beverage or medicine of the day with plain water     aspirin EC 81 MG tablet Take 81 mg by mouth daily.     atorvastatin (LIPITOR) 20 MG tablet Take 20 mg by mouth at bedtime.      buPROPion (WELLBUTRIN SR) 150 MG 12 hr tablet Take 1 tablet by mouth daily.     carvedilol (COREG) 3.125 MG tablet Take 3.125 mg by mouth every morning.     Cholecalciferol (VITAMIN D) 2000 units tablet Take 2,000 Units by mouth daily.     diltiazem (CARDIZEM CD) 120 MG 24 hr capsule TAKE 1 CAPSULE (120 MG TOTAL) BY MOUTH DAILY. 30 capsule 7   escitalopram (LEXAPRO) 20 MG tablet Take 20 mg by mouth every evening.   3   levothyroxine (SYNTHROID) 88 MCG tablet Take 88 mcg by mouth every morning.     naproxen sodium (ANAPROX) 220 MG tablet Take 440 mg by mouth every 12 (twelve) hours as needed (pain).     ondansetron (ZOFRAN) 4 MG tablet      pantoprazole (PROTONIX) 40 MG tablet Take 1 tablet by mouth daily.     Tiotropium Bromide-Olodaterol (STIOLTO RESPIMAT) 2.5-2.5 MCG/ACT AERS Inhale 2 puffs into the lungs daily. 4 g 0   Tiotropium Bromide-Olodaterol (STIOLTO RESPIMAT) 2.5-2.5 MCG/ACT AERS Inhale 2 puffs into the lungs daily. 4 g 3   vitamin B-12 (CYANOCOBALAMIN) 1000 MCG tablet Take 5,000 mcg by mouth daily.     zolpidem (AMBIEN) 10 MG tablet Take 10 mg by mouth at bedtime.      Current Facility-Administered Medications  Medication Dose Route Frequency Provider Last Rate Last Admin   0.9 %  sodium chloride infusion  500 mL Intravenous Once Pyrtle, Lajuan Lines, MD        Allergies  Allergen Reactions   Oxycodone Itching   Sulfa Antibiotics Hives   Morphine And Related Itching    Review of Systems  Constitutional:  Positive for fatigue. Negative for activity change, appetite change and unexpected  weight change.  HENT:  Negative for trouble swallowing and voice change.   Eyes:  Negative for visual disturbance.  Respiratory:  Positive for cough and shortness of breath (With exertion). Negative for wheezing.   Cardiovascular:  Negative for chest pain and leg swelling.  Gastrointestinal:  Positive for abdominal pain (Reflux).  Musculoskeletal:        Leg cramps  Neurological:        Memory problems  Psychiatric/Behavioral:  Positive for dysphoric mood. The patient is nervous/anxious.   All other systems reviewed and are negative.   BP (!) 149/73   Pulse 71   Resp 20   Ht 5\' 6"  (1.676 m)   Wt 151 lb (68.5 kg)   SpO2 97% Comment: RA  BMI 24.37 kg/m  Physical Exam Vitals reviewed.  Constitutional:      General: She is not in acute distress.    Appearance: Normal appearance.  HENT:     Head: Normocephalic and atraumatic.     Comments: Subcutaneous nodule scalp along hairline Eyes:     General: No scleral icterus. Neck:     Vascular: No carotid bruit.  Cardiovascular:     Rate and Rhythm: Normal rate and regular rhythm.     Heart sounds: Normal heart sounds. No murmur heard. Pulmonary:     Effort: Pulmonary effort is normal.     Breath sounds: Normal breath sounds. No wheezing or rales.  Abdominal:     General: There is no distension.     Palpations: Abdomen is soft.  Skin:    General: Skin is warm and dry.  Neurological:     General: No focal deficit present.     Mental Status: She is alert and oriented to person, place, and time.     Cranial Nerves: No cranial nerve deficit.     Motor: No weakness.      Diagnostic Tests: NUCLEAR MEDICINE PET SKULL BASE TO THIGH   TECHNIQUE: 7.8 mCi F-18 FDG was injected intravenously. Full-ring PET imaging was performed from the skull base to thigh after the radiotracer. CT data was obtained and used for attenuation correction and anatomic localization.   Fasting blood glucose: 89 mg/dl   COMPARISON:  Chest CT November 20, 2021 CT abdomen pelvis April 14, 2011   FINDINGS: Mediastinal blood pool activity: SUV max 1.6   Liver activity: SUV max NA   NECK: No hypermetabolic cervical adenopathy.   Homogeneous hypermetabolic activity in the thyroid gland with a max SUV of 6.6.   Incidental CT findings: None.   CHEST: Low-level metabolic activity in the spiculated peripheral right upper lobe pulmonary nodule measuring 7 mm on image 25/7 with a max SUV of 1.7.   No significant abnormal hypermetabolic activity in the additional scattered subcentimeter pulmonary nodules measuring up to 5 mm in the right upper lobe on image 22/7. However, all of these nodules are below the resolution of PET.   No hypermetabolic thoracic adenopathy.   Incidental CT findings: Centrilobular and paraseptal emphysema. Scattered bilateral pleuroparenchymal scarring. Aortic athero sclerosus. Coronary artery calcifications.   ABDOMEN/PELVIS: No abnormal hypermetabolic activity within the liver, pancreas, adrenal glands, or spleen. No hypermetabolic lymph nodes in the abdomen or pelvis.   Incidental CT findings: Hypodense 3.3 cm left adrenal nodule measures Hounsfield units of -7 with metabolic activity less than that of background liver, measuring a max SUV of 2.5 consistent with a benign adrenal adenoma requiring no independent imaging follow-up. Hypodense 3.7 cm lesion in the left renal sinus measures fluid density consistent with a cyst and considered benign requiring no independent imaging follow-up. Nonobstructive left renal stones measure up to 6 mm. Prior partial colectomy with anastomotic sutures in the right hemiabdomen. Colonic diverticulosis without findings of acute diverticulitis. Aortic atherosclerosis.   SKELETON: No focal hypermetabolic activity to suggest skeletal metastasis.   Incidental CT findings: None.   IMPRESSION: 1. Low-level metabolic activity in the spiculated right upper lobe pulmonary  nodule is suspicious for primary pulmonary neoplasm, consider further evaluation with direct tissue sampling. 2. No hypermetabolic adenopathy or evidence of hypermetabolic metastatic disease in the chest, abdomen or pelvis. 3. No significant abnormal hypermetabolic activity in the additional scattered subcentimeter pulmonary nodules all which are below the resolution of PET-CT, continued attention on follow-up imaging suggested. 4. Diffuse homogeneous hypermetabolic thyroid activity most commonly reflects thyroiditis suggest correlation with laboratory values. 5.  Aortic Atherosclerosis (ICD10-I70.0).     Electronically Signed   By: Dahlia Bailiff M.D.   On: 12/08/2021 16:49 I personally reviewed the CT and PET/CT images.  There is an 8 mm nodule in the lateral right upper lobe that shows mild metabolic activity.  No evidence of regional or distant metastatic disease.  Pulmonary function testing FVC 2.64 (80%) FEV1 1.80 (72%) FEV1 2.17 (88%) postbronchodilator DLCO 14.51 (68%)  Impression: Andrea Morgan is a 71 year old woman with a history of remote tobacco abuse, COPD, solitary fibrous tumor of the pleura,  hyper lipidemia, hypothyroidism, anemia, degenerative disc disease, hiatal hernia with reflux, anxiety, and depression.  She smoked about a pack a day for 45 years prior to quitting in 2013.  She was found to have a right upper lobe lung nodule on a low-dose screening CT about 6 months ago.  On 30-month follow-up the nodule had increased in size to about 8 mm.  On PET/CT there is mild uptake.  That is significant given the small size of the nodule.  No evidence of hilar or mediastinal adenopathy or distant metastatic disease.  Differential diagnosis includes primary bronchogenic carcinoma, infection, and inflammatory nodules.  Given her smoking history and the appearance of the nodule this is most likely a new primary bronchogenic carcinoma.  It has to be considered that unless can  be proven otherwise.  We discussed potential treatment options for the nodule including surgical resection and stereotactic radiation.  She understands the relative advantages and disadvantages of each approach.  This likely would be amenable to a wedge resection but she does have some significant emphysema in the upper lobe and might require lobectomy if it appears she might have problematic air leaks.  She has had previous surgery on that side remove a large solitary fibrous tumor of the pleura.  She may or may not have significant adhesion issues.  It is possible she might require conversion to thoracotomy.  I offered her the option of surgical resection and she prefers that to radiation.  I informed her of the general nature of the procedure including the need for general anesthesia, the incisions to be used, the use of the surgical robot, the possible conversion to an open procedure, the use of a drainage tube postoperatively, the expected hospital stay, and the overall recovery.  I informed her of the indications, risk, benefits, and alternatives.  She understands the risks include, but are not limited to death, MI, DVT, PE, bleeding, possible need for transfusion, infection, prolonged air leaks, cardiac arrhythmias, as well as the possibility of other unforeseeable complications.  Plan: Robotic assisted right VATS for right upper lobe wedge resection, possible thoracotomy, possible lobectomy on Monday, 03/01/2022  Melrose Nakayama, MD [facility] [phone]

## 2022-02-09 NOTE — Progress Notes (Signed)
PCP is [name], MD Referring Provider is Andrea Gobble, MD  Chief Complaint  Patient presents with   Lung Lesion    Surgical consult    HPI: Ms. Andrea Morgan is sent for consultation regarding a right upper lobe lung nodule.  Andrea Morgan is a 71 year old woman with a history of remote tobacco abuse, COPD, solitary fibrous tumor of the pleura, hyper lipidemia, hypothyroidism, anemia, degenerative disc disease, hiatal hernia with reflux, anxiety, and depression.  She smoked about a pack a day for 45 years prior to quitting in 2013.  In 2013 she had a right thoracotomy for resection of a solitary fibrous tumor of the pleura by Dr. Servando Snare.  She did well with that procedure without any major complications.  She had a low-dose CT for lung cancer screening in December 2022.  It showed multiple pulmonary nodules in the setting of emphysema.  The most suspicious nodules in the periphery of the right upper lobe.  A follow-up scan in August showed that nodule had increased in size slightly.  A PET/CT was done which showed the nodule was mildly hypermetabolic.  There was no evidence of regional or distant metastatic disease.  She gets short of breath with exertion but can walk up a flight of stairs without stopping.  She has a chronic cough but that is better with the new inhaler that Dr. Lamonte Sakai prescribed.  She does have a hiatal hernia and reflux with frequent symptoms.  No exertional chest pain, pressure, or tightness.  Zubrod Score: At the time of surgery this patient's most appropriate activity status/level should be described as: []     0    Normal activity, no symptoms [x]     1    Restricted in physical strenuous activity but ambulatory, able to do out light work []     2    Ambulatory and capable of self care, unable to do work activities, up and about >50 % of waking hours                              []     3    Only limited self care, in bed greater than 50% of waking hours []     4     Completely disabled, no self care, confined to bed or chair []     5    Moribund  Past Medical History:  Diagnosis Date   Adrenal adenoma    Anal condyloma    Anemia    Anxiety    Arthritis    arthritis left jaw / pain     Cancer ([place]) 2013   lung cancer   DDD (degenerative disc disease), thoracic    Depression    Dyspnea on exertion    Emphysema/COPD (HCC)    GERD (gastroesophageal reflux disease)    watches diet   Hiatal hernia    History of adenomatous polyp of colon    06/ 2016 hyperplastic   History of cancer of lower lobe bronchus or lung followed by dr gerhardt-- lov 01/ 2017   06-02-2011  s/p  Right VATS w/ left mini thoracotomy resection 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)   History of condyloma acuminatum    removal anal canal condyloma 11-08-2013   History of kidney stones    History of panic attacks    Hyperlipidemia    Hypothyroidism    Insomnia    Liver cyst  per CT in 2012   PAF (paroxysmal atrial fibrillation) [facility]) cardiologist-  dr Mare Ferrari   single episode Atrial Fibrillation w/ RVR post-op day 3 the right colectomy surgery on 11-08-2013       PONV (postoperative nausea and vomiting)    Pulmonary nodule, right    right upper lobe per last Chest CT 05-14-2015    Past Surgical History:  Procedure Laterality Date   [place]   C5 -- C7   COLONOSCOPY  last one 10-17-2014   ESOPHAGOGASTRODUODENOSCOPY  05/21/2011   Procedure: ESOPHAGOGASTRODUODENOSCOPY (EGD);  Surgeon: Beryle Beams, MD;  Location: [facility];  Service: Endoscopy;  Laterality: N/A;   EXAMINATION UNDER ANESTHESIA N/A 11/08/2013   Procedure: ANAL EXAM UNDER ANESTHESIA WITH BIOPSY;  Surgeon: [name], MD;  Location: [facility];  Service: General;  Laterality: N/A;   LAPAROSCOPIC PARTIAL COLECTOMY N/A 11/08/2013   Procedure: laparoscopic partial right colectomy;  Surgeon: [name], MD;  Location: [facility];  Service:  General;  Laterality: N/A;   MASS EXCISION  06/02/2011   Procedure: EXCISION MASS;  Surgeon: Grace Isaac, MD;  Location: [facility];  Service: Thoracic;;  minithoracotomy resection spindle cell tumor right chest   MASS EXCISION N/A 05/06/2016   Procedure: EXCISION ANAL CONDYLOMA;  Surgeon: [name], MD;  Location: [facility];  Service: General;  Laterality: N/A;   THORACIC DISCECTOMY Right 01/13/2016   Procedure: RIGHT THORACIC ONE THORACIC TWO THORACIC LAMINOTOMY AND MICRODISCECTOMY;  Surgeon: Jovita Gamma, MD;  Location: [facility];  Service: Neurosurgery;  Laterality: Right;   TONSILLECTOMY  1971   TRANSTHORACIC ECHOCARDIOGRAM  08/28/2014   ef 55-60%/  trivial MR and TR   VIDEO BRONCHOSCOPY  05/11/2011   Procedure: VIDEO BRONCHOSCOPY WITH FLUORO;  Surgeon: Andrea Gobble, MD;  Location: [facility];  Service: Cardiopulmonary;  Laterality: Bilateral;   VIDEO BRONCHOSCOPY  06/02/2011   Procedure: VIDEO BRONCHOSCOPY;  Surgeon: Grace Isaac, MD;  Location: [facility];  Service: Thoracic;  Laterality: N/A;    Family History  Problem Relation Age of Onset   Asthma Mother    Heart disease Mother    Lung cancer Father    Kidney cancer Sister    Cancer Sister        kidney   Emphysema Sister    Colon cancer Brother        late 6's   Liver cancer Brother    Asthma Maternal Grandmother    Ovarian cancer Maternal Aunt    Breast cancer Maternal Aunt        x2   Prostate cancer Maternal Uncle    Anesthesia problems Neg Hx    Esophageal cancer Neg Hx    Stomach cancer Neg Hx     Social History Social History   Tobacco Use   Smoking status: Former    Packs/day: 1.00    Years: 45.00    Total pack years: 45.00    Types: Cigarettes    Quit date: 04/13/2012    Years since quitting: 9.8   Smokeless tobacco: Never  Vaping Use   Vaping Use: Never used  Substance Use Topics   Alcohol use: No   Drug use: Yes    Types: Marijuana    Comment: 12/29/20 daily use  per pt    Current Outpatient Medications  Medication Sig Dispense Refill   albuterol (VENTOLIN HFA) 108 (90 Base) MCG/ACT inhaler      alendronate (FOSAMAX) 70  MG tablet 1 tablet 30 minutes before the first food, beverage or medicine of the day with plain water     aspirin EC 81 MG tablet Take 81 mg by mouth daily.     atorvastatin (LIPITOR) 20 MG tablet Take 20 mg by mouth at bedtime.      buPROPion (WELLBUTRIN SR) 150 MG 12 hr tablet Take 1 tablet by mouth daily.     carvedilol (COREG) 3.125 MG tablet Take 3.125 mg by mouth every morning.     Cholecalciferol (VITAMIN D) 2000 units tablet Take 2,000 Units by mouth daily.     diltiazem (CARDIZEM CD) 120 MG 24 hr capsule TAKE 1 CAPSULE (120 MG TOTAL) BY MOUTH DAILY. 30 capsule 7   escitalopram (LEXAPRO) 20 MG tablet Take 20 mg by mouth every evening.   3   levothyroxine (SYNTHROID) 88 MCG tablet Take 88 mcg by mouth every morning.     naproxen sodium (ANAPROX) 220 MG tablet Take 440 mg by mouth every 12 (twelve) hours as needed (pain).     ondansetron (ZOFRAN) 4 MG tablet      pantoprazole (PROTONIX) 40 MG tablet Take 1 tablet by mouth daily.     Tiotropium Bromide-Olodaterol (STIOLTO RESPIMAT) 2.5-2.5 MCG/ACT AERS Inhale 2 puffs into the lungs daily. 4 g 0   Tiotropium Bromide-Olodaterol (STIOLTO RESPIMAT) 2.5-2.5 MCG/ACT AERS Inhale 2 puffs into the lungs daily. 4 g 3   vitamin B-12 (CYANOCOBALAMIN) 1000 MCG tablet Take 5,000 mcg by mouth daily.     zolpidem (AMBIEN) 10 MG tablet Take 10 mg by mouth at bedtime.      Current Facility-Administered Medications  Medication Dose Route Frequency Provider Last Rate Last Admin   0.9 %  sodium chloride infusion  500 mL Intravenous Once Andrea Morgan, Andrea Lines, MD        Allergies  Allergen Reactions   Oxycodone Itching   Sulfa Antibiotics Hives   Morphine And Related Itching    Review of Systems  Constitutional:  Positive for fatigue. Negative for activity change, appetite change and unexpected  weight change.  HENT:  Negative for trouble swallowing and voice change.   Eyes:  Negative for visual disturbance.  Respiratory:  Positive for cough and shortness of breath (With exertion). Negative for wheezing.   Cardiovascular:  Negative for chest pain and leg swelling.  Gastrointestinal:  Positive for abdominal pain (Reflux).  Musculoskeletal:        Leg cramps  Neurological:        Memory problems  Psychiatric/Behavioral:  Positive for dysphoric mood. The patient is nervous/anxious.   All other systems reviewed and are negative.   BP (!) 149/73   Pulse 71   Resp 20   Ht 5\' 6"  (1.676 m)   Wt 151 lb (68.5 kg)   SpO2 97% Comment: RA  BMI 24.37 kg/m  Physical Exam Vitals reviewed.  Constitutional:      General: She is not in acute distress.    Appearance: Normal appearance.  HENT:     Head: Normocephalic and atraumatic.     Comments: Subcutaneous nodule scalp along hairline Eyes:     General: No scleral icterus. Neck:     Vascular: No carotid bruit.  Cardiovascular:     Rate and Rhythm: Normal rate and regular rhythm.     Heart sounds: Normal heart sounds. No murmur heard. Pulmonary:     Effort: Pulmonary effort is normal.     Breath sounds: Normal breath sounds. No wheezing or rales.  Abdominal:     General: There is no distension.     Palpations: Abdomen is soft.  Skin:    General: Skin is warm and dry.  Neurological:     General: No focal deficit present.     Mental Status: She is alert and oriented to person, place, and time.     Cranial Nerves: No cranial nerve deficit.     Motor: No weakness.      Diagnostic Tests: NUCLEAR MEDICINE PET SKULL BASE TO THIGH   TECHNIQUE: 7.8 mCi F-18 FDG was injected intravenously. Full-ring PET imaging was performed from the skull base to thigh after the radiotracer. CT data was obtained and used for attenuation correction and anatomic localization.   Fasting blood glucose: 89 mg/dl   COMPARISON:  Chest CT November 20, 2021 CT abdomen pelvis April 14, 2011   FINDINGS: Mediastinal blood pool activity: SUV max 1.6   Liver activity: SUV max NA   NECK: No hypermetabolic cervical adenopathy.   Homogeneous hypermetabolic activity in the thyroid gland with a max SUV of 6.6.   Incidental CT findings: None.   CHEST: Low-level metabolic activity in the spiculated peripheral right upper lobe pulmonary nodule measuring 7 mm on image 25/7 with a max SUV of 1.7.   No significant abnormal hypermetabolic activity in the additional scattered subcentimeter pulmonary nodules measuring up to 5 mm in the right upper lobe on image 22/7. However, all of these nodules are below the resolution of PET.   No hypermetabolic thoracic adenopathy.   Incidental CT findings: Centrilobular and paraseptal emphysema. Scattered bilateral pleuroparenchymal scarring. Aortic athero sclerosus. Coronary artery calcifications.   ABDOMEN/PELVIS: No abnormal hypermetabolic activity within the liver, pancreas, adrenal glands, or spleen. No hypermetabolic lymph nodes in the abdomen or pelvis.   Incidental CT findings: Hypodense 3.3 cm left adrenal nodule measures Hounsfield units of -7 with metabolic activity less than that of background liver, measuring a max SUV of 2.5 consistent with a benign adrenal adenoma requiring no independent imaging follow-up. Hypodense 3.7 cm lesion in the left renal sinus measures fluid density consistent with a cyst and considered benign requiring no independent imaging follow-up. Nonobstructive left renal stones measure up to 6 mm. Prior partial colectomy with anastomotic sutures in the right hemiabdomen. Colonic diverticulosis without findings of acute diverticulitis. Aortic atherosclerosis.   SKELETON: No focal hypermetabolic activity to suggest skeletal metastasis.   Incidental CT findings: None.   IMPRESSION: 1. Low-level metabolic activity in the spiculated right upper lobe pulmonary  nodule is suspicious for primary pulmonary neoplasm, consider further evaluation with direct tissue sampling. 2. No hypermetabolic adenopathy or evidence of hypermetabolic metastatic disease in the chest, abdomen or pelvis. 3. No significant abnormal hypermetabolic activity in the additional scattered subcentimeter pulmonary nodules all which are below the resolution of PET-CT, continued attention on follow-up imaging suggested. 4. Diffuse homogeneous hypermetabolic thyroid activity most commonly reflects thyroiditis suggest correlation with laboratory values. 5.  Aortic Atherosclerosis (ICD10-I70.0).     Electronically Signed   By: Andrea Morgan M.D.   On: 12/08/2021 16:Andrea Morgan I personally reviewed the CT and PET/CT images.  There is an 8 mm nodule in the lateral right upper lobe that shows mild metabolic activity.  No evidence of regional or distant metastatic disease.  Pulmonary function testing FVC 2.64 (80%) FEV1 1.80 (72%) FEV1 2.17 (88%) postbronchodilator DLCO 14.51 (68%)  Impression: Andrea Morgan is a 71 year old woman with a history of remote tobacco abuse, COPD, solitary fibrous tumor of the pleura,  hyper lipidemia, hypothyroidism, anemia, degenerative disc disease, hiatal hernia with reflux, anxiety, and depression.  She smoked about a pack a day for 45 years prior to quitting in 2013.  She was found to have a right upper lobe lung nodule on a low-dose screening CT about 6 months ago.  On 67-month follow-up the nodule had increased in size to about 8 mm.  On PET/CT there is mild uptake.  That is significant given the small size of the nodule.  No evidence of hilar or mediastinal adenopathy or distant metastatic disease.  Differential diagnosis includes primary bronchogenic carcinoma, infection, and inflammatory nodules.  Given her smoking history and the appearance of the nodule this is most likely a new primary bronchogenic carcinoma.  It has to be considered that unless can  be proven otherwise.  We discussed potential treatment options for the nodule including surgical resection and stereotactic radiation.  She understands the relative advantages and disadvantages of each approach.  This likely would be amenable to a wedge resection but she does have some significant emphysema in the upper lobe and might require lobectomy if it appears she might have problematic air leaks.  She has had previous surgery on that side remove a large solitary fibrous tumor of the pleura.  She may or may not have significant adhesion issues.  It is possible she might require conversion to thoracotomy.  I offered her the option of surgical resection and she prefers that to radiation.  I informed her of the general nature of the procedure including the need for general anesthesia, the incisions to be used, the use of the surgical robot, the possible conversion to an open procedure, the use of a drainage tube postoperatively, the expected hospital stay, and the overall recovery.  I informed her of the indications, risk, benefits, and alternatives.  She understands the risks include, but are not limited to death, MI, DVT, PE, bleeding, possible need for transfusion, infection, prolonged air leaks, cardiac arrhythmias, as well as the possibility of other unforeseeable complications.  Plan: Robotic assisted right VATS for right upper lobe wedge resection, possible thoracotomy, possible lobectomy on Monday, 03/01/2022  Melrose Nakayama, MD [facility] [phone]

## 2022-02-25 ENCOUNTER — Encounter (HOSPITAL_COMMUNITY): Payer: Self-pay

## 2022-02-25 NOTE — Pre-Procedure Instructions (Signed)
Surgical Instructions    Your procedure is scheduled on Monday 03/01/22.   Report to [facility] Main Entrance "A" at 07:15 A.M., then check in with the Admitting office.  Call this number if you have problems the morning of surgery:  [phone]   If you have any questions prior to your surgery date call [phone]: Open Monday-Friday 8am-4pm If you experience any cold or flu symptoms such as cough, fever, chills, shortness of breath, etc. between now and your scheduled surgery, please notify us at the above number     Remember:  Do not eat or drink after midnight the night before your surgery   Take these medicines the morning of surgery with A SIP OF WATER:   alendronate (FOSAMAX)   buPROPion (WELLBUTRIN SR)   carvedilol (COREG)   diltiazem (CARDIZEM CD)   levothyroxine (SYNTHROID)   pantoprazole (PROTONIX)   Tiotropium Bromide-Olodaterol (STIOLTO RESPIMAT)        Take these medicines if needed:   albuterol (VENTOLIN HFA) 108 (90 Base)   As of today, STOP taking any Aspirin (unless otherwise instructed by your surgeon) Aleve, Naproxen, Ibuprofen, Motrin, Advil, Goody's, BC's, all herbal medications, fish oil, and all vitamins.           Do not wear jewelry or makeup. Do not wear lotions, powders, perfumes/cologne or deodorant. Do not shave 48 hours prior to surgery.  Men may shave face and neck. Do not bring valuables to the hospital. Do not wear nail polish, gel polish, artificial nails, or any other type of covering on natural nails (fingers and toes) If you have artificial nails or gel coating that need to be removed by a nail salon, please have this removed prior to surgery. Artificial nails or gel coating may interfere with anesthesia's ability to adequately monitor your vital signs.  [facility] is not responsible for any belongings or valuables.    Do NOT Smoke (Tobacco/Vaping)  24 hours prior to your procedure  If you use a CPAP at night, you may bring your  mask for your overnight stay.   Contacts, glasses, hearing aids, dentures or partials may not be worn into surgery, please bring cases for these belongings   For patients admitted to the hospital, discharge time will be determined by your treatment team.   Patients discharged the day of surgery will not be allowed to drive home, and someone needs to stay with them for 24 hours.   SURGICAL WAITING ROOM VISITATION Patients having surgery or a procedure may have no more than 2 support people in the waiting area - these visitors may rotate.   Children under the age of 78 must have an adult with them who is not the patient. If the patient needs to stay at the hospital during part of their recovery, the visitor guidelines for inpatient rooms apply. Pre-op nurse will coordinate an appropriate time for 1 support person to accompany patient in pre-op.  This support person may not rotate.   Please refer to [email or web address] for the visitor guidelines for Inpatients (after your surgery is over and you are in a regular room).    Special instructions:    Oral Hygiene is also important to reduce your risk of infection.  Remember - BRUSH YOUR TEETH THE MORNING OF SURGERY WITH YOUR REGULAR TOOTHPASTE   [facility]- Preparing For Surgery  Before surgery, you can play an important role. Because skin is not sterile, your skin needs to be as free of germs as possible.  You can reduce the number of germs on your skin by washing with CHG (chlorahexidine gluconate) Soap before surgery.  CHG is an antiseptic cleaner which kills germs and bonds with the skin to continue killing germs even after washing.     Please do not use if you have an allergy to CHG or antibacterial soaps. If your skin becomes reddened/irritated stop using the CHG.  Do not shave (including legs and underarms) for at least 48 hours prior to first CHG shower. It is OK to shave your  face.  Please follow these instructions carefully.     Shower the NIGHT BEFORE SURGERY and the MORNING OF SURGERY with CHG Soap.   If you chose to wash your hair, wash your hair first as usual with your normal shampoo. After you shampoo, rinse your hair and body thoroughly to remove the shampoo.  Then [employer] and genitals (private parts) with your normal soap and rinse thoroughly to remove soap.  After that Use CHG Soap as you would any other liquid soap. You can apply CHG directly to the skin and wash gently with a scrungie or a clean washcloth.   Apply the CHG Soap to your body ONLY FROM THE NECK DOWN.  Do not use on open wounds or open sores. Avoid contact with your eyes, ears, mouth and genitals (private parts). Wash Face and genitals (private parts)  with your normal soap.   Wash thoroughly, paying special attention to the area where your surgery will be performed.  Thoroughly rinse your body with warm water from the neck down.  DO NOT shower/wash with your normal soap after using and rinsing off the CHG Soap.  Pat yourself dry with a CLEAN TOWEL.  Wear CLEAN PAJAMAS to bed the night before surgery  Place CLEAN SHEETS on your bed the night before your surgery  DO NOT SLEEP WITH PETS.   Day of Surgery:  Take a shower with CHG soap. Wear Clean/Comfortable clothing the morning of surgery Do not apply any deodorants/lotions.   Remember to brush your teeth WITH YOUR REGULAR TOOTHPASTE.    If you received a COVID test during your pre-op visit, it is requested that you wear a mask when out in public, stay away from anyone that may not be feeling well, and notify your surgeon if you develop symptoms. If you have been in contact with anyone that has tested positive in the last 10 days, please notify your surgeon.    Please read over the following fact sheets that you were given.

## 2022-02-25 NOTE — Progress Notes (Signed)
Internal Med - Dr [name] Cardiologist - n/a Pulmonology - Dr Baltazar Apo  Chest x-ray - 02/26/22 EKG - 02/26/22 Stress Test - Yes - years ago per patient ECHO - n/a Cardiac Cath - n/a  ICD Pacemaker/Loop - n/a  Sleep Study -  n/a CPAP - none  Aspirin Instructions: Follow your surgeon's instructions on when to stop aspirin prior to surgery,  If no instructions were given by your surgeon then you will need to call the office for those instructions.  Anesthesia review: Yes, Ebony Hail, PA to review chart  STOP now taking any Aspirin (unless otherwise instructed by your surgeon), Aleve, Naproxen, Ibuprofen, Motrin, Advil, Goody's, BC's, all herbal medications, fish oil, and all vitamins.   Coronavirus Screening Do you have any of the following symptoms:  Cough Yes Fever (>100.27F)  yes/no: No Runny nose yes/no: No Sore throat yes/no: No Difficulty breathing/shortness of breath  Yes  Have you traveled in the last 14 days and where? yes/no: No  Patient verbalized understanding of instructions that were given to them at the PAT appointment. Patient was also instructed that they will need to review over the PAT instructions again at home before surgery.

## 2022-02-26 ENCOUNTER — Ambulatory Visit (HOSPITAL_COMMUNITY)
Admission: RE | Admit: 2022-02-26 | Discharge: 2022-02-26 | Disposition: A | Discharge status: Home / Self Care | Payer: Medicare Other | Source: Ambulatory Visit | Attending: Thoracic Surgery (Cardiothoracic Vascular Surgery) | Admitting: Thoracic Surgery (Cardiothoracic Vascular Surgery)

## 2022-02-26 ENCOUNTER — Encounter (HOSPITAL_COMMUNITY)
Admission: RE | Admit: 2022-02-26 | Discharge: 2022-02-26 | Disposition: A | Discharge status: Home / Self Care | Payer: Medicare Other | Source: Ambulatory Visit | Attending: Thoracic Surgery (Cardiothoracic Vascular Surgery) | Admitting: Thoracic Surgery (Cardiothoracic Vascular Surgery)

## 2022-02-26 ENCOUNTER — Other Ambulatory Visit: Payer: Self-pay

## 2022-02-26 ENCOUNTER — Encounter (HOSPITAL_COMMUNITY): Payer: Self-pay

## 2022-02-26 VITALS — BP 136/72 | HR 79 | Temp 97.7°F | Resp 18 | Ht 66.0 in | Wt 151.8 lb

## 2022-02-26 DIAGNOSIS — E039 Hypothyroidism, unspecified: Secondary | ICD-10-CM | POA: Insufficient documentation

## 2022-02-26 DIAGNOSIS — Z01818 Encounter for other preprocedural examination: Secondary | ICD-10-CM | POA: Insufficient documentation

## 2022-02-26 DIAGNOSIS — I498 Other specified cardiac arrhythmias: Secondary | ICD-10-CM | POA: Insufficient documentation

## 2022-02-26 DIAGNOSIS — Z79899 Other long term (current) drug therapy: Secondary | ICD-10-CM | POA: Insufficient documentation

## 2022-02-26 DIAGNOSIS — J439 Emphysema, unspecified: Secondary | ICD-10-CM | POA: Insufficient documentation

## 2022-02-26 DIAGNOSIS — F419 Anxiety disorder, unspecified: Secondary | ICD-10-CM | POA: Insufficient documentation

## 2022-02-26 DIAGNOSIS — Z87891 Personal history of nicotine dependence: Secondary | ICD-10-CM | POA: Insufficient documentation

## 2022-02-26 DIAGNOSIS — D649 Anemia, unspecified: Secondary | ICD-10-CM | POA: Insufficient documentation

## 2022-02-26 DIAGNOSIS — E785 Hyperlipidemia, unspecified: Secondary | ICD-10-CM | POA: Insufficient documentation

## 2022-02-26 DIAGNOSIS — C3490 Malignant neoplasm of unspecified part of unspecified bronchus or lung: Secondary | ICD-10-CM | POA: Insufficient documentation

## 2022-02-26 DIAGNOSIS — R918 Other nonspecific abnormal finding of lung field: Secondary | ICD-10-CM | POA: Insufficient documentation

## 2022-02-26 DIAGNOSIS — R911 Solitary pulmonary nodule: Secondary | ICD-10-CM | POA: Insufficient documentation

## 2022-02-26 DIAGNOSIS — K219 Gastro-esophageal reflux disease without esophagitis: Secondary | ICD-10-CM | POA: Insufficient documentation

## 2022-02-26 HISTORY — DX: Attention-deficit hyperactivity disorder, unspecified type: F90.9

## 2022-02-26 HISTORY — DX: Dyspnea, unspecified: R06.00

## 2022-02-26 HISTORY — DX: Essential (primary) hypertension: I10

## 2022-02-26 LAB — COMPREHENSIVE METABOLIC PANEL
ALT: 34 U/L (ref 0–44)
AST: 38 U/L (ref 15–41)
Albumin: 3.7 g/dL (ref 3.5–5.0)
Alkaline Phosphatase: 51 U/L (ref 38–126)
Anion gap: 10 (ref 5–15)
BUN: 11 mg/dL (ref 8–23)
CO2: 21 mmol/L — ABNORMAL LOW (ref 22–32)
Calcium: 8.8 mg/dL — ABNORMAL LOW (ref 8.9–10.3)
Chloride: 106 mmol/L (ref 98–111)
Creatinine, Ser: 0.78 mg/dL (ref 0.44–1.00)
GFR, Estimated: >60 mL/min (ref >60)
Glucose, Bld: 107 mg/dL — ABNORMAL HIGH (ref 70–99)
Potassium: 4.2 mmol/L (ref 3.5–5.1)
Sodium: 137 mmol/L (ref 135–145)
Total Bilirubin: 0.5 mg/dL (ref 0.3–1.2)
Total Protein: 6.7 g/dL (ref 6.5–8.1)

## 2022-02-26 LAB — PROTIME-INR
INR: 1 (ref 0.8–1.2)
Prothrombin Time: 12.7 seconds (ref 11.4–15.2)

## 2022-02-26 LAB — CBC
HCT: 39.5 % (ref 36.0–46.0)
Hemoglobin: 12.5 g/dL (ref 12.0–15.0)
MCH: 27.1 pg (ref 26.0–34.0)
MCHC: 31.6 g/dL (ref 30.0–36.0)
MCV: 85.7 fL (ref 80.0–100.0)
Platelets: 240 10*3/uL (ref 150–400)
RBC: 4.61 MIL/uL (ref 3.87–5.11)
RDW: 18.6 % — ABNORMAL HIGH (ref 11.5–15.5)
WBC: 9.3 10*3/uL (ref 4.0–10.5)
nRBC: 0 % (ref 0.0–0.2)

## 2022-02-26 LAB — BLOOD GAS, ARTERIAL
Acid-Base Excess: 2 mmol/L (ref 0.0–2.0)
Bicarbonate: 25.6 mmol/L (ref 20.0–28.0)
Drawn by: 6643
O2 Saturation: 98.2 %
Patient temperature: 37
pCO2 arterial: 36 mmHg (ref 32–48)
pH, Arterial: 7.46 — ABNORMAL HIGH (ref 7.35–7.45)
pO2, Arterial: 87 mmHg (ref 83–108)

## 2022-02-26 LAB — SURGICAL PCR SCREEN
MRSA, PCR: NEGATIVE
Staphylococcus aureus: NEGATIVE

## 2022-02-26 LAB — TYPE AND SCREEN
ABO/RH(D): A NEG
Antibody Screen: NEGATIVE

## 2022-02-26 LAB — APTT: aPTT: 24 seconds (ref 24–36)

## 2022-02-26 NOTE — Pre-Procedure Instructions (Signed)
Surgical Instructions    Your procedure is scheduled on Monday 03/01/22.   Report to [facility] Main Entrance "A" at 07:15 A.M., then check in with the Admitting office.  Call this number if you have problems the morning of surgery:  [phone]   If you have any questions prior to your surgery date call [phone]: Open Monday-Friday 8am-4pm If you experience any cold or flu symptoms such as cough, fever, chills, shortness of breath, etc. between now and your scheduled surgery, please notify us at the above number     Remember:  Do not eat or drink after midnight the night before your surgery   Take these medicines the morning of surgery with A SIP OF WATER:   buPROPion (WELLBUTRIN SR)   carvedilol (COREG)   diltiazem (CARDIZEM CD)   levothyroxine (SYNTHROID)   pantoprazole (PROTONIX)   Tiotropium Bromide-Olodaterol (STIOLTO RESPIMAT)   Pristiq  Lexapro     Take these medicines if needed:   albuterol (VENTOLIN HFA) 108 (90 Base)  Xanax if needed     As of today, STOP taking any Aspirin (unless otherwise instructed by your surgeon) Aleve, Naproxen, Ibuprofen, Motrin, Advil, Goody's, BC's, all herbal medications, fish oil, and all vitamins.           Do not wear jewelry or makeup. Do not wear lotions, powders, perfumes or deodorant. Do not shave 48 hours prior to surgery.  Do not bring valuables to the hospital. Do not wear nail polish, gel polish, artificial nails, or any other type of covering on natural nails (fingers and toes) If you have artificial nails or gel coating that need to be removed by a nail salon, please have this removed prior to surgery. Artificial nails or gel coating may interfere with anesthesia's ability to adequately monitor your vital signs.  [facility] is not responsible for any belongings or valuables.    Do NOT Smoke (Tobacco/Vaping)  24 hours prior to your procedure  If you use a CPAP at night, you may bring your mask for your overnight  stay.   Contacts, glasses, hearing aids, dentures or partials may not be worn into surgery, please bring cases for these belongings   For patients admitted to the hospital, discharge time will be determined by your treatment team.   Patients discharged the day of surgery will not be allowed to drive home, and someone needs to stay with them for 24 hours.   SURGICAL WAITING ROOM VISITATION Patients having surgery or a procedure may have no more than 2 support people in the waiting area - these visitors may rotate.   Children under the age of 85 must have an adult with them who is not the patient. If the patient needs to stay at the hospital during part of their recovery, the visitor guidelines for inpatient rooms apply. Pre-op nurse will coordinate an appropriate time for 1 support person to accompany patient in pre-op.  This support person may not rotate.   Please refer to [email or web address] for the visitor guidelines for Inpatients (after your surgery is over and you are in a regular room).    Special instructions:    Oral Hygiene is also important to reduce your risk of infection.  Remember - BRUSH YOUR TEETH THE MORNING OF SURGERY WITH YOUR REGULAR TOOTHPASTE   [place]- Preparing For Surgery  Before surgery, you can play an important role. Because skin is not sterile, your skin needs to be as free of germs as possible. You can  reduce the number of germs on your skin by washing with CHG (chlorahexidine gluconate) Soap before surgery.  CHG is an antiseptic cleaner which kills germs and bonds with the skin to continue killing germs even after washing.     Please do not use if you have an allergy to CHG or antibacterial soaps. If your skin becomes reddened/irritated stop using the CHG.  Do not shave (including legs and underarms) for at least 48 hours prior to first CHG shower. It is OK to shave your face.  Please follow these  instructions carefully.     Shower the NIGHT BEFORE SURGERY and the MORNING OF SURGERY with CHG Soap.   If you chose to wash your hair, wash your hair first as usual with your normal shampoo. After you shampoo, rinse your hair and body thoroughly to remove the shampoo.  Then [employer] and genitals (private parts) with your normal soap and rinse thoroughly to remove soap.  After that Use CHG Soap as you would any other liquid soap. You can apply CHG directly to the skin and wash gently with a scrungie or a clean washcloth.   Apply the CHG Soap to your body ONLY FROM THE NECK DOWN.  Do not use on open wounds or open sores. Avoid contact with your eyes, ears, mouth and genitals (private parts). Wash Face and genitals (private parts)  with your normal soap.   Wash thoroughly, paying special attention to the area where your surgery will be performed.  Thoroughly rinse your body with warm water from the neck down.  DO NOT shower/wash with your normal soap after using and rinsing off the CHG Soap.  Pat yourself dry with a CLEAN TOWEL.  Wear CLEAN PAJAMAS to bed the night before surgery  Place CLEAN SHEETS on your bed the night before your surgery  DO NOT SLEEP WITH PETS.   Day of Surgery:  Take a shower with CHG soap. Wear Clean/Comfortable clothing the morning of surgery Do not apply any deodorants/lotions.   Remember to brush your teeth WITH YOUR REGULAR TOOTHPASTE.    If you received a COVID test during your pre-op visit, it is requested that you wear a mask when out in public, stay away from anyone that may not be feeling well, and notify your surgeon if you develop symptoms. If you have been in contact with anyone that has tested positive in the last 10 days, please notify your surgeon.    Please read over the following fact sheets that you were given.

## 2022-02-26 NOTE — Progress Notes (Signed)
Anesthesia Chart Review:  Case: [id number] Date/Time: 03/01/22 0903   Procedure: XI ROBOTIC ASSISTED THORACOSCOPY- RIGHT UPPER LOBE WEDGE RESECTION, POSSIBLE THORACOTOMY, POSSIBLE LOBECTOMY (Right: Chest)   Anesthesia type: General   Pre-op diagnosis: Right upper lobe lung nodule   Location: [facility] OR ROOM 10 / [place] [facility]   Surgeons: Melrose Nakayama, MD       DISCUSSION: Patient is a 71 year old female scheduled for the above procedure. 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  Earlier this year she had a lung cancer screening chest CT which showed a RUL lung nodules.  There was some increase in size at 6 months follow-up and mild uptake on PET scan.  Per Dr. Leonarda Salon note differential includes bronchogenic carcinoma, infection, and inflammatory nodules, but given her smoking history and appearance of the nodule there is most concern for a new primary bronchogenic carcinoma.  Above surgery recommended for diagnosis and management.  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  Dr. Roxan Hockey classified her Zubrod Score as 1: Restricted in physical strenuous activity but ambulatory, able to do out light work. He noted, she has chronic DOE but can walk up a flight of stairs without stopping.  She remains on Cardizem.  Anesthesia team to evaluate on the day of surgery.  02/26/2022 preoperative chest x-ray is still in  process. She could not provide a urine specimen at PAT, so UA will need to be collected on the day of surgery. If presurgical COVID-19 test not done then will need to done on the day of surgery as indicated.     VS: BP 136/72   Pulse 79   Temp 36.5 C (Oral)   Resp 18   Ht 5\' 6"  (1.676 m)   Wt 68.9 kg   SpO2 98%   BMI 24.50 kg/m    PROVIDERS: [name], MD is PCP  - Baltazar Apo, MD is pulmonologist - Andrey Spearman, MD is neurologist.  Seen on 12/29/2020 for mild memory loss with history of concussion in August 2021.  - She is not followed routinely by cardiology.  She was previously seen by Darlin Coco, MD who is since retired.  Last visit was with Kathrynn Humble, NP on 08/26/2015 for follow-up episode of A-fib with RVR 3 days following colon resection.  She converted to sinus rhythm within 24 hours and had had no known recurrence.  Echocardiogram was unremarkable.  No anticoagulation was recommended given single brief episode.  As needed cardiology follow-up recommended.   LABS: Preoperative labs noted.  (all labs ordered are listed, but only abnormal results are displayed)  Labs Reviewed  CBC - Abnormal; Notable for the following components:      Result Value   RDW 18.6 (*)    All other components within normal limits  COMPREHENSIVE METABOLIC PANEL - Abnormal; Notable for the following components:   CO2 21 (*)    Glucose, Bld 107 (*)    Calcium 8.8 (*)    All other components within normal limits  BLOOD GAS, ARTERIAL - Abnormal; Notable for the following components:  pH, Arterial 7.46 (*)    All other components within normal limits  SURGICAL PCR SCREEN  PROTIME-INR  APTT  URINALYSIS, ROUTINE W REFLEX MICROSCOPIC  TYPE AND SCREEN    PFTs 02/03/22: FVC 2.64 (80%), post 3.09 (94%). FEV1 1.80 (72%), post 2.17 (88%). DLCO unc/cor 14.51 (68%).   IMAGES: PET Scan 12/07/21: IMPRESSION: 1. Low-level metabolic activity in the spiculated right upper  lobe pulmonary nodule is suspicious for primary pulmonary neoplasm, consider further evaluation with direct tissue sampling. 2. No hypermetabolic adenopathy or evidence of hypermetabolic metastatic disease in the chest, abdomen or pelvis. 3. No significant abnormal hypermetabolic activity in the additional scattered subcentimeter pulmonary nodules all which are below the resolution of PET-CT, continued attention on follow-up imaging suggested. 4. Diffuse homogeneous hypermetabolic thyroid activity most commonly reflects thyroiditis suggest correlation with laboratory values. 5.  Aortic Atherosclerosis (ICD10-I70.0).  CT Chest 11/20/21: IMPRESSION: - 1. Lung-RADS 4A, suspicious. Follow up low-dose chest CT without contrast in 3 months (please use the following order, "CT CHEST LCS NODULE FOLLOW-UP W/O CM") is recommended. Alternatively, PET may be considered when there is a solid component 51mm or larger. - 7.7 mm somewhat spiculated lateral right upper lobe nodule, enlarged from 04/16/2021.  MRI Brain 06/05/21: IMPRESSION: No evidence of recent infarction, hemorrhage, or mass. No progressive volume loss. Similar probable mild chronic microvascular ischemic changes.   EKG: 02/26/22: Normal sinus rhythm with sinus arrhythmia. Possible LAE. Negative T wave V1-2. T wave flat in V2 in several previous EKG tracings.    CV: Echo 08/28/14: Study Conclusions  - Left ventricle: The cavity size was normal. Systolic function was    normal. The estimated ejection fraction was in the range of 55%    to 60%. Wall motion was normal; there were no regional wall    motion abnormalities. Left ventricular diastolic function    parameters were normal.    Nuclear stress test 03/23/02: IMPRESSION  1. NO EVIDENCE OF ISCHEMIA OR INFARCTION.  2.  EJECTION FRACTION IS 70%.    Past Medical History:  Diagnosis Date   ADHD (attention deficit hyperactivity disorder)    Adrenal adenoma    Anal  condyloma    Anemia    Anxiety    Arthritis    arthritis left jaw / pain     Cancer ([place]) 2013   lung cancer   DDD (degenerative disc disease), thoracic    Depression    Dyspnea    with exertion   Dyspnea on exertion    Emphysema/COPD ([facility])    GERD (gastroesophageal reflux disease)    Hiatal hernia    causes no problems per patient   History of adenomatous polyp of colon    06/ 2016 hyperplastic   History of cancer of lower lobe bronchus or lung followed by dr gerhardt-- lov 01/ 2017   06-02-2011  s/p  Right VATS w/ left mini thoracotomy resection 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 cardiologist-  dr Mare Ferrari   single episode Atrial Fibrillation w/ RVR post-op day 3 the right colectomy surgery on 11-08-2013  Pneumonia    PONV (postoperative nausea and vomiting)    Pulmonary nodule, right    right upper lobe per last Chest CT 05-14-2015    Past Surgical History:  Procedure Laterality Date   APPENDECTOMY  1978   CERVICAL FUSION  1997   C5 -- C7   COLONOSCOPY  last one 10-17-2014   ESOPHAGOGASTRODUODENOSCOPY  05/21/2011   Procedure: ESOPHAGOGASTRODUODENOSCOPY (EGD);  Surgeon: Beryle Beams, MD;  Location: [facility];  Service: Endoscopy;  Laterality: N/A;   EXAMINATION UNDER ANESTHESIA N/A 11/08/2013   Procedure: ANAL EXAM UNDER ANESTHESIA WITH BIOPSY;  Surgeon: [name], MD;  Location: [facility];  Service: General;  Laterality: N/A;   LAPAROSCOPIC PARTIAL COLECTOMY N/A 11/08/2013   Procedure: laparoscopic partial right colectomy;  Surgeon: [name], MD;  Location: [facility];  Service: General;  Laterality: N/A;   MASS EXCISION  06/02/2011    Procedure: EXCISION MASS;  Surgeon: Grace Isaac, MD;  Location: [facility];  Service: Thoracic;;  minithoracotomy resection spindle cell tumor right chest   MASS EXCISION N/A 05/06/2016   Procedure: EXCISION ANAL CONDYLOMA;  Surgeon: [name], MD;  Location: [facility];  Service: General;  Laterality: N/A;   THORACIC DISCECTOMY Right 01/13/2016   Procedure: RIGHT THORACIC ONE THORACIC TWO THORACIC LAMINOTOMY AND MICRODISCECTOMY;  Surgeon: Jovita Gamma, MD;  Location: [facility];  Service: Neurosurgery;  Laterality: Right;   TONSILLECTOMY  1971   TRANSTHORACIC ECHOCARDIOGRAM  08/28/2014   ef 55-60%/  trivial MR and TR   VIDEO BRONCHOSCOPY  05/11/2011   Procedure: VIDEO BRONCHOSCOPY WITH FLUORO;  Surgeon: Collene Gobble, MD;  Location: [facility];  Service: Cardiopulmonary;  Laterality: Bilateral;   VIDEO BRONCHOSCOPY  06/02/2011   Procedure: VIDEO BRONCHOSCOPY;  Surgeon: Grace Isaac, MD;  Location: [facility];  Service: Thoracic;  Laterality: N/A;    MEDICATIONS:  albuterol (VENTOLIN HFA) 108 (90 Base) MCG/ACT inhaler   alendronate (FOSAMAX) 70 MG tablet   ALPRAZolam (XANAX) 1 MG tablet   ASCORBIC ACID PO   aspirin EC 81 MG tablet   atorvastatin (LIPITOR) 20 MG tablet   buPROPion (WELLBUTRIN SR) 150 MG 12 hr tablet   carvedilol (COREG) 3.125 MG tablet   Cholecalciferol (VITAMIN D-3 PO)   Cyanocobalamin (VITAMIN B-12 PO)   desvenlafaxine (PRISTIQ) 50 MG 24 hr tablet   diltiazem (CARDIZEM CD) 120 MG 24 hr capsule   donepezil (ARICEPT) 5 MG tablet   escitalopram (LEXAPRO) 20 MG tablet   levothyroxine (SYNTHROID) 88 MCG tablet   pantoprazole (PROTONIX) 40 MG tablet   Tiotropium Bromide-Olodaterol (STIOLTO RESPIMAT) 2.5-2.5 MCG/ACT AERS   zolpidem (AMBIEN) 10 MG tablet    0.9 %  sodium chloride infusion    Myra Gianotti, PA-C Surgical Short Stay/Anesthesiology [facility] Phone [phone] [facility] Phone [phone] 02/26/2022 4:16 PM

## 2022-02-26 NOTE — Anesthesia Preprocedure Evaluation (Signed)
Anesthesia Evaluation  Patient identified by MRN, date of birth, ID band Patient awake    Reviewed: Allergy & Precautions, H&P , NPO status , Patient's Chart, lab work & pertinent test results, reviewed documented beta blocker date and time   History of Anesthesia Complications (+) PONV and history of anesthetic complications  Airway Mallampati: III  TM Distance: >3 FB Neck ROM: Full    Dental no notable dental hx. (+) Teeth Intact, Dental Advisory Given   Pulmonary shortness of breath, COPD,  COPD inhaler, former smoker   Pulmonary exam normal breath sounds clear to auscultation       Cardiovascular hypertension, Pt. on medications and Pt. on home beta blockers  Rhythm:Regular Rate:Normal     Neuro/Psych  Headaches  Anxiety Depression       GI/Hepatic Neg liver ROS, hiatal hernia,GERD  Medicated,,  Endo/Other  Hypothyroidism    Renal/GU negative Renal ROS  negative genitourinary   Musculoskeletal  (+) Arthritis , Osteoarthritis,    Abdominal   Peds  Hematology  (+) Blood dyscrasia, anemia   Anesthesia Other Findings   Reproductive/Obstetrics negative OB ROS                             Anesthesia Physical Anesthesia Plan  ASA: 3  Anesthesia Plan: General   Post-op Pain Management: Tylenol PO (pre-op)*   Induction: Intravenous  PONV Risk Score and Plan: 4 or greater and Ondansetron, Dexamethasone, Midazolam and Treatment may vary due to age or medical condition  Airway Management Planned: Double Lumen EBT  Additional Equipment: Arterial line  Intra-op Plan:   Post-operative Plan: Extubation in OR  Informed Consent: I have reviewed the patients History and Physical, chart, labs and discussed the procedure including the risks, benefits and alternatives for the proposed anesthesia with the patient or authorized representative who has indicated his/her understanding and  acceptance.     Dental advisory given  Plan Discussed with: CRNA  Anesthesia Plan Comments: (PAT note written 02/26/2022 by Myra Gianotti, PA-C.  )       Anesthesia Quick Evaluation

## 2022-03-01 ENCOUNTER — Inpatient Hospital Stay (HOSPITAL_COMMUNITY)
Admission: RE | Admit: 2022-03-01 | Discharge: 2022-03-08 | DRG: 164 | Disposition: A | Discharge status: Home / Self Care | Payer: Medicare Other | Attending: Thoracic Surgery (Cardiothoracic Vascular Surgery) | Admitting: Thoracic Surgery (Cardiothoracic Vascular Surgery)

## 2022-03-01 ENCOUNTER — Encounter (HOSPITAL_COMMUNITY): Payer: Self-pay | Admitting: Thoracic Surgery (Cardiothoracic Vascular Surgery)

## 2022-03-01 ENCOUNTER — Other Ambulatory Visit: Payer: Self-pay

## 2022-03-01 ENCOUNTER — Encounter (HOSPITAL_COMMUNITY)
Admission: RE | Disposition: A | Discharge status: Home / Self Care | Payer: Self-pay | Source: Home / Self Care | Attending: Thoracic Surgery (Cardiothoracic Vascular Surgery)

## 2022-03-01 ENCOUNTER — Inpatient Hospital Stay (HOSPITAL_COMMUNITY): Payer: Medicare Other | Admitting: Certified Registered Nurse Anesthetist

## 2022-03-01 ENCOUNTER — Inpatient Hospital Stay (HOSPITAL_COMMUNITY): Payer: Medicare Other | Admitting: Vascular Surgery

## 2022-03-01 ENCOUNTER — Inpatient Hospital Stay (HOSPITAL_COMMUNITY): Payer: Medicare Other

## 2022-03-01 DIAGNOSIS — J948 Other specified pleural conditions: Secondary | ICD-10-CM | POA: Diagnosis present

## 2022-03-01 DIAGNOSIS — Z981 Arthrodesis status: Secondary | ICD-10-CM

## 2022-03-01 DIAGNOSIS — C771 Secondary and unspecified malignant neoplasm of intrathoracic lymph nodes: Secondary | ICD-10-CM | POA: Diagnosis present

## 2022-03-01 DIAGNOSIS — Z1152 Encounter for screening for COVID-19: Secondary | ICD-10-CM

## 2022-03-01 DIAGNOSIS — I480 Paroxysmal atrial fibrillation: Secondary | ICD-10-CM | POA: Diagnosis present

## 2022-03-01 DIAGNOSIS — R911 Solitary pulmonary nodule: Principal | ICD-10-CM | POA: Diagnosis present

## 2022-03-01 DIAGNOSIS — C3411 Malignant neoplasm of upper lobe, right bronchus or lung: Principal | ICD-10-CM | POA: Diagnosis present

## 2022-03-01 DIAGNOSIS — E440 Moderate protein-calorie malnutrition: Secondary | ICD-10-CM | POA: Diagnosis present

## 2022-03-01 DIAGNOSIS — Z885 Allergy status to narcotic agent status: Secondary | ICD-10-CM

## 2022-03-01 DIAGNOSIS — Z79890 Hormone replacement therapy (postmenopausal): Secondary | ICD-10-CM

## 2022-03-01 DIAGNOSIS — Z825 Family history of asthma and other chronic lower respiratory diseases: Secondary | ICD-10-CM

## 2022-03-01 DIAGNOSIS — Z801 Family history of malignant neoplasm of trachea, bronchus and lung: Secondary | ICD-10-CM

## 2022-03-01 DIAGNOSIS — Z882 Allergy status to sulfonamides status: Secondary | ICD-10-CM

## 2022-03-01 DIAGNOSIS — Z87891 Personal history of nicotine dependence: Secondary | ICD-10-CM | POA: Diagnosis not present

## 2022-03-01 DIAGNOSIS — I10 Essential (primary) hypertension: Secondary | ICD-10-CM | POA: Diagnosis present

## 2022-03-01 DIAGNOSIS — D62 Acute posthemorrhagic anemia: Secondary | ICD-10-CM | POA: Diagnosis not present

## 2022-03-01 DIAGNOSIS — F418 Other specified anxiety disorders: Secondary | ICD-10-CM

## 2022-03-01 DIAGNOSIS — Z8249 Family history of ischemic heart disease and other diseases of the circulatory system: Secondary | ICD-10-CM | POA: Diagnosis not present

## 2022-03-01 DIAGNOSIS — D3500 Benign neoplasm of unspecified adrenal gland: Secondary | ICD-10-CM | POA: Diagnosis present

## 2022-03-01 DIAGNOSIS — F32A Depression, unspecified: Secondary | ICD-10-CM | POA: Diagnosis present

## 2022-03-01 DIAGNOSIS — E039 Hypothyroidism, unspecified: Secondary | ICD-10-CM | POA: Diagnosis present

## 2022-03-01 DIAGNOSIS — J432 Centrilobular emphysema: Secondary | ICD-10-CM | POA: Diagnosis present

## 2022-03-01 DIAGNOSIS — Z902 Acquired absence of lung [part of]: Secondary | ICD-10-CM

## 2022-03-01 DIAGNOSIS — J449 Chronic obstructive pulmonary disease, unspecified: Secondary | ICD-10-CM

## 2022-03-01 DIAGNOSIS — E7800 Pure hypercholesterolemia, unspecified: Secondary | ICD-10-CM | POA: Diagnosis present

## 2022-03-01 DIAGNOSIS — K449 Diaphragmatic hernia without obstruction or gangrene: Secondary | ICD-10-CM | POA: Diagnosis present

## 2022-03-01 DIAGNOSIS — Z6824 Body mass index (BMI) 24.0-24.9, adult: Secondary | ICD-10-CM

## 2022-03-01 DIAGNOSIS — E876 Hypokalemia: Secondary | ICD-10-CM | POA: Diagnosis present

## 2022-03-01 DIAGNOSIS — Z79899 Other long term (current) drug therapy: Secondary | ICD-10-CM

## 2022-03-01 DIAGNOSIS — J9382 Other air leak: Secondary | ICD-10-CM | POA: Diagnosis not present

## 2022-03-01 DIAGNOSIS — K219 Gastro-esophageal reflux disease without esophagitis: Secondary | ICD-10-CM | POA: Diagnosis present

## 2022-03-01 DIAGNOSIS — F419 Anxiety disorder, unspecified: Secondary | ICD-10-CM | POA: Diagnosis present

## 2022-03-01 DIAGNOSIS — Z7983 Long term (current) use of bisphosphonates: Secondary | ICD-10-CM

## 2022-03-01 DIAGNOSIS — Z7951 Long term (current) use of inhaled steroids: Secondary | ICD-10-CM

## 2022-03-01 DIAGNOSIS — Z7982 Long term (current) use of aspirin: Secondary | ICD-10-CM

## 2022-03-01 DIAGNOSIS — C3491 Malignant neoplasm of unspecified part of right bronchus or lung: Secondary | ICD-10-CM

## 2022-03-01 DIAGNOSIS — M5134 Other intervertebral disc degeneration, thoracic region: Secondary | ICD-10-CM | POA: Diagnosis present

## 2022-03-01 HISTORY — PX: NODE DISSECTION: SHX5269

## 2022-03-01 HISTORY — PX: INTERCOSTAL NERVE BLOCK: SHX5021

## 2022-03-01 LAB — URINALYSIS, ROUTINE W REFLEX MICROSCOPIC
Bilirubin Urine: NEGATIVE
Glucose, UA: NEGATIVE mg/dL
Hgb urine dipstick: NEGATIVE
Ketones, ur: NEGATIVE mg/dL
Nitrite: NEGATIVE
Protein, ur: NEGATIVE mg/dL
Specific Gravity, Urine: 1.021 (ref 1.005–1.030)
pH: 5 (ref 5.0–8.0)

## 2022-03-01 LAB — SARS CORONAVIRUS 2 BY RT PCR: SARS Coronavirus 2 by RT PCR: NEGATIVE

## 2022-03-01 SURGERY — WEDGE RESECTION, LUNG, ROBOT-ASSISTED, THORACOSCOPIC
Anesthesia: General | Site: Chest | Laterality: Right

## 2022-03-01 MED ORDER — BUPIVACAINE HCL (PF) 0.5 % IJ SOLN
INTRAMUSCULAR | Status: AC
  Filled 2022-03-01: qty 30

## 2022-03-01 MED ORDER — LIDOCAINE 2% (20 MG/ML) 5 ML SYRINGE
INTRAMUSCULAR | Status: DC | PRN
  Administered 2022-03-01: 60 mg via INTRAVENOUS

## 2022-03-01 MED ORDER — ONDANSETRON HCL 4 MG/2ML IJ SOLN
INTRAMUSCULAR | Status: AC
  Filled 2022-03-01: qty 2

## 2022-03-01 MED ORDER — DEXMEDETOMIDINE HCL IN NACL 80 MCG/20ML IV SOLN
INTRAVENOUS | Status: AC
  Filled 2022-03-01: qty 20

## 2022-03-01 MED ORDER — PHENYLEPHRINE 80 MCG/ML (10ML) SYRINGE FOR IV PUSH (FOR BLOOD PRESSURE SUPPORT)
PREFILLED_SYRINGE | INTRAVENOUS | Status: DC | PRN
  Administered 2022-03-01: 160 ug via INTRAVENOUS

## 2022-03-01 MED ORDER — ALBUTEROL SULFATE (2.5 MG/3ML) 0.083% IN NEBU: 3.0000 mL | INHALATION_SOLUTION | Freq: Four times a day (QID) | RESPIRATORY_TRACT | Status: DC | PRN

## 2022-03-01 MED ORDER — ZOLPIDEM TARTRATE 5 MG PO TABS
10.0000 mg | ORAL_TABLET | Freq: Every day | ORAL | Status: DC
  Administered 2022-03-01 – 2022-03-07 (×7): 10 mg via ORAL
  Filled 2022-03-01 (×7): qty 2

## 2022-03-01 MED ORDER — PROPOFOL 10 MG/ML IV BOLUS
INTRAVENOUS | Status: DC | PRN
  Administered 2022-03-01 (×2): 50 mg via INTRAVENOUS
  Administered 2022-03-01: 100 mg via INTRAVENOUS

## 2022-03-01 MED ORDER — ALBUTEROL SULFATE (2.5 MG/3ML) 0.083% IN NEBU
2.5000 mg | INHALATION_SOLUTION | RESPIRATORY_TRACT | Status: DC
  Administered 2022-03-01 – 2022-03-02 (×2): 2.5 mg via RESPIRATORY_TRACT
  Filled 2022-03-01 (×2): qty 3

## 2022-03-01 MED ORDER — HYDROMORPHONE HCL 1 MG/ML IJ SOLN
INTRAMUSCULAR | Status: AC
  Filled 2022-03-01: qty 0.5

## 2022-03-01 MED ORDER — LEVOTHYROXINE SODIUM 88 MCG PO TABS
88.0000 ug | ORAL_TABLET | Freq: Every day | ORAL | Status: DC
  Administered 2022-03-02 – 2022-03-08 (×7): 88 ug via ORAL
  Filled 2022-03-01 (×8): qty 1

## 2022-03-01 MED ORDER — SODIUM CHLORIDE 0.9 % IV SOLN
INTRAVENOUS | Status: DC | PRN
  Administered 2022-03-01: 1000 mL

## 2022-03-01 MED ORDER — CHLORHEXIDINE GLUCONATE 0.12 % MT SOLN
15.0000 mL | Freq: Once | OROMUCOSAL | Status: AC
  Administered 2022-03-01: 15 mL via OROMUCOSAL
  Filled 2022-03-01: qty 15

## 2022-03-01 MED ORDER — LACTATED RINGERS IV SOLN: INTRAVENOUS | Status: DC

## 2022-03-01 MED ORDER — EPHEDRINE SULFATE-NACL 50-0.9 MG/10ML-% IV SOSY
PREFILLED_SYRINGE | INTRAVENOUS | Status: DC | PRN
  Administered 2022-03-01 (×3): 5 mg via INTRAVENOUS
  Administered 2022-03-01: 10 mg via INTRAVENOUS

## 2022-03-01 MED ORDER — CEFAZOLIN SODIUM-DEXTROSE 2-4 GM/100ML-% IV SOLN
2.0000 g | Freq: Three times a day (TID) | INTRAVENOUS | Status: AC
  Administered 2022-03-01 – 2022-03-02 (×2): 2 g via INTRAVENOUS
  Filled 2022-03-01: qty 100

## 2022-03-01 MED ORDER — FENTANYL CITRATE PF 50 MCG/ML IJ SOSY: 25.0000 ug | PREFILLED_SYRINGE | INTRAMUSCULAR | Status: DC | PRN

## 2022-03-01 MED ORDER — LACTATED RINGERS IV SOLN: INTRAVENOUS | Status: DC | PRN

## 2022-03-01 MED ORDER — ONDANSETRON HCL 4 MG/2ML IJ SOLN
INTRAMUSCULAR | Status: AC
  Filled 2022-03-01: qty 4

## 2022-03-01 MED ORDER — ACETAMINOPHEN 160 MG/5ML PO SOLN: 1000.0000 mg | Freq: Four times a day (QID) | ORAL | Status: DC

## 2022-03-01 MED ORDER — ONDANSETRON HCL 4 MG/2ML IJ SOLN
INTRAMUSCULAR | Status: DC | PRN
  Administered 2022-03-01 (×2): 4 mg via INTRAVENOUS

## 2022-03-01 MED ORDER — ONDANSETRON HCL 4 MG/2ML IJ SOLN: 4.0000 mg | Freq: Four times a day (QID) | INTRAMUSCULAR | Status: DC | PRN

## 2022-03-01 MED ORDER — ROCURONIUM BROMIDE 10 MG/ML (PF) SYRINGE
PREFILLED_SYRINGE | INTRAVENOUS | Status: AC
  Filled 2022-03-01: qty 30

## 2022-03-01 MED ORDER — CARVEDILOL 3.125 MG PO TABS
3.1250 mg | ORAL_TABLET | Freq: Once | ORAL | Status: AC
  Administered 2022-03-01: 3.125 mg via ORAL

## 2022-03-01 MED ORDER — GABAPENTIN 600 MG PO TABS
300.0000 mg | ORAL_TABLET | Freq: Every day | ORAL | Status: DC
  Administered 2022-03-01 – 2022-03-07 (×7): 300 mg via ORAL
  Filled 2022-03-01 (×7): qty 1

## 2022-03-01 MED ORDER — PANTOPRAZOLE SODIUM 40 MG PO TBEC
40.0000 mg | DELAYED_RELEASE_TABLET | Freq: Every day | ORAL | Status: DC
  Administered 2022-03-02 – 2022-03-08 (×7): 40 mg via ORAL
  Filled 2022-03-01 (×7): qty 1

## 2022-03-01 MED ORDER — CARVEDILOL 3.125 MG PO TABS
ORAL_TABLET | ORAL | Status: AC
  Filled 2022-03-01: qty 1

## 2022-03-01 MED ORDER — PANTOPRAZOLE SODIUM 40 MG PO TBEC: 40.0000 mg | DELAYED_RELEASE_TABLET | Freq: Every day | ORAL | Status: DC

## 2022-03-01 MED ORDER — HYDROMORPHONE HCL 1 MG/ML IJ SOLN
0.2500 mg | INTRAMUSCULAR | Status: DC | PRN
  Administered 2022-03-01 (×2): 0.25 mg via INTRAVENOUS

## 2022-03-01 MED ORDER — SODIUM CHLORIDE 0.45 % IV SOLN: INTRAVENOUS | Status: DC

## 2022-03-01 MED ORDER — 0.9 % SODIUM CHLORIDE (POUR BTL) OPTIME
TOPICAL | Status: DC | PRN
  Administered 2022-03-01: 2000 mL

## 2022-03-01 MED ORDER — SUGAMMADEX SODIUM 200 MG/2ML IV SOLN
INTRAVENOUS | Status: DC | PRN
  Administered 2022-03-01: 200 mg via INTRAVENOUS

## 2022-03-01 MED ORDER — ENOXAPARIN SODIUM 40 MG/0.4ML IJ SOSY
40.0000 mg | PREFILLED_SYRINGE | Freq: Every day | INTRAMUSCULAR | Status: DC
  Administered 2022-03-02 – 2022-03-07 (×6): 40 mg via SUBCUTANEOUS
  Filled 2022-03-01 (×6): qty 0.4

## 2022-03-01 MED ORDER — ACETAMINOPHEN 500 MG PO TABS
1000.0000 mg | ORAL_TABLET | Freq: Once | ORAL | Status: AC
  Administered 2022-03-01: 1000 mg via ORAL
  Filled 2022-03-01: qty 2

## 2022-03-01 MED ORDER — DEXAMETHASONE SODIUM PHOSPHATE 10 MG/ML IJ SOLN
INTRAMUSCULAR | Status: DC | PRN
  Administered 2022-03-01: 10 mg via INTRAVENOUS

## 2022-03-01 MED ORDER — ATORVASTATIN CALCIUM 10 MG PO TABS
20.0000 mg | ORAL_TABLET | Freq: Every day | ORAL | Status: DC
  Administered 2022-03-01 – 2022-03-07 (×7): 20 mg via ORAL
  Filled 2022-03-01 (×7): qty 2

## 2022-03-01 MED ORDER — EPHEDRINE 5 MG/ML INJ
INTRAVENOUS | Status: AC
  Filled 2022-03-01: qty 5

## 2022-03-01 MED ORDER — FENTANYL CITRATE (PF) 250 MCG/5ML IJ SOLN
INTRAMUSCULAR | Status: DC | PRN
  Administered 2022-03-01: 50 ug via INTRAVENOUS
  Administered 2022-03-01 (×2): 100 ug via INTRAVENOUS

## 2022-03-01 MED ORDER — PHENYLEPHRINE HCL-NACL 20-0.9 MG/250ML-% IV SOLN
INTRAVENOUS | Status: DC | PRN
  Administered 2022-03-01: 20 ug/min via INTRAVENOUS

## 2022-03-01 MED ORDER — ARFORMOTEROL TARTRATE 15 MCG/2ML IN NEBU
15.0000 ug | INHALATION_SOLUTION | Freq: Two times a day (BID) | RESPIRATORY_TRACT | Status: DC
  Administered 2022-03-01 – 2022-03-08 (×14): 15 ug via RESPIRATORY_TRACT
  Filled 2022-03-01 (×14): qty 2

## 2022-03-01 MED ORDER — ASPIRIN 81 MG PO TBEC
81.0000 mg | DELAYED_RELEASE_TABLET | Freq: Every day | ORAL | Status: DC
  Administered 2022-03-02 – 2022-03-08 (×7): 81 mg via ORAL
  Filled 2022-03-01 (×7): qty 1

## 2022-03-01 MED ORDER — ACETAMINOPHEN 500 MG PO TABS
1000.0000 mg | ORAL_TABLET | Freq: Four times a day (QID) | ORAL | Status: DC
  Administered 2022-03-01 – 2022-03-02 (×3): 1000 mg via ORAL
  Filled 2022-03-01 (×3): qty 2

## 2022-03-01 MED ORDER — DEXAMETHASONE SODIUM PHOSPHATE 10 MG/ML IJ SOLN
INTRAMUSCULAR | Status: AC
  Filled 2022-03-01: qty 2

## 2022-03-01 MED ORDER — ALPRAZOLAM 0.5 MG PO TABS: 1.0000 mg | ORAL_TABLET | Freq: Three times a day (TID) | ORAL | Status: DC | PRN

## 2022-03-01 MED ORDER — BUPIVACAINE LIPOSOME 1.3 % IJ SUSP
INTRAMUSCULAR | Status: AC
  Filled 2022-03-01: qty 20

## 2022-03-01 MED ORDER — PHENYLEPHRINE 80 MCG/ML (10ML) SYRINGE FOR IV PUSH (FOR BLOOD PRESSURE SUPPORT)
PREFILLED_SYRINGE | INTRAVENOUS | Status: AC
  Filled 2022-03-01: qty 10

## 2022-03-01 MED ORDER — DILTIAZEM HCL ER COATED BEADS 120 MG PO CP24
120.0000 mg | ORAL_CAPSULE | Freq: Every day | ORAL | Status: DC
  Administered 2022-03-02 – 2022-03-08 (×7): 120 mg via ORAL
  Filled 2022-03-01 (×7): qty 1

## 2022-03-01 MED ORDER — KETOROLAC TROMETHAMINE 15 MG/ML IJ SOLN
15.0000 mg | Freq: Four times a day (QID) | INTRAMUSCULAR | Status: AC
  Administered 2022-03-01 – 2022-03-02 (×5): 15 mg via INTRAVENOUS
  Filled 2022-03-01 (×5): qty 1

## 2022-03-01 MED ORDER — DEXMEDETOMIDINE HCL IN NACL 80 MCG/20ML IV SOLN
INTRAVENOUS | Status: DC | PRN
  Administered 2022-03-01: 20 ug via BUCCAL

## 2022-03-01 MED ORDER — VENLAFAXINE HCL ER 75 MG PO CP24
75.0000 mg | ORAL_CAPSULE | Freq: Every day | ORAL | Status: DC
  Administered 2022-03-02 – 2022-03-08 (×7): 75 mg via ORAL
  Filled 2022-03-01 (×7): qty 1

## 2022-03-01 MED ORDER — DONEPEZIL HCL 10 MG PO TABS
5.0000 mg | ORAL_TABLET | Freq: Every day | ORAL | Status: DC
  Administered 2022-03-01 – 2022-03-07 (×7): 5 mg via ORAL
  Filled 2022-03-01 (×7): qty 1

## 2022-03-01 MED ORDER — CEFAZOLIN SODIUM-DEXTROSE 2-4 GM/100ML-% IV SOLN
2.0000 g | INTRAVENOUS | Status: AC
  Administered 2022-03-01: 2 g via INTRAVENOUS
  Filled 2022-03-01: qty 100

## 2022-03-01 MED ORDER — ROCURONIUM BROMIDE 10 MG/ML (PF) SYRINGE
PREFILLED_SYRINGE | INTRAVENOUS | Status: DC | PRN
  Administered 2022-03-01 (×2): 20 mg via INTRAVENOUS
  Administered 2022-03-01: 60 mg via INTRAVENOUS
  Administered 2022-03-01 (×2): 20 mg via INTRAVENOUS

## 2022-03-01 MED ORDER — HEMOSTATIC AGENTS (NO CHARGE) OPTIME
TOPICAL | Status: DC | PRN
  Administered 2022-03-01: 1 via TOPICAL
  Administered 2022-03-01: 2 via TOPICAL

## 2022-03-01 MED ORDER — UMECLIDINIUM BROMIDE 62.5 MCG/ACT IN AEPB
1.0000 | INHALATION_SPRAY | Freq: Every day | RESPIRATORY_TRACT | Status: DC
  Administered 2022-03-02 – 2022-03-08 (×7): 1 via RESPIRATORY_TRACT
  Filled 2022-03-01: qty 7

## 2022-03-01 MED ORDER — ORAL CARE MOUTH RINSE: 15.0000 mL | Freq: Once | OROMUCOSAL | Status: AC

## 2022-03-01 MED ORDER — LIDOCAINE 2% (20 MG/ML) 5 ML SYRINGE
INTRAMUSCULAR | Status: AC
  Filled 2022-03-01: qty 10

## 2022-03-01 MED ORDER — TRAMADOL HCL 50 MG PO TABS
50.0000 mg | ORAL_TABLET | Freq: Four times a day (QID) | ORAL | Status: DC | PRN
  Administered 2022-03-02: 100 mg via ORAL
  Filled 2022-03-01: qty 2

## 2022-03-01 MED ORDER — PROPOFOL 10 MG/ML IV BOLUS
INTRAVENOUS | Status: AC
  Filled 2022-03-01: qty 20

## 2022-03-01 MED ORDER — FENTANYL CITRATE (PF) 250 MCG/5ML IJ SOLN
INTRAMUSCULAR | Status: AC
  Filled 2022-03-01: qty 5

## 2022-03-01 MED ORDER — HYDROMORPHONE HCL 1 MG/ML IJ SOLN
INTRAMUSCULAR | Status: AC
  Filled 2022-03-01: qty 1

## 2022-03-01 MED ORDER — CARVEDILOL 3.125 MG PO TABS
3.1250 mg | ORAL_TABLET | Freq: Every morning | ORAL | Status: DC
  Administered 2022-03-02 – 2022-03-08 (×7): 3.125 mg via ORAL
  Filled 2022-03-01 (×7): qty 1

## 2022-03-01 MED ORDER — BUPROPION HCL ER (SR) 150 MG PO TB12: 150.0000 mg | ORAL_TABLET | Freq: Every day | ORAL | Status: DC

## 2022-03-01 MED ORDER — ESCITALOPRAM OXALATE 10 MG PO TABS: 20.0000 mg | ORAL_TABLET | Freq: Every evening | ORAL | Status: DC

## 2022-03-01 MED ORDER — OXYCODONE HCL 5 MG PO TABS: 5.0000 mg | ORAL_TABLET | ORAL | Status: DC | PRN

## 2022-03-01 MED ORDER — SENNOSIDES-DOCUSATE SODIUM 8.6-50 MG PO TABS
1.0000 | ORAL_TABLET | Freq: Every day | ORAL | Status: DC
  Administered 2022-03-04 – 2022-03-07 (×4): 1 via ORAL
  Filled 2022-03-01 (×5): qty 1

## 2022-03-01 MED ORDER — SODIUM CHLORIDE FLUSH 0.9 % IV SOLN
INTRAVENOUS | Status: DC | PRN
  Administered 2022-03-01: 90 mL

## 2022-03-01 MED ORDER — HYDROMORPHONE HCL 1 MG/ML IJ SOLN
INTRAMUSCULAR | Status: DC | PRN
  Administered 2022-03-01: 1 mg via INTRAVENOUS

## 2022-03-01 MED ORDER — BISACODYL 5 MG PO TBEC
10.0000 mg | DELAYED_RELEASE_TABLET | Freq: Every day | ORAL | Status: DC
  Administered 2022-03-07 – 2022-03-08 (×2): 10 mg via ORAL
  Filled 2022-03-01 (×6): qty 2

## 2022-03-01 SURGICAL SUPPLY — 107 items
ADH SKN CLS APL DERMABOND .7 (GAUZE/BANDAGES/DRESSINGS) ×1
APPLIER CLIP ROT 10 11.4 M/L (STAPLE)
APR CLP MED LRG 11.4X10 (STAPLE)
BAG SPEC RTRVL C125 8X14 (MISCELLANEOUS) ×1
BAG SPEC RTRVL C1550 15 (MISCELLANEOUS) ×1
BLADE CLIPPER SURG (BLADE) ×1 IMPLANT
BNDG COHESIVE 6X5 TAN STRL LF (GAUZE/BANDAGES/DRESSINGS) IMPLANT
CANISTER SUCT 3000ML PPV (MISCELLANEOUS) ×2 IMPLANT
CANNULA REDUC XI 12-8 STAPL (CANNULA) ×2
CANNULA REDUCER 12-8 DVNC XI (CANNULA) ×2 IMPLANT
CATH THORACIC 28FR (CATHETERS) IMPLANT
CLIP APPLIE ROT 10 11.4 M/L (STAPLE) IMPLANT
CLIP VESOCCLUDE MED 6/CT (CLIP) IMPLANT
CNTNR URN SCR LID CUP LEK RST (MISCELLANEOUS) ×5 IMPLANT
CONN ST 1/4X3/8  BEN (MISCELLANEOUS) ×5
CONN ST 1/4X3/8 BEN (MISCELLANEOUS) IMPLANT
CONN Y 3/8X3/8X3/8  BEN (MISCELLANEOUS) ×1
CONN Y 3/8X3/8X3/8 BEN (MISCELLANEOUS) IMPLANT
CONT SPEC 4OZ STRL OR WHT (MISCELLANEOUS) ×15
DEFOGGER SCOPE WARMER CLEARIFY (MISCELLANEOUS) ×1 IMPLANT
DERMABOND ADVANCED .7 DNX12 (GAUZE/BANDAGES/DRESSINGS) ×1 IMPLANT
DRAIN CHANNEL 28F RND 3/8 FF (WOUND CARE) IMPLANT
DRAIN CHANNEL 32F RND 10.7 FF (WOUND CARE) IMPLANT
DRAPE ARM DVNC X/XI (DISPOSABLE) ×4 IMPLANT
DRAPE COLUMN DVNC XI (DISPOSABLE) ×1 IMPLANT
DRAPE CV SPLIT W-CLR ANES SCRN (DRAPES) ×1 IMPLANT
DRAPE DA VINCI XI ARM (DISPOSABLE) ×4
DRAPE DA VINCI XI COLUMN (DISPOSABLE) ×1
DRAPE HALF SHEET 40X57 (DRAPES) ×1 IMPLANT
DRAPE INCISE IOBAN 66X45 STRL (DRAPES) IMPLANT
DRAPE ORTHO SPLIT 77X108 STRL (DRAPES) ×1
DRAPE SURG ORHT 6 SPLT 77X108 (DRAPES) ×1 IMPLANT
ELECT BLADE 6.5 EXT (BLADE) IMPLANT
ELECT REM PT RETURN 9FT ADLT (ELECTROSURGICAL) ×1
ELECTRODE REM PT RTRN 9FT ADLT (ELECTROSURGICAL) ×1 IMPLANT
GAUZE KITTNER 4X5 RF (MISCELLANEOUS) ×2 IMPLANT
GAUZE SPONGE 4X4 12PLY STRL (GAUZE/BANDAGES/DRESSINGS) ×1 IMPLANT
GLOVE SS BIOGEL STRL SZ 7.5 (GLOVE) ×1 IMPLANT
GOWN STRL REUS W/ TWL LRG LVL3 (GOWN DISPOSABLE) ×2 IMPLANT
GOWN STRL REUS W/ TWL XL LVL3 (GOWN DISPOSABLE) ×2 IMPLANT
GOWN STRL REUS W/TWL 2XL LVL3 (GOWN DISPOSABLE) ×1 IMPLANT
GOWN STRL REUS W/TWL LRG LVL3 (GOWN DISPOSABLE) ×2
GOWN STRL REUS W/TWL XL LVL3 (GOWN DISPOSABLE) ×2
HEMOSTAT SURGICEL 2X14 (HEMOSTASIS) ×3 IMPLANT
IRRIGATION STRYKERFLOW (MISCELLANEOUS) ×1 IMPLANT
IRRIGATOR STRYKERFLOW (MISCELLANEOUS) ×1
KIT BASIN OR (CUSTOM PROCEDURE TRAY) ×1 IMPLANT
KIT SUCTION CATH 14FR (SUCTIONS) IMPLANT
KIT TURNOVER KIT B (KITS) ×1 IMPLANT
NDL HYPO 25GX1X1/2 BEV (NEEDLE) ×1 IMPLANT
NDL SPNL 22GX3.5 QUINCKE BK (NEEDLE) ×1 IMPLANT
NEEDLE HYPO 25GX1X1/2 BEV (NEEDLE) ×1 IMPLANT
NEEDLE SPNL 22GX3.5 QUINCKE BK (NEEDLE) ×1 IMPLANT
NS IRRIG 1000ML POUR BTL (IV SOLUTION) ×1 IMPLANT
PACK CHEST (CUSTOM PROCEDURE TRAY) ×1 IMPLANT
PAD ARMBOARD 7.5X6 YLW CONV (MISCELLANEOUS) ×2 IMPLANT
PROGEL SPRAY TIP 11IN (MISCELLANEOUS) ×1
RELOAD STAPLE 45 2.5 WHT DVNC (STAPLE) IMPLANT
RELOAD STAPLE 45 3.5 BLU DVNC (STAPLE) IMPLANT
RELOAD STAPLE 45 4.3 GRN DVNC (STAPLE) IMPLANT
RELOAD STAPLER 2.5X45 WHT DVNC (STAPLE) ×3 IMPLANT
RELOAD STAPLER 3.5X45 BLU DVNC (STAPLE) ×13 IMPLANT
RELOAD STAPLER 4.3X45 GRN DVNC (STAPLE) ×6 IMPLANT
SCISSORS LAP 5X35 DISP (ENDOMECHANICALS) IMPLANT
SEAL CANN UNIV 5-8 DVNC XI (MISCELLANEOUS) ×2 IMPLANT
SEAL XI 5MM-8MM UNIVERSAL (MISCELLANEOUS) ×2
SEALANT PROGEL (MISCELLANEOUS) IMPLANT
SET TRI-LUMEN FLTR TB AIRSEAL (TUBING) ×1 IMPLANT
SOLUTION ELECTROLUBE (MISCELLANEOUS) ×1 IMPLANT
SPONGE INTESTINAL PEANUT (DISPOSABLE) IMPLANT
SPONGE TONSIL TAPE 1 RFD (DISPOSABLE) IMPLANT
STAPLER 45 SUREFORM CVD (STAPLE) ×2
STAPLER 45 SUREFORM CVD DVNC (STAPLE) IMPLANT
STAPLER CANNULA SEAL DVNC XI (STAPLE) ×2 IMPLANT
STAPLER CANNULA SEAL XI (STAPLE)
STAPLER RELOAD 2.5X45 WHITE (STAPLE) ×3
STAPLER RELOAD 2.5X45 WHT DVNC (STAPLE) ×3
STAPLER RELOAD 3.5X45 BLU DVNC (STAPLE) ×13
STAPLER RELOAD 3.5X45 BLUE (STAPLE) ×13
STAPLER RELOAD 4.3X45 GREEN (STAPLE) ×6
STAPLER RELOAD 4.3X45 GRN DVNC (STAPLE) ×6
SUT PDS AB 3-0 SH 27 (SUTURE) IMPLANT
SUT PROLENE 4 0 RB 1 (SUTURE)
SUT PROLENE 4-0 RB1 .5 CRCL 36 (SUTURE) IMPLANT
SUT SILK  1 MH (SUTURE) ×2
SUT SILK 1 MH (SUTURE) ×2 IMPLANT
SUT SILK 2 0 SH (SUTURE) IMPLANT
SUT SILK 2 0SH CR/8 30 (SUTURE) IMPLANT
SUT SILK 3 0SH CR/8 30 (SUTURE) IMPLANT
SUT VIC AB 1 CTX 36 (SUTURE) ×1
SUT VIC AB 1 CTX36XBRD ANBCTR (SUTURE) IMPLANT
SUT VIC AB 2-0 CTX 36 (SUTURE) IMPLANT
SUT VIC AB 3-0 X1 27 (SUTURE) ×2 IMPLANT
SUT VICRYL 0 TIES 12 18 (SUTURE) ×1 IMPLANT
SUT VICRYL 0 UR6 27IN ABS (SUTURE) ×2 IMPLANT
SYR 20ML LL LF (SYRINGE) ×2 IMPLANT
SYSTEM RETRIEVAL ANCHOR 15 (MISCELLANEOUS) IMPLANT
SYSTEM RETRIEVAL ANCHOR 8 (MISCELLANEOUS) IMPLANT
SYSTEM SAHARA CHEST DRAIN ATS (WOUND CARE) ×1 IMPLANT
TAPE CLOTH 4X10 WHT NS (GAUZE/BANDAGES/DRESSINGS) ×1 IMPLANT
TAPE CLOTH SURG 4X10 WHT LF (GAUZE/BANDAGES/DRESSINGS) IMPLANT
TIP APPLICATOR SPRAY EXTEND 16 (VASCULAR PRODUCTS) IMPLANT
TIP SPRAY PROGEL 11IN (MISCELLANEOUS) IMPLANT
TOWEL GREEN STERILE (TOWEL DISPOSABLE) ×2 IMPLANT
TRAY FOLEY MTR SLVR 16FR STAT (SET/KITS/TRAYS/PACK) ×1 IMPLANT
TRAY WAYNE PNEUMOTHORAX 14X18 (TRAY / TRAY PROCEDURE) IMPLANT
WATER STERILE IRR 1000ML POUR (IV SOLUTION) ×1 IMPLANT

## 2022-03-01 NOTE — Anesthesia Procedure Notes (Signed)
Arterial Line Insertion Performed by: Katerin Negrete B, CRNA, CRNA  Patient location: Pre-op. Preanesthetic checklist: patient identified, IV checked, site marked, risks and benefits discussed, surgical consent, monitors and equipment checked, pre-op evaluation, timeout performed and anesthesia consent Lidocaine 1% used for infiltration and patient sedated Left, radial was placed Catheter size: 20 G Hand hygiene performed , maximum sterile barriers used  and Seldinger technique used Allen's test indicative of satisfactory collateral circulation Attempts: 1 Procedure performed without using ultrasound guided technique. Following insertion, dressing applied and Biopatch. Post procedure assessment: normal  Patient tolerated the procedure well with no immediate complications.    

## 2022-03-01 NOTE — Transfer of Care (Signed)
Immediate Anesthesia Transfer of Care Note  Patient: Andrea Morgan  Procedure(s) Performed: XI ROBOTIC ASSISTED THORACOSCOPY- RIGHT UPPER LOBE WEDGE RESECTION AND LOBECTOMY (Right: Chest) INTERCOSTAL NERVE BLOCK (Right: Chest) NODE DISSECTION (Right: Chest)  Patient Location: PACU  Anesthesia Type:General  Level of Consciousness: drowsy, patient cooperative, and responds to stimulation  Airway & Oxygen Therapy: Patient Spontanous Breathing  Post-op Assessment: Report given to RN and Post -op Vital signs reviewed and stable  Post vital signs: Reviewed and stable  Last Vitals:  Vitals Value Taken Time  BP 101/84 03/01/22 1407  Temp    Pulse 70 03/01/22 1411  Resp 16 03/01/22 1411  SpO2 94 % 03/01/22 1411  Vitals shown include unvalidated device data.  Last Pain:  Vitals:   03/01/22 0758  TempSrc:   PainSc: 0-No pain      Patients Stated Pain Goal: 0 (16/10/96 0454)  Complications:  Encounter Notable Events  Notable Event Outcome Phase Comment  Difficult to intubate - expected  Intraprocedure Filed from anesthesia note documentation.

## 2022-03-01 NOTE — Anesthesia Postprocedure Evaluation (Signed)
Anesthesia Post Note  Patient: Andrea Morgan  Procedure(s) Performed: XI ROBOTIC ASSISTED THORACOSCOPY- RIGHT UPPER LOBE WEDGE RESECTION AND LOBECTOMY (Right: Chest) INTERCOSTAL NERVE BLOCK (Right: Chest) NODE DISSECTION (Right: Chest)     Patient location during evaluation: PACU Anesthesia Type: General Level of consciousness: awake and alert Pain management: pain level controlled Vital Signs Assessment: post-procedure vital signs reviewed and stable Respiratory status: spontaneous breathing, nonlabored ventilation, respiratory function stable and patient connected to nasal cannula oxygen Cardiovascular status: blood pressure returned to baseline and stable Postop Assessment: no apparent nausea or vomiting Anesthetic complications: yes  Encounter Notable Events  Notable Event Outcome Phase Comment  Difficult to intubate - expected  Intraprocedure Filed from anesthesia note documentation.    Last Vitals:  Vitals:   03/01/22 1445 03/01/22 1500  BP: 102/68 (!) 117/55  Pulse: 63 63  Resp: 11 14  Temp:    SpO2: 97% 100%    Last Pain:  Vitals:   03/01/22 1430  TempSrc:   PainSc: 0-No pain                 Chenelle Benning,W. EDMOND

## 2022-03-01 NOTE — Interval H&P Note (Signed)
History and Physical Interval Note:  03/01/2022 8:58 AM  Andrea Morgan  has presented today for surgery, with the diagnosis of Right upper lobe lung nodule.  The various methods of treatment have been discussed with the patient and family. After consideration of risks, benefits and other options for treatment, the patient has consented to  Procedure(s): XI ROBOTIC [place], POSSIBLE THORACOTOMY, POSSIBLE LOBECTOMY (Right) as a surgical intervention.  The patient's history has been reviewed, patient examined, no change in status, stable for surgery.  I have reviewed the patient's chart and labs.  Questions were answered to the patient's satisfaction.     Melrose Nakayama

## 2022-03-01 NOTE — Brief Op Note (Addendum)
03/01/2022  1:49 PM  PATIENT:  Andrea Morgan  71 y.o. 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Dragon Dictation  PLAN OF CARE: Admit to inpatient   PATIENT DISPOSITION:  PACU - hemodynamically stable.   Delay start of Pharmacological VTE agent (>24hrs) due to surgical blood loss or risk of bleeding: yes

## 2022-03-01 NOTE — Anesthesia Procedure Notes (Signed)
Procedure Name: Intubation Date/Time: 03/01/2022 9:42 AM  Performed by: Janace Litten, CRNAPre-anesthesia Checklist: Patient identified, Emergency Drugs available, Suction available and Patient being monitored Patient Re-evaluated:Patient Re-evaluated prior to induction Oxygen Delivery Method: Circle System Utilized Preoxygenation: Pre-oxygenation with 100% oxygen Induction Type: IV induction Ventilation: Mask ventilation without difficulty Laryngoscope Size: Glidescope and 3 Grade View: Grade I Tube type: Oral Endobronchial tube: Left, Double lumen EBT, EBT position confirmed by auscultation and EBT position confirmed by fiberoptic bronchoscope and 37 Fr Number of attempts: 1 Airway Equipment and Method: Stylet and Video-laryngoscopy Placement Confirmation: ETT inserted through vocal cords under direct vision, positive ETCO2 and breath sounds checked- equal and bilateral Tube secured with: Tape Dental Injury: Teeth and Oropharynx as per pre-operative assessment  Difficulty Due To: Difficulty was anticipated, Difficult Airway- due to anterior larynx and Difficult Airway- due to dentition Comments: DLx1 with MAC 3 - grade III+ view; VLx1 with glidescope - grade I view.

## 2022-03-01 NOTE — Hospital Course (Addendum)
History of Present Illness:  Andrea Morgan is a 71 year old woman with a history of remote tobacco abuse, COPD, solitary fibrous tumor of the pleura, hyper lipidemia, hypothyroidism, anemia, degenerative disc disease, hiatal hernia with reflux, anxiety, and depression.  She smoked about a pack a day for 45 years prior to quitting in 2013. In 2013 she had a right thoracotomy for resection of a solitary fibrous tumor of the pleura by Dr. Servando Snare.  She did well with that procedure without any major complications.  She had a low-dose CT for lung cancer screening in December 2022.  It showed multiple pulmonary nodules in the setting of emphysema.  The most suspicious nodules in the periphery of the right upper lobe.  A follow-up scan in August showed that nodule had increased in size slightly.  A PET/CT was done which showed the nodule was mildly hypermetabolic.  There was no evidence of regional or distant metastatic disease.  She gets short of breath with exertion but can walk up a flight of stairs without stopping.  She has a chronic cough but that is better with the new inhaler that Dr. Lamonte Sakai prescribed.  She does have a hiatal hernia and reflux with frequent symptoms.  No exertional chest pain, pressure, or tightness.  She was evaluated by Dr. Roxan Hockey who felt the patient would require surgical resection  This would be done via a Robotic Approach however with her previous intervention Thoracotomy could be required for safety purposes.  This risks and benefits of the procedure were explained to the patient and she was agreeable to proceed.  Hospital Course:  Andrea Morgan presented to [facility] 03/01/2022.  She was taken to the operating room and underwent Robotic Assisted Video Thoracoscopy with right Upper Lobectomy, lymph node dissection, intercostal nerve block.  She tolerated the procedure without difficulty, was extubated, and taken to the SICU in stable condition.  The patient's chest  tube had tidaling and an air leak.  CXR showed a trace apical space.  She was hypertensive and her home regimen of medications were resumed.  She is unable to take oxycodone and was transitioned to Hydrodcone.  She was taken off IV fluids.  The patient's chest tube was removed.  Her pigtail was left in place on water seal.  Follow up CXR showed stable apical space.  The patient was noted to have moderate malnutrition.  Dietary consult was obtained and made dietary adjustments.  Her air leak resolved.  Her chest xray showed stable appearance of apical pneumothorax and sub cutaneous emphysema.  Her chest tube was removed without difficulty on 03/05/2022.  Follow up CXR showed right hydropneumothorax is again noted with mild improvement in the apical pneumo component and slightly increased layering hydro component inferiorly and .Extensive right chest wall and neck base emphysema  Patient initially did desat at night so she was put on 2L of oxygen via [place]. Recent documentation shows she might not meet criteria for home oxygen. CXR on 11/19 showed  right apical pneumothorax appears grossly stable, there has been some reaccumulation of pleural fluid, and persistent severe subcutaneous emphysema. CXR on 11/20 showed stable hydropneumothorax and sub q emphysema which was stable . Her surgical incisions are healing without evidence of infection.  She is ambulating without difficulty.  Home health has been arranged.  She is medically stable for discharge home today.

## 2022-03-02 ENCOUNTER — Encounter (HOSPITAL_COMMUNITY): Payer: Self-pay | Admitting: Thoracic Surgery (Cardiothoracic Vascular Surgery)

## 2022-03-02 ENCOUNTER — Inpatient Hospital Stay (HOSPITAL_COMMUNITY): Payer: Medicare Other

## 2022-03-02 LAB — CBC
HCT: 33.9 % — ABNORMAL LOW (ref 36.0–46.0)
Hemoglobin: 11 g/dL — ABNORMAL LOW (ref 12.0–15.0)
MCH: 27.8 pg (ref 26.0–34.0)
MCHC: 32.4 g/dL (ref 30.0–36.0)
MCV: 85.6 fL (ref 80.0–100.0)
Platelets: 237 10*3/uL (ref 150–400)
RBC: 3.96 MIL/uL (ref 3.87–5.11)
RDW: 18.3 % — ABNORMAL HIGH (ref 11.5–15.5)
WBC: 15.5 10*3/uL — ABNORMAL HIGH (ref 4.0–10.5)
nRBC: 0 % (ref 0.0–0.2)

## 2022-03-02 LAB — BASIC METABOLIC PANEL
Anion gap: 9 (ref 5–15)
BUN: 7 mg/dL — ABNORMAL LOW (ref 8–23)
CO2: 23 mmol/L (ref 22–32)
Calcium: 8 mg/dL — ABNORMAL LOW (ref 8.9–10.3)
Chloride: 105 mmol/L (ref 98–111)
Creatinine, Ser: 0.75 mg/dL (ref 0.44–1.00)
GFR, Estimated: >60 mL/min (ref >60)
Glucose, Bld: 145 mg/dL — ABNORMAL HIGH (ref 70–99)
Potassium: 4.1 mmol/L (ref 3.5–5.1)
Sodium: 137 mmol/L (ref 135–145)

## 2022-03-02 MED ORDER — ALBUTEROL SULFATE (2.5 MG/3ML) 0.083% IN NEBU
2.5000 mg | INHALATION_SOLUTION | Freq: Three times a day (TID) | RESPIRATORY_TRACT | Status: DC
  Administered 2022-03-02 – 2022-03-05 (×9): 2.5 mg via RESPIRATORY_TRACT
  Filled 2022-03-02 (×9): qty 3

## 2022-03-02 MED ORDER — HYDROCODONE-ACETAMINOPHEN 7.5-325 MG PO TABS
1.0000 | ORAL_TABLET | ORAL | Status: DC | PRN
  Administered 2022-03-02 – 2022-03-08 (×14): 1 via ORAL
  Filled 2022-03-02 (×14): qty 1

## 2022-03-02 NOTE — Progress Notes (Signed)
Chest tube dressing saturated with serosanguinous  drainage. Dressing changed with xeroform and sponge gauze.tolerated well. Continue to monitor.

## 2022-03-02 NOTE — Progress Notes (Signed)
Mobility Specialist Progress Note    03/02/22 1530  Mobility  Activity Ambulated with assistance in hallway  Level of Assistance Standby assist, set-up cues, supervision of patient - no hands on  Assistive Device Front wheel walker  Distance Ambulated (ft) 420 ft  Activity Response Tolerated well  Mobility Referral Yes  $Mobility charge 1 Mobility   Pre-Mobility: 72 HR, 92% SpO2 During Mobility: 85 HR, >/=90% SpO2 Post-Mobility: 65 HR, 152/35 (70) BP, 94% SpO2  Pt received in bed and agreeable. No complaints on walk. Returned to bed with call bell in reach.    Hildred Alamin Mobility Specialist  Please Psychologist, sport and exercise or Rehab Office at [phone]

## 2022-03-02 NOTE — Progress Notes (Signed)
Mobility Specialist Progress Note    03/02/22 0941  Mobility  Activity Ambulated with assistance in hallway  Level of Assistance Standby assist, set-up cues, supervision of patient - no hands on  Assistive Device Front wheel walker  Distance Ambulated (ft) 340 ft  Activity Response Tolerated well  Mobility Referral Yes  $Mobility charge 1 Mobility   Pre-Mobility: 70 HR, 160/76 (99) BP Post-Mobility: 77 HR, 177/68 (99) BP, 95% SpO2 on RA  Pt received in bed and agreeable. No complaints on walk. Tolerated on RA. Returned to bed with call bell in reach. Left on RA. RN notified.   Hildred Alamin Mobility Specialist  Please Psychologist, sport and exercise or Rehab Office at [phone]

## 2022-03-02 NOTE — Progress Notes (Addendum)
      [address]       [place] [address]             [phone]      1 Day Post-Op Procedure(s) (LRB): XI ROBOTIC ASSISTED THORACOSCOPY- RIGHT UPPER LOBE WEDGE RESECTION AND LOBECTOMY (Right) INTERCOSTAL NERVE BLOCK (Right) NODE DISSECTION (Right)  Subjective:  Patient doing well.  She states her pain is well controlled.  She states she was given oxycodone and she can't take it.  She states Hydrocodone works for her.  Objective: Vital signs in last 24 hours: Temp:  [97.4 F (36.3 C)-98.5 F (36.9 C)] 98.5 F (36.9 C) (11/14 0740) Pulse Rate:  [59-74] 65 (11/14 0740) Cardiac Rhythm: Normal sinus rhythm (11/14 0700) Resp:  [9-22] 20 (11/14 0740) BP: (101-140)/(47-106) 140/88 (11/14 0740) SpO2:  [90 %-100 %] 98 % (11/14 0740) Arterial Line BP: (111-146)/(41-60) 111/41 (11/13 1545)  Intake/Output from previous day: 11/13 0701 - 11/14 0700 In: 2690 [P.O.:240; I.V.:2200] Out: 3195 [Urine:2645; Blood:150; Chest Tube:400]  General appearance: alert, cooperative, and no distress Heart: regular rate and rhythm Lungs: clear to auscultation bilaterally Abdomen: soft, non-tender; bowel sounds normal; no masses,  no organomegaly Extremities: extremities normal, atraumatic, no cyanosis or edema Wound: clean, serous drainage from around chest tube  Lab Results: Recent Labs    03/02/22 0019  WBC 15.5*  HGB 11.0*  HCT 33.9*  PLT 237   BMET:  Recent Labs    03/02/22 0019  NA 137  K 4.1  CL 105  CO2 23  GLUCOSE 145*  BUN 7*  CREATININE 0.75  CALCIUM 8.0*    PT/INR: No results for input(s): "LABPROT", "INR" in the last 72 hours. ABG    Component Value Date/Time   PHART 7.46 (H) 02/26/2022 1012   HCO3 25.6 02/26/2022 1012   TCO2 22 11/05/2012 1358   O2SAT 98.2 02/26/2022 1012   CBG (last 3)  No results for input(s): "GLUCAP" in the last 72 hours.  Assessment/Plan: S/P Procedure(s) (LRB): XI ROBOTIC ASSISTED THORACOSCOPY- RIGHT UPPER LOBE  WEDGE RESECTION AND LOBECTOMY (Right) INTERCOSTAL NERVE BLOCK (Right) NODE DISSECTION (Right)  CV- NSR, HTN- continue home Coreg, Cardizem Pulm- CT on water seal, + air leak, CXR with trace apical space- leave chest tube in place today Renal-creatinine, lytes are normal.. stop IV fluids, okay to continue Toradol for pain control Pain control- patient states she can't take Oxy, due to itching.. prefers Hydrocodone which has been ordered Lovenox for DVT prophylaxis Repeat CXR in AM   LOS: 1 day    Ellwood Handler, PA-C 03/02/2022  Patient seen and examined, agree with A/P as outlined above Ambulate   Remo Lipps C. Roxan Hockey, MD [facility] [phone]

## 2022-03-02 NOTE — Op Note (Signed)
Andrea Morgan, Andrea Morgan MEDICAL RECORD NO: [id number] ACCOUNT NO: [id number] DATE OF BIRTH: 1950-09-22 FACILITY: [facility] LOCATION: [facility] PHYSICIAN: Revonda Standard. Roxan Hockey, MD  Operative Report   DATE OF PROCEDURE: 03/01/2022  PREOPERATIVE DIAGNOSIS:  Right upper lobe lung nodule.  POSTOPERATIVE DIAGNOSIS:  Non-small cell carcinoma, right upper lobe, clinical stage IB (T2a, N0).  PROCEDURE PERFORMED:   Xi robotic-assisted right upper lobe wedge resection,  Right upper lobectomy,  Lymph node dissection,  Intercostal nerve blocks levels 3 through 10.  SURGEON:  Revonda Standard. Roxan Hockey, MD  ASSISTANT:  Ellwood Handler, PA  ANESTHESIA:  General.  FINDINGS:  Adhesions from previous surgery. Nodule clearly visible in lateral aspect of upper lobe with invagination of visceral pleura.  Severe emphysematous changes in right upper lobe.  Enlarged, but otherwise benign-appearing lymph nodes.  Frozen section showed non-small cell carcinoma.  CLINICAL NOTE:  Andrea Morgan is a 71 year old woman with a history of tobacco abuse and resection of a solitary fibrous tumor of the pleura in 2013.  She had a low-dose CT for lung cancer screening, which showed multiple pulmonary nodules.  On a  followup CT, a dominant nodule in the periphery of the right upper lobe was enlarged.  On PET/CT, it was mildly hypermetabolic which was significant given its relatively small size.  She was advised to undergo surgical resection with plan for wedge  resection versus lobectomy depending on intraoperative findings.  The indications, risks, benefits, and alternatives were discussed in detail with the patient.  She understood and accepted the risks and agreed to proceed.  OPERATIVE NOTE:  Andrea Morgan was brought to the operating room on 03/01/2022.  In the preoperative holding area, she had had an arterial blood pressure monitoring line placed and intravenous access established by anesthesia. In the operating room, she was   anesthetized and intubated with a double lumen endotracheal tube.  Intravenous antibiotics were administered.  A Foley catheter was placed.  Sequential compression devices were placed on the calves for DVT prophylaxis.  She was placed in a left lateral  decubitus position.  A Bair Hugger was placed for active warming.  The right chest was prepped and draped in the usual sterile fashion and single lung ventilation of the left lung was initiated.  The patient tolerated that well throughout the procedure.  A timeout was performed.  A solution containing 20 mL of liposomal bupivacaine, 30 mL of 0.5% bupivacaine and 50 mL of saline was prepared.  This solution was used for local at the incision sites as well as for the intercostal nerve blocks.  An incision was made  in the eighth interspace in the posterolateral right chest.  The chest was bluntly entered using a hemostat.  Robotic port was inserted and the thoracoscope was advanced into the chest.  There was a free space where the cannula was present, but there  were adhesions, particularly of the middle lobe to the previous incision. A 12 mm robotic port was placed in the eighth interspace anteriorly.  A 12 mm AirSeal port was placed in the tenth interspace anteriorly, an 8 mm robotic port was placed in  approximately the midaxillary line for the camera port.  Intercostal nerve blocks were performed from the third to the tenth interspace injecting 10 mL of the bupivacaine solution into a subpleural plane at each level.  An 8 mm robotic port was placed  in the eighth interspace posteriorly.  The robotic instruments were inserted with thoracoscopic visualization.  Bipolar cautery was used to  take down the adhesions.  In some areas of these adhesions were vascular and in some areas, the lung was densely adherent to the chest wall.  These areas did have some small pleural tears, which were later stapled using the  robotic stapler.  After freeing up the  adhesions, the nodule was clearly visible in the lateral aspect of the right upper lobe.  The upper lobe was very emphysematous with a large bulla and then multiple blebs.  A wedge resection was performed using the  robotic stapler and the specimen was removed and sent for frozen section.  It was noted that there was significant visceral pleural invagination.  The lymph node dissection was initiated while awaiting the results of the frozen section. The inferior  ligament was divided, but no level 9 node was identified.  The pleural reflection was divided at the hilum posteriorly and large but otherwise benign-appearing level 8 and 7 lymph nodes were removed.  The patient did have relatively friable tissues and  they bled easily.  The pleural reflection then was divided up over the bronchus. The level 11 node was not dissected out at this time due to concerns for potential air leaks.  There were additional adhesions to the apex into the mediastinum.  These were  taken down with bipolar cautery. Pleural reflection was divided between the azygos and the hilum and level 10 node was removed.  Working above the azygos vein, the pleura was incised and multiple level 4R nodes were removed.  There was some mild bleeding with removal of both the 4R and level 7  subcarinal nodes and Surgicel was packed into these areas.  It was later removed with good hemostasis present.  Pleural reflection then was divided anteriorly.  By this point, the frozen section returned showing a non-small cell carcinoma.  Given the degree of  emphysema in the lung and the potential visceral pleural involvement, which would make it a stage IB lesion, the decision was made to proceed with lobectomy.  The pulmonary artery was identified in the fissure.  A plane was developed adjacent to the  pulmonary artery and the major fissure was completed between the superior segment and the right upper lobe with sequential firings of the robotic stapler.   Level 11 node was removed at this point in time. The posterior ascending branch of the upper lobe  was clearly visible and it was encircled and divided with the robotic stapler.  Inspection anteriorly, identified the middle lobe pulmonary vein.  There were some adhesions in this area between the middle lobe and the lower lobe, which were taken down  and areas were stapled where there had been some pleural tears with the lysis of adhesions.  The minor fissure then was completed, the plane was developed over the pulmonary artery, working anteriorly and multiple firings of the robotic stapler were used  to complete the major fissure as well.  There was a large node between the upper lobe and middle lobe branches of the superior pulmonary vein, this node was removed.  The upper lobe branches then were encircled and divided with the robotic stapler.   There was also an enlarged node between the anterior apical trunk of the pulmonary artery and the right upper lobe bronchus, which was removed.  The anterior apical trunk then was divided.  The stapler was placed across the right upper lobe bronchus at  its origin and closed.  A test inflation showed aeration in the middle and lower lobes.  Stapler then was fired transecting the bronchus and the final stapling of the minor fissure anteriorly was completed, completing the lobectomy.  The chest was  copiously irrigated with saline.  A test inflation to 30 cm water pressure revealed some leakage from pleural tear in the fissure.  This area was stapled and Progel was later applied to that area. The middle lobe was tacked to the lower lobe using the robotic  stapler.  The vessel loop and sponges that had been placed during the dissection were removed.  The AirSeal port was removed.  A 15 mm endoscopic retrieval bag was placed into the chest and the upper lobe was manipulated into the retrieval bag, which was  then brought down to the incision.  The robotic instruments  were removed.  The robot was undocked.  The anterior eighth interspace incision was lengthened to approximately 3 cm and the upper lobe was removed through that incision and sent for permanent  pathology.  Final inspection was made for hemostasis.  A 14-French pigtail catheter was placed anteriorly and directed to the apex and then a 28-French Blake drain was placed through the tenth interspace incision and directed to the apex.  Both were  secured with #1 silk sutures.  Dual lung ventilation was resumed.  The incisions were closed in standard fashion.  Dermabond was applied.  The chest tube was placed to a Pleur-Evac on waterseal.  The patient was placed back in a supine position.  She  then was extubated in the operating room and taken to the [facility] in good condition.  All sponge, needle and instrument counts were correct at the end of the procedure.  The patient was taken from the operating room to the [facility], extubated and in good condition.  Experienced assistance was necessary for this case due to surgical complexity.  Erin Barrett served as the Environmental consultant providing assistance with port placement, robot docking and undocking, instrument exchange, specimen retrieval, and wound closure.       PAA D: 03/01/2022 5:12:43 pm T: 03/02/2022 1:12:00 am  JOB: [id number]/ [id number]

## 2022-03-02 NOTE — Discharge Summary (Addendum)
Physician Discharge Summary  Patient ID: Andrea Morgan MRN: [id number] DOB/AGE: 06/01/1950 71 y.o.  Admit date: 03/01/2022 Discharge date: 03/08/2022  Admission Diagnoses:Lung nodule, suspected clinical stage IA (T1,N0) non-small cell carcinoma  Patient Active Problem List   Diagnosis Date Noted   Malnutrition of moderate degree 03/03/2022   Lesion of lung 03/01/2022   Pulmonary nodule 12/22/2021   Thoracic disc herniation 01/13/2016   Chest pain 08/28/2014   Chronic headaches    Anxiety    Cephalalgia    Hypercholesterolemia 03/11/2014   Atrial fibrillation with RVR - post-operative  11/12/2013   Personal history of colonic polyps 11/08/2013   PAF (paroxysmal atrial fibrillation) ([place]) 10/17/2013   COPD (chronic obstructive pulmonary disease) ([place]) 04/26/2013   Adrenal adenoma 04/26/2013   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   Chest mass 05/04/2011   Discharge Diagnoses: 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,N1)   Patient Active Problem List   Diagnosis Date Noted   Adenocarcinoma of lung, right ([place]) 03/05/2022   Malnutrition of moderate degree 03/03/2022   Lesion of lung 03/01/2022   S/P Robotic Assisted Video Thoracosocpy with Right Upper Lobectomy 03/01/2022   Pulmonary nodule 12/22/2021   Thoracic disc herniation 01/13/2016   Chest pain 08/28/2014   Chronic headaches    Anxiety    Cephalalgia    Hypercholesterolemia 03/11/2014   Atrial fibrillation with RVR - post-operative  11/12/2013   Personal history of colonic polyps 11/08/2013   PAF (paroxysmal atrial fibrillation) ([place]) 10/17/2013   COPD (chronic obstructive pulmonary disease) ([place]) 04/26/2013   Adrenal adenoma 04/26/2013   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  She smoked about a pack a day for 45 years prior to quitting in 2013. In 2013 she had a right thoracotomy for resection of a solitary fibrous tumor of the pleura by Dr. Servando Snare.  She did well with that procedure without any major complications.  She had a low-dose CT for lung cancer screening in December 2022.  It showed multiple pulmonary nodules in the setting of emphysema.  The most suspicious nodules in the periphery of the right upper lobe.  A follow-up scan in August showed that nodule had increased in size slightly.  A PET/CT was done which showed the nodule was mildly hypermetabolic.  There was no evidence of regional or distant metastatic disease.  She gets short of breath with exertion but can walk up a flight of stairs without stopping.  She has a chronic cough but that is better with the new inhaler that Dr. Lamonte Sakai prescribed.  She does have a hiatal hernia and reflux with frequent symptoms.  No exertional chest pain, pressure, or tightness.  She was evaluated by Dr. Roxan Hockey who felt the patient would require surgical resection  This would be done via a Robotic Approach however with her previous intervention Thoracotomy could be required for safety purposes.  This risks and benefits of the procedure were explained to the patient and she was agreeable to proceed.  Hospital Course:  Andrea Morgan presented to [facility] 03/01/2022.  She was taken to the operating room and underwent Robotic Assisted Video Thoracoscopy with right Upper Lobectomy, lymph node dissection, intercostal nerve block.  She tolerated the procedure without difficulty, was extubated, and taken to the SICU in stable condition.  The patient's chest tube had tidaling and an air leak.  CXR showed a trace apical space.  She was hypertensive and her home regimen of medications were resumed.  She is  unable to take oxycodone and was transitioned to Hydrodcone.  She was taken off IV fluids.  The patient's chest tube was removed.  Her pigtail was left in place on water seal.  Follow up CXR showed stable apical space.  The patient was noted to have moderate malnutrition.  Dietary consult was obtained and made dietary adjustments.  Her air leak resolved.  Her chest xray showed stable appearance of apical pneumothorax and sub cutaneous emphysema.  Her chest tube was removed without difficulty on 03/05/2022.  Follow up CXR showed right hydropneumothorax is again noted with mild improvement in the apical pneumo component and slightly increased layering hydro component inferiorly and .Extensive right chest wall and neck base emphysema  Patient initially did desat at night so she was put on 2L of oxygen via [place]. Recent documentation shows she might not meet criteria for home oxygen. CXR on 11/19 showed  right apical pneumothorax appears grossly stable, there has been some reaccumulation of pleural fluid, and persistent severe subcutaneous emphysema. CXR on 11/20 showed stable hydropneumothorax and sub q emphysema which was stable . Her surgical incisions are healing without evidence of infection.  She is ambulating without difficulty.  Home health has been arranged.  She is medically stable for discharge home today.  Consults: None  Significant Diagnostic Studies: nuclear medicine:   FINDINGS: Mediastinal blood pool activity: SUV max 1.6   Liver activity: SUV max NA   NECK: No hypermetabolic cervical adenopathy.   Homogeneous hypermetabolic activity in the thyroid gland with a max SUV of 6.6.   Incidental CT findings: None.   CHEST: Low-level metabolic activity in the spiculated peripheral right upper lobe pulmonary nodule measuring 7 mm on image 25/7 with a max SUV of 1.7.   No significant abnormal hypermetabolic activity in the additional scattered subcentimeter pulmonary nodules measuring up to  5 mm in the right upper lobe on image 22/7. However, all of these nodules are below the resolution of PET.   No hypermetabolic thoracic adenopathy.   Incidental CT findings: Centrilobular and paraseptal emphysema. Scattered bilateral pleuroparenchymal scarring. Aortic athero sclerosus. Coronary artery calcifications.   ABDOMEN/PELVIS: No abnormal hypermetabolic activity within the liver, pancreas, adrenal glands, or spleen. No hypermetabolic lymph nodes in the abdomen or pelvis.   Incidental CT findings: Hypodense 3.3 cm left adrenal nodule measures Hounsfield units of -7 with metabolic activity less than that of background liver, measuring a max SUV of 2.5 consistent with a benign adrenal adenoma requiring no independent imaging follow-up. Hypodense 3.7 cm lesion in the left renal sinus measures fluid density consistent with a cyst and considered benign requiring no independent imaging follow-up. Nonobstructive left renal stones measure up to 6 mm. Prior partial colectomy with anastomotic sutures in the right hemiabdomen. Colonic diverticulosis without findings of acute diverticulitis. Aortic atherosclerosis.   SKELETON: No focal hypermetabolic activity to suggest skeletal metastasis.   Incidental CT findings: None.   IMPRESSION: 1. Low-level metabolic activity in the spiculated right upper lobe pulmonary nodule is suspicious for primary pulmonary neoplasm, consider further evaluation with direct tissue sampling. 2. No hypermetabolic adenopathy or evidence of hypermetabolic metastatic disease in the chest, abdomen or pelvis. 3. No significant abnormal hypermetabolic activity in the  additional scattered subcentimeter pulmonary nodules all which are below the resolution of PET-CT, continued attention on follow-up imaging suggested. 4. Diffuse homogeneous hypermetabolic thyroid activity most commonly reflects thyroiditis suggest correlation with laboratory values. 5.  Aortic  Atherosclerosis (ICD10-I70.0).     Electronically Signed   By: Dahlia Bailiff M.D.   On: 12/08/2021 16:49  Treatments: surgery:   Operative Report    DATE OF PROCEDURE: 03/01/2022   PREOPERATIVE DIAGNOSIS:  Right upper lobe lung nodule.   POSTOPERATIVE DIAGNOSIS:  Non-small cell carcinoma, right upper lobe, clinical stage IB (T2a, N0).   PROCEDURE PERFORMED:  Xi robotic-assisted right upper lobe wedge resection, right upper lobectomy, lymph node dissection, intercostal nerve blocks levels 3 through 10.   SURGEON:  Revonda Standard. Roxan Hockey, MD   ASSISTANT:  Ellwood Handler, PA-C  PATHOLOGY:   SURGICAL PATHOLOGY CASE: [id number] PATIENT: Alease Medina Surgical Pathology Report  Clinical History: right upper lobe lung nodule (cm)  FINAL MICROSCOPIC DIAGNOSIS:  A. LUNG, RIGHT UPPER LOBE, WEDGE RESECTION: Adenocarcinoma, acinar predominant, 1.3 cm. Carcinoma involves visceral pleural connective tissue (PL1). Focal lymphovascular space involvement by tumor. See oncology table.  B. LYMPH NODE, LEVEL 8, EXCISION: Lymph node negative for metastatic carcinoma (0/1).  C. LYMPH NODE, LEVEL 7, EXCISION: Lymph node negative for metastatic carcinoma (0/1).  D. LYMPH NODE, LEVEL 7 #2, EXCISION: Lymph node negative for metastatic carcinoma (0/1).  E. LYMPH NODE, LEVEL 7 #3, EXCISION: Lymph node negative for metastatic carcinoma (0/1).  F. LYMPH NODE, LEVEL 10, EXCISION: Lymph node negative for metastatic carcinoma (0/1).  G. LYMPH NODE, LEVEL 4R, EXCISION: Lymph node negative for metastatic carcinoma (0/1).  H. LYMPH NODE, LEVEL 10 #2, EXCISION: Lymph node with metastatic adenocarcinoma (1/1).  I. LYMPH NODE, LEVEL 4R #2, EXCISION: Lymph node negative for metastatic carcinoma (0/1).  J. LYMPH NODE, LEVEL 4R #3, EXCISION: Lymph node negative for metastatic carcinoma (0/1).  K. LYMPH NODE, LEVEL 12, EXCISION: Lymph node negative for metastatic carcinoma (0/1).  L.  LYMPH NODE, LEVEL 11, EXCISION: Lymph node negative for metastatic carcinoma (0/1).  M. LYMPH NODE, LEVEL 12 #2, EXCISION: Lymph node negative for metastatic carcinoma (0/1).  N. LYMPH NODE, LEVEL 10 #3, EXCISION: Lymph node negative for metastatic carcinoma (0/1).  O. LUNG, RIGHT UPPER LOBE, LOBECTOMY: Adenocarcinoma, acinar predominant, 1.2 cm. Peribronchial lymph node negative for metastatic carcinoma (0/1). All surgical margins negative for carcinoma. Emphysematous changes. See oncology table.    Discharge Exam: Blood pressure 130/63, pulse 83, temperature 97.6 F (36.4 C), temperature source Oral, resp. rate 19, height 5\' 6"  (1.676 m), weight 68.5 kg, SpO2 94 %.  General appearance: alert, cooperative, and no distress Heart: regular rate and rhythm Lungs: clear to auscultation bilaterally Abdomen: soft, non-tender; bowel sounds normal; no masses,  no organomegaly Extremities: extremities normal, atraumatic, no cyanosis or edema Wound: clean and dry   Discharge disposition: 01-Home or Self Care   Allergies as of 03/08/2022       Reactions   Oxycontin [oxycodone] Itching   Sulfa Antibiotics Hives   Morphine And Related Itching        Medication List     TAKE these medications    albuterol 108 (90 Base) MCG/ACT inhaler Commonly known as: VENTOLIN HFA Inhale 2 puffs into the lungs every 6 (six) hours as needed for wheezing or shortness of breath.   alendronate 70 MG tablet Commonly known as: FOSAMAX Take 70 mg by mouth See admin instructions. 70 mg once a week on Thursday   ALPRAZolam  1 MG tablet Commonly known as: XANAX Take 1 mg by mouth 3 (three) times daily as needed for anxiety.   ASCORBIC ACID PO Take 1 tablet by mouth daily. Vitamin C, unknown strength.   aspirin EC 81 MG tablet Take 81 mg by mouth daily.   atorvastatin 20 MG tablet Commonly known as: LIPITOR Take 20 mg by mouth at bedtime.   buPROPion 150 MG 12 hr tablet Commonly known  as: WELLBUTRIN SR Take 150 mg by mouth daily.   carvedilol 3.125 MG tablet Commonly known as: COREG Take 3.125 mg by mouth every morning.   desvenlafaxine 50 MG 24 hr tablet Commonly known as: PRISTIQ Take 50 mg by mouth daily.   diltiazem 120 MG 24 hr capsule Commonly known as: CARDIZEM CD TAKE 1 CAPSULE (120 MG TOTAL) BY MOUTH DAILY.   donepezil 5 MG tablet Commonly known as: ARICEPT Take 5 mg by mouth at bedtime.   escitalopram 20 MG tablet Commonly known as: LEXAPRO Take 20 mg by mouth every evening.   feeding supplement Liqd Take 237 mLs by mouth 3 (three) times daily between meals.   gabapentin 300 MG capsule Commonly known as: Neurontin Take 1 capsule (300 mg total) by mouth at bedtime.   HYDROcodone-acetaminophen 7.5-325 MG tablet Commonly known as: NORCO Take 1 tablet by mouth every 4 (four) hours as needed for severe pain.   levothyroxine 88 MCG tablet Commonly known as: SYNTHROID Take 88 mcg by mouth every morning.   pantoprazole 40 MG tablet Commonly known as: PROTONIX Take 40 mg by mouth daily.   Stiolto Respimat 2.5-2.5 MCG/ACT Aers Generic drug: Tiotropium Bromide-Olodaterol Inhale 2 puffs into the lungs daily.   VITAMIN B-12 PO Take 1 tablet by mouth daily.   VITAMIN D-3 PO Take 1 capsule by mouth daily.   zolpidem 10 MG tablet Commonly known as: AMBIEN Take 10 mg by mouth at bedtime.               Durable Medical Equipment  (From admission, onward)           Start     Ordered   03/06/22 0915  For home use only DME oxygen  Once       Question Answer Comment  Length of Need 6 Months   Mode or (Route) Nasal cannula   Liters per Minute 2   Frequency Only at night (stationary unit needed)   Oxygen delivery system Gas      03/06/22 0914   03/06/22 0913  For home use only DME Walker rolling  Once       Question Answer Comment  Walker: With [place]   Patient needs a walker to treat with the following condition Physical  deconditioning      03/06/22 0912            Follow-up Information     Melrose Nakayama, MD Follow up on 03/15/2022.   Specialty: Cardiothoracic Surgery Why: Appointment is at 3:45, please get CXR at 3:15 at [facility] located on first floor of our office building Contact information: [address] [place] [address] [phone]                 Signed: Ellwood Handler, PA-C  03/08/2022, 8:35 AM

## 2022-03-02 NOTE — Progress Notes (Signed)
Foley cath removed and tolerated well by patient. 1900 ml were recorded before removal since 1130 last night. Patient has BSC by her bed and she knows to call for help before getting OOB. Call bell is within reach.

## 2022-03-03 ENCOUNTER — Inpatient Hospital Stay (HOSPITAL_COMMUNITY): Payer: Medicare Other

## 2022-03-03 DIAGNOSIS — E440 Moderate protein-calorie malnutrition: Secondary | ICD-10-CM | POA: Insufficient documentation

## 2022-03-03 LAB — SURGICAL PATHOLOGY

## 2022-03-03 LAB — CBC
HCT: 30.1 % — ABNORMAL LOW (ref 36.0–46.0)
Hemoglobin: 10 g/dL — ABNORMAL LOW (ref 12.0–15.0)
MCH: 28 pg (ref 26.0–34.0)
MCHC: 33.2 g/dL (ref 30.0–36.0)
MCV: 84.3 fL (ref 80.0–100.0)
Platelets: 244 10*3/uL (ref 150–400)
RBC: 3.57 MIL/uL — ABNORMAL LOW (ref 3.87–5.11)
RDW: 18.7 % — ABNORMAL HIGH (ref 11.5–15.5)
WBC: 11.2 10*3/uL — ABNORMAL HIGH (ref 4.0–10.5)
nRBC: 0 % (ref 0.0–0.2)

## 2022-03-03 LAB — COMPREHENSIVE METABOLIC PANEL
ALT: 16 U/L (ref 0–44)
AST: 22 U/L (ref 15–41)
Albumin: 2.8 g/dL — ABNORMAL LOW (ref 3.5–5.0)
Alkaline Phosphatase: 35 U/L — ABNORMAL LOW (ref 38–126)
Anion gap: 4 — ABNORMAL LOW (ref 5–15)
BUN: 7 mg/dL — ABNORMAL LOW (ref 8–23)
CO2: 26 mmol/L (ref 22–32)
Calcium: 8 mg/dL — ABNORMAL LOW (ref 8.9–10.3)
Chloride: 107 mmol/L (ref 98–111)
Creatinine, Ser: 0.67 mg/dL (ref 0.44–1.00)
GFR, Estimated: >60 mL/min (ref >60)
Glucose, Bld: 107 mg/dL — ABNORMAL HIGH (ref 70–99)
Potassium: 3.3 mmol/L — ABNORMAL LOW (ref 3.5–5.1)
Sodium: 137 mmol/L (ref 135–145)
Total Bilirubin: 0.3 mg/dL (ref 0.3–1.2)
Total Protein: 5.2 g/dL — ABNORMAL LOW (ref 6.5–8.1)

## 2022-03-03 MED ORDER — ENSURE ENLIVE PO LIQD
237.0000 mL | Freq: Three times a day (TID) | ORAL | Status: DC
  Administered 2022-03-04 – 2022-03-07 (×8): 237 mL via ORAL

## 2022-03-03 MED ORDER — POTASSIUM CHLORIDE CRYS ER 20 MEQ PO TBCR
40.0000 meq | EXTENDED_RELEASE_TABLET | Freq: Once | ORAL | Status: AC
  Administered 2022-03-03: 40 meq via ORAL
  Filled 2022-03-03: qty 2

## 2022-03-03 NOTE — Progress Notes (Signed)
Mobility Specialist Progress Note    03/03/22 1116  Mobility  Activity Ambulated with assistance in hallway  Level of Assistance Standby assist, set-up cues, supervision of patient - no hands on  Assistive Device Front wheel walker  Distance Ambulated (ft) 420 ft  Activity Response Tolerated well  Mobility Referral Yes  $Mobility charge 1 Mobility   Pre-Mobility: 70 HR, 130/50 (74) BP, 98% SpO2 Post-Mobility: 63 HR, 94% SpO2  Pt received in bed and agreeable. C/o some pain upon return to bed. Left with call bell in reach. RN notified.    Hildred Alamin Mobility Specialist  Please Psychologist, sport and exercise or Rehab Office at [phone]

## 2022-03-03 NOTE — Progress Notes (Addendum)
      [address]       [place] [address]             [phone]       2 Days Post-Op Procedure(s) (LRB): XI ROBOTIC ASSISTED THORACOSCOPY- RIGHT UPPER LOBE WEDGE RESECTION AND LOBECTOMY (Right) INTERCOSTAL NERVE BLOCK (Right) NODE DISSECTION (Right)  Subjective:   Patient notices she is more sore today.  She thinks she did too much yesterday.  States she used her breathing device.  Coughed a lot.   She did ambulate well.  She does not have help at home and does not want to go to her sisters house.  Objective: Vital signs in last 24 hours: Temp:  [98.2 F (36.8 C)-98.5 F (36.9 C)] 98.2 F (36.8 C) (11/15 0300) Pulse Rate:  [57-82] 58 (11/15 0557) Cardiac Rhythm: Normal sinus rhythm (11/15 0557) Resp:  [12-30] 16 (11/15 0557) BP: (107-140)/(50-88) 133/51 (11/15 0500) SpO2:  [88 %-98 %] 95 % (11/15 0557)  Intake/Output from previous day: 11/14 0701 - 11/15 0700 In: 590 [P.O.:590] Out: 1165 [Urine:825; Chest Tube:340]  General appearance: alert, cooperative, and no distress Heart: regular rate and rhythm Lungs: clear to auscultation bilaterally Abdomen: soft, non-tender; bowel sounds normal; no masses,  no organomegaly Extremities: extremities normal, atraumatic, no cyanosis or edema Wound: clean, serous drainage persists, + sub q emphysema long left chest into neck  Lab Results: Recent Labs    03/02/22 0019 03/03/22 0031  WBC 15.5* 11.2*  HGB 11.0* 10.0*  HCT 33.9* 30.1*  PLT 237 244   BMET:  Recent Labs    03/02/22 0019 03/03/22 0031  NA 137 137  K 4.1 3.3*  CL 105 107  CO2 23 26  GLUCOSE 145* 107*  BUN 7* 7*  CREATININE 0.75 0.67  CALCIUM 8.0* 8.0*    PT/INR: No results for input(s): "LABPROT", "INR" in the last 72 hours. ABG    Component Value Date/Time   PHART 7.46 (H) 02/26/2022 1012   HCO3 25.6 02/26/2022 1012   TCO2 22 11/05/2012 1358   O2SAT 98.2 02/26/2022 1012   CBG (last 3)  No results for input(s): "GLUCAP" in  the last 72 hours.  Assessment/Plan: S/P Procedure(s) (LRB): XI ROBOTIC ASSISTED THORACOSCOPY- RIGHT UPPER LOBE WEDGE RESECTION AND LOBECTOMY (Right) INTERCOSTAL NERVE BLOCK (Right) NODE DISSECTION (Right)  CV- NSR, HTN- continue Coreg, Cardizem Pulm- CT on water seal, 340 cc output- CXR with significant increase in sub q emphysema.Marland Kitchen leave chest tubes in place.. Hypokalemia- will replace Lovenox for DVT prophylaxis Malnutrition, likely chronic.. Albumin is 2.8, total protein is 5.2.Marland Kitchen will consult nutrition for dietary recommendations Dispo- patient stable, increase in sub q emphysema.. CT management per Dr. Roxan Hockey, will increase potassium supplement for hypokalemia, repeat CXR in AM   LOS: 2 days    Ellwood Handler, PA-C 03/03/2022 Patient seen and examined, agree with above Air leak is smaller today Will dc chest tube.  Keep pigtail to water seal Path pending Continue ambulation  Remo Lipps C. Roxan Hockey, MD [facility] [phone]

## 2022-03-03 NOTE — Progress Notes (Signed)
[place]ake chest tube removed, tolerated well. Pigtail chest tube retained on water seal.

## 2022-03-03 NOTE — Progress Notes (Addendum)
Initial Nutrition Assessment  DOCUMENTATION CODES:   Non-severe (moderate) malnutrition in context of chronic illness  INTERVENTION:  Liberalize diet to regular to limit restrictions and encourage oral intake.  Provide Ensure Enlive po TID between meals, each supplement provides 350 kcal and 20 grams of protein.  Provided handouts and education on "High Calorie, High Protein Nutrition Therapy" and "High Calorie, High Protein Recipes" from the Academy of Nutrition and Dietetics. Encouraged including a good source of protein at each meal that patient does like as she doesn't like meat (eggs, peanut butter, Greek yogurt, etc).   NUTRITION DIAGNOSIS:   Moderate Malnutrition related to chronic illness (COPD, right upper lobe lung nodule concerning for non-small cell carcinoma) as evidenced by moderate fat depletion, moderate muscle depletion.  GOAL:   Patient will meet greater than or equal to 90% of their needs  MONITOR:   PO intake, Supplement acceptance, Labs, Weight trends, I & O's  REASON FOR ASSESSMENT:   Consult Assessment of nutrition requirement/status  ASSESSMENT:   71 year old female with PMHx of anxiety, depression, HLD, HTN, paroxysmal atrial fibrillation, COPD, hypothyroidism, right chest spindle cell carcinoma tumor s/p right VATS with left mini thoracotomy and right lower lobe resection, GERD, liver cyst who was found to have right upper lobe lung nodule and is admitted s/p robotic assisted right upper lobe wedge resection, right upper lobectomy, and lymph note dissection on 03/02/22.  Per surgery note, post-operative diagnosis is non-small cell carcinoma of right upper lobe clinical stage IB.  Met with patient at bedside. She reports her appetite has been decreased since admission. She reports her appetite was good at home PTA. She does feel like her appetite is returning today. She reports at baseline eating 3 meals per day. She does not eat a lot of meat and tends to  eat more vegetables. For breakfast she typically has bacon with eggs and cereal. For lunch she has a sandwich. For dinner she has vegetables such as Brussels sprouts, broccoli, or green beans. She occasionally has meat such as steak with dinner approximately once per month. She denies any nausea, emesis, abdominal pain, constipation, diarrhea, or difficulty chewing or swallowing. Discussed findings from NFPE and nutritional status with patient. Discussed that she has increased needs for calories and protein. Provided education on high calorie, high protein diet and encouraged choosing a source of protein patient does like with each meal. She enjoys eggs, peanut butter, and Greek yogurt. She also enjoys drinking milk. Recommended drinking ONS such as Ensure Enlive at home. Discussed if difficult to obtain in home setting can also try [employer] in whole milk.  Pt denies any weight loss and reports she is weight-stable at around 145-150 lbs. Currently documented to be 68.5 kg (151 lbs). Per review of wt history in chart pt was 74.8 kg (164.6 lbs) in 2021. She lost 6.3 kg or 8.4% weight over approximately one year from 12/06/19 to 12/29/20 (not significant for time frame) and has remained relatively weight-stable since then per wt history in chart.  UOP: 825 mL (0.5 mL/kg/hr) + 1 occurrence unmeasured UOP  I/O: -1080 mL since admission  Medications reviewed and include: Dulcolax, carvedilol, gabapentin, levothyroxine, pantoprazole, senna-docusate  Labs reviewed: Potassium 3.3, BUN 7, Calcium 8 (corrects to 8.96 with albumin of 2.8)  Discussed with RN.  NUTRITION - FOCUSED PHYSICAL EXAM:  Flowsheet Row Most Recent Value  Orbital Region Moderate depletion  Upper Arm Region Moderate depletion  Thoracic and Lumbar Region Mild depletion  Buccal Region Moderate depletion  Temple Region Moderate depletion  Clavicle Bone Region Moderate depletion  Clavicle and Acromion Bone Region  Moderate depletion  Scapular Bone Region Mild depletion  Dorsal Hand Moderate depletion  Patellar Region Moderate depletion  Anterior Thigh Region Moderate depletion  Posterior Calf Region Moderate depletion  Edema (RD Assessment) None  Hair Reviewed  [Noted hair dry,  pt reports falling out easily]  Eyes Reviewed  Mouth Reviewed  Skin Reviewed  Nails Reviewed       Diet Order:   Diet Order             Diet regular Room service appropriate? Yes; Fluid consistency: Thin  Diet effective now                  EDUCATION NEEDS:   Education needs have been addressed  Skin:  Skin Assessment: Skin Integrity Issues: Skin Integrity Issues:: Incisions Incisions: closed incision right chest  Last BM:  PTA  Height:   Ht Readings from Last 1 Encounters:  03/01/22 _0  (1.676 m)   Weight:   Wt Readings from Last 1 Encounters:  03/01/22 68.5 kg   Ideal Body Weight:  59.1 kg  BMI:  Body mass index is 24.37 kg/m.  Estimated Nutritional Needs:   Kcal:  1800-2000  Protein:  90-100 grams  Fluid:  1.8-2 L/day  Loanne Drilling, MS, RD, LDN, CNSC Pager number available on Amion

## 2022-03-04 ENCOUNTER — Inpatient Hospital Stay (HOSPITAL_COMMUNITY): Payer: Medicare Other

## 2022-03-04 LAB — BASIC METABOLIC PANEL
Anion gap: 6 (ref 5–15)
BUN: 9 mg/dL (ref 8–23)
CO2: 27 mmol/L (ref 22–32)
Calcium: 8.4 mg/dL — ABNORMAL LOW (ref 8.9–10.3)
Chloride: 104 mmol/L (ref 98–111)
Creatinine, Ser: 0.74 mg/dL (ref 0.44–1.00)
GFR, Estimated: >60 mL/min (ref >60)
Glucose, Bld: 113 mg/dL — ABNORMAL HIGH (ref 70–99)
Potassium: 3.9 mmol/L (ref 3.5–5.1)
Sodium: 137 mmol/L (ref 135–145)

## 2022-03-04 NOTE — Progress Notes (Addendum)
      [address]       [place] [address]             [phone]      3 Days Post-Op Procedure(s) (LRB): XI ROBOTIC ASSISTED THORACOSCOPY- RIGHT UPPER LOBE WEDGE RESECTION AND LOBECTOMY (Right) INTERCOSTAL NERVE BLOCK (Right) NODE DISSECTION (Right)  Subjective:  Patient states she is sore, otherwise doing well.  She ambulated 420 ft with rolling walker yesterday.  Objective: Vital signs in last 24 hours: Temp:  [97.7 F (36.5 C)-98.1 F (36.7 C)] 98.1 F (36.7 C) (11/16 0400) Pulse Rate:  [59-78] 78 (11/16 0721) Cardiac Rhythm: Normal sinus rhythm (11/16 0714) Resp:  [16-21] 16 (11/16 0721) BP: (103-145)/(57-62) 145/62 (11/16 0400) SpO2:  [92 %-96 %] 94 % (11/16 0725)  Intake/Output from previous day: 11/15 0701 - 11/16 0700 In: 200 [P.O.:200] Out: 430 [Urine:350; Chest Tube:80] Intake/Output this shift: Total I/O In: -  Out: 30 [Chest Tube:30]  General appearance: alert, cooperative, and no distress Heart: regular rate and rhythm Lungs: clear to auscultation bilaterally Abdomen: soft, non-tender; bowel sounds normal; no masses,  no organomegaly Extremities: extremities normal, atraumatic, no cyanosis or edema Wound: clean and dry  Lab Results: Recent Labs    03/02/22 0019 03/03/22 0031  WBC 15.5* 11.2*  HGB 11.0* 10.0*  HCT 33.9* 30.1*  PLT 237 244   BMET:  Recent Labs    03/03/22 0031 03/04/22 0018  NA 137 137  K 3.3* 3.9  CL 107 104  CO2 26 27  GLUCOSE 107* 113*  BUN 7* 9  CREATININE 0.67 0.74  CALCIUM 8.0* 8.4*    PT/INR: No results for input(s): "LABPROT", "INR" in the last 72 hours. ABG    Component Value Date/Time   PHART 7.46 (H) 02/26/2022 1012   HCO3 25.6 02/26/2022 1012   TCO2 22 11/05/2012 1358   O2SAT 98.2 02/26/2022 1012   CBG (last 3)  No results for input(s): "GLUCAP" in the last 72 hours.  Assessment/Plan: S/P Procedure(s) (LRB): XI ROBOTIC ASSISTED THORACOSCOPY- RIGHT UPPER LOBE WEDGE RESECTION  AND LOBECTOMY (Right) INTERCOSTAL NERVE BLOCK (Right) NODE DISSECTION (Right)  CV- NSR, HTN- continue Coreg, Cardizem Pulm- CT removed yesterday, pigtail remains in place on waterseal without air leak.. sub q emphysema persists, right apical space is stable. ? Convert to Mini Express? Will discuss with Dr. Roxan Hockey Hypokalemia- improved up to 3.9 Moderate Malnutrition- continue supplementation per dietary recommendations Lovenox for DVT prophylaxis Dispo- patient stable, will discuss CT management with Dr. Roxan Hockey, likely for d/c in next 24-48 hours   LOS: 3 days    Ellwood Handler, PA-C 03/04/2022  Patient seen and examined, agree with above When I saw her the chest tube was disconnected.  After reconnecting there was an air leak- whether actual leak or just air that had gotten in remains to be determined.  Pain reasonably well controlled- has chronic back pain aggravated by surgery/ bed Continue ambulation Path - T2a,N1- stage IIB- will need adjuvant therapy  Remo Lipps C. Roxan Hockey, MD [facility] [phone]

## 2022-03-04 NOTE — Progress Notes (Signed)
Offered  ambulation along the hallway but refused. Explained the importance of ambulation but claimed that she overdid it yesterday  and will do  it first thing in the morning.

## 2022-03-04 NOTE — Care Management Important Message (Signed)
Important Message  Patient Details  Name: Andrea Morgan MRN: [id number] Date of Birth: 11/22/1950   Medicare Important Message Given:  Yes     Orbie Pyo 03/04/2022, 2:34 PM

## 2022-03-04 NOTE — TOC Progression Note (Addendum)
Transition of Care [facility]) - Progression Note    Patient Details  Name: Andrea Morgan MRN: [id number] Date of Birth: 1950-07-10  [facility]) CM/SW Contact  Zenon Mayo, RN Phone Number: 03/04/2022, 10:11 AM  Clinical Narrative:    POD3 robotic lobectomy,  chest tube out, but pig tail is still in, may possible go home with mini express, will need HHRN if goes home with mini express.  TOC following.  NCM offered choice for [facility], with Medicare.gov list, patient states she does not have a preference.  NCM made referral to [facility] with [facility].  Awaiting call back to see if he will be able to take referral.  Alvis Lemmings can not take the referral.          Expected Discharge Plan and Services                                                 Social Determinants of Health (SDOH) Interventions    Readmission Risk Interventions     No data to display

## 2022-03-05 ENCOUNTER — Inpatient Hospital Stay (HOSPITAL_COMMUNITY): Payer: Medicare Other

## 2022-03-05 DIAGNOSIS — C3491 Malignant neoplasm of unspecified part of right bronchus or lung: Secondary | ICD-10-CM

## 2022-03-05 MED ORDER — ALBUTEROL SULFATE (2.5 MG/3ML) 0.083% IN NEBU
2.5000 mg | INHALATION_SOLUTION | Freq: Two times a day (BID) | RESPIRATORY_TRACT | Status: DC
  Administered 2022-03-05 – 2022-03-06 (×2): 2.5 mg via RESPIRATORY_TRACT
  Filled 2022-03-05 (×2): qty 3

## 2022-03-05 NOTE — TOC Progression Note (Addendum)
Transition of Care [facility]) - Progression Note    Patient Details  Name: Andrea Morgan MRN: [id number] Date of Birth: 06-21-1950  Transition of Care [facility]) CM/SW Contact  Zenon Mayo, RN Phone Number: 03/05/2022, 12:57 PM  Clinical Narrative:    Plan to dc chest tube and plan to dc home tomorrow. Monitor for home oxygen needs. TOC following.         Expected Discharge Plan and Services                                                 Social Determinants of Health (SDOH) Interventions    Readmission Risk Interventions     No data to display

## 2022-03-05 NOTE — Plan of Care (Signed)
Problem: Education: Goal: Knowledge of disease or condition will improve 03/05/2022 2233 by Marcie Mowers, RN Outcome: Progressing 03/05/2022 2233 by Marcie Mowers, RN Outcome: Progressing Goal: Knowledge of the prescribed therapeutic regimen will improve 03/05/2022 2233 by Marcie Mowers, RN Outcome: Progressing 03/05/2022 2233 by Marcie Mowers, RN Outcome: Progressing   Problem: Activity: Goal: Risk for activity intolerance will decrease 03/05/2022 2233 by Marcie Mowers, RN Outcome: Progressing 03/05/2022 2233 by Marcie Mowers, RN Outcome: Progressing   Problem: Cardiac: Goal: Will achieve and/or maintain hemodynamic stability 03/05/2022 2233 by Marcie Mowers, RN Outcome: Progressing 03/05/2022 2233 by Marcie Mowers, RN Outcome: Progressing   Problem: Clinical Measurements: Goal: Postoperative complications will be avoided or minimized 03/05/2022 2233 by Marcie Mowers, RN Outcome: Progressing 03/05/2022 2233 by Marcie Mowers, RN Outcome: Progressing   Problem: Respiratory: Goal: Respiratory status will improve 03/05/2022 2233 by Marcie Mowers, RN Outcome: Progressing 03/05/2022 2233 by Marcie Mowers, RN Outcome: Progressing   Problem: Pain Management: Goal: Pain level will decrease 03/05/2022 2233 by Marcie Mowers, RN Outcome: Progressing 03/05/2022 2233 by Marcie Mowers, RN Outcome: Progressing   Problem: Skin Integrity: Goal: Wound healing without signs and symptoms infection will improve 03/05/2022 2233 by Marcie Mowers, RN Outcome: Progressing 03/05/2022 2233 by Marcie Mowers, RN Outcome: Progressing   Problem: Education: Goal: Knowledge of General Education information will improve Description: Including pain rating scale, medication(s)/side effects and non-pharmacologic comfort measures 03/05/2022 2233 by Marcie Mowers, RN Outcome: Progressing 03/05/2022 2233 by Marcie Mowers, RN Outcome: Progressing   Problem: Health  Behavior/Discharge Planning: Goal: Ability to manage health-related needs will improve 03/05/2022 2233 by Marcie Mowers, RN Outcome: Progressing 03/05/2022 2233 by Marcie Mowers, RN Outcome: Progressing   Problem: Clinical Measurements: Goal: Ability to maintain clinical measurements within normal limits will improve 03/05/2022 2233 by Marcie Mowers, RN Outcome: Progressing 03/05/2022 2233 by Marcie Mowers, RN Outcome: Progressing Goal: Will remain free from infection 03/05/2022 2233 by Marcie Mowers, RN Outcome: Progressing 03/05/2022 2233 by Marcie Mowers, RN Outcome: Progressing Goal: Diagnostic test results will improve 03/05/2022 2233 by Marcie Mowers, RN Outcome: Progressing 03/05/2022 2233 by Marcie Mowers, RN Outcome: Progressing Goal: Respiratory complications will improve 03/05/2022 2233 by Marcie Mowers, RN Outcome: Progressing 03/05/2022 2233 by Marcie Mowers, RN Outcome: Progressing Goal: Cardiovascular complication will be avoided 03/05/2022 2233 by Marcie Mowers, RN Outcome: Progressing 03/05/2022 2233 by Marcie Mowers, RN Outcome: Progressing   Problem: Activity: Goal: Risk for activity intolerance will decrease 03/05/2022 2233 by Marcie Mowers, RN Outcome: Progressing 03/05/2022 2233 by Marcie Mowers, RN Outcome: Progressing   Problem: Nutrition: Goal: Adequate nutrition will be maintained 03/05/2022 2233 by Marcie Mowers, RN Outcome: Progressing 03/05/2022 2233 by Marcie Mowers, RN Outcome: Progressing   Problem: Coping: Goal: Level of anxiety will decrease 03/05/2022 2233 by Marcie Mowers, RN Outcome: Progressing 03/05/2022 2233 by Marcie Mowers, RN Outcome: Progressing   Problem: Elimination: Goal: Will not experience complications related to bowel motility 03/05/2022 2233 by Marcie Mowers, RN Outcome: Progressing 03/05/2022 2233 by Marcie Mowers, RN Outcome: Progressing Goal: Will not experience complications  related to urinary retention 03/05/2022 2233 by Marcie Mowers, RN Outcome: Progressing 03/05/2022 2233 by Marcie Mowers, RN Outcome: Progressing   Problem: Pain Managment: Goal: General experience of comfort will improve 03/05/2022 2233 by Marcie Mowers, RN Outcome: Progressing  03/05/2022 2233 by Marcie Mowers, RN Outcome: Progressing   Problem: Safety: Goal: Ability to remain free from injury will improve 03/05/2022 2233 by Marcie Mowers, RN Outcome: Progressing 03/05/2022 2233 by Marcie Mowers, RN Outcome: Progressing   Problem: Skin Integrity: Goal: Risk for impaired skin integrity will decrease 03/05/2022 2233 by Marcie Mowers, RN Outcome: Progressing 03/05/2022 2233 by Marcie Mowers, RN Outcome: Progressing

## 2022-03-05 NOTE — Progress Notes (Addendum)
      [address]       [place] [address]             [phone]      4 Days Post-Op Procedure(s) (LRB): XI ROBOTIC ASSISTED THORACOSCOPY- RIGHT UPPER LOBE WEDGE RESECTION AND LOBECTOMY (Right) INTERCOSTAL NERVE BLOCK (Right) NODE DISSECTION (Right)  Subjective:  Patient slept well last night.  States she is sore this morning.  She has been coughing up a lot of sputum.  She states she has people that can check on her daily at discharge.  Objective: Vital signs in last 24 hours: Temp:  [97.6 F (36.4 C)-98.2 F (36.8 C)] 97.7 F (36.5 C) (11/17 0658) Pulse Rate:  [58-75] 72 (11/16 2356) Cardiac Rhythm: Normal sinus rhythm (11/16 1933) Resp:  [14-17] 14 (11/17 0658) BP: (102-117)/(42-87) 106/87 (11/17 0658) SpO2:  [93 %-97 %] 96 % (11/16 2028)  Intake/Output from previous day: 11/16 0701 - 11/17 0700 In: 477 [P.O.:477] Out: 740 [Urine:600; Chest Tube:140]  General appearance: alert, cooperative, and no distress Heart: regular rate and rhythm Lungs: clear to auscultation bilaterally Abdomen: soft, non-tender; bowel sounds normal; no masses,  no organomegaly Extremities: extremities normal, atraumatic, no cyanosis or edema Wound: clean and dry  Lab Results: Recent Labs    03/03/22 0031  WBC 11.2*  HGB 10.0*  HCT 30.1*  PLT 244   BMET:  Recent Labs    03/03/22 0031 03/04/22 0018  NA 137 137  K 3.3* 3.9  CL 107 104  CO2 26 27  GLUCOSE 107* 113*  BUN 7* 9  CREATININE 0.67 0.74  CALCIUM 8.0* 8.4*    PT/INR: No results for input(s): "LABPROT", "INR" in the last 72 hours. ABG    Component Value Date/Time   PHART 7.46 (H) 02/26/2022 1012   HCO3 25.6 02/26/2022 1012   TCO2 22 11/05/2012 1358   O2SAT 98.2 02/26/2022 1012   CBG (last 3)  No results for input(s): "GLUCAP" in the last 72 hours.  Assessment/Plan: S/P Procedure(s) (LRB): XI ROBOTIC ASSISTED THORACOSCOPY- RIGHT UPPER LOBE WEDGE RESECTION AND LOBECTOMY (Right) INTERCOSTAL  NERVE BLOCK (Right) NODE DISSECTION (Right)  CV- NSR, HTN- continue Coreg, Cardizem Pulm- Pigtail in place, no air leak present, Sub q emphysema remains stable, CXR with trace apical space.. will discuss removal with Dr. Roxan Hockey Moderate Malnutrition- continue supplements per dietary recommendations Lovenox for DVT prophylaxis Dispo- patient stable, no air leak this morning on water seal, CXR is stable.. will discuss chest tube removal with Dr. Roxan Hockey   LOS: 4 days    Ellwood Handler, PA-C 03/05/2022 Patient seen and examined, agree with above No air leak- will dc pigtail Probably home in AM if no issues  Remo Lipps C. Roxan Hockey, MD [facility] [phone]

## 2022-03-05 NOTE — Progress Notes (Signed)
Mobility Specialist Progress Note    03/05/22 1310  Mobility  Activity Ambulated with assistance in hallway  Level of Assistance Standby assist, set-up cues, supervision of patient - no hands on  Assistive Device Front wheel walker  Distance Ambulated (ft) 420 ft  Activity Response Tolerated well  Mobility Referral Yes  $Mobility charge 1 Mobility   Pre-Mobility: 75 HR, 94% SpO2 During Mobility: 107 HR, 93% SpO2 Post-Mobility: 86 HR, 92% SpO2  Pt received in bed and agreeable. No complaints on walk. Tolerated on RA. Returned to bed with call bell in reach.    Hildred Alamin Mobility Specialist  Please Psychologist, sport and exercise or Rehab Office at [phone]

## 2022-03-06 ENCOUNTER — Inpatient Hospital Stay (HOSPITAL_COMMUNITY): Payer: Medicare Other

## 2022-03-06 NOTE — Progress Notes (Addendum)
      [address]       [place] [address]             [phone]       5 Days Post-Op Procedure(s) (LRB): XI ROBOTIC ASSISTED THORACOSCOPY- RIGHT UPPER LOBE WEDGE RESECTION AND LOBECTOMY (Right) INTERCOSTAL NERVE BLOCK (Right) NODE DISSECTION (Right)  Subjective: Patient has some pain in right back, but better since chest tube removed.  Objective: Vital signs in last 24 hours: Temp:  [97.6 F (36.4 C)-98.4 F (36.9 C)] 98.1 F (36.7 C) (11/18 0710) Pulse Rate:  [60-80] 80 (11/18 0710) Cardiac Rhythm: Sinus bradycardia (11/17 2100) Resp:  [15-20] 19 (11/18 0710) BP: (103-147)/(50-67) 124/57 (11/18 0710) SpO2:  [90 %-98 %] 92 % (11/18 0843)     Intake/Output from previous day: 11/17 0701 - 11/18 0700 In: -  Out: 795 [Urine:725; Chest Tube:70]   Physical Exam:  Cardiovascular: RRR Pulmonary: Clear to auscultation on left and slightly diminished right base Abdomen: Soft, non tender, bowel sounds present. Extremities: No lower extremity edema. Wounds: Clean and dry.  No erythema or signs of infection.   Lab Results: CBC:No results for input(s): "WBC", "HGB", "HCT", "PLT" in the last 72 hours. BMET:  Recent Labs    03/04/22 0018  NA 137  K 3.9  CL 104  CO2 27  GLUCOSE 113*  BUN 9  CREATININE 0.74  CALCIUM 8.4*    PT/INR: No results for input(s): "LABPROT", "INR" in the last 72 hours. ABG:  INR: Will add last result for INR, ABG once components are confirmed Will add last 4 CBG results once components are confirmed  Assessment/Plan:  1. CV - SR. On Coreg and Cardizem as taken prior to surgery. 2.  Pulmonary - History of COPD. On 3 liters via [place]. Per day nurse, she needs oxygen at night as desat's. Asked nurse to document so may arrange for home oxygen. CXR this am shows extensive right chest wall emphysema right chest wall and neck, right apical pneumothorax perhaps slightly decreased and ? increased inferiorly. Encourage incentive  spirometer. 3. Expected post op blood loss anemia-Last H and H 10 and 30.1 4. On Lovenox for DVT prophylaxis  Donielle M ZimmermanPA-C 03/06/2022,8:53 AM  Home O2 assessment IS ambulation Dispo planning  Eriel Dunckel O Maiana Hennigan

## 2022-03-06 NOTE — Progress Notes (Addendum)
Patient was 92% at rest on 2L, haS required 2L overnight to prevent desats--she was placed on 3L this morning by respiratory, and is now again 92% on 3L at rest--will try to wean down some today, could possibly go home tomorrow but needs to be on 2L or less going home--patient has ambulated in room,hallway and to bathroom, appx 100 ft, remained 94% on room air

## 2022-03-07 ENCOUNTER — Inpatient Hospital Stay (HOSPITAL_COMMUNITY): Payer: Medicare Other

## 2022-03-07 NOTE — Plan of Care (Signed)
Patient remains in PCU. Now weaned to room air. Potential discharge to home tomorrow.  Problem: Education: Goal: Knowledge of disease or condition will improve Outcome: Progressing Goal: Knowledge of the prescribed therapeutic regimen will improve Outcome: Progressing   Problem: Activity: Goal: Risk for activity intolerance will decrease Outcome: Progressing   Problem: Cardiac: Goal: Will achieve and/or maintain hemodynamic stability Outcome: Progressing   Problem: Clinical Measurements: Goal: Postoperative complications will be avoided or minimized Outcome: Progressing   Problem: Respiratory: Goal: Respiratory status will improve Outcome: Progressing   Problem: Pain Management: Goal: Pain level will decrease Outcome: Progressing   Problem: Skin Integrity: Goal: Wound healing without signs and symptoms infection will improve Outcome: Progressing   Problem: Education: Goal: Knowledge of General Education information will improve Description: Including pain rating scale, medication(s)/side effects and non-pharmacologic comfort measures Outcome: Progressing   Problem: Health Behavior/Discharge Planning: Goal: Ability to manage health-related needs will improve Outcome: Progressing   Problem: Clinical Measurements: Goal: Ability to maintain clinical measurements within normal limits will improve Outcome: Progressing Goal: Will remain free from infection Outcome: Progressing Goal: Diagnostic test results will improve Outcome: Progressing Goal: Respiratory complications will improve Outcome: Progressing Goal: Cardiovascular complication will be avoided Outcome: Progressing   Problem: Activity: Goal: Risk for activity intolerance will decrease Outcome: Progressing   Problem: Nutrition: Goal: Adequate nutrition will be maintained Outcome: Progressing   Problem: Coping: Goal: Level of anxiety will decrease Outcome: Progressing   Problem: Elimination: Goal:  Will not experience complications related to bowel motility Outcome: Progressing Goal: Will not experience complications related to urinary retention Outcome: Progressing   Problem: Pain Managment: Goal: General experience of comfort will improve Outcome: Progressing   Problem: Safety: Goal: Ability to remain free from injury will improve Outcome: Progressing   Problem: Skin Integrity: Goal: Risk for impaired skin integrity will decrease Outcome: Progressing

## 2022-03-07 NOTE — Progress Notes (Addendum)
      [address]       [place] [address]             [phone]       6 Days Post-Op Procedure(s) (LRB): XI ROBOTIC ASSISTED THORACOSCOPY- RIGHT UPPER LOBE WEDGE RESECTION AND LOBECTOMY (Right) INTERCOSTAL NERVE BLOCK (Right) NODE DISSECTION (Right)  Subjective: Patient walked at least 2 times yesterday. No complaint this am.  Objective: Vital signs in last 24 hours: Temp:  [97.8 F (36.6 C)-98.3 F (36.8 C)] 98.3 F (36.8 C) (11/19 0744) Pulse Rate:  [59-83] 73 (11/19 0744) Cardiac Rhythm: Normal sinus rhythm (11/19 0700) Resp:  [13-20] 19 (11/19 0744) BP: (103-131)/(51-55) 118/51 (11/19 0744) SpO2:  [88 %-95 %] 94 % (11/19 0852)     Intake/Output from previous day: 11/18 0701 - 11/19 0700 In: 720 [P.O.:720] Out: -    Physical Exam:  Cardiovascular: RRR Pulmonary: Coarse breath sounds. Subcutaneous emphysema bilaterally neck and all over right chest Abdomen: Soft, non tender, bowel sounds present. Extremities: No lower extremity edema. Wounds: Clean and dry.  No erythema or signs of infection.   Lab Results: CBC:No results for input(s): "WBC", "HGB", "HCT", "PLT" in the last 72 hours. BMET:  No results for input(s): "NA", "K", "CL", "CO2", "GLUCOSE", "BUN", "CREATININE", "CALCIUM" in the last 72 hours.   PT/INR: No results for input(s): "LABPROT", "INR" in the last 72 hours. ABG:  INR: Will add last result for INR, ABG once components are confirmed Will add last 4 CBG results once components are confirmed  Assessment/Plan:  1. CV - SR. On Coreg and Cardizem as taken prior to surgery. 2.  Pulmonary - History of COPD. On 2 liters via [place] this am. She might not meet criteria for home oxygen. CXR this am shows extensive right chest wall emphysema right chest wall and neck, right apical pneumothorax appears stable, slight increased in pleural fluid, and extensive subcutaneous emphysema . Check CXR in am. Encourage incentive spirometer. 3.  Expected post op blood loss anemia-Last H and H 10 and 30.1 4. On Lovenox for DVT prophylaxis  Donielle M ZimmermanPA-C 03/07/2022,9:38 AM  Worsening subQ emphysema on CXR Will watch 1 more day  Ellyson Rarick O Tavion Senkbeil

## 2022-03-07 NOTE — Progress Notes (Signed)
Patient's Saturations were 91-94% on room air most of the night. However, 2L was applied at 0600 due to patient desatting to 89 while napping. Currently 94-95% on 2L.

## 2022-03-07 NOTE — Plan of Care (Signed)

## 2022-03-08 ENCOUNTER — Inpatient Hospital Stay (HOSPITAL_COMMUNITY): Payer: Medicare Other

## 2022-03-08 MED ORDER — HYDROCODONE-ACETAMINOPHEN 7.5-325 MG PO TABS: 1.0000 | ORAL_TABLET | ORAL | 0 refills | Status: DC | PRN

## 2022-03-08 MED ORDER — GABAPENTIN 300 MG PO CAPS: 300.0000 mg | ORAL_CAPSULE | Freq: Every day | ORAL | 1 refills | Status: DC

## 2022-03-08 MED ORDER — ENSURE ENLIVE PO LIQD: 237.0000 mL | Freq: Three times a day (TID) | ORAL | 12 refills | Status: AC

## 2022-03-08 NOTE — Progress Notes (Signed)
Mobility Specialist Progress Note    03/08/22 1011  Mobility  Activity Ambulated independently in hallway  Level of Assistance Standby assist, set-up cues, supervision of patient - no hands on  Assistive Device None  Distance Ambulated (ft) 420 ft  Activity Response Tolerated well  $Mobility charge 1 Mobility   Pt received in bed and agreeable. No complaints on walk. Returned to bed with call bell in reach.    Hildred Alamin Mobility Specialist  Please Psychologist, sport and exercise or Rehab Office at [phone]

## 2022-03-08 NOTE — TOC Transition Note (Addendum)
Transition of Care [facility]) - CM/SW Discharge Note   Patient Details  Name: Andrea Morgan MRN: [id number] Date of Birth: [birth year]  [facility] Care [facility]) CM/SW Contact:  Zenon Mayo, RN Phone Number: 03/08/2022, 8:51 AM   Clinical Narrative:    Patient is for dc today, Patient states her sister will transport her home today.   She does not have a mini express at all, and she does not need oxygen per Staff RN.  She has no other needs.         Patient Goals and CMS Choice        Discharge Placement                       Discharge Plan and Services                                     Social Determinants of Health (SDOH) Interventions     Readmission Risk Interventions     No data to display

## 2022-03-08 NOTE — Progress Notes (Addendum)
      [address]       [place] [address]             [phone]      7 Days Post-Op Procedure(s) (LRB): XI ROBOTIC ASSISTED THORACOSCOPY- RIGHT UPPER LOBE WEDGE RESECTION AND LOBECTOMY (Right) INTERCOSTAL NERVE BLOCK (Right) NODE DISSECTION (Right)  Subjective:  Patient doing well.  No complaints.  She is ambulating  + BM  Objective: Vital signs in last 24 hours: Temp:  [97.6 F (36.4 C)-98.3 F (36.8 C)] 97.6 F (36.4 C) (11/20 0500) Pulse Rate:  [59-86] 83 (11/20 0600) Cardiac Rhythm: Normal sinus rhythm (11/19 2025) Resp:  [16-22] 19 (11/20 0600) BP: (108-130)/(51-67) 130/63 (11/20 0500) SpO2:  [90 %-97 %] 93 % (11/20 0600)  Intake/Output from previous day: 11/19 0701 - 11/20 0700 In: -  Out: 800 [Urine:800]  General appearance: alert, cooperative, and no distress Heart: regular rate and rhythm Lungs: clear to auscultation bilaterally Abdomen: soft, non-tender; bowel sounds normal; no masses,  no organomegaly Extremities: extremities normal, atraumatic, no cyanosis or edema Wound: clean and dry  Lab Results: No results for input(s): "WBC", "HGB", "HCT", "PLT" in the last 72 hours. BMET: No results for input(s): "NA", "K", "CL", "CO2", "GLUCOSE", "BUN", "CREATININE", "CALCIUM" in the last 72 hours.  PT/INR: No results for input(s): "LABPROT", "INR" in the last 72 hours. ABG    Component Value Date/Time   PHART 7.46 (H) 02/26/2022 1012   HCO3 25.6 02/26/2022 1012   TCO2 22 11/05/2012 1358   O2SAT 98.2 02/26/2022 1012   CBG (last 3)  No results for input(s): "GLUCAP" in the last 72 hours.  Assessment/Plan: S/P Procedure(s) (LRB): XI ROBOTIC ASSISTED THORACOSCOPY- RIGHT UPPER LOBE WEDGE RESECTION AND LOBECTOMY (Right) INTERCOSTAL NERVE BLOCK (Right) NODE DISSECTION (Right)  CV- NSR on Coreg, Cardizem Pulm- H/O COPD, off oxygen, continue IS at discharge... CXR has been stable in appearance of pneumothorax, sub q emphysema  persists Lovenox for DVT prophlyaxis Dispo- patient stable, will d/c home today if CXR remains stable   LOS: 7 days    Ellwood Handler, PA-C 03/08/2022 Patient seen and examined, agree with above CXR shows small apical space filling with fluid, decreased SQ air Dc home today Will arrange Onc consult as outpatient  Remo Lipps C. Roxan Hockey, MD [facility] [phone]

## 2022-03-09 ENCOUNTER — Other Ambulatory Visit: Payer: Self-pay | Admitting: Thoracic Surgery (Cardiothoracic Vascular Surgery)

## 2022-03-09 DIAGNOSIS — Z0189 Encounter for other specified special examinations: Secondary | ICD-10-CM

## 2022-03-15 ENCOUNTER — Telehealth: Payer: Self-pay | Admitting: *Deleted

## 2022-03-15 ENCOUNTER — Encounter: Payer: Self-pay | Admitting: Thoracic Surgery (Cardiothoracic Vascular Surgery)

## 2022-03-15 ENCOUNTER — Ambulatory Visit
Admission: RE | Admit: 2022-03-15 | Discharge: 2022-03-15 | Disposition: A | Discharge status: Home / Self Care | Payer: Medicare Other | Source: Ambulatory Visit | Attending: Thoracic Surgery (Cardiothoracic Vascular Surgery) | Admitting: Thoracic Surgery (Cardiothoracic Vascular Surgery)

## 2022-03-15 ENCOUNTER — Ambulatory Visit (INDEPENDENT_AMBULATORY_CARE_PROVIDER_SITE_OTHER): Payer: Self-pay | Admitting: Thoracic Surgery (Cardiothoracic Vascular Surgery)

## 2022-03-15 VITALS — BP 115/79 | HR 110 | Resp 18

## 2022-03-15 DIAGNOSIS — Z0189 Encounter for other specified special examinations: Secondary | ICD-10-CM

## 2022-03-15 DIAGNOSIS — Z09 Encounter for follow-up examination after completed treatment for conditions other than malignant neoplasm: Secondary | ICD-10-CM

## 2022-03-15 MED ORDER — PREGABALIN 25 MG PO CAPS: 25.0000 mg | ORAL_CAPSULE | Freq: Two times a day (BID) | ORAL | 1 refills | Status: DC

## 2022-03-15 NOTE — Progress Notes (Signed)
[address]       [place] [address]             [phone]     HPI: Ms. Biggers returns for a scheduled follow-up visit scheduled follow-up visit after recent right upper lobectomy  Andrea Morgan is a 71 year old woman with a past medical history significant for remote tobacco abuse, COPD, solitary fibrous tumor of the pleura, hyperlipidemia, hypothyroidism, anemia, degenerative disc disease, hiatal hernia, reflux, anxiety, and depression.  She quit smoking in 2013.  In 2022 she had a low-dose CT for lung cancer screening which showed multiple pulmonary nodules.  Follow-up in August 2023 showed a right upper lobe nodule had increased in size.  On PET/CT it was mildly hypermetabolic.  I did a robotic assisted right upper lobectomy on 03/01/2022.  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  Postoperatively she had a lot of subcutaneous emphysema but never much of an air leak.  We finally got her tube out and she went home on day 7.  She is very anxious and tearful today.  When she was discharged her sister was going to be her primary caretaker.  However they got into a disagreement and her sister left.  She said she went 2 days without any food or medications.  She is extremely upset about that.  This morning she told the nurse she had taken 3 Xanax tablets to try to sleep but was still unable to.  She currently denies that.  She says that she has not been taking gabapentin secondary to nausea.  She is not taking hydrocodone either, because she is afraid that will make her feel poorly as well.  She has been using Tylenol once or twice a day for her pain.  Past Medical History:  Diagnosis Date   ADHD (attention deficit hyperactivity disorder)    Adrenal adenoma    Anal condyloma    Anemia    Anxiety    Arthritis    arthritis left jaw / pain     Cancer ([place]) 2013   lung cancer   DDD (degenerative disc disease), thoracic    Depression     Dyspnea    with exertion   Dyspnea on exertion    Emphysema/COPD (HCC)    GERD (gastroesophageal reflux disease)    Hiatal hernia    causes no problems per patient   History of adenomatous polyp of colon    06/ 2016 hyperplastic   History of cancer of lower lobe bronchus or lung followed by dr gerhardt-- lov 01/ 2017   06-02-2011  s/p  Right VATS w/ left mini thoracotomy resection 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)   History of condyloma acuminatum    removal anal canal condyloma 11-08-2013   History of kidney stones    passed stones   History of panic attacks    Hyperlipidemia    Hypertension    Hypothyroidism    Insomnia    Liver cyst    per CT in 2012   PAF (paroxysmal atrial fibrillation) [facility]) cardiologist-  dr Mare Ferrari   single episode Atrial Fibrillation w/ RVR post-op day 3 the right colectomy surgery on 11-08-2013       Pneumonia    PONV (postoperative nausea and vomiting)    Pulmonary nodule, right    right upper lobe per last Chest CT 05-14-2015    Current Outpatient Medications  Medication Sig  Dispense Refill   albuterol (VENTOLIN HFA) 108 (90 Base) MCG/ACT inhaler Inhale 2 puffs into the lungs every 6 (six) hours as needed for wheezing or shortness of breath.     alendronate (FOSAMAX) 70 MG tablet Take 70 mg by mouth See admin instructions. 70 mg once a week on Thursday     ALPRAZolam (XANAX) 1 MG tablet Take 1 mg by mouth 3 (three) times daily as needed for anxiety.     ASCORBIC ACID PO Take 1 tablet by mouth daily. Vitamin C, unknown strength.     aspirin EC 81 MG tablet Take 81 mg by mouth daily.     atorvastatin (LIPITOR) 20 MG tablet Take 20 mg by mouth at bedtime.      buPROPion (WELLBUTRIN SR) 150 MG 12 hr tablet Take 150 mg by mouth daily.     carvedilol (COREG) 3.125 MG tablet Take 3.125 mg by mouth every morning.     Cholecalciferol (VITAMIN D-3 PO) Take 1 capsule by mouth daily.     Cyanocobalamin (VITAMIN B-12 PO)  Take 1 tablet by mouth daily.     desvenlafaxine (PRISTIQ) 50 MG 24 hr tablet Take 50 mg by mouth daily.     diltiazem (CARDIZEM CD) 120 MG 24 hr capsule TAKE 1 CAPSULE (120 MG TOTAL) BY MOUTH DAILY. 30 capsule 7   donepezil (ARICEPT) 5 MG tablet Take 5 mg by mouth at bedtime.     escitalopram (LEXAPRO) 20 MG tablet Take 20 mg by mouth every evening.   3   feeding supplement (ENSURE ENLIVE / ENSURE PLUS) LIQD Take 237 mLs by mouth 3 (three) times daily between meals. 237 mL 12   HYDROcodone-acetaminophen (NORCO) 7.5-325 MG tablet Take 1 tablet by mouth every 4 (four) hours as needed for severe pain. 30 tablet 0   levothyroxine (SYNTHROID) 88 MCG tablet Take 88 mcg by mouth every morning.     pantoprazole (PROTONIX) 40 MG tablet Take 40 mg by mouth daily.     pregabalin (LYRICA) 25 MG capsule Take 1 capsule (25 mg total) by mouth 2 (two) times daily. 60 capsule 1   Tiotropium Bromide-Olodaterol (STIOLTO RESPIMAT) 2.5-2.5 MCG/ACT AERS Inhale 2 puffs into the lungs daily. 4 g 3   zolpidem (AMBIEN) 10 MG tablet Take 10 mg by mouth at bedtime.      No current facility-administered medications for this visit.    Physical Exam BP 115/79 (BP Location: Right Arm, Patient Position: Sitting)   Pulse (!) 110   Resp 18   SpO2 95% Comment: RA 71 year old woman in no acute distress Alert and oriented x3 with no focal deficits Lungs slightly diminished at right base but otherwise clear, no wheezing Cardiac slightly tacky and regular No peripheral edema  Diagnostic Tests: CHEST - 2 VIEW   COMPARISON:  March 08, 2022.   FINDINGS: The heart size and mediastinal contours are within normal limits. Left lung is clear. Mild right apical hydropneumothorax is again noted with decreased amount of fluid. Mild right basilar atelectasis is noted. Continued subcutaneous emphysema over right lateral chest wall. The visualized skeletal structures are unremarkable.   IMPRESSION: Mild right apical  hydropneumothorax is again noted with decreased amount of fluid. Mild right basilar atelectasis is noted. Continue subcutaneous emphysema over right lateral chest wall.     Electronically Signed   By: Marijo Conception M.D.   On: 03/15/2022 15:24   I personally reviewed the chest x-ray images.  It shows postoperative changes from right upper lobectomy  with a small apical space.  Impression: Kristyn Obyrne is a 71 year old woman who underwent a robotic assisted right upper lobectomy for an adenocarcinoma on 03/01/2022.  Although a clinical stage Ia lesion, it turned out to be a pathologic stage IIb lesion with visceral pleural involvement and a positive N1 lymph node.  She is now about 2 weeks out from surgery.  She is having pain which is not unexpected this early on.  Unfortunately she has not been taking gabapentin due to nausea.  I recommended that we try Lyrica 25 mg p.o. twice daily to see if she would be able to tolerate that better.  She may use the hydrocodone as prescribed but was cautioned not to use it more than prescribed.  She really does not want to do that.  She may use acetaminophen but was cautioned not to use more than 8 extra strength Tylenols a day.  She may also use nonsteroidal anti-inflammatory such as Advil or Aleve to see if they help.  I strongly cautioned her not to use any medications, particularly Xanax, in a dose or frequency more than prescribed.  She should not drive or engage in any heavy physical activity.  A friend brought her to the office visit and has helped her obtain some food and needs.  She also will be able to help her obtain her prescription.  Plan: DC gabapentin Lyrica 25 mg p.o. twice daily Return in 1 week to check on progress.  Melrose Nakayama, MD [facility] [phone]

## 2022-03-15 NOTE — Telephone Encounter (Signed)
Returned call to patient to follow up on question left with answering service over the weekend. Per patient, she never received a return call regarding her anxiety and depression. Patient states she has not been sleeping and took 3 Xanax last night to help her sleep. Advised patient her prescription is written to take one as needed for anxiety. Advised patient to contact her PCP regarding sleep and anxiety medications. Patient was very emotional on the telephone stating her only support person is her sister who was suppose to be helping her. Per patient, her sister and her got into an argument which caused her sister to "destroy" her house and leave her. Patient states she will no longer speak with her sister. Offered to contact friends on behalf of the patient but patient refused. Patient has an appt scheduled for this afternoon here in the office. Taxi services set up by our office to ensure patient makes it to her follow up appt this afternoon with Dr. Roxan Hockey. Patient aware.

## 2022-03-24 ENCOUNTER — Ambulatory Visit: Payer: Self-pay | Admitting: Thoracic Surgery (Cardiothoracic Vascular Surgery)

## 2022-03-24 ENCOUNTER — Telehealth: Payer: Self-pay | Admitting: *Deleted

## 2022-03-24 NOTE — Telephone Encounter (Signed)
Patient contacted the office stating she no longer has a ride to her appt with Dr. Roxan Hockey this afternoon. Last follow up patient arrived via [place] service supplied by our office. Same service offered to patient for today's follow up, however, patient refuses stating she did not have a pleasant experience last time. Patient requesting to reschedule appt. Appt rescheduled for next week. Patient thankful and states new appt date/time works. Nothing further at this time.

## 2022-03-30 ENCOUNTER — Encounter: Payer: Self-pay | Admitting: Emergency Medicine

## 2022-03-30 ENCOUNTER — Ambulatory Visit (INDEPENDENT_AMBULATORY_CARE_PROVIDER_SITE_OTHER): Payer: Medicare Other | Admitting: Emergency Medicine

## 2022-03-30 ENCOUNTER — Ambulatory Visit (INDEPENDENT_AMBULATORY_CARE_PROVIDER_SITE_OTHER): Payer: Self-pay | Admitting: Thoracic Surgery (Cardiothoracic Vascular Surgery)

## 2022-03-30 VITALS — BP 126/74 | HR 73 | Temp 98.3°F | Ht 64.0 in | Wt 149.6 lb

## 2022-03-30 VITALS — BP 158/83 | HR 71 | Resp 20 | Ht 66.0 in | Wt 150.0 lb

## 2022-03-30 DIAGNOSIS — J449 Chronic obstructive pulmonary disease, unspecified: Secondary | ICD-10-CM | POA: Diagnosis not present

## 2022-03-30 DIAGNOSIS — C3491 Malignant neoplasm of unspecified part of right bronchus or lung: Secondary | ICD-10-CM

## 2022-03-30 DIAGNOSIS — Z09 Encounter for follow-up examination after completed treatment for conditions other than malignant neoplasm: Secondary | ICD-10-CM

## 2022-03-30 MED ORDER — HYDROCODONE-ACETAMINOPHEN 7.5-325 MG PO TABS: 1.0000 | ORAL_TABLET | ORAL | 0 refills | Status: DC | PRN

## 2022-03-30 MED ORDER — STIOLTO RESPIMAT 2.5-2.5 MCG/ACT IN AERS: 2.0000 | INHALATION_SPRAY | Freq: Every day | RESPIRATORY_TRACT | 5 refills | Status: AC

## 2022-03-30 NOTE — Assessment & Plan Note (Signed)
She benefited from the [employer].  Minimal albuterol use.  We will restart the Stiolto on a schedule and follow.

## 2022-03-30 NOTE — Progress Notes (Signed)
Subjective:    Patient ID: Andrea Morgan, female    DOB: Mar 30, 1951, 71 y.o.   MRN: [id number]  HPI  ROV 02/03/22 --follow-up visit 71 year old woman with history of COPD, prior chest spindle cell carcinoma post right VATS with minithoracotomy, resection 2013.  She has a slowly enlarging 7.7 mm lateral right upper lobe pulmonary nodule that is slightly positive on PET scan done on 12/07/2021.  There are other smaller nodules that are below the threshold of PET detection.  She underwent pulmonary function testing today as below. She started stiolto last time and feels that she is benefiting,. Rare albuterol use. Her functional capacity is much improved as is her cough.   Pulmonary function testing performed today and reviewed by me shows moderate obstruction with a positive bronchodilator response, normal lung volumes and a decreased diffusion capacity 68% predicted.   ROV 03/30/2022 -71 year old woman with history of former tobacco use, COPD, prior chest spindle cell carcinoma (right VATS minithoracotomy 2013).  We have been following a slowly enlarging lateral right upper lobe pulmonary nodule with some hypermetabolism on PET.  She underwent robotic assisted VATS 03/01/2022 for stage IIb adenocarcinoma with a positive N1 node.  She had a prolonged air leak.  Today she reports that she is still having some post-op pain, but overall she is beginning to feel better. She is not having any breathing trouble, pretty good functional capacity. No cough or sputum.  I had tried her on Stiolto, she does feel that she benefited significantly. She would like to be back on it. Rare albuterol use.    Review of Systems As per HPI  Past Medical History:  Diagnosis Date   ADHD (attention deficit hyperactivity disorder)    Adrenal adenoma    Anal condyloma    Anemia    Anxiety    Arthritis    arthritis left jaw / pain     Cancer ([place]) 2013   lung cancer   DDD (degenerative disc disease), thoracic     Depression    Dyspnea    with exertion   Dyspnea on exertion    Emphysema/COPD (HCC)    GERD (gastroesophageal reflux disease)    Hiatal hernia    causes no problems per patient   History of adenomatous polyp of colon    06/ 2016 hyperplastic   History of cancer of lower lobe bronchus or lung followed by dr gerhardt-- lov 01/ 2017   06-02-2011  s/p  Right VATS w/ left mini thoracotomy resection 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)   History of condyloma acuminatum    removal anal canal condyloma 11-08-2013   History of kidney stones    passed stones   History of panic attacks    Hyperlipidemia    Hypertension    Hypothyroidism    Insomnia    Liver cyst    per CT in 2012   [facility]mal [facility] cardiologist-  dr Mare Ferrari   single episode Atrial Fibrillation w/ RVR post-op day 3 the right colectomy surgery on 11-08-2013       Pneumonia    PONV (postoperative nausea and vomiting)    Pulmonary nodule, right    right upper lobe per last Chest CT 05-14-2015     Family History  Problem Relation Age of Onset   Asthma Mother    Heart disease Mother    Lung cancer Father    Kidney cancer Sister    Cancer Sister  kidney   Emphysema Sister    Colon cancer Brother        late 20's   Liver cancer Brother    Asthma Maternal Grandmother    Ovarian cancer Maternal Aunt    Breast cancer Maternal Aunt        x2   Prostate cancer Maternal Uncle    Anesthesia problems Neg Hx    Esophageal cancer Neg Hx    Stomach cancer Neg Hx      Social History   Socioeconomic History   Marital status: Divorced    Spouse name: Not on file   Number of children: 1   Years of education: 12   Highest education level: Not on file  Occupational History   Occupation: caregiver    Employer: [employer]  Tobacco Use   Smoking status: Former    Packs/day: 1.00    Years: 45.00    Total pack years: 45.00    Types: Cigarettes     Quit date: 04/13/2012    Years since quitting: 9.9   Smokeless tobacco: Never  Vaping Use   Vaping Use: Never used  Substance and Sexual Activity   Alcohol use: No   Drug use: Yes    Types: Marijuana    Comment: 12/29/20 daily use per pt; stopped Marijuana but now eats edibles weed   Sexual activity: Not on file  Other Topics Concern   Not on file  Social History Narrative   Not on file   Social Determinants of Health   Financial Resource Strain: Not on file  Food Insecurity: No Food Insecurity (03/07/2022)   Hunger Vital Sign    Worried About Running Out of Food in the Last Year: Never true    Ran Out of Food in the Last Year: Never true  Transportation Needs: No Transportation Needs (03/07/2022)   PRAPARE - Hydrologist (Medical): No    Lack of Transportation (Non-Medical): No  Physical Activity: Not on file  Stress: Not on file  Social Connections: Not on file  Intimate Partner Violence: Not At Risk (03/07/2022)   Humiliation, Afraid, Rape, and Kick questionnaire    Fear of Current or Ex-Partner: No    Emotionally Abused: No    Physically Abused: No    Sexually Abused: No     Allergies  Allergen Reactions   Oxycontin [Oxycodone] Itching   Sulfa Antibiotics Hives   Morphine And Related Itching     Outpatient Medications Prior to Visit  Medication Sig Dispense Refill   albuterol (VENTOLIN HFA) 108 (90 Base) MCG/ACT inhaler Inhale 2 puffs into the lungs every 6 (six) hours as needed for wheezing or shortness of breath.     alendronate (FOSAMAX) 70 MG tablet Take 70 mg by mouth See admin instructions. 70 mg once a week on Thursday     ALPRAZolam (XANAX) 1 MG tablet Take 1 mg by mouth 3 (three) times daily as needed for anxiety.     ASCORBIC ACID PO Take 1 tablet by mouth daily. Vitamin C, unknown strength.     aspirin EC 81 MG tablet Take 81 mg by mouth daily.     atorvastatin (LIPITOR) 20 MG tablet Take 20 mg by mouth at bedtime.       buPROPion (WELLBUTRIN SR) 150 MG 12 hr tablet Take 150 mg by mouth daily.     carvedilol (COREG) 3.125 MG tablet Take 3.125 mg by mouth every morning.     Cholecalciferol (  VITAMIN D-3 PO) Take 1 capsule by mouth daily.     Cyanocobalamin (VITAMIN B-12 PO) Take 1 tablet by mouth daily.     desvenlafaxine (PRISTIQ) 50 MG 24 hr tablet Take 50 mg by mouth daily.     diltiazem (CARDIZEM CD) 120 MG 24 hr capsule TAKE 1 CAPSULE (120 MG TOTAL) BY MOUTH DAILY. 30 capsule 7   donepezil (ARICEPT) 5 MG tablet Take 5 mg by mouth at bedtime.     escitalopram (LEXAPRO) 20 MG tablet Take 20 mg by mouth every evening.   3   feeding supplement (ENSURE ENLIVE / ENSURE PLUS) LIQD Take 237 mLs by mouth 3 (three) times daily between meals. 237 mL 12   HYDROcodone-acetaminophen (NORCO) 7.5-325 MG tablet Take 1 tablet by mouth every 4 (four) hours as needed for severe pain. 15 tablet 0   levothyroxine (SYNTHROID) 88 MCG tablet Take 88 mcg by mouth every morning.     pantoprazole (PROTONIX) 40 MG tablet Take 40 mg by mouth daily.     pregabalin (LYRICA) 25 MG capsule Take 1 capsule (25 mg total) by mouth 2 (two) times daily. 60 capsule 1   zolpidem (AMBIEN) 10 MG tablet Take 10 mg by mouth at bedtime.      Tiotropium Bromide-Olodaterol (STIOLTO RESPIMAT) 2.5-2.5 MCG/ACT AERS Inhale 2 puffs into the lungs daily. (Patient not taking: Reported on 03/30/2022) 4 g 3   No facility-administered medications prior to visit.        Objective:   Physical Exam Vitals:   03/30/22 1454  BP: 126/74  Pulse: 73  Temp: 98.3 F (36.8 C)  TempSrc: Oral  SpO2: 98%  Weight: 149 lb 9.6 oz (67.9 kg)  Height: 5\' 4"  (1.626 m)    Gen: Pleasant, well-nourished, in no distress,  normal affect, no cough today  ENT: No lesions,  mouth clear,  oropharynx clear, no postnasal drip  Neck: No JVD, no stridor  Lungs: No use of accessory muscles, no crackles or wheezing on normal respiration, no wheeze on forced  expiration  Cardiovascular: RRR, heart sounds normal, no murmur or gallops, no peripheral edema  Musculoskeletal: No deformities, no cyanosis or clubbing  Neuro: alert, awake, non focal  Skin: Warm, no lesions or rash      Assessment & Plan:  COPD (chronic obstructive pulmonary disease) She benefited from the [place].  Minimal albuterol use.  We will restart the Stiolto on a schedule and follow.  Adenocarcinoma of lung, right ([place]) Stage IIb disease with a positive N1 node.  She discussed with Dr. Roxan Hockey and is going to be referred to oncology.  Continue to push her stamina and exercise postop.   Baltazar Apo, MD, PhD 03/30/2022, 3:13 PM [facility] [phone] or if no answer before 7:00PM call [phone] For any issues after 7:00PM please call eLink [phone]

## 2022-03-30 NOTE — Patient Instructions (Signed)
Follow-up with Dr. Roxan Hockey as planned Agree with referral to Oncology as planned by Dr. Roxan Hockey We will restart Stiolto 2 puffs once daily.  Take this medication every day as a maintenance inhaler Keep albuterol available to use 2 puffs up to every 4 hours if needed for shortness of breath, chest tightness, wheezing.  Follow Dr. Lamonte Sakai in 6 months or sooner if you have any problems.

## 2022-03-30 NOTE — Progress Notes (Signed)
[address]       [place] [address]             [phone]     HPI: Ms. Carol returns for follow-up after recent right upper lobectomy  Andrea Morgan is a 71 year old woman with a history of remote tobacco abuse (quit 2013), COPD, solitary fibrous tumor of the pleura, hyperlipidemia, hypothyroidism, anemia, degenerative disc disease, anal hernia, reflux, anxiety, and depression.  She was found to have a right upper lobe nodule on a low-dose CT for lung cancer screening.  On follow-up it increased in size.  It was mildly hypermetabolic on PET/CT.  She underwent robotic assisted right upper lobectomy on 03/01/2022.  There were some adhesions from her previous surgery.  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  She went home on day 7.  I saw in the office on 03/15/2022.  She was having a lot of pain at that time.  We started her on Lyrica.  She was also in a bad place emotionally that day and she is not sure if she ever even got that prescription or if she has been taking that medication.  She was using hydrocodone mostly at night because she was having difficulty sleeping.  She has run out of that medication.  Past Medical History:  Diagnosis Date   ADHD (attention deficit hyperactivity disorder)    Adrenal adenoma    Anal condyloma    Anemia    Anxiety    Arthritis    arthritis left jaw / pain     Cancer ([facility]) 2013   lung cancer   DDD (degenerative disc disease), thoracic    Depression    Dyspnea    with exertion   Dyspnea on exertion    Emphysema/COPD ([facility])    GERD (gastroesophageal reflux disease)    Hiatal hernia    causes no problems per patient   History of adenomatous polyp of colon    06/ 2016 hyperplastic   History of cancer of lower lobe bronchus or lung followed by dr gerhardt-- lov 01/ 2017   06-02-2011  s/p  Right VATS w/ left mini thoracotomy resection 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)   History of condyloma acuminatum    removal anal canal condyloma 11-08-2013   History of kidney stones    passed stones   History of panic attacks    Hyperlipidemia    Hypertension    Hypothyroidism    Insomnia    Liver cyst    per CT in 2012   PAF (paroxysmal atrial fibrillation) [facility]) cardiologist-  dr Mare Ferrari   single episode Atrial Fibrillation w/ RVR post-op day 3 the right colectomy surgery on 11-08-2013       Pneumonia    PONV (postoperative nausea and vomiting)    Pulmonary nodule, right    right upper lobe per last Chest CT 05-14-2015      Current Outpatient Medications  Medication Sig Dispense Refill   albuterol (VENTOLIN HFA) 108 (90 Base) MCG/ACT inhaler Inhale 2 puffs into the lungs every 6 (six) hours as needed for wheezing or shortness of breath.     alendronate (FOSAMAX) 70 MG tablet Take 70 mg by mouth See admin instructions. 70 mg once a week on Thursday     ALPRAZolam (XANAX) 1 MG tablet Take 1 mg by mouth 3 (three) times daily as needed for anxiety.     ASCORBIC ACID  PO Take 1 tablet by mouth daily. Vitamin C, unknown strength.     aspirin EC 81 MG tablet Take 81 mg by mouth daily.     atorvastatin (LIPITOR) 20 MG tablet Take 20 mg by mouth at bedtime.      buPROPion (WELLBUTRIN SR) 150 MG 12 hr tablet Take 150 mg by mouth daily.     carvedilol (COREG) 3.125 MG tablet Take 3.125 mg by mouth every morning.     Cholecalciferol (VITAMIN D-3 PO) Take 1 capsule by mouth daily.     Cyanocobalamin (VITAMIN B-12 PO) Take 1 tablet by mouth daily.     desvenlafaxine (PRISTIQ) 50 MG 24 hr tablet Take 50 mg by mouth daily.     diltiazem (CARDIZEM CD) 120 MG 24 hr capsule TAKE 1 CAPSULE (120 MG TOTAL) BY MOUTH DAILY. 30 capsule 7   donepezil (ARICEPT) 5 MG tablet Take 5 mg by mouth at bedtime.     escitalopram (LEXAPRO) 20 MG tablet Take 20 mg by mouth every evening.   3   feeding supplement (ENSURE ENLIVE / ENSURE PLUS) LIQD Take 237 mLs by mouth 3 (three)  times daily between meals. 237 mL 12   levothyroxine (SYNTHROID) 88 MCG tablet Take 88 mcg by mouth every morning.     pantoprazole (PROTONIX) 40 MG tablet Take 40 mg by mouth daily.     pregabalin (LYRICA) 25 MG capsule Take 1 capsule (25 mg total) by mouth 2 (two) times daily. 60 capsule 1   Tiotropium Bromide-Olodaterol (STIOLTO RESPIMAT) 2.5-2.5 MCG/ACT AERS Inhale 2 puffs into the lungs daily. 4 g 3   zolpidem (AMBIEN) 10 MG tablet Take 10 mg by mouth at bedtime.      HYDROcodone-acetaminophen (NORCO) 7.5-325 MG tablet Take 1 tablet by mouth every 4 (four) hours as needed for severe pain. 15 tablet 0   No current facility-administered medications for this visit.    Physical Exam BP (!) 158/83 (BP Location: Left Arm, Patient Position: Sitting)   Pulse 71   Resp 20   Ht 5\' 6"  (1.676 m)   Wt 150 lb (68 kg)   SpO2 97% Comment: ra  BMI 24.90 kg/m  71 year old woman in no acute distress Alert and oriented x 3 with no focal deficits Lungs slightly diminished on right but otherwise clear Incisions clean dry and intact Cardiac regular rate and rhythm No peripheral edema  Diagnostic Tests: I personally reviewed the chest x-ray images.  There is a small apical space.  Some residual subcutaneous emphysema which is resolving.  Impression: Andrea Morgan is a 71 year old woman who underwent robotic assisted right upper lobectomy for a stage IIb (T2a, N1) adenocarcinoma on 03/01/2022.  Respiratory standpoint she is doing well.  She has noticed a little bit less respiratory reserve but is not having any acute respiratory issues.  Pain continues to be an issue.  She is not sure if she is even taking the Lyrica.  I told her to check on that and if she has been on it she can let us know and we could increase the dose.  If she has not been taking it then she will start taking it.  I did give her a prescription for hydrocodone 7.5/325, 15 tablets, no refills every 6 hours as needed.  She has  stage IIb disease.  She needs to see Dr. Julien Nordmann to discuss adjuvant chemotherapy.  We will arrange for that appointment.  Plan: Refer to Dr. Julien Nordmann oncology Lyrica plus as needed hydrocodone for  pain Return in 1 month with PA and lateral chest x-ray  Melrose Nakayama, MD [facility] [phone]

## 2022-03-30 NOTE — Assessment & Plan Note (Signed)
Stage IIb disease with a positive N1 node.  She discussed with Dr. Roxan Hockey and is going to be referred to oncology.  Continue to push her stamina and exercise postop.

## 2022-03-31 ENCOUNTER — Telehealth: Payer: Self-pay | Admitting: Internal Medicine

## 2022-03-31 NOTE — Telephone Encounter (Signed)
Scheduled appointment per referral. Patient is aware of appointment date and time. Patient is aware to arrive 15 mins prior to appointment time and to bring updated insurance cards. Patient is aware of location.   

## 2022-04-05 NOTE — Progress Notes (Unsigned)
[place] [phone]   [phone]  CONSULT NOTE  REFERRING PHYSICIAN: Dr. Roxan Hockey   REASON FOR CONSULTATION:  Stage II NSCLC, adenocarcinoma   HPI Andrea Morgan is a 71 y.o. female with a history of tobacco abuse, COPD, hyperlipidemia, hypothyroidism, anemia, anal hernia, reflux, anxiety, depression, and degenerative disease disease is referred to the clinic for newly diagnosed lung cancer.   The patient's evaluation began when she had a low-dose lung cancer screening evaluation on 04/16/22 which showed a lung RADS 3 nodule in the periphery of the right upper lobe measuring approximately 6.6 mm.  This was followed closely with a repeat CT scan of the chest on 11/20/2021 which showed this nodule enlarged to 7.7 mm and was noted to be suspicious with lung RADS 4A  The patient subsequently had a staging PET scan on 12/07/2021 which showed low-level metabolic activity in the spiculated right upper lobe nodule suspicious for primary pulmonary neoplasm.  The patient underwent robotic assisted right upper lobectomy on 03/01/2022 under the care of Dr. Roxan Hockey.  The final pathology [id number]) was consistent with stage IIb (T2a, N1) non-small cell lung cancer, adenocarcinoma.  The tumor measured 1.3 cm and involves the visceral pleura and focal lymphovascular space involvement.   Due to this, the patient is referred to the clinic to discuss possible adjuvant treatment.  Overall, the patient is feeling fair today except she is having some postsurgical pain.  She called Dr. Leonarda Salon office yesterday about possible refill of the [place].  She is also taking Lyrica.  He is also been taking Tylenol and using a heating pad.  Otherwise she denies any fever, chills, unexplained weight loss, dyspnea on exertion, cough, nausea, vomiting, diarrhea, constipation, headaches, or vision changes.  The patient states her mother had a lot of medical problems  but no cancer.  The patient's father was a Building control surveyor and was diagnosed with lung cancer around the age of 63.  She has a brother with colon cancer and a sister who recently had a nephrectomy due to possible renal cell carcinoma.  The patient was a caregiver for many years.  She is divorced.  She has 1 daughter who she is not close with.  She quit smoking in 2013.  She smoked less than 1 pack of cigarettes per day for approximately 47 years.  Denies any alcohol use.  She has a history of smoking pot.  She sometimes uses edibles.   HPI  Past Medical History:  Diagnosis Date   ADHD (attention deficit hyperactivity disorder)    Adrenal adenoma    Anal condyloma    Anemia    Anxiety    Arthritis    arthritis left jaw / pain     Cancer ([place]) 2013   lung cancer   DDD (degenerative disc disease), thoracic    Depression    Dyspnea    with exertion   Dyspnea on exertion    Emphysema/COPD (HCC)    GERD (gastroesophageal reflux disease)    Hiatal hernia    causes no problems per patient   History of adenomatous polyp of colon    06/ 2016 hyperplastic   History of cancer of lower lobe bronchus or lung followed by dr gerhardt-- lov 01/ 2017   06-02-2011  s/p  Right VATS w/ left mini thoracotomy resection 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)   History of condyloma acuminatum    removal anal canal condyloma 11-08-2013  History of kidney stones    passed stones   History of panic attacks    Hyperlipidemia    Hypertension    Hypothyroidism    Insomnia    Liver cyst    per CT in 2012   PAF (paroxysmal atrial fibrillation) [facility]) cardiologist-  dr Mare Ferrari   single episode Atrial Fibrillation w/ RVR post-op day 3 the right colectomy surgery on 11-08-2013       Pneumonia    PONV (postoperative nausea and vomiting)    Pulmonary nodule, right    right upper lobe per last Chest CT 05-14-2015    Past Surgical History:  Procedure Laterality Date   [place]   C5 -- C7   COLONOSCOPY  last one 10-17-2014   ESOPHAGOGASTRODUODENOSCOPY  05/21/2011   Procedure: ESOPHAGOGASTRODUODENOSCOPY (EGD);  Surgeon: Beryle Beams, MD;  Location: [facility];  Service: Endoscopy;  Laterality: N/A;   EXAMINATION UNDER ANESTHESIA N/A 11/08/2013   Procedure: ANAL EXAM UNDER ANESTHESIA WITH BIOPSY;  Surgeon: [name], MD;  Location: [facility];  Service: General;  Laterality: N/A;   INTERCOSTAL NERVE BLOCK Right 03/01/2022   Procedure: INTERCOSTAL NERVE BLOCK;  Surgeon: Melrose Nakayama, MD;  Location: [place];  Service: Thoracic;  Laterality: Right;   LAPAROSCOPIC PARTIAL COLECTOMY N/A 11/08/2013   Procedure: laparoscopic partial right colectomy;  Surgeon: [name], MD;  Location: [facility];  Service: General;  Laterality: N/A;   MASS EXCISION  06/02/2011   Procedure: EXCISION MASS;  Surgeon: Grace Isaac, MD;  Location: [place];  Service: Thoracic;;  minithoracotomy resection spindle cell tumor right chest   MASS EXCISION N/A 05/06/2016   Procedure: EXCISION ANAL CONDYLOMA;  Surgeon: [name], MD;  Location: [facility];  Service: General;  Laterality: N/A;   NODE DISSECTION Right 03/01/2022   Procedure: NODE DISSECTION;  Surgeon: Melrose Nakayama, MD;  Location: [place];  Service: Thoracic;  Laterality: Right;   THORACIC DISCECTOMY Right 01/13/2016   Procedure: RIGHT THORACIC ONE THORACIC TWO THORACIC LAMINOTOMY AND MICRODISCECTOMY;  Surgeon: Jovita Gamma, MD;  Location: [facility];  Service: Neurosurgery;  Laterality: Right;   TONSILLECTOMY  1971   TRANSTHORACIC ECHOCARDIOGRAM  08/28/2014   ef 55-60%/  trivial MR and TR   VIDEO BRONCHOSCOPY  05/11/2011   Procedure: VIDEO BRONCHOSCOPY WITH FLUORO;  Surgeon: Collene Gobble, MD;  Location: [facility];  Service: Cardiopulmonary;  Laterality: Bilateral;   VIDEO BRONCHOSCOPY  06/02/2011   Procedure: VIDEO BRONCHOSCOPY;  Surgeon: Grace Isaac, MD;   Location: [facility];  Service: Thoracic;  Laterality: N/A;    Family History  Problem Relation Age of Onset   Asthma Mother    Heart disease Mother    Lung cancer Father    Kidney cancer Sister    Cancer Sister        kidney   Emphysema Sister    Colon cancer Brother        late 73's   Liver cancer Brother    Asthma Maternal Grandmother    Ovarian cancer Maternal Aunt    Breast cancer Maternal Aunt        x2   Prostate cancer Maternal Uncle    Anesthesia problems Neg Hx    Esophageal cancer Neg Hx    Stomach cancer Neg Hx     Social History Social History   Tobacco Use   Smoking status: Former    Packs/day: 1.00  Years: 45.00    Total pack years: 45.00    Types: Cigarettes    Quit date: 04/13/2012    Years since quitting: 9.9   Smokeless tobacco: Never  Vaping Use   Vaping Use: Never used  Substance Use Topics   Alcohol use: No   Drug use: Yes    Types: Marijuana    Comment: 12/29/20 daily use per pt; stopped Marijuana but now eats edibles weed    Allergies  Allergen Reactions   Oxycontin [Oxycodone] Itching   Sulfa Antibiotics Hives   Morphine And Related Itching    Current Outpatient Medications  Medication Sig Dispense Refill   albuterol (VENTOLIN HFA) 108 (90 Base) MCG/ACT inhaler Inhale 2 puffs into the lungs every 6 (six) hours as needed for wheezing or shortness of breath.     alendronate (FOSAMAX) 70 MG tablet Take 70 mg by mouth See admin instructions. 70 mg once a week on Thursday     ALPRAZolam (XANAX) 1 MG tablet Take 1 mg by mouth 3 (three) times daily as needed for anxiety.     ASCORBIC ACID PO Take 1 tablet by mouth daily. Vitamin C, unknown strength.     aspirin EC 81 MG tablet Take 81 mg by mouth daily.     atorvastatin (LIPITOR) 20 MG tablet Take 20 mg by mouth at bedtime.      buPROPion (WELLBUTRIN SR) 150 MG 12 hr tablet Take 150 mg by mouth daily.     carvedilol (COREG) 3.125 MG tablet Take 3.125 mg by mouth every morning.      Cholecalciferol (VITAMIN D-3 PO) Take 1 capsule by mouth daily.     Cyanocobalamin (VITAMIN B-12 PO) Take 1 tablet by mouth daily.     desvenlafaxine (PRISTIQ) 50 MG 24 hr tablet Take 50 mg by mouth daily.     diltiazem (CARDIZEM CD) 120 MG 24 hr capsule TAKE 1 CAPSULE (120 MG TOTAL) BY MOUTH DAILY. 30 capsule 7   donepezil (ARICEPT) 5 MG tablet Take 5 mg by mouth at bedtime.     escitalopram (LEXAPRO) 20 MG tablet Take 20 mg by mouth every evening.   3   feeding supplement (ENSURE ENLIVE / ENSURE PLUS) LIQD Take 237 mLs by mouth 3 (three) times daily between meals. 237 mL 12   HYDROcodone-acetaminophen (NORCO) 7.5-325 MG tablet Take 1 tablet by mouth every 4 (four) hours as needed for severe pain. 15 tablet 0   levothyroxine (SYNTHROID) 88 MCG tablet Take 88 mcg by mouth every morning.     pantoprazole (PROTONIX) 40 MG tablet Take 40 mg by mouth daily.     pregabalin (LYRICA) 25 MG capsule Take 1 capsule (25 mg total) by mouth 2 (two) times daily. 60 capsule 1   Tiotropium Bromide-Olodaterol (STIOLTO RESPIMAT) 2.5-2.5 MCG/ACT AERS Inhale 2 puffs into the lungs daily. 4 g 5   zolpidem (AMBIEN) 10 MG tablet Take 10 mg by mouth at bedtime.      No current facility-administered medications for this visit.    REVIEW OF SYSTEMS:   Review of Systems  Constitutional: Negative for appetite change, chills, fatigue, fever and unexpected weight change.  HENT:   Negative for mouth sores, nosebleeds, sore throat and trouble swallowing.   Eyes: Negative for eye problems and icterus.  Respiratory: Negative for cough, hemoptysis, shortness of breath and wheezing.   Cardiovascular: Positive for postop rib pain.  Negative for leg swelling.  Gastrointestinal: Negative for abdominal pain, constipation, diarrhea, nausea and vomiting.  Genitourinary:  Negative for bladder incontinence, difficulty urinating, dysuria, frequency and hematuria.   Musculoskeletal: Negative for back pain, gait problem, neck pain and  neck stiffness.  Skin: Negative for itching and rash.  Neurological: Negative for dizziness, extremity weakness, gait problem, headaches, light-headedness and seizures.  Hematological: Negative for adenopathy. Does not bruise/bleed easily.  Psychiatric/Behavioral: Negative for confusion, depression and sleep disturbance. The patient is not nervous/anxious.     PHYSICAL EXAMINATION:  Blood pressure (!) 120/47, pulse 80, temperature (!) 97.5 F (36.4 C), temperature source Oral, resp. rate 14, weight 156 lb 8 oz (71 kg), SpO2 97 %.  ECOG PERFORMANCE STATUS: 1  Physical Exam  Constitutional: Oriented to person, place, and time and well-developed, well-nourished, and in no distress.  HENT:  Head: Normocephalic and atraumatic.  Mouth/Throat: Oropharynx is clear and moist. No oropharyngeal exudate.  Eyes: Conjunctivae are normal. Right eye exhibits no discharge. Left eye exhibits no discharge. No scleral icterus.  Neck: Normal range of motion. Neck supple.  Cardiovascular: Normal rate, regular rhythm, normal heart sounds and intact distal pulses.   Pulmonary/Chest: Effort normal and breath sounds normal. No respiratory distress. No wheezes. No rales.  Abdominal: Soft. Bowel sounds are normal. Exhibits no distension and no mass. There is no tenderness.  Musculoskeletal: Normal range of motion. Exhibits no edema.  Lymphadenopathy:    No cervical adenopathy.  Neurological: Alert and oriented to person, place, and time. Exhibits normal muscle tone. Gait normal. Coordination normal.  Skin: Skin is warm and dry. No rash noted. Not diaphoretic. No erythema. No pallor.  Psychiatric: Mood, memory and judgment normal.  Vitals reviewed.  LABORATORY DATA: Lab Results  Component Value Date   WBC 11.2 (H) 04/07/2022   HGB 11.3 (L) 04/07/2022   HCT 34.3 (L) 04/07/2022   MCV 85.5 04/07/2022   PLT 290 04/07/2022      Chemistry      Component Value Date/Time   NA 140 04/07/2022 1322   K 4.0  04/07/2022 1322   CL 104 04/07/2022 1322   CO2 34 (H) 04/07/2022 1322   BUN 23 04/07/2022 1322   CREATININE 0.77 04/07/2022 1322   CREATININE 0.71 04/23/2013 1443      Component Value Date/Time   CALCIUM 9.5 04/07/2022 1322   ALKPHOS 56 04/07/2022 1322   AST 14 (L) 04/07/2022 1322   ALT 13 04/07/2022 1322   BILITOT 0.2 (L) 04/07/2022 1322       RADIOGRAPHIC STUDIES: DG Chest 2 View  Result Date: 03/15/2022 CLINICAL DATA:  Chest pain, shortness of breath. EXAM: CHEST - 2 VIEW COMPARISON:  March 08, 2022. FINDINGS: The heart size and mediastinal contours are within normal limits. Left lung is clear. Mild right apical hydropneumothorax is again noted with decreased amount of fluid. Mild right basilar atelectasis is noted. Continued subcutaneous emphysema over right lateral chest wall. The visualized skeletal structures are unremarkable. IMPRESSION: Mild right apical hydropneumothorax is again noted with decreased amount of fluid. Mild right basilar atelectasis is noted. Continue subcutaneous emphysema over right lateral chest wall. Electronically Signed   By: Marijo Conception M.D.   On: 03/15/2022 15:24    ASSESSMENT: This is a very pleasant 71 year old female diagnosed with stage IIb (T2a, N1, M0) non-small cell lung cancer, adenocarcinoma.  She presented with a right upper lobe nodule.  Molecular studies with PD-L1 and foundation 1 requested today.  Patient underwent a right upper lobectomy on 03/01/2022 under the care of Dr. Roxan Hockey.  The final pathology showed a 1.3 cm adenocarcinoma  with visceral pleural involvement and focal lymphovascular involvement.  There is also a positive lymph node.   Dr. Julien Nordmann had a lengthy discussion with the patient today about her current condition and recommended treatment options.  Dr. Julien Nordmann discussed given the positive lymph node and pleural involvement that he would recommend adjuvant treatment.  We have requested foundation 1 and PD-L1  testing today.   In the meantime, we will arrange for the patient to have a brain MRI to complete the staging workup.  We will see her back for follow-up visit in 3 weeks to see if the patient is a candidate for targeted treatment.  Otherwise, treatment will likely be chemotherapy for 4 cycles.  However, we will discuss this in more depth once we have the results of her molecular studies to see what she is a candidate for.   The patient voices understanding of current disease status and treatment options and is in agreement with the current care plan.  All questions were answered. The patient knows to call the clinic with any problems, questions or concerns. We can certainly see the patient much sooner if necessary.  Thank you so much for allowing me to participate in the care of Andrea Morgan. I will continue to follow up the patient with you and assist in her care.   Disclaimer: This note was dictated with voice recognition software. Similar sounding words can inadvertently be transcribed and may not be corrected upon review.   Yentl Verge L Cresencia Asmus April 07, 2022, 2:34 PM  ADDENDUM: Hematology/Oncology Attending: I had a face-to-face encounter with the patient today.  I reviewed her records, lab, scan as well as the pathology report and recommended her care plan.  This is a 71 years old white female with history of COPD, dyslipidemia, hypothyroidism, anemia, GERD, anxiety, depression as well as degenerative disc disease and tobacco abuse.  The patient had low-dose CT screening of the lung on April 16, 2021 and that showed few scattered lung nodules identified the largest nodule was in the periphery of the right upper lobe with remainder of the diameter of 6.6 mm.  He had repeat CT scan of the chest on 11/20/2021 and that showed enlargement of the spiculated lateral right upper lobe nodule measuring 7.7 mm.  The patient had a PET scan on 12/07/2021 and that showed low-level  metabolic activity in the spiculated right upper lobe pulmonary nodule suspicious for primary pulmonary neoplasm.  There was no hypermetabolic adenopathy or evidence of hypermetabolic metastatic disease in the chest, abdomen or pelvis.  No significant abnormal hypermetabolic activity in the additional scattered subcentimeter pulmonary nodules which all are below the resolution for PET/CT. The patient was referred to Dr. Roxan Hockey and on March 02, 2022 she underwent Xi robotic assisted right upper lobe wedge resection followed by right upper lobectomy and lymph node dissection.  The final pathology [id number]) showed adenocarcinoma, acinar predominant, 1.3 cm with carcinoma involving the visceral pleura connective tissue with focal lymphovascular space involvement by tumor.  There was one lymph node at level 10 with metastatic adenocarcinoma.  Total tumor size was 2.5 cm. 3 [facility] kindly referred the patient to me today for evaluation and recommendation regarding treatment of her condition. I had a lengthy discussion with the patient today about her disease stage, prognosis and treatment options. The patient has a stage IIb (T2a, N1, M0) non-small cell lung cancer, adenocarcinoma status post right upper lobectomy with lymph node dissection on March 02, 2022. I explained to the patient  that there was a survival benefit for adjuvant treatment for patient with a stage IIb non-small cell lung cancer. Her adjuvant treatment option would depend on the molecular studies and PD-L1 expression. If the patient has no actionable mutation like EGFR or ALK, she would benefit from adjuvant systemic chemotherapy with platinum based regimen likely with cisplatin and Alimta for 4 cycles followed by immunotherapy if she has PD-L1 expression of 1% or higher. If the patient has an actionable mutation like EGFR, she may benefit from treatment with adjuvant targeted therapy with Tagrisso with or without  chemotherapy. We will send her tissue block to be tested for molecular studies and PD-L1 expression by foundation 1. We will see the patient back for follow-up visit in around 3 weeks for more detailed discussion of her treatment options based on the molecular studies.  We will also order MRI of the brain to rule out brain metastasis. The patient is in agreement with the current plan. She was advised to call immediately if she has any other concerning symptoms in the interval. The total time spent in the appointment was 60 minutes. Disclaimer: This note was dictated with voice recognition software. Similar sounding words can inadvertently be transcribed and may be missed upon review. Eilleen Kempf, MD

## 2022-04-06 ENCOUNTER — Other Ambulatory Visit: Payer: Self-pay | Admitting: Medical Oncology

## 2022-04-06 ENCOUNTER — Telehealth: Payer: Self-pay | Admitting: *Deleted

## 2022-04-06 DIAGNOSIS — C3491 Malignant neoplasm of unspecified part of right bronchus or lung: Secondary | ICD-10-CM

## 2022-04-06 NOTE — Telephone Encounter (Signed)
Patient contacted the office stating she was seen by her PCP today and was told she would have to contact our office for pain medication refills. Patient states she is experiencing pain that runs from her rib cage to her mid back. Patient describes as a burning sensation. Patient unaware whether she is taking Lyrica or not. Asked patient to go through medication bottles while on the phone. Patient currently not taking the Lyrica. Reviewed medication with patient and advised to begin taking medication as prescribed. Patient acknowledges instructions.

## 2022-04-07 ENCOUNTER — Other Ambulatory Visit: Payer: Self-pay

## 2022-04-07 ENCOUNTER — Inpatient Hospital Stay: Payer: Medicare Other | Attending: Physician Assistant

## 2022-04-07 ENCOUNTER — Inpatient Hospital Stay (HOSPITAL_BASED_OUTPATIENT_CLINIC_OR_DEPARTMENT_OTHER): Payer: Medicare Other | Admitting: Physician Assistant

## 2022-04-07 VITALS — BP 120/47 | HR 80 | Temp 97.5°F | Resp 14 | Wt 156.5 lb

## 2022-04-07 DIAGNOSIS — Z803 Family history of malignant neoplasm of breast: Secondary | ICD-10-CM

## 2022-04-07 DIAGNOSIS — C3491 Malignant neoplasm of unspecified part of right bronchus or lung: Secondary | ICD-10-CM

## 2022-04-07 DIAGNOSIS — C3411 Malignant neoplasm of upper lobe, right bronchus or lung: Secondary | ICD-10-CM | POA: Insufficient documentation

## 2022-04-07 DIAGNOSIS — Z809 Family history of malignant neoplasm, unspecified: Secondary | ICD-10-CM

## 2022-04-07 DIAGNOSIS — J449 Chronic obstructive pulmonary disease, unspecified: Secondary | ICD-10-CM

## 2022-04-07 DIAGNOSIS — Z902 Acquired absence of lung [part of]: Secondary | ICD-10-CM | POA: Insufficient documentation

## 2022-04-07 DIAGNOSIS — Z801 Family history of malignant neoplasm of trachea, bronchus and lung: Secondary | ICD-10-CM | POA: Diagnosis not present

## 2022-04-07 DIAGNOSIS — Z8042 Family history of malignant neoplasm of prostate: Secondary | ICD-10-CM | POA: Diagnosis not present

## 2022-04-07 DIAGNOSIS — F1290 Cannabis use, unspecified, uncomplicated: Secondary | ICD-10-CM

## 2022-04-07 DIAGNOSIS — Z800 Family history of malignant neoplasm of digestive organs: Secondary | ICD-10-CM

## 2022-04-07 DIAGNOSIS — Z8041 Family history of malignant neoplasm of ovary: Secondary | ICD-10-CM | POA: Diagnosis not present

## 2022-04-07 DIAGNOSIS — Z87891 Personal history of nicotine dependence: Secondary | ICD-10-CM | POA: Diagnosis not present

## 2022-04-07 DIAGNOSIS — K219 Gastro-esophageal reflux disease without esophagitis: Secondary | ICD-10-CM | POA: Diagnosis not present

## 2022-04-07 DIAGNOSIS — Z79899 Other long term (current) drug therapy: Secondary | ICD-10-CM | POA: Diagnosis not present

## 2022-04-07 LAB — CMP (CANCER CENTER ONLY)
ALT: 13 U/L (ref 0–44)
AST: 14 U/L — ABNORMAL LOW (ref 15–41)
Albumin: 3.7 g/dL (ref 3.5–5.0)
Alkaline Phosphatase: 56 U/L (ref 38–126)
Anion gap: 2 — ABNORMAL LOW (ref 5–15)
BUN: 23 mg/dL (ref 8–23)
CO2: 34 mmol/L — ABNORMAL HIGH (ref 22–32)
Calcium: 9.5 mg/dL (ref 8.9–10.3)
Chloride: 104 mmol/L (ref 98–111)
Creatinine: 0.77 mg/dL (ref 0.44–1.00)
GFR, Estimated: >60 mL/min (ref >60)
Glucose, Bld: 101 mg/dL — ABNORMAL HIGH (ref 70–99)
Potassium: 4 mmol/L (ref 3.5–5.1)
Sodium: 140 mmol/L (ref 135–145)
Total Bilirubin: 0.2 mg/dL — ABNORMAL LOW (ref 0.3–1.2)
Total Protein: 6.4 g/dL — ABNORMAL LOW (ref 6.5–8.1)

## 2022-04-07 LAB — CBC WITH DIFFERENTIAL (CANCER CENTER ONLY)
Abs Immature Granulocytes: 0.03 10*3/uL (ref 0.00–0.07)
Basophils Absolute: 0.1 10*3/uL (ref 0.0–0.1)
Basophils Relative: 1 %
Eosinophils Absolute: 0.4 10*3/uL (ref 0.0–0.5)
Eosinophils Relative: 3 %
HCT: 34.3 % — ABNORMAL LOW (ref 36.0–46.0)
Hemoglobin: 11.3 g/dL — ABNORMAL LOW (ref 12.0–15.0)
Immature Granulocytes: 0 %
Lymphocytes Relative: 20 %
Lymphs Abs: 2.2 10*3/uL (ref 0.7–4.0)
MCH: 28.2 pg (ref 26.0–34.0)
MCHC: 32.9 g/dL (ref 30.0–36.0)
MCV: 85.5 fL (ref 80.0–100.0)
Monocytes Absolute: 0.7 10*3/uL (ref 0.1–1.0)
Monocytes Relative: 6 %
Neutro Abs: 7.8 10*3/uL — ABNORMAL HIGH (ref 1.7–7.7)
Neutrophils Relative %: 70 %
Platelet Count: 290 10*3/uL (ref 150–400)
RBC: 4.01 MIL/uL (ref 3.87–5.11)
RDW: 16.7 % — ABNORMAL HIGH (ref 11.5–15.5)
WBC Count: 11.2 10*3/uL — ABNORMAL HIGH (ref 4.0–10.5)
nRBC: 0 % (ref 0.0–0.2)

## 2022-04-07 NOTE — Patient Instructions (Signed)
It was nice meeting you.  -There are two main categories of lung cancer, they are named based on the size of the cancer cell. One is called Non-Small cell lung cancer. The other type is Small Cell Lung Cancer -The sample (biopsy) that they took of your tumor was consistent with a subtype of Non-small cell lung cancer called Adenocarcinoma. This is the most common type of lung cancer.  -We covered a lot of important information at your appointment today regarding what the treatment plan is moving forward. Here are the the main points that were discussed at your office visit with [place] today:  -Because there was a positive lymph node and the tumor was invading the lining of the lung, this makes this stage IIB. Therefore, to lower the risk of the cancer coming back, we would recommend treatment. This treatment may be chemotherapy; however, we are going to arrange special tests to see if you are a canidate for treatment that is a pill. That may take 2-3 weeks to come back -We will see you in 3 weeks to review all the options at that time once we know more information based on those special tests.  -In the meantime, we will need a brain MRI to complete the staging workup.   Follow up:  -We will see you back for a follow up visit 3 weeks.  after your first treatment to see how it went and help manage any side effects of treatment that you may have  -If you need to reach [place] at any time, the main office number to the cancer center is [phone], when you call, ask to speak to either Cassie's or Dr. Worthy Flank nurse.

## 2022-04-20 ENCOUNTER — Encounter (HOSPITAL_COMMUNITY): Payer: Self-pay | Admitting: Internal Medicine

## 2022-04-21 ENCOUNTER — Ambulatory Visit (HOSPITAL_COMMUNITY)
Admission: RE | Admit: 2022-04-21 | Discharge: 2022-04-21 | Disposition: A | Discharge status: Home / Self Care | Payer: Medicare Other | Source: Ambulatory Visit | Attending: Physician Assistant | Admitting: Physician Assistant

## 2022-04-21 DIAGNOSIS — C3491 Malignant neoplasm of unspecified part of right bronchus or lung: Secondary | ICD-10-CM | POA: Diagnosis present

## 2022-04-21 MED ORDER — GADOBUTROL 1 MMOL/ML IV SOLN
7.0000 mL | Freq: Once | INTRAVENOUS | Status: AC | PRN
  Administered 2022-04-21: 7 mL via INTRAVENOUS

## 2022-04-22 ENCOUNTER — Encounter (HOSPITAL_COMMUNITY): Payer: Self-pay

## 2022-04-24 NOTE — Progress Notes (Deleted)
[facility] OFFICE PROGRESS NOTE  Andrea Penna, MD [address] [place] [address]  DIAGNOSIS: IIb (T2a, N1, M0) non-small cell lung cancer, adenocarcinoma.  She presented with a right upper lobe nodule.    Molecular Studies ***[id number]  PDL1: 1%   PRIOR THERAPY: right upper lobectomy on 03/01/2022 under the care of Dr. Dorris Fetch. The final pathology showed a 1.3 cm adenocarcinoma with visceral pleural involvement and focal lymphovascular involvement. There is also a positive lymph node.   CURRENT THERAPY: Systemic chemotherapy with Cisplatin ***  and Alimta 500 mg/m2. First dose expected on ***  INTERVAL HISTORY: Andrea Morgan 72 y.o. female returns to the clinic today for a follow up visit. The patient was first seen in the clinic on 04/07/22. The patient underwent RU Lobectomy and was found to have positive lymph node and visceral pleural and focal lymphovascular involvement, Therefore, recommend asdjuvant treatment. She had molecular studies which showed low PDL1 and no actionable mutations in this setting. She is here to discuss adjuvant treatment options.   Since last being seen, denies any changes in her health. Norco and lyrica for post surgical pain. Otherwise she denies any fever, chills, unexplained weight loss, dyspnea on exertion, cough, nausea, vomiting, diarrhea, constipation, headaches, or vision changes.   MEDICAL HISTORY: Past Medical History:  Diagnosis Date   ADHD (attention deficit hyperactivity disorder)    Adrenal adenoma    Anal condyloma    Anemia    Anxiety    Arthritis    arthritis left jaw / pain     Cancer (HCC) 2013   lung cancer   DDD (degenerative disc disease), thoracic    Depression    Dyspnea    with exertion   Dyspnea on exertion    Emphysema/COPD (HCC)    GERD (gastroesophageal reflux disease)    Hiatal hernia    causes no problems per patient   History of adenomatous polyp of colon    06/ 2016 hyperplastic    History of cancer of lower lobe bronchus or lung followed by dr gerhardt-- lov 01/ 2017   06-02-2011  s/p  Right VATS w/ left mini thoracotomy resection 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)   History of condyloma acuminatum    removal anal canal condyloma 11-08-2013   History of kidney stones    passed stones   History of panic attacks    Hyperlipidemia    Hypertension    Hypothyroidism    Insomnia    Liver cyst    per CT in 2012   PAF (paroxysmal atrial fibrillation) [facility]) cardiologist-  dr Patty Sermons   single episode Atrial Fibrillation w/ RVR post-op day 3 the right colectomy surgery on 11-08-2013       Pneumonia    PONV (postoperative nausea and vomiting)    Pulmonary nodule, right    right upper lobe per last Chest CT 05-14-2015    ALLERGIES:  is allergic to oxycontin [oxycodone], sulfa antibiotics, and morphine and related.  MEDICATIONS:  Current Outpatient Medications  Medication Sig Dispense Refill   albuterol (VENTOLIN HFA) 108 (90 Base) MCG/ACT inhaler Inhale 2 puffs into the lungs every 6 (six) hours as needed for wheezing or shortness of breath.     alendronate (FOSAMAX) 70 MG tablet Take 70 mg by mouth See admin instructions. 70 mg once a week on Thursday     ALPRAZolam (XANAX) 1 MG tablet Take 1 mg by mouth 3 (three) times daily as  needed for anxiety.     ASCORBIC ACID PO Take 1 tablet by mouth daily. Vitamin C, unknown strength.     aspirin EC 81 MG tablet Take 81 mg by mouth daily.     atorvastatin (LIPITOR) 20 MG tablet Take 20 mg by mouth at bedtime.      buPROPion (WELLBUTRIN SR) 150 MG 12 hr tablet Take 150 mg by mouth daily.     carvedilol (COREG) 3.125 MG tablet Take 3.125 mg by mouth every morning.     Cholecalciferol (VITAMIN D-3 PO) Take 1 capsule by mouth daily.     Cyanocobalamin (VITAMIN B-12 PO) Take 1 tablet by mouth daily.     desvenlafaxine (PRISTIQ) 50 MG 24 hr tablet Take 50 mg by mouth daily.     diltiazem  (CARDIZEM CD) 120 MG 24 hr capsule TAKE 1 CAPSULE (120 MG TOTAL) BY MOUTH DAILY. 30 capsule 7   donepezil (ARICEPT) 5 MG tablet Take 5 mg by mouth at bedtime.     escitalopram (LEXAPRO) 20 MG tablet Take 20 mg by mouth every evening.   3   feeding supplement (ENSURE ENLIVE / ENSURE PLUS) LIQD Take 237 mLs by mouth 3 (three) times daily between meals. 237 mL 12   HYDROcodone-acetaminophen (NORCO) 7.5-325 MG tablet Take 1 tablet by mouth every 4 (four) hours as needed for severe pain. 15 tablet 0   levothyroxine (SYNTHROID) 88 MCG tablet Take 88 mcg by mouth every morning.     pantoprazole (PROTONIX) 40 MG tablet Take 40 mg by mouth daily.     pregabalin (LYRICA) 25 MG capsule Take 1 capsule (25 mg total) by mouth 2 (two) times daily. 60 capsule 1   Tiotropium Bromide-Olodaterol (STIOLTO RESPIMAT) 2.5-2.5 MCG/ACT AERS Inhale 2 puffs into the lungs daily. 4 g 5   zolpidem (AMBIEN) 10 MG tablet Take 10 mg by mouth at bedtime.      No current facility-administered medications for this visit.    SURGICAL HISTORY:  Past Surgical History:  Procedure Laterality Date   APPENDECTOMY  1978   CERVICAL FUSION  1997   C5 -- C7   COLONOSCOPY  last one 10-17-2014   ESOPHAGOGASTRODUODENOSCOPY  05/21/2011   Procedure: ESOPHAGOGASTRODUODENOSCOPY (EGD);  Surgeon: Theda Belfast, MD;  Location: [facility];  Service: Endoscopy;  Laterality: N/A;   EXAMINATION UNDER ANESTHESIA N/A 11/08/2013   Procedure: ANAL EXAM UNDER ANESTHESIA WITH BIOPSY;  Surgeon: Romie Levee, MD;  Location: [facility];  Service: General;  Laterality: N/A;   INTERCOSTAL NERVE BLOCK Right 03/01/2022   Procedure: INTERCOSTAL NERVE BLOCK;  Surgeon: Loreli Slot, MD;  Location: [facility] OR;  Service: Thoracic;  Laterality: Right;   LAPAROSCOPIC PARTIAL COLECTOMY N/A 11/08/2013   Procedure: laparoscopic partial right colectomy;  Surgeon: Romie Levee, MD;  Location: [facility];  Service: General;  Laterality: N/A;   MASS EXCISION  06/02/2011    Procedure: EXCISION MASS;  Surgeon: Delight Ovens, MD;  Location: [facility];  Service: Thoracic;;  minithoracotomy resection spindle cell tumor right chest   MASS EXCISION N/A 05/06/2016   Procedure: EXCISION ANAL CONDYLOMA;  Surgeon: Romie Levee, MD;  Location: [facility];  Service: General;  Laterality: N/A;   NODE DISSECTION Right 03/01/2022   Procedure: NODE DISSECTION;  Surgeon: Loreli Slot, MD;  Location: [facility] OR;  Service: Thoracic;  Laterality: Right;   THORACIC DISCECTOMY Right 01/13/2016   Procedure: RIGHT THORACIC ONE THORACIC TWO THORACIC LAMINOTOMY AND MICRODISCECTOMY;  Surgeon: Shirlean Kelly, MD;  Location:  [facility];  Service: Neurosurgery;  Laterality: Right;   TONSILLECTOMY  1971   TRANSTHORACIC ECHOCARDIOGRAM  08/28/2014   ef 55-60%/  trivial MR and TR   VIDEO BRONCHOSCOPY  05/11/2011   Procedure: VIDEO BRONCHOSCOPY WITH FLUORO;  Surgeon: [name], MD;  Location: [facility];  Service: Cardiopulmonary;  Laterality: Bilateral;   VIDEO BRONCHOSCOPY  06/02/2011   Procedure: VIDEO BRONCHOSCOPY;  Surgeon: Delight Ovens, MD;  Location: [facility] OR;  Service: Thoracic;  Laterality: N/A;    REVIEW OF SYSTEMS:   Review of Systems  Constitutional: Negative for appetite change, chills, fatigue, fever and unexpected weight change.  HENT:   Negative for mouth sores, nosebleeds, sore throat and trouble swallowing.   Eyes: Negative for eye problems and icterus.  Respiratory: Negative for cough, hemoptysis, shortness of breath and wheezing.   Cardiovascular: Negative for chest pain and leg swelling.  Gastrointestinal: Negative for abdominal pain, constipation, diarrhea, nausea and vomiting.  Genitourinary: Negative for bladder incontinence, difficulty urinating, dysuria, frequency and hematuria.   Musculoskeletal: Negative for back pain, gait problem, neck pain and neck stiffness.  Skin: Negative for itching and rash.  Neurological: Negative for  dizziness, extremity weakness, gait problem, headaches, light-headedness and seizures.  Hematological: Negative for adenopathy. Does not bruise/bleed easily.  Psychiatric/Behavioral: Negative for confusion, depression and sleep disturbance. The patient is not nervous/anxious.     PHYSICAL EXAMINATION:  There were no vitals taken for this visit.  ECOG PERFORMANCE STATUS: {CHL ONC ECOG [id number]  Physical Exam  Constitutional: Oriented to person, place, and time and well-developed, well-nourished, and in no distress. No distress.  HENT:  Head: Normocephalic and atraumatic.  Mouth/Throat: Oropharynx is clear and moist. No oropharyngeal exudate.  Eyes: Conjunctivae are normal. Right eye exhibits no discharge. Left eye exhibits no discharge. No scleral icterus.  Neck: Normal range of motion. Neck supple.  Cardiovascular: Normal rate, regular rhythm, normal heart sounds and intact distal pulses.   Pulmonary/Chest: Effort normal and breath sounds normal. No respiratory distress. No wheezes. No rales.  Abdominal: Soft. Bowel sounds are normal. Exhibits no distension and no mass. There is no tenderness.  Musculoskeletal: Normal range of motion. Exhibits no edema.  Lymphadenopathy:    No cervical adenopathy.  Neurological: Alert and oriented to person, place, and time. Exhibits normal muscle tone. Gait normal. Coordination normal.  Skin: Skin is warm and dry. No rash noted. Not diaphoretic. No erythema. No pallor.  Psychiatric: Mood, memory and judgment normal.  Vitals reviewed.  LABORATORY DATA: Lab Results  Component Value Date   WBC 11.2 (H) 04/07/2022   HGB 11.3 (L) 04/07/2022   HCT 34.3 (L) 04/07/2022   MCV 85.5 04/07/2022   PLT 290 04/07/2022      Chemistry      Component Value Date/Time   NA 140 04/07/2022 1322   K 4.0 04/07/2022 1322   CL 104 04/07/2022 1322   CO2 34 (H) 04/07/2022 1322   BUN 23 04/07/2022 1322   CREATININE 0.77 04/07/2022 1322   CREATININE 0.71  04/23/2013 1443      Component Value Date/Time   CALCIUM 9.5 04/07/2022 1322   ALKPHOS 56 04/07/2022 1322   AST 14 (L) 04/07/2022 1322   ALT 13 04/07/2022 1322   BILITOT 0.2 (L) 04/07/2022 1322       RADIOGRAPHIC STUDIES:  MR Brain W Wo Contrast  Result Date: 04/23/2022 CLINICAL DATA:  Metastatic disease evaluation.  Lung cancer. EXAM: MRI HEAD WITHOUT AND WITH CONTRAST TECHNIQUE: Multiplanar, multiecho pulse  sequences of the brain and surrounding structures were obtained without and with intravenous contrast. CONTRAST:  22mL GADAVIST GADOBUTROL 1 MMOL/ML IV SOLN COMPARISON:  Head MRI 06/05/2021 FINDINGS: Brain: There is no evidence of an acute infarct, intracranial hemorrhage, mass, midline shift, or extra-axial fluid collection. Scattered small T2 hyperintensities in the cerebral white matter bilaterally are unchanged and nonspecific but compatible with mild chronic small vessel ischemic disease. There is mild cerebral atrophy. No abnormal enhancement is identified. Vascular: Major intracranial vascular flow voids are preserved. Skull and upper cervical spine: No suspicious marrow lesion. Sinuses/Orbits: Unremarkable orbits. Paranasal sinuses and mastoid air cells are clear. Other: Unchanged nonspecific right frontal scalp lesion. IMPRESSION: 1. No evidence of intracranial metastases. 2. Mild chronic small vessel ischemic disease. Electronically Signed   By: Sebastian Ache M.D.   On: 04/23/2022 20:07     ASSESSMENT/PLAN:  stage IIb (T2a, N1, M0) non-small cell lung cancer, adenocarcinoma.  She presented with a right upper lobe nodule. She was diagnosed in November 2023. Her PDL1 expression is 1% and she is positive for KRAS G12C mutation which can be used in the metastatic setting second line after chemotherapy.   Patient underwent a right upper lobectomy on 03/01/2022 under the care of Dr. Dorris Fetch. The final pathology showed a 1.3 cm adenocarcinoma with visceral pleural involvement and focal  lymphovascular involvement. There is also a positive lymph node.   Dr. Arbutus Ped had a lengthly discussion with the patient today about her current condition and treatment options. Discussed 4 cycles of adjuvant chemotherapy with   carboplatin/Cisplatin for an AUC of 5, Alimta 500 mg/m IV every 3 weeks.  The patient is interested in proceeding with systemic chemotherapy.  She is expected to start her first dose of this treatment on __.  We discussed the adverse side effects of treatment including but not limited to alopecia, myelosuppression, nausea and vomiting, peripheral neuropathy, liver or renal dysfunction as well as immunotherapy mediated adverse effects.   I will arrange for the patient to have a chemoeducation class prior to receiving her first cycle of chemotherapy.   We will arrange for the patient to have a B12 injection while in the clinic today.     I sent prescriptions for 1 mg folic acid p.o. daily as well as Compazine 10 mg every 6 hours as needed for nausea.   The patient will follow-up in 2 weeks for a one-week follow-up visit after completing her first cycle of chemotherapy.  The patient was advised to call immediately if she has any concerning symptoms in the interval. The patient voices understanding of current disease status and treatment options and is in agreement with the current care plan. All questions were answered. The patient knows to call the clinic with any problems, questions or concerns. We can certainly see the patient much sooner if necessary  No orders of the defined types were placed in this encounter.    I spent {CHL ONC TIME VISIT - NGUWK:[id number]} counseling the patient face to face. The total time spent in the appointment was {CHL ONC TIME VISIT - AHCID:[id number]}.  Kimberla Driskill L Lyle Niblett, PA-C 04/24/22

## 2022-04-26 ENCOUNTER — Other Ambulatory Visit: Payer: Self-pay | Admitting: Physician Assistant

## 2022-04-26 DIAGNOSIS — C3491 Malignant neoplasm of unspecified part of right bronchus or lung: Secondary | ICD-10-CM

## 2022-04-27 ENCOUNTER — Inpatient Hospital Stay: Payer: 59 | Admitting: Physician Assistant

## 2022-04-27 ENCOUNTER — Telehealth: Payer: Self-pay

## 2022-04-27 ENCOUNTER — Inpatient Hospital Stay: Payer: 59

## 2022-04-27 NOTE — Progress Notes (Unsigned)
[facility] OFFICE PROGRESS NOTE  [name], MD [place] [address]  DIAGNOSIS: IIb (T2a, N1, M0) non-small cell lung cancer, adenocarcinoma.  She presented with a right upper lobe nodule.    Molecular Studies [id number]  PDL1: 1%   PRIOR THERAPY: right upper lobectomy on 03/01/2022 under the care of Dr. Roxan Hockey. The final pathology showed a 1.3 cm adenocarcinoma with visceral pleural involvement and focal lymphovascular involvement. There is also a positive lymph node.   CURRENT THERAPY: Systemic chemotherapy with Cisplatin 75 mg/m2  and Alimta 500 mg/m2. First dose expected on 05/06/22  INTERVAL HISTORY: Andrea Morgan 72 y.o. female returns to the clinic today for a follow up visit. The patient was first seen in the clinic on 04/07/22. The patient underwent RU Lobectomy and was found to have positive lymph node and visceral pleural and focal lymphovascular involvement, Therefore, we recommend adjuvant treatment. She had molecular studies which showed low PDL1 of 1% and no actionable mutations in this setting. She is here to discuss adjuvant treatment options.    Since last being seen, denies any changes in her health. She takes norco and lyrica for post surgical pain. She has some stable to slightly improved dyspnea on exertion. She reports her appetite is getting better but she lost weight. She denies any fever, chills, cough, nausea, vomiting, diarrhea, constipation, headaches, or vision changes.   MEDICAL HISTORY: Past Medical History:  Diagnosis Date   ADHD (attention deficit hyperactivity disorder)    Adrenal adenoma    Anal condyloma    Anemia    Anxiety    Arthritis    arthritis left jaw / pain     Cancer ([place]) 2013   lung cancer   DDD (degenerative disc disease), thoracic    Depression    Dyspnea    with exertion   Dyspnea on exertion    Emphysema/COPD (HCC)    GERD (gastroesophageal reflux disease)    Hiatal hernia     causes no problems per patient   History of adenomatous polyp of colon    06/ 2016 hyperplastic   History of cancer of lower lobe bronchus or lung followed by dr gerhardt-- lov 01/ 2017   06-02-2011  s/p  Right VATS w/ left mini thoracotomy resection 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)   History of condyloma acuminatum    removal anal canal condyloma 11-08-2013   History of kidney stones    passed stones   History of panic attacks    Hyperlipidemia    Hypertension    Hypothyroidism    Insomnia    Liver cyst    per CT in 2012   PAF (paroxysmal atrial fibrillation) [facility]) cardiologist-  dr Mare Ferrari   single episode Atrial Fibrillation w/ RVR post-op day 3 the right colectomy surgery on 11-08-2013       Pneumonia    PONV (postoperative nausea and vomiting)    Pulmonary nodule, right    right upper lobe per last Chest CT 05-14-2015    ALLERGIES:  is allergic to oxycontin [oxycodone], sulfa antibiotics, and morphine and related.  MEDICATIONS:  Current Outpatient Medications  Medication Sig Dispense Refill   dexamethasone (DECADRON) 4 MG tablet Please take 1 tablet twice a day the day before, the day of, and the day after chemotherapy 40 tablet 2   folic acid (FOLVITE) 1 MG tablet Take 1 tablet (1 mg total) by mouth daily. 30 tablet 2  prochlorperazine (COMPAZINE) 10 MG tablet Take 1 tablet (10 mg total) by mouth every 6 (six) hours as needed. 30 tablet 2   albuterol (VENTOLIN HFA) 108 (90 Base) MCG/ACT inhaler Inhale 2 puffs into the lungs every 6 (six) hours as needed for wheezing or shortness of breath.     alendronate (FOSAMAX) 70 MG tablet Take 70 mg by mouth See admin instructions. 70 mg once a week on Thursday     ALPRAZolam (XANAX) 1 MG tablet Take 1 mg by mouth 3 (three) times daily as needed for anxiety.     ASCORBIC ACID PO Take 1 tablet by mouth daily. Vitamin C, unknown strength.     aspirin EC 81 MG tablet Take 81 mg by mouth daily.      atorvastatin (LIPITOR) 20 MG tablet Take 20 mg by mouth at bedtime.      buPROPion (WELLBUTRIN SR) 150 MG 12 hr tablet Take 150 mg by mouth daily.     carvedilol (COREG) 3.125 MG tablet Take 3.125 mg by mouth every morning.     Cholecalciferol (VITAMIN D-3 PO) Take 1 capsule by mouth daily.     Cyanocobalamin (VITAMIN B-12 PO) Take 1 tablet by mouth daily.     desvenlafaxine (PRISTIQ) 50 MG 24 hr tablet Take 50 mg by mouth daily.     diltiazem (CARDIZEM CD) 120 MG 24 hr capsule TAKE 1 CAPSULE (120 MG TOTAL) BY MOUTH DAILY. 30 capsule 7   donepezil (ARICEPT) 5 MG tablet Take 5 mg by mouth at bedtime.     escitalopram (LEXAPRO) 20 MG tablet Take 20 mg by mouth every evening.   3   feeding supplement (ENSURE ENLIVE / ENSURE PLUS) LIQD Take 237 mLs by mouth 3 (three) times daily between meals. 237 mL 12   HYDROcodone-acetaminophen (NORCO) 7.5-325 MG tablet Take 1 tablet by mouth every 4 (four) hours as needed for severe pain. 15 tablet 0   levothyroxine (SYNTHROID) 88 MCG tablet Take 88 mcg by mouth every morning.     pantoprazole (PROTONIX) 40 MG tablet Take 40 mg by mouth daily.     pregabalin (LYRICA) 25 MG capsule Take 1 capsule (25 mg total) by mouth 2 (two) times daily. 60 capsule 1   Tiotropium Bromide-Olodaterol (STIOLTO RESPIMAT) 2.5-2.5 MCG/ACT AERS Inhale 2 puffs into the lungs daily. 4 g 5   zolpidem (AMBIEN) 10 MG tablet Take 10 mg by mouth at bedtime.      No current facility-administered medications for this visit.   Facility-Administered Medications Ordered in Other Visits  Medication Dose Route Frequency Provider Last Rate Last Admin   cyanocobalamin (VITAMIN B12) injection 1,000 mcg  1,000 mcg Intramuscular Once Viki Carrera L, PA-C        SURGICAL HISTORY:  Past Surgical History:  Procedure Laterality Date   False Pass   C5 -- C7   COLONOSCOPY  last one 10-17-2014   ESOPHAGOGASTRODUODENOSCOPY  05/21/2011   Procedure:  ESOPHAGOGASTRODUODENOSCOPY (EGD);  Surgeon: Beryle Beams, MD;  Location: [facility];  Service: Endoscopy;  Laterality: N/A;   EXAMINATION UNDER ANESTHESIA N/A 11/08/2013   Procedure: ANAL EXAM UNDER ANESTHESIA WITH BIOPSY;  Surgeon: [name], MD;  Location: [facility];  Service: General;  Laterality: N/A;   INTERCOSTAL NERVE BLOCK Right 03/01/2022   Procedure: INTERCOSTAL NERVE BLOCK;  Surgeon: Melrose Nakayama, MD;  Location: [place];  Service: Thoracic;  Laterality: Right;   LAPAROSCOPIC PARTIAL COLECTOMY N/A 11/08/2013   Procedure:  laparoscopic partial right colectomy;  Surgeon: [name], MD;  Location: [facility];  Service: General;  Laterality: N/A;   MASS EXCISION  06/02/2011   Procedure: EXCISION MASS;  Surgeon: Grace Isaac, MD;  Location: [facility];  Service: Thoracic;;  minithoracotomy resection spindle cell tumor right chest   MASS EXCISION N/A 05/06/2016   Procedure: EXCISION ANAL CONDYLOMA;  Surgeon: [name], MD;  Location: [facility];  Service: General;  Laterality: N/A;   NODE DISSECTION Right 03/01/2022   Procedure: NODE DISSECTION;  Surgeon: Melrose Nakayama, MD;  Location: [facility];  Service: Thoracic;  Laterality: Right;   THORACIC DISCECTOMY Right 01/13/2016   Procedure: RIGHT THORACIC ONE THORACIC TWO THORACIC LAMINOTOMY AND MICRODISCECTOMY;  Surgeon: Jovita Gamma, MD;  Location: [facility];  Service: Neurosurgery;  Laterality: Right;   TONSILLECTOMY  1971   TRANSTHORACIC ECHOCARDIOGRAM  08/28/2014   ef 55-60%/  trivial MR and TR   VIDEO BRONCHOSCOPY  05/11/2011   Procedure: VIDEO BRONCHOSCOPY WITH FLUORO;  Surgeon: Collene Gobble, MD;  Location: [facility];  Service: Cardiopulmonary;  Laterality: Bilateral;   VIDEO BRONCHOSCOPY  06/02/2011   Procedure: VIDEO BRONCHOSCOPY;  Surgeon: Grace Isaac, MD;  Location: [facility];  Service: Thoracic;  Laterality: N/A;    REVIEW OF SYSTEMS:   Review of Systems  Constitutional: Positive for  fatigue and weight loss. Negative for appetite change, chills, fever. HENT:   Negative for mouth sores, nosebleeds, sore throat and trouble swallowing.   Eyes: Negative for eye problems and icterus.  Respiratory: Positive for stable dyspnea on exertion. Negative for cough, hemoptysis,  and wheezing.   Cardiovascular: Negative for chest pain and leg swelling.  Gastrointestinal: Negative for abdominal pain, constipation, diarrhea, nausea and vomiting.  Genitourinary: Negative for bladder incontinence, difficulty urinating, dysuria, frequency and hematuria.   Musculoskeletal: Negative for back pain, gait problem, neck pain and neck stiffness.  Skin: Negative for itching and rash.  Neurological: Negative for dizziness, extremity weakness, gait problem, headaches, light-headedness and seizures.  Hematological: Negative for adenopathy. Does not bruise/bleed easily.  Psychiatric/Behavioral: Negative for confusion, depression and sleep disturbance. The patient is not nervous/anxious.     PHYSICAL EXAMINATION:  Blood pressure 125/62, pulse 64, temperature 97.7 F (36.5 C), temperature source Oral, resp. rate 14, weight 151 lb (68.5 kg), SpO2 99 %.  ECOG PERFORMANCE STATUS: 1  Physical Exam  Constitutional: Oriented to person, place, and time and well-developed, well-nourished, and in no distress.  HENT:  Head: Normocephalic and atraumatic.  Mouth/Throat: Oropharynx is clear and moist. No oropharyngeal exudate.  Eyes: Conjunctivae are normal. Right eye exhibits no discharge. Left eye exhibits no discharge. No scleral icterus.  Neck: Normal range of motion. Neck supple.  Cardiovascular: Normal rate, regular rhythm, normal heart sounds and intact distal pulses.   Pulmonary/Chest: Effort normal and breath sounds normal. No respiratory distress. No wheezes. No rales.  Abdominal: Soft. Bowel sounds are normal. Exhibits no distension and no mass. There is no tenderness.  Musculoskeletal: Normal range  of motion. Exhibits no edema.  Lymphadenopathy:    No cervical adenopathy.  Neurological: Alert and oriented to person, place, and time. Exhibits normal muscle tone. Gait normal. Coordination normal.  Skin: Skin is warm and dry. No rash noted. Not diaphoretic. No erythema. No pallor.  Psychiatric: Mood, memory and judgment normal.  Vitals reviewed.  LABORATORY DATA: Lab Results  Component Value Date   WBC 7.4 04/29/2022   HGB 11.8 (L) 04/29/2022   HCT 35.7 (L)  04/29/2022   MCV 85.0 04/29/2022   PLT 317 04/29/2022      Chemistry      Component Value Date/Time   NA 140 04/29/2022 0908   K 4.2 04/29/2022 0908   CL 106 04/29/2022 0908   CO2 30 04/29/2022 0908   BUN 18 04/29/2022 0908   CREATININE 0.81 04/29/2022 0908   CREATININE 0.71 04/23/2013 1443      Component Value Date/Time   CALCIUM 9.3 04/29/2022 0908   ALKPHOS 52 04/29/2022 0908   AST 14 (L) 04/29/2022 0908   ALT 10 04/29/2022 0908   BILITOT 0.3 04/29/2022 0908       RADIOGRAPHIC STUDIES:  MR Brain W Wo Contrast  Result Date: 04/23/2022 CLINICAL DATA:  Metastatic disease evaluation.  Lung cancer. EXAM: MRI HEAD WITHOUT AND WITH CONTRAST TECHNIQUE: Multiplanar, multiecho pulse sequences of the brain and surrounding structures were obtained without and with intravenous contrast. CONTRAST:  36mL GADAVIST GADOBUTROL 1 MMOL/ML IV SOLN COMPARISON:  Head MRI 06/05/2021 FINDINGS: Brain: There is no evidence of an acute infarct, intracranial hemorrhage, mass, midline shift, or extra-axial fluid collection. Scattered small T2 hyperintensities in the cerebral white matter bilaterally are unchanged and nonspecific but compatible with mild chronic small vessel ischemic disease. There is mild cerebral atrophy. No abnormal enhancement is identified. Vascular: Major intracranial vascular flow voids are preserved. Skull and upper cervical spine: No suspicious marrow lesion. Sinuses/Orbits: Unremarkable orbits. Paranasal sinuses and  mastoid air cells are clear. Other: Unchanged nonspecific right frontal scalp lesion. IMPRESSION: 1. No evidence of intracranial metastases. 2. Mild chronic small vessel ischemic disease. Electronically Signed   By: Logan Bores M.D.   On: 04/23/2022 20:07     ASSESSMENT/PLAN:  stage IIb (T2a, N1, M0) non-small cell lung cancer, adenocarcinoma.  She presented with a right upper lobe nodule. She was diagnosed in November 2023. Her PDL1 expression is 1% and she is positive for KRAS G12C mutation which can be used in the metastatic setting second line after chemotherapy.   Patient underwent a right upper lobectomy on 03/01/2022 under the care of Dr. Roxan Hockey. The final pathology showed a 1.3 cm adenocarcinoma with visceral pleural involvement and focal lymphovascular involvement. There is also a positive lymph node.   Dr. Julien Nordmann had a lengthly discussion with the patient today about her current condition and treatment options. Discussed 4 cycles of adjuvant chemotherapy with   Cisplatin 75 mg/m2 and Alimta 500 mg/m IV every 3 weeks.  The patient is interested in proceeding with systemic chemotherapy.  She is expected to start her first dose of this treatment on 05/06/22  We discussed the adverse side effects of treatment including but not limited to alopecia, myelosuppression, nausea and vomiting, ototoxicity, peripheral neuropathy, liver or renal dysfunction.    I will arrange for the patient to have a chemoeducation class prior to receiving her first cycle of chemotherapy.   We will arrange for the patient to have a B12 injection while in the clinic today.     I sent prescriptions for 1 mg folic acid p.o. daily as well as Compazine 10 mg every 6 hours as needed for nausea.  I sent the patient a prescription of Decadron 1 tablet twice daily the day before, the day after, the day after treatment.  The patient will follow-up in 2 weeks for a one-week follow-up visit after completing her first cycle  of chemotherapy.  I offered a referral to a member of the nutritionist team for the patient's weight loss.  She would like to think about it at this time.  The patient was advised to call immediately if she has any concerning symptoms in the interval. The patient voices understanding of current disease status and treatment options and is in agreement with the current care plan. All questions were answered. The patient knows to call the clinic with any problems, questions or concerns. We can certainly see the patient much sooner if necessary  Orders Placed This Encounter  Procedures   CBC with Differential ([facility] Only)    Standing Status:   Future    Standing Expiration Date:   05/07/2023   CMP ([place] only)    Standing Status:   Future    Standing Expiration Date:   05/07/2023   Magnesium    Standing Status:   Future    Standing Expiration Date:   05/07/2023   CBC with Differential ([place] Only)    Standing Status:   Future    Standing Expiration Date:   05/28/2023   CMP ([place] only)    Standing Status:   Future    Standing Expiration Date:   05/28/2023   Magnesium    Standing Status:   Future    Standing Expiration Date:   05/28/2023   CBC with Differential ([place] Only)    Standing Status:   Future    Standing Expiration Date:   06/18/2023   CMP ([place] only)    Standing Status:   Future    Standing Expiration Date:   06/18/2023   Magnesium    Standing Status:   Future    Standing Expiration Date:   06/18/2023   CBC with Differential ([place] Only)    Standing Status:   Future    Standing Expiration Date:   07/09/2023   CMP ([place] only)    Standing Status:   Future    Standing Expiration Date:   07/09/2023   Magnesium    Standing Status:   Future    Standing Expiration Date:   07/09/2023   CBC with Differential ([place] Only)    Standing Status:   Standing    Number of Occurrences:   12    Standing Expiration Date:    04/30/2023   CMP ([place] only)    Standing Status:   Standing    Number of Occurrences:   12    Standing Expiration Date:   04/30/2023   Magnesium    Standing Status:   Standing    Number of Occurrences:   12    Standing Expiration Date:   04/30/2023      Cong Hightower L Ozie Lupe, PA-C 04/29/22  ADDENDUM: Hematology/Oncology Attending: I had a face-to-face encounter with the patient today.  I reviewed her record, lab, molecular studies and recommended her care plan.  This is a very pleasant 72 years old white female with a stage IIb (T2a, N1, M0) non-small cell lung cancer, adenocarcinoma with positive KRAS G12C mutation and PD-L1 expression of 1%. The patient status post right upper lobectomy with lymph node dissection under the care of Dr. Roxan Hockey on March 01, 2022. I had a lengthy discussion with the patient today about her current condition and treatment options.  In the absence of any actionable mutation in the adjuvant setting, I recommended for the patient to proceed with 4 cycles of adjuvant systemic chemotherapy with cisplatin 75 Mg/M2 and Alimta 500 Mg/M2 every 3 weeks for 4 cycles. She may be a candidate  for adjuvant immunotherapy later on but her PD-L1 expression is only 1% and most of the benefit was seen on the patient with the higher PD-L1 expression. I discussed with the patient the adverse effect of the chemotherapy including but not limited to alopecia, myelosuppression, nausea and vomiting, peripheral neuropathy, liver or renal dysfunction as well as hearing deficit. The patient will receive vitamin B12 injection today. Will call her pharmacy with prescription for Compazine and folic acid as well as Decadron premedication. The patient will come back for follow-up visit in 2 weeks for evaluation and management of any adverse effect of her treatment. She was advised to call immediately if she has any other concerning symptoms in the interval. The total time  spent in the appointment was 30 minutes. Disclaimer: This note was dictated with voice recognition software. Similar sounding words can inadvertently be transcribed and may be missed upon review.

## 2022-04-27 NOTE — Telephone Encounter (Signed)
This nurse reached out to this patient about missed appointment for today.  Patient apologizes and states that she forgot about appointment.  This nurse advised that the schedulers will reach out to reschedule her appointments.  Patient acknowledged understanding.  No further questions or concerns noted.

## 2022-04-28 ENCOUNTER — Ambulatory Visit: Payer: Medicare Other | Admitting: Physician Assistant

## 2022-04-28 ENCOUNTER — Other Ambulatory Visit: Payer: Medicare Other

## 2022-04-29 ENCOUNTER — Inpatient Hospital Stay (HOSPITAL_BASED_OUTPATIENT_CLINIC_OR_DEPARTMENT_OTHER): Payer: 59 | Admitting: Physician Assistant

## 2022-04-29 ENCOUNTER — Inpatient Hospital Stay: Payer: 59 | Attending: Physician Assistant

## 2022-04-29 ENCOUNTER — Inpatient Hospital Stay: Payer: 59

## 2022-04-29 ENCOUNTER — Other Ambulatory Visit: Payer: Self-pay

## 2022-04-29 VITALS — BP 125/62 | HR 64 | Temp 97.7°F | Resp 14 | Wt 151.0 lb

## 2022-04-29 DIAGNOSIS — C779 Secondary and unspecified malignant neoplasm of lymph node, unspecified: Secondary | ICD-10-CM | POA: Diagnosis not present

## 2022-04-29 DIAGNOSIS — C3411 Malignant neoplasm of upper lobe, right bronchus or lung: Secondary | ICD-10-CM | POA: Diagnosis present

## 2022-04-29 DIAGNOSIS — Z5111 Encounter for antineoplastic chemotherapy: Secondary | ICD-10-CM | POA: Insufficient documentation

## 2022-04-29 DIAGNOSIS — C3491 Malignant neoplasm of unspecified part of right bronchus or lung: Secondary | ICD-10-CM

## 2022-04-29 DIAGNOSIS — Z7189 Other specified counseling: Secondary | ICD-10-CM | POA: Insufficient documentation

## 2022-04-29 LAB — CMP (CANCER CENTER ONLY)
ALT: 10 U/L (ref 0–44)
AST: 14 U/L — ABNORMAL LOW (ref 15–41)
Albumin: 4 g/dL (ref 3.5–5.0)
Alkaline Phosphatase: 52 U/L (ref 38–126)
Anion gap: 4 — ABNORMAL LOW (ref 5–15)
BUN: 18 mg/dL (ref 8–23)
CO2: 30 mmol/L (ref 22–32)
Calcium: 9.3 mg/dL (ref 8.9–10.3)
Chloride: 106 mmol/L (ref 98–111)
Creatinine: 0.81 mg/dL (ref 0.44–1.00)
GFR, Estimated: >60 mL/min (ref >60)
Glucose, Bld: 92 mg/dL (ref 70–99)
Potassium: 4.2 mmol/L (ref 3.5–5.1)
Sodium: 140 mmol/L (ref 135–145)
Total Bilirubin: 0.3 mg/dL (ref 0.3–1.2)
Total Protein: 6.5 g/dL (ref 6.5–8.1)

## 2022-04-29 LAB — CBC WITH DIFFERENTIAL (CANCER CENTER ONLY)
Abs Immature Granulocytes: 0.02 10*3/uL (ref 0.00–0.07)
Basophils Absolute: 0.1 10*3/uL (ref 0.0–0.1)
Basophils Relative: 1 %
Eosinophils Absolute: 0.3 10*3/uL (ref 0.0–0.5)
Eosinophils Relative: 4 %
HCT: 35.7 % — ABNORMAL LOW (ref 36.0–46.0)
Hemoglobin: 11.8 g/dL — ABNORMAL LOW (ref 12.0–15.0)
Immature Granulocytes: 0 %
Lymphocytes Relative: 35 %
Lymphs Abs: 2.6 10*3/uL (ref 0.7–4.0)
MCH: 28.1 pg (ref 26.0–34.0)
MCHC: 33.1 g/dL (ref 30.0–36.0)
MCV: 85 fL (ref 80.0–100.0)
Monocytes Absolute: 0.6 10*3/uL (ref 0.1–1.0)
Monocytes Relative: 9 %
Neutro Abs: 3.8 10*3/uL (ref 1.7–7.7)
Neutrophils Relative %: 51 %
Platelet Count: 317 10*3/uL (ref 150–400)
RBC: 4.2 MIL/uL (ref 3.87–5.11)
RDW: 16.1 % — ABNORMAL HIGH (ref 11.5–15.5)
WBC Count: 7.4 10*3/uL (ref 4.0–10.5)
nRBC: 0 % (ref 0.0–0.2)

## 2022-04-29 MED ORDER — DEXAMETHASONE 4 MG PO TABS: ORAL_TABLET | ORAL | 2 refills | Status: DC

## 2022-04-29 MED ORDER — PROCHLORPERAZINE MALEATE 10 MG PO TABS: 10.0000 mg | ORAL_TABLET | Freq: Four times a day (QID) | ORAL | 2 refills | Status: DC | PRN

## 2022-04-29 MED ORDER — CYANOCOBALAMIN 1000 MCG/ML IJ SOLN
1000.0000 ug | Freq: Once | INTRAMUSCULAR | Status: AC
  Administered 2022-04-29: 1000 ug via INTRAMUSCULAR
  Filled 2022-04-29: qty 1

## 2022-04-29 MED ORDER — FOLIC ACID 1 MG PO TABS: 1.0000 mg | ORAL_TABLET | Freq: Every day | ORAL | 2 refills | Status: DC

## 2022-04-29 NOTE — Progress Notes (Signed)
START ON PATHWAY REGIMEN - Non-Small Cell Lung     A cycle is every 21 days:     Pemetrexed      Cisplatin   **Always confirm dose/schedule in your pharmacy ordering system**  Patient Characteristics: Postoperative without Neoadjuvant Therapy (Pathologic Staging), Stage II-III, Adjuvant Chemotherapy, Stage IIB, Nonsquamous Cell Therapeutic Status: Postoperative without Neoadjuvant Therapy (Pathologic Staging) AJCC T Category: pT1a AJCC N Category: pN1 AJCC M Category: cM0 AJCC 8 Stage Grouping: IIB Adjuvant Chemotherapy Status: Adjuvant Chemotherapy ALK Rearrangement Status: Negative EGFR Mutation Status: Negative/Wild Type Histology: Nonsquamous Cell Intent of Therapy: Curative Intent, Discussed with Patient

## 2022-04-29 NOTE — Patient Instructions (Addendum)
Summary:  -There are two main categories of lung cancer, they are named based on the size of the cancer cell. One is called Non-Small cell lung cancer. The other type is Small Cell Lung Cancer -The sample (biopsy) that they took of your tumor was consistent with a subtype of Non-small cell lung cancer called Adenocarcinoma. This is the most common type of lung cancer.  -We covered a lot of important information at your appointment today regarding what the treatment plan is moving forward. Here are the the main points that were discussed at your office visit with [place] today:  -The treatment that you will receive consists of two chemotherapy drugs, called Cisplatin and Alimta (also called Pemetrexed). -We are planning on starting your treatment next week on 05/05/22 but before your start your treatment, I would like you to attend a Chemotherapy Education Class. This involves having you sit down with one of our nurse educators. She will discuss with your one-on-one more details about your treatment as well as general information about resources here at the cancer center.  -Your treatment will be given once every 3 weeks for a total of 4 rounds. We will check your labs once a week -We will get a CT scan after 3 treatments to check on the progress of treatment  Medications:  -I have sent a few important medication prescriptions to your pharmacy.  -Compazine was sent to your pharmacy. This medication is for nausea. You may take this every 6 hours as needed if you feel nauseous.  -I have also sent a prescription for 1 mg of folic acid to your pharmacy. We need you to take 1 tablet every day.  -We will administer vitamin B12 every 9 weeks while you are here in the clinic. You have received your first dose today.  -You will take decadron one tablet twice a day the day before, the day of, and the day after treatment.   Referrals or Imaging:   Follow up:  -We will see you back for a follow up visit 1 week  after your first treatment to see how it went and help manage any side effects of treatment that you may have   -If you need to reach [place] at any time, the main office number to the cancer center is [phone], when you call, ask to speak to either Cassie's or Dr. Worthy Flank nurse.

## 2022-04-30 ENCOUNTER — Encounter: Payer: Self-pay | Admitting: Internal Medicine

## 2022-04-30 ENCOUNTER — Other Ambulatory Visit: Payer: Self-pay

## 2022-04-30 ENCOUNTER — Ambulatory Visit: Payer: 59

## 2022-05-03 ENCOUNTER — Other Ambulatory Visit: Payer: Self-pay | Admitting: Thoracic Surgery (Cardiothoracic Vascular Surgery)

## 2022-05-03 ENCOUNTER — Telehealth: Payer: Self-pay | Admitting: Internal Medicine

## 2022-05-03 ENCOUNTER — Inpatient Hospital Stay: Payer: 59

## 2022-05-03 DIAGNOSIS — C3490 Malignant neoplasm of unspecified part of unspecified bronchus or lung: Secondary | ICD-10-CM

## 2022-05-03 NOTE — Telephone Encounter (Signed)
Scheduled per 01/11 los, patient has been called and notified.

## 2022-05-04 ENCOUNTER — Encounter: Payer: Self-pay | Admitting: Internal Medicine

## 2022-05-04 ENCOUNTER — Ambulatory Visit: Payer: Self-pay | Admitting: Thoracic Surgery (Cardiothoracic Vascular Surgery)

## 2022-05-04 MED ORDER — FOLIC ACID 1 MG PO TABS: 1.0000 mg | ORAL_TABLET | Freq: Every day | ORAL | 3 refills | Status: AC

## 2022-05-04 MED ORDER — CYANOCOBALAMIN 1000 MCG/ML IJ SOLN: 1000.0000 ug | Freq: Once | INTRAMUSCULAR | Status: DC

## 2022-05-04 MED ORDER — PROCHLORPERAZINE MALEATE 10 MG PO TABS: 10.0000 mg | ORAL_TABLET | Freq: Four times a day (QID) | ORAL | 1 refills | Status: DC | PRN

## 2022-05-04 MED ORDER — DEXAMETHASONE 4 MG PO TABS: ORAL_TABLET | ORAL | 1 refills | Status: DC

## 2022-05-04 MED FILL — Fosaprepitant Dimeglumine For IV Infusion 150 MG (Base Eq): INTRAVENOUS | Qty: 5 | Status: AC

## 2022-05-04 MED FILL — Dexamethasone Sodium Phosphate Inj 100 MG/10ML: INTRAMUSCULAR | Qty: 1 | Status: AC

## 2022-05-04 NOTE — Progress Notes (Signed)
Called pt to introduce myself as her Dance movement psychotherapist and to discuss the [employer].  I wanted to leave a msg but she has a vm that hasn't been set up yet.  I will try again another time.

## 2022-05-05 ENCOUNTER — Encounter: Payer: Self-pay | Admitting: Internal Medicine

## 2022-05-05 ENCOUNTER — Inpatient Hospital Stay: Payer: 59

## 2022-05-05 ENCOUNTER — Other Ambulatory Visit: Payer: Self-pay

## 2022-05-05 ENCOUNTER — Other Ambulatory Visit: Payer: Self-pay | Admitting: Physician Assistant

## 2022-05-05 ENCOUNTER — Telehealth: Payer: Self-pay | Admitting: Medical Oncology

## 2022-05-05 VITALS — BP 142/68 | HR 62 | Temp 98.2°F | Resp 18 | Wt 154.7 lb

## 2022-05-05 DIAGNOSIS — C3491 Malignant neoplasm of unspecified part of right bronchus or lung: Secondary | ICD-10-CM

## 2022-05-05 DIAGNOSIS — Z5111 Encounter for antineoplastic chemotherapy: Secondary | ICD-10-CM | POA: Diagnosis not present

## 2022-05-05 DIAGNOSIS — E876 Hypokalemia: Secondary | ICD-10-CM

## 2022-05-05 LAB — CMP (CANCER CENTER ONLY)
ALT: 37 U/L (ref 0–44)
AST: 71 U/L — ABNORMAL HIGH (ref 15–41)
Albumin: 4 g/dL (ref 3.5–5.0)
Alkaline Phosphatase: 59 U/L (ref 38–126)
Anion gap: 9 (ref 5–15)
BUN: 10 mg/dL (ref 8–23)
CO2: 25 mmol/L (ref 22–32)
Calcium: 8.9 mg/dL (ref 8.9–10.3)
Chloride: 103 mmol/L (ref 98–111)
Creatinine: 0.73 mg/dL (ref 0.44–1.00)
GFR, Estimated: >60 mL/min (ref >60)
Glucose, Bld: 172 mg/dL — ABNORMAL HIGH (ref 70–99)
Potassium: 3.1 mmol/L — ABNORMAL LOW (ref 3.5–5.1)
Sodium: 137 mmol/L (ref 135–145)
Total Bilirubin: 0.3 mg/dL (ref 0.3–1.2)
Total Protein: 6.8 g/dL (ref 6.5–8.1)

## 2022-05-05 LAB — CBC WITH DIFFERENTIAL (CANCER CENTER ONLY)
Abs Immature Granulocytes: 0.09 10*3/uL — ABNORMAL HIGH (ref 0.00–0.07)
Basophils Absolute: 0 10*3/uL (ref 0.0–0.1)
Basophils Relative: 0 %
Eosinophils Absolute: 0 10*3/uL (ref 0.0–0.5)
Eosinophils Relative: 0 %
HCT: 35.4 % — ABNORMAL LOW (ref 36.0–46.0)
Hemoglobin: 11.7 g/dL — ABNORMAL LOW (ref 12.0–15.0)
Immature Granulocytes: 1 %
Lymphocytes Relative: 5 %
Lymphs Abs: 0.8 10*3/uL (ref 0.7–4.0)
MCH: 27.9 pg (ref 26.0–34.0)
MCHC: 33.1 g/dL (ref 30.0–36.0)
MCV: 84.3 fL (ref 80.0–100.0)
Monocytes Absolute: 0.2 10*3/uL (ref 0.1–1.0)
Monocytes Relative: 1 %
Neutro Abs: 13.6 10*3/uL — ABNORMAL HIGH (ref 1.7–7.7)
Neutrophils Relative %: 93 %
Platelet Count: 374 10*3/uL (ref 150–400)
RBC: 4.2 MIL/uL (ref 3.87–5.11)
RDW: 16.1 % — ABNORMAL HIGH (ref 11.5–15.5)
WBC Count: 14.8 10*3/uL — ABNORMAL HIGH (ref 4.0–10.5)
nRBC: 0 % (ref 0.0–0.2)

## 2022-05-05 LAB — MAGNESIUM: Magnesium: 2 mg/dL (ref 1.7–2.4)

## 2022-05-05 MED ORDER — SODIUM CHLORIDE 0.9 % IV SOLN
10.0000 mg | Freq: Once | INTRAVENOUS | Status: AC
  Administered 2022-05-05: 10 mg via INTRAVENOUS
  Filled 2022-05-05: qty 10

## 2022-05-05 MED ORDER — PALONOSETRON HCL INJECTION 0.25 MG/5ML
0.2500 mg | Freq: Once | INTRAVENOUS | Status: AC
  Administered 2022-05-05: 0.25 mg via INTRAVENOUS
  Filled 2022-05-05: qty 5

## 2022-05-05 MED ORDER — SODIUM CHLORIDE 0.9 % IV SOLN: Freq: Once | INTRAVENOUS | Status: AC

## 2022-05-05 MED ORDER — SODIUM CHLORIDE 0.9 % IV SOLN
500.0000 mg/m2 | Freq: Once | INTRAVENOUS | Status: AC
  Administered 2022-05-05: 900 mg via INTRAVENOUS
  Filled 2022-05-05: qty 20

## 2022-05-05 MED ORDER — SODIUM CHLORIDE 0.9 % IV SOLN
75.0000 mg/m2 | Freq: Once | INTRAVENOUS | Status: AC
  Administered 2022-05-05: 132 mg via INTRAVENOUS
  Filled 2022-05-05: qty 132

## 2022-05-05 MED ORDER — SODIUM CHLORIDE 0.9 % IV SOLN
150.0000 mg | Freq: Once | INTRAVENOUS | Status: AC
  Administered 2022-05-05: 150 mg via INTRAVENOUS
  Filled 2022-05-05: qty 150

## 2022-05-05 MED ORDER — POTASSIUM CHLORIDE CRYS ER 20 MEQ PO TBCR: 20.0000 meq | EXTENDED_RELEASE_TABLET | Freq: Every day | ORAL | 0 refills | Status: DC

## 2022-05-05 MED ORDER — MAGNESIUM SULFATE 2 GM/50ML IV SOLN
2.0000 g | Freq: Once | INTRAVENOUS | Status: AC
  Administered 2022-05-05: 2 g via INTRAVENOUS
  Filled 2022-05-05: qty 50

## 2022-05-05 MED ORDER — POTASSIUM CHLORIDE IN NACL 20-0.9 MEQ/L-% IV SOLN
Freq: Once | INTRAVENOUS | Status: AC
  Filled 2022-05-05: qty 1000

## 2022-05-05 NOTE — Progress Notes (Signed)
Spoke w/ pt on the phone when she came in the office today to introduce myself as her Dance movement psychotherapist and to discuss the [employer].  Pt would like to apply so she will bring her proof of income on 05/12/22.  If approved I will give her an expense sheet and my card for any questions or concerns she may have in the future.

## 2022-05-05 NOTE — Telephone Encounter (Signed)
-----  Message from Si Gaul, MD sent at 05/05/2022  3:17 PM EST ----- Please call the patient and let her know that her potassium is low and she will need to increase his potassium rich diet and take extra potassium 20 mill equivalent for 7 more days if she is already on potassium supplements.  Thank you ----- Message ----- From: Leory Plowman, Lab In [place] Sent: 05/05/2022   8:33 AM EST To: Si Gaul, MD

## 2022-05-05 NOTE — Telephone Encounter (Signed)
  Pt notified.

## 2022-05-05 NOTE — Progress Notes (Signed)
Ok to use labs from 04/29/22 for first time treatment today per Heilingoetter PA.

## 2022-05-05 NOTE — Patient Instructions (Signed)
[facility]  Discharge Instructions: Thank you for choosing [facility] to provide your oncology and hematology care.   If you have a lab appointment with the Cancer Center, please go directly to the Cancer Center and check in at the registration area.   Wear comfortable clothing and clothing appropriate for easy access to any Portacath or PICC line.   We strive to give you quality time with your provider. You may need to reschedule your appointment if you arrive late (15 or more minutes).  Arriving late affects you and other patients whose appointments are after yours.  Also, if you miss three or more appointments without notifying the office, you may be dismissed from the clinic at the provider's discretion.      For prescription refill requests, have your pharmacy contact our office and allow 72 hours for refills to be completed.    Today you received the following chemotherapy and/or immunotherapy agents cisplatin, alimta      To help prevent nausea and vomiting after your treatment, we encourage you to take your nausea medication as directed.  BELOW ARE SYMPTOMS THAT SHOULD BE REPORTED IMMEDIATELY: *FEVER GREATER THAN 100.4 F (38 C) OR HIGHER *CHILLS OR SWEATING *NAUSEA AND VOMITING THAT IS NOT CONTROLLED WITH YOUR NAUSEA MEDICATION *UNUSUAL SHORTNESS OF BREATH *UNUSUAL BRUISING OR BLEEDING *URINARY PROBLEMS (pain or burning when urinating, or frequent urination) *BOWEL PROBLEMS (unusual diarrhea, constipation, pain near the anus) TENDERNESS IN MOUTH AND THROAT WITH OR WITHOUT PRESENCE OF ULCERS (sore throat, sores in mouth, or a toothache) UNUSUAL RASH, SWELLING OR PAIN  UNUSUAL VAGINAL DISCHARGE OR ITCHING   Items with * indicate a potential emergency and should be followed up as soon as possible or go to the Emergency Department if any problems should occur.  Please show the CHEMOTHERAPY ALERT CARD or IMMUNOTHERAPY ALERT CARD at  check-in to the Emergency Department and triage nurse.  Should you have questions after your visit or need to cancel or reschedule your appointment, please contact [facility]  Dept: [phone]  and follow the prompts.  Office hours are 8:00 a.m. to 4:30 p.m. Monday - Friday. Please note that voicemails left after 4:00 p.m. may not be returned until the following business day.  We are closed weekends and major holidays. You have access to a nurse at all times for urgent questions. Please call the main number to the clinic Dept: [phone] and follow the prompts.   For any non-urgent questions, you may also contact your provider using MyChart. We now offer e-Visits for anyone 9 and older to request care online for non-urgent symptoms. For details visit [email or web address].   Also download the MyChart app! Go to the app store, search "MyChart", open the app, select [facility], and log in with your MyChart username and password.    Cisplatin Injection  What is this medication? CISPLATIN (SIS pla tin) treats some types of cancer. It works by slowing down the growth of cancer cells. This medicine may be used for other purposes; ask your health care provider or pharmacist if you have questions. COMMON BRAND NAME(S): Platinol, Platinol -AQ What should I tell my care team before I take this medication? They need to know if you have any of these conditions: Eye disease, vision problems Hearing problems Kidney disease Low blood counts, such as low white cells, platelets, or red blood cells Tingling of the fingers or toes, or other nerve disorder An unusual  or allergic reaction to cisplatin, carboplatin, oxaliplatin, other medications, foods, dyes, or preservatives If you or your partner are pregnant or trying to get pregnant Breast-feeding How should I use this medication? This medication is injected into a vein. It is given by your care team in a hospital or  clinic setting. Talk to your care team about the use of this medication in children. Special care may be needed. Overdosage: If you think you have taken too much of this medicine contact a poison control center or emergency room at once. NOTE: This medicine is only for you. Do not share this medicine with others. What if I miss a dose? Keep appointments for follow-up doses. It is important not to miss your dose. Call your care team if you are unable to keep an appointment. What may interact with this medication? Do not take this medication with any of the following: Live virus vaccines This medication may also interact with the following: Certain antibiotics, such as amikacin, gentamicin, neomycin, polymyxin B, streptomycin, tobramycin, vancomycin Foscarnet This list may not describe all possible interactions. Give your health care provider a list of all the medicines, herbs, non-prescription drugs, or dietary supplements you use. Also tell them if you smoke, drink alcohol, or use illegal drugs. Some items may interact with your medicine. What should I watch for while using this medication? Your condition will be monitored carefully while you are receiving this medication. You may need blood work done while taking this medication. This medication may make you feel generally unwell. This is not uncommon, as chemotherapy can affect healthy cells as well as cancer cells. Report any side effects. Continue your course of treatment even though you feel ill unless your care team tells you to stop. This medication may increase your risk of getting an infection. Call your care team for advice if you get a fever, chills, sore throat, or other symptoms of a cold or flu. Do not treat yourself. Try to avoid being around people who are sick. Avoid taking medications that contain aspirin, acetaminophen, ibuprofen, naproxen, or ketoprofen unless instructed by your care team. These medications may hide a  fever. This medication may increase your risk to bruise or bleed. Call your care team if you notice any unusual bleeding. Be careful brushing or flossing your teeth or using a toothpick because you may get an infection or bleed more easily. If you have any dental work done, tell your dentist you are receiving this medication. Drink fluids as directed while you are taking this medication. This will help protect your kidneys. Call your care team if you get diarrhea. Do not treat yourself. Talk to your care team if you or your partner wish to become pregnant or think you might be pregnant. This medication can cause serious birth defects if taken during pregnancy and for 14 months after the last dose. A negative pregnancy test is required before starting this medication. A reliable form of contraception is recommended while taking this medication and for 14 months after the last dose. Talk to your care team about effective forms of contraception. Do not father a child while taking this medication and for 11 months after the last dose. Use a condom during sex during this time period. Do not breast-feed while taking this medication. This medication may cause infertility. Talk to your care team if you are concerned about your fertility. What side effects may I notice from receiving this medication? Side effects that you should report to your care  team as soon as possible: Allergic reactions--skin rash, itching, hives, swelling of the face, lips, tongue, or throat Eye pain, change in vision, vision loss Hearing loss, ringing in ears Infection--fever, chills, cough, sore throat, wounds that don't heal, pain or trouble when passing urine, general feeling of discomfort or being unwell Kidney injury--decrease  Pemetrexed Injection  What is this medication? PEMETREXED (PEM e TREX ed) treats some types of cancer. It works by slowing down the growth of cancer cells. This medicine may be used for other purposes;  ask your health care provider or pharmacist if you have questions. COMMON BRAND NAME(S): Alimta, PEMFEXY What should I tell my care team before I take this medication? They need to know if you have any of these conditions: Infection, such as chickenpox, cold sores, or herpes Kidney disease Low blood cell levels (white cells, red cells, and platelets) Lung or breathing disease, such as asthma Radiation therapy An unusual or allergic reaction to pemetrexed, other medications, foods, dyes, or preservatives If you or your partner are pregnant or trying to get pregnant Breast-feeding How should I use this medication? This medication is injected into a vein. It is given by your care team in a hospital or clinic setting. Talk to your care team about the use of this medication in children. Special care may be needed. Overdosage: If you think you have taken too much of this medicine contact a poison control center or emergency room at once. NOTE: This medicine is only for you. Do not share this medicine with others. What if I miss a dose? Keep appointments for follow-up doses. It is important not to miss your dose. Call your care team if you are unable to keep an appointment. What may interact with this medication? Do not take this medication with any of the following: Live virus vaccines This medication may also interact with the following: Ibuprofen This list may not describe all possible interactions. Give your health care provider a list of all the medicines, herbs, non-prescription drugs, or dietary supplements you use. Also tell them if you smoke, drink alcohol, or use illegal drugs. Some items may interact with your medicine. What should I watch for while using this medication? Your condition will be monitored carefully while you are receiving this medication. This medication may make you feel generally unwell. This is not uncommon as chemotherapy can affect healthy cells as well as cancer  cells. Report any side effects. Continue your course of treatment even though you feel ill unless your care team tells you to stop. This medication can cause serious side effects. To reduce the risk, your care team may give you other medications to take before receiving this one. Be sure to follow the directions from your care team. This medication can cause a rash or redness in areas of the body that have previously had radiation therapy. If you have had radiation therapy, tell your care team if you notice a rash in this area. This medication may increase your risk of getting an infection. Call your care team for advice if you get a fever, chills, sore throat, or other symptoms of a cold or flu. Do not treat yourself. Try to avoid being around people who are sick. Be careful brushing or flossing your teeth or using a toothpick because you may get an infection or bleed more easily. If you have any dental work done, tell your dentist you are receiving this medication. Avoid taking medications that contain aspirin, acetaminophen, ibuprofen, naproxen, or  ketoprofen unless instructed by your care team. These medications may hide a fever. Check with your care team if you have severe diarrhea, nausea, and vomiting, or if you sweat a lot. The loss of too much body fluid may make it dangerous for you to take this medication. Talk to your care team if you or your partner wish to become pregnant or think either of you might be pregnant. This medication can cause serious birth defects if taken during pregnancy and for 6 months after the last dose. A negative pregnancy test is required before starting this medication. A reliable form of contraception is recommended while taking this medication and for 6 months after the last dose. Talk to your care team about reliable forms of contraception. Do not father a child while taking this medication and for 3 months after the last dose. Use a condom while having sex during this  time period. Do not breastfeed while taking this medication and for 1 week after the last dose. This medication may cause infertility. Talk to your care team if you are concerned about your fertility. What side effects may I notice from receiving this medication? Side effects that you should report to your care team as soon as possible: Allergic reactions--skin rash, itching, hives, swelling of the face, lips, tongue, or throat Dry cough, shortness of breath or trouble breathing Infection--fever, chills, cough, sore throat, wounds that don't heal, pain or trouble when passing urine, general feeling of discomfort or being unwell Kidney injury--decrease in the amount of urine, swelling of the ankles, hands, or feet Low red blood cell level--unusual weakness or fatigue, dizziness, headache, trouble breathing Redness, blistering, peeling, or loosening of the skin, including inside the mouth Unusual bruising or bleeding Side effects that usually do not require medical attention (report to your care team if they continue or are bothersome): Fatigue Loss of appetite Nausea Vomiting This list may not describe all possible side effects. Call your doctor for medical advice about side effects. You may report side effects to FDA at 1-800-FDA-1088. Where should I keep my medication? This medication is given in a hospital or clinic. It will not be stored at home. NOTE: This sheet is a summary. It may not cover all possible information. If you have questions about this medicine, talk to your doctor, pharmacist, or health care provider.  2023 Elsevier/Gold Standard (2021-08-10 00:00:00)  in the amount of urine, swelling of the ankles, hands, or feet Low red blood cell level--unusual weakness or fatigue, dizziness, headache, trouble breathing Painful swelling, warmth, or redness of the skin, blisters or sores at the infusion site Pain, tingling, or numbness in the hands or feet Unusual bruising or  bleeding Side effects that usually do not require medical attention (report to your care team if they continue or are bothersome): Hair loss Nausea Vomiting This list may not describe all possible side effects. Call your doctor for medical advice about side effects. You may report side effects to FDA at 1-800-FDA-1088. Where should I keep my medication? This medication is given in a hospital or clinic. It will not be stored at home. NOTE: This sheet is a summary. It may not cover all possible information. If you have questions about this medicine, talk to your doctor, pharmacist, or health care provider.  2023 Elsevier/Gold Standard (2021-07-31 00:00:00)

## 2022-05-06 ENCOUNTER — Telehealth: Payer: Self-pay

## 2022-05-06 NOTE — Telephone Encounter (Signed)
-----  Message from Coralee Rud, RN sent at 05/05/2022  3:14 PM EST ----- Regarding: first time treatment call back- mohamed, cisplatin, alimta Patient had treatment today for the first time, she is followed by Dr. Arbutus Ped. She received cisplatin, alimta. She tolerated treatment well with no issues!!

## 2022-05-06 NOTE — Telephone Encounter (Signed)
Andrea Morgan states that she is doing fine.  She is eating, drinking, and urinating well. She knows to call the office at [phone] if she has any questions or concerns.

## 2022-05-10 ENCOUNTER — Telehealth: Payer: Self-pay

## 2022-05-10 NOTE — Telephone Encounter (Signed)
Patient called asking about if she needs a cream for her hands and feet and mouthwash because it was mentioned in education. This LPN asked if her hands and feet are flaking, she states they are not. Also asked if she has developed any mouth sores or irritation to the mouth and she says "no". This LPN recommended if she does develop any side effects to call the office as needed. Patient verbalized understanding. No further questions or concerns.

## 2022-05-12 ENCOUNTER — Other Ambulatory Visit: Payer: Self-pay

## 2022-05-12 ENCOUNTER — Inpatient Hospital Stay: Payer: 59

## 2022-05-12 ENCOUNTER — Inpatient Hospital Stay (HOSPITAL_BASED_OUTPATIENT_CLINIC_OR_DEPARTMENT_OTHER): Payer: 59 | Admitting: Internal Medicine

## 2022-05-12 VITALS — BP 131/52 | HR 71 | Temp 98.1°F | Resp 16 | Wt 150.4 lb

## 2022-05-12 DIAGNOSIS — C3491 Malignant neoplasm of unspecified part of right bronchus or lung: Secondary | ICD-10-CM

## 2022-05-12 DIAGNOSIS — Z5111 Encounter for antineoplastic chemotherapy: Secondary | ICD-10-CM | POA: Diagnosis not present

## 2022-05-12 LAB — CMP (CANCER CENTER ONLY)
ALT: 41 U/L (ref 0–44)
AST: 32 U/L (ref 15–41)
Albumin: 3.7 g/dL (ref 3.5–5.0)
Alkaline Phosphatase: 46 U/L (ref 38–126)
Anion gap: 7 (ref 5–15)
BUN: 19 mg/dL (ref 8–23)
CO2: 28 mmol/L (ref 22–32)
Calcium: 8.4 mg/dL — ABNORMAL LOW (ref 8.9–10.3)
Chloride: 102 mmol/L (ref 98–111)
Creatinine: 0.77 mg/dL (ref 0.44–1.00)
GFR, Estimated: >60 mL/min (ref >60)
Glucose, Bld: 86 mg/dL (ref 70–99)
Potassium: 4.4 mmol/L (ref 3.5–5.1)
Sodium: 137 mmol/L (ref 135–145)
Total Bilirubin: 0.3 mg/dL (ref 0.3–1.2)
Total Protein: 6.6 g/dL (ref 6.5–8.1)

## 2022-05-12 LAB — CBC WITH DIFFERENTIAL (CANCER CENTER ONLY)
Abs Immature Granulocytes: 0.01 10*3/uL (ref 0.00–0.07)
Basophils Absolute: 0 10*3/uL (ref 0.0–0.1)
Basophils Relative: 0 %
Eosinophils Absolute: 0.2 10*3/uL (ref 0.0–0.5)
Eosinophils Relative: 4 %
HCT: 35.3 % — ABNORMAL LOW (ref 36.0–46.0)
Hemoglobin: 11.8 g/dL — ABNORMAL LOW (ref 12.0–15.0)
Immature Granulocytes: 0 %
Lymphocytes Relative: 40 %
Lymphs Abs: 2.1 10*3/uL (ref 0.7–4.0)
MCH: 27.4 pg (ref 26.0–34.0)
MCHC: 33.4 g/dL (ref 30.0–36.0)
MCV: 81.9 fL (ref 80.0–100.0)
Monocytes Absolute: 0.3 10*3/uL (ref 0.1–1.0)
Monocytes Relative: 6 %
Neutro Abs: 2.7 10*3/uL (ref 1.7–7.7)
Neutrophils Relative %: 50 %
Platelet Count: 221 10*3/uL (ref 150–400)
RBC: 4.31 MIL/uL (ref 3.87–5.11)
RDW: 15.4 % (ref 11.5–15.5)
WBC Count: 5.4 10*3/uL (ref 4.0–10.5)
nRBC: 0 % (ref 0.0–0.2)

## 2022-05-12 LAB — MAGNESIUM: Magnesium: 1.8 mg/dL (ref 1.7–2.4)

## 2022-05-12 NOTE — Progress Notes (Signed)
[place] Telephone:(336) [phone]   Fax:(336) [phone]  OFFICE PROGRESS NOTE  [name], MD Andrea Morgan [id number]  DIAGNOSIS: Stage IIb (T1 a, N1, M0) non-small cell lung cancer, adenocarcinoma presented with right upper lobe lung nodule diagnosed in November 2023. Molecular studies showed positive KRAS G12C mutation and PD-L1 expression of 1%.  PRIOR THERAPY: Status post right upper lobectomy with lymph node dissection under the care of Dr. Roxan Hockey on March 01, 2022.  CURRENT THERAPY: Adjuvant systemic chemotherapy with cisplatin 75 Mg/M2 and Alimta 500 Mg/M2.  First dose May 06, 2022.  Status post 1 cycle.  INTERVAL HISTORY: Andrea Morgan 72 y.o. female returns to the clinic today for follow-up visit.  The patient is feeling fine with no concerning complaints except for mild fatigue after the first cycle of her treatment.  She denied having any significant nausea or vomiting.  She has no abdominal pain, diarrhea or constipation.  She denied having any chest pain, shortness of breath, cough or hemoptysis.  She has no fever or chills.  She lost few pounds since her last visit.  She is here today for evaluation and repeat blood work.  MEDICAL HISTORY: Past Medical History:  Diagnosis Date   ADHD (attention deficit hyperactivity disorder)    Adrenal adenoma    Anal condyloma    Anemia    Anxiety    Arthritis    arthritis left jaw / pain     Cancer ([place]) 2013   lung cancer   DDD (degenerative disc disease), thoracic    Depression    Dyspnea    with exertion   Dyspnea on exertion    Emphysema/COPD (HCC)    GERD (gastroesophageal reflux disease)    Hiatal hernia    causes no problems per patient   History of adenomatous polyp of colon    06/ 2016 hyperplastic   History of cancer of lower lobe bronchus or lung followed by dr gerhardt-- lov 01/ 2017   06-02-2011  s/p  Right VATS w/ left mini thoracotomy resection 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)   History of condyloma acuminatum    removal anal canal condyloma 11-08-2013   History of kidney stones    passed stones   History of panic attacks    Hyperlipidemia    Hypertension    Hypothyroidism    Insomnia    Liver cyst    per CT in 2012   PAF (paroxysmal atrial fibrillation) [facility]) cardiologist-  dr Mare Ferrari   single episode Atrial Fibrillation w/ RVR post-op day 3 the right colectomy surgery on 11-08-2013       Pneumonia    PONV (postoperative nausea and vomiting)    Pulmonary nodule, right    right upper lobe per last Chest CT 05-14-2015    ALLERGIES:  is allergic to oxycontin [oxycodone], sulfa antibiotics, and morphine and related.  MEDICATIONS:  Current Outpatient Medications  Medication Sig Dispense Refill   albuterol (VENTOLIN HFA) 108 (90 Base) MCG/ACT inhaler Inhale 2 puffs into the lungs every 6 (six) hours as needed for wheezing or shortness of breath.     alendronate (FOSAMAX) 70 MG tablet Take 70 mg by mouth See admin instructions. 70 mg once a week on Thursday     ALPRAZolam (XANAX) 1 MG tablet Take 1 mg by mouth 3 (three) times daily as needed for anxiety.     ASCORBIC ACID PO Take 1 tablet  by mouth daily. Vitamin C, unknown strength.     aspirin EC 81 MG tablet Take 81 mg by mouth daily.     atorvastatin (LIPITOR) 20 MG tablet Take 20 mg by mouth at bedtime.      buPROPion (WELLBUTRIN SR) 150 MG 12 hr tablet Take 150 mg by mouth daily.     carvedilol (COREG) 3.125 MG tablet Take 3.125 mg by mouth every morning.     Cholecalciferol (VITAMIN D-3 PO) Take 1 capsule by mouth daily.     Cyanocobalamin (VITAMIN B-12 PO) Take 1 tablet by mouth daily.     desvenlafaxine (PRISTIQ) 50 MG 24 hr tablet Take 50 mg by mouth daily.     dexamethasone (DECADRON) 4 MG tablet Please take 1 tablet twice a day the day before, the day of, and the day after chemotherapy 40 tablet 2   dexamethasone (DECADRON) 4 MG  tablet Take 1 tab 2 times daily starting day before pemetrexed. Then take 2 tabs daily x 3 days starting day after cisplatin. Take with food. 30 tablet 1   diltiazem (CARDIZEM CD) 120 MG 24 hr capsule TAKE 1 CAPSULE (120 MG TOTAL) BY MOUTH DAILY. 30 capsule 7   donepezil (ARICEPT) 5 MG tablet Take 5 mg by mouth at bedtime.     escitalopram (LEXAPRO) 20 MG tablet Take 20 mg by mouth every evening.   3   feeding supplement (ENSURE ENLIVE / ENSURE PLUS) LIQD Take 237 mLs by mouth 3 (three) times daily between meals. 539 mL 12   folic acid (FOLVITE) 1 MG tablet Take 1 tablet (1 mg total) by mouth daily. 30 tablet 2   folic acid (FOLVITE) 1 MG tablet Take 1 tablet (1 mg total) by mouth daily. Start 7 days before pemetrexed chemotherapy. Continue until 21 days after pemetrexed completed. 100 tablet 3   HYDROcodone-acetaminophen (NORCO) 7.5-325 MG tablet Take 1 tablet by mouth every 4 (four) hours as needed for severe pain. 15 tablet 0   levothyroxine (SYNTHROID) 88 MCG tablet Take 88 mcg by mouth every morning.     pantoprazole (PROTONIX) 40 MG tablet Take 40 mg by mouth daily.     potassium chloride SA (KLOR-CON M) 20 MEQ tablet Take 1 tablet (20 mEq total) by mouth daily. 7 tablet 0   pregabalin (LYRICA) 25 MG capsule Take 1 capsule (25 mg total) by mouth 2 (two) times daily. 60 capsule 1   prochlorperazine (COMPAZINE) 10 MG tablet Take 1 tablet (10 mg total) by mouth every 6 (six) hours as needed. 30 tablet 2   prochlorperazine (COMPAZINE) 10 MG tablet Take 1 tablet (10 mg total) by mouth every 6 (six) hours as needed for nausea or vomiting. 30 tablet 1   Tiotropium Bromide-Olodaterol (STIOLTO RESPIMAT) 2.5-2.5 MCG/ACT AERS Inhale 2 puffs into the lungs daily. 4 g 5   zolpidem (AMBIEN) 10 MG tablet Take 10 mg by mouth at bedtime.      No current facility-administered medications for this visit.    SURGICAL HISTORY:  Past Surgical History:  Procedure Laterality Date   APPENDECTOMY  1978    CERVICAL FUSION  1997   C5 -- C7   COLONOSCOPY  last one 10-17-2014   ESOPHAGOGASTRODUODENOSCOPY  05/21/2011   Procedure: ESOPHAGOGASTRODUODENOSCOPY (EGD);  Surgeon: Beryle Beams, MD;  Location: [facility];  Service: Endoscopy;  Laterality: N/A;   EXAMINATION UNDER ANESTHESIA N/A 11/08/2013   Procedure: ANAL EXAM UNDER ANESTHESIA WITH BIOPSY;  Surgeon: [name], MD;  Location: [facility];  Service: General;  Laterality: N/A;   INTERCOSTAL NERVE BLOCK Right 03/01/2022   Procedure: INTERCOSTAL NERVE BLOCK;  Surgeon: Melrose Nakayama, MD;  Location: [facility];  Service: Thoracic;  Laterality: Right;   LAPAROSCOPIC PARTIAL COLECTOMY N/A 11/08/2013   Procedure: laparoscopic partial right colectomy;  Surgeon: [name], MD;  Location: [facility];  Service: General;  Laterality: N/A;   MASS EXCISION  06/02/2011   Procedure: EXCISION MASS;  Surgeon: Grace Isaac, MD;  Location: [facility];  Service: Thoracic;;  minithoracotomy resection spindle cell tumor right chest   MASS EXCISION N/A 05/06/2016   Procedure: EXCISION ANAL CONDYLOMA;  Surgeon: [name], MD;  Location: [facility];  Service: General;  Laterality: N/A;   NODE DISSECTION Right 03/01/2022   Procedure: NODE DISSECTION;  Surgeon: Melrose Nakayama, MD;  Location: [facility];  Service: Thoracic;  Laterality: Right;   THORACIC DISCECTOMY Right 01/13/2016   Procedure: RIGHT THORACIC ONE THORACIC TWO THORACIC LAMINOTOMY AND MICRODISCECTOMY;  Surgeon: Jovita Gamma, MD;  Location: [facility];  Service: Neurosurgery;  Laterality: Right;   TONSILLECTOMY  1971   TRANSTHORACIC ECHOCARDIOGRAM  08/28/2014   ef 55-60%/  trivial MR and TR   VIDEO BRONCHOSCOPY  05/11/2011   Procedure: VIDEO BRONCHOSCOPY WITH FLUORO;  Surgeon: Collene Gobble, MD;  Location: [facility];  Service: Cardiopulmonary;  Laterality: Bilateral;   VIDEO BRONCHOSCOPY  06/02/2011   Procedure: VIDEO BRONCHOSCOPY;  Surgeon: Grace Isaac, MD;  Location: [facility];  Service: Thoracic;  Laterality: N/A;    REVIEW OF SYSTEMS:  A comprehensive review of systems was negative except for: Constitutional: positive for fatigue   PHYSICAL EXAMINATION: General appearance: alert, cooperative, fatigued, and no distress Head: Normocephalic, without obvious abnormality, atraumatic Neck: no adenopathy, no JVD, supple, symmetrical, trachea midline, and thyroid not enlarged, symmetric, no tenderness/mass/nodules Lymph nodes: Cervical, supraclavicular, and axillary nodes normal. Resp: clear to auscultation bilaterally Back: symmetric, no curvature. ROM normal. No CVA tenderness. Cardio: regular rate and rhythm, S1, S2 normal, no murmur, click, rub or gallop GI: soft, non-tender; bowel sounds normal; no masses,  no organomegaly Extremities: extremities normal, atraumatic, no cyanosis or edema  ECOG PERFORMANCE STATUS: 1 - Symptomatic but completely ambulatory  Blood pressure (!) 131/52, pulse 71, temperature 98.1 F (36.7 C), temperature source Oral, resp. rate 16, weight 150 lb 6 oz (68.2 kg), SpO2 100 %.  LABORATORY DATA: Lab Results  Component Value Date   WBC 14.8 (H) 05/05/2022   HGB 11.7 (L) 05/05/2022   HCT 35.4 (L) 05/05/2022   MCV 84.3 05/05/2022   PLT 374 05/05/2022      Chemistry      Component Value Date/Time   NA 137 05/05/2022 0816   K 3.1 (L) 05/05/2022 0816   CL 103 05/05/2022 0816   CO2 25 05/05/2022 0816   BUN 10 05/05/2022 0816   CREATININE 0.73 05/05/2022 0816   CREATININE 0.71 04/23/2013 1443      Component Value Date/Time   CALCIUM 8.9 05/05/2022 0816   ALKPHOS 59 05/05/2022 0816   AST 71 (H) 05/05/2022 0816   ALT 37 05/05/2022 0816   BILITOT 0.3 05/05/2022 0816       RADIOGRAPHIC STUDIES: MR Brain W Wo Contrast  Result Date: 04/23/2022 CLINICAL DATA:  Metastatic disease evaluation.  Lung cancer. EXAM: MRI HEAD WITHOUT AND WITH CONTRAST TECHNIQUE: Multiplanar, multiecho pulse sequences of the brain and surrounding  structures were obtained without and with intravenous contrast. CONTRAST:  73mL GADAVIST GADOBUTROL 1  MMOL/ML IV SOLN COMPARISON:  Head MRI 06/05/2021 FINDINGS: Brain: There is no evidence of an acute infarct, intracranial hemorrhage, mass, midline shift, or extra-axial fluid collection. Scattered small T2 hyperintensities in the cerebral white matter bilaterally are unchanged and nonspecific but compatible with mild chronic small vessel ischemic disease. There is mild cerebral atrophy. No abnormal enhancement is identified. Vascular: Major intracranial vascular flow voids are preserved. Skull and upper cervical spine: No suspicious marrow lesion. Sinuses/Orbits: Unremarkable orbits. Paranasal sinuses and mastoid air cells are clear. Other: Unchanged nonspecific right frontal scalp lesion. IMPRESSION: 1. No evidence of intracranial metastases. 2. Mild chronic small vessel ischemic disease. Electronically Signed   By: Logan Bores M.D.   On: 04/23/2022 20:07    ASSESSMENT AND PLAN: This is a very pleasant 72 years old white female with Stage IIb (T1 a, N1, M0) non-small cell lung cancer, adenocarcinoma presented with right upper lobe lung nodule diagnosed in November 2023. Molecular studies showed positive KRAS G12C mutation and PD-L1 expression of 1%.  She is status post right upper lobectomy with lymph node dissection under the care of Dr. Roxan Hockey on March 01, 2022. The patient is currently undergoing treatment with adjuvant systemic chemotherapy with cisplatin 75 Mg/M2 and Alimta 500 Mg/M2 every 3 weeks.  Status post 1 cycle started last week. The patient tolerated the first week of her treatment fairly well except for mild fatigue. Her CBC today is unremarkable except for persistent mild anemia after her surgery. Comprehensive metabolic panel and magnesium are still pending. I recommended for the patient to continue her current treatment with the adjuvant therapy and she is expected to start cycle  #2 in 2 weeks. She was advised to call immediately if she has any other concerning symptoms in the interval. The patient voices understanding of current disease status and treatment options and is in agreement with the current care plan.  All questions were answered. The patient knows to call the clinic with any problems, questions or concerns. We can certainly see the patient much sooner if necessary.  The total time spent in the appointment was 20 minutes.  Disclaimer: This note was dictated with voice recognition software. Similar sounding words can inadvertently be transcribed and may not be corrected upon review.

## 2022-05-13 ENCOUNTER — Other Ambulatory Visit: Payer: Self-pay

## 2022-05-14 ENCOUNTER — Telehealth: Payer: Self-pay | Admitting: Internal Medicine

## 2022-05-14 NOTE — Telephone Encounter (Signed)
Called patient to confirm appointments. Patient notified and mailing patient calendar.

## 2022-05-26 MED FILL — Fosaprepitant Dimeglumine For IV Infusion 150 MG (Base Eq): INTRAVENOUS | Qty: 5 | Status: AC

## 2022-05-26 MED FILL — Dexamethasone Sodium Phosphate Inj 100 MG/10ML: INTRAMUSCULAR | Qty: 1 | Status: AC

## 2022-05-27 ENCOUNTER — Inpatient Hospital Stay: Payer: 59

## 2022-05-27 ENCOUNTER — Ambulatory Visit: Payer: 59 | Admitting: Internal Medicine

## 2022-05-27 ENCOUNTER — Inpatient Hospital Stay: Payer: 59 | Attending: Physician Assistant

## 2022-05-27 ENCOUNTER — Inpatient Hospital Stay (HOSPITAL_BASED_OUTPATIENT_CLINIC_OR_DEPARTMENT_OTHER): Payer: 59 | Admitting: Internal Medicine

## 2022-05-27 ENCOUNTER — Ambulatory Visit: Payer: 59

## 2022-05-27 VITALS — BP 165/74 | HR 82 | Temp 98.2°F | Resp 18

## 2022-05-27 DIAGNOSIS — Z5111 Encounter for antineoplastic chemotherapy: Secondary | ICD-10-CM | POA: Diagnosis present

## 2022-05-27 DIAGNOSIS — Z79899 Other long term (current) drug therapy: Secondary | ICD-10-CM | POA: Diagnosis not present

## 2022-05-27 DIAGNOSIS — C3491 Malignant neoplasm of unspecified part of right bronchus or lung: Secondary | ICD-10-CM

## 2022-05-27 DIAGNOSIS — C3411 Malignant neoplasm of upper lobe, right bronchus or lung: Secondary | ICD-10-CM | POA: Diagnosis present

## 2022-05-27 LAB — CBC WITH DIFFERENTIAL (CANCER CENTER ONLY)
Abs Immature Granulocytes: 0.05 10*3/uL (ref 0.00–0.07)
Basophils Absolute: 0 10*3/uL (ref 0.0–0.1)
Basophils Relative: 1 %
Eosinophils Absolute: 0 10*3/uL (ref 0.0–0.5)
Eosinophils Relative: 0 %
HCT: 34.6 % — ABNORMAL LOW (ref 36.0–46.0)
Hemoglobin: 11.5 g/dL — ABNORMAL LOW (ref 12.0–15.0)
Immature Granulocytes: 2 %
Lymphocytes Relative: 21 %
Lymphs Abs: 0.7 10*3/uL (ref 0.7–4.0)
MCH: 27.4 pg (ref 26.0–34.0)
MCHC: 33.2 g/dL (ref 30.0–36.0)
MCV: 82.6 fL (ref 80.0–100.0)
Monocytes Absolute: 0.1 10*3/uL (ref 0.1–1.0)
Monocytes Relative: 2 %
Neutro Abs: 2.4 10*3/uL (ref 1.7–7.7)
Neutrophils Relative %: 74 %
Platelet Count: 705 10*3/uL — ABNORMAL HIGH (ref 150–400)
RBC: 4.19 MIL/uL (ref 3.87–5.11)
RDW: 17.3 % — ABNORMAL HIGH (ref 11.5–15.5)
WBC Count: 3.2 10*3/uL — ABNORMAL LOW (ref 4.0–10.5)
nRBC: 0 % (ref 0.0–0.2)

## 2022-05-27 LAB — MAGNESIUM: Magnesium: 1.6 mg/dL — ABNORMAL LOW (ref 1.7–2.4)

## 2022-05-27 LAB — CMP (CANCER CENTER ONLY)
ALT: 20 U/L (ref 0–44)
AST: 21 U/L (ref 15–41)
Albumin: 3.8 g/dL (ref 3.5–5.0)
Alkaline Phosphatase: 53 U/L (ref 38–126)
Anion gap: 10 (ref 5–15)
BUN: 15 mg/dL (ref 8–23)
CO2: 24 mmol/L (ref 22–32)
Calcium: 9.4 mg/dL (ref 8.9–10.3)
Chloride: 102 mmol/L (ref 98–111)
Creatinine: 0.86 mg/dL (ref 0.44–1.00)
GFR, Estimated: >60 mL/min (ref >60)
Glucose, Bld: 162 mg/dL — ABNORMAL HIGH (ref 70–99)
Potassium: 3.7 mmol/L (ref 3.5–5.1)
Sodium: 136 mmol/L (ref 135–145)
Total Bilirubin: 0.3 mg/dL (ref 0.3–1.2)
Total Protein: 6.3 g/dL — ABNORMAL LOW (ref 6.5–8.1)

## 2022-05-27 MED ORDER — SODIUM CHLORIDE 0.9 % IV SOLN
150.0000 mg | Freq: Once | INTRAVENOUS | Status: AC
  Administered 2022-05-27: 150 mg via INTRAVENOUS
  Filled 2022-05-27: qty 150

## 2022-05-27 MED ORDER — SODIUM CHLORIDE 0.9% FLUSH: 10.0000 mL | INTRAVENOUS | Status: DC | PRN

## 2022-05-27 MED ORDER — MAGNESIUM SULFATE 2 GM/50ML IV SOLN
2.0000 g | Freq: Once | INTRAVENOUS | Status: AC
  Administered 2022-05-27: 2 g via INTRAVENOUS
  Filled 2022-05-27: qty 50

## 2022-05-27 MED ORDER — SODIUM CHLORIDE 0.9 % IV SOLN
10.0000 mg | Freq: Once | INTRAVENOUS | Status: AC
  Administered 2022-05-27: 10 mg via INTRAVENOUS
  Filled 2022-05-27: qty 10

## 2022-05-27 MED ORDER — SODIUM CHLORIDE 0.9 % IV SOLN
75.0000 mg/m2 | Freq: Once | INTRAVENOUS | Status: AC
  Administered 2022-05-27: 132 mg via INTRAVENOUS
  Filled 2022-05-27: qty 132

## 2022-05-27 MED ORDER — PALONOSETRON HCL INJECTION 0.25 MG/5ML
0.2500 mg | Freq: Once | INTRAVENOUS | Status: AC
  Administered 2022-05-27: 0.25 mg via INTRAVENOUS
  Filled 2022-05-27: qty 5

## 2022-05-27 MED ORDER — POTASSIUM CHLORIDE IN NACL 20-0.9 MEQ/L-% IV SOLN
Freq: Once | INTRAVENOUS | Status: AC
  Filled 2022-05-27: qty 1000

## 2022-05-27 MED ORDER — HEPARIN SOD (PORK) LOCK FLUSH 100 UNIT/ML IV SOLN: 500.0000 [IU] | Freq: Once | INTRAVENOUS | Status: DC | PRN

## 2022-05-27 MED ORDER — SODIUM CHLORIDE 0.9 % IV SOLN
500.0000 mg/m2 | Freq: Once | INTRAVENOUS | Status: AC
  Administered 2022-05-27: 900 mg via INTRAVENOUS
  Filled 2022-05-27: qty 20

## 2022-05-27 MED ORDER — SODIUM CHLORIDE 0.9 % IV SOLN: Freq: Once | INTRAVENOUS | Status: AC

## 2022-05-27 NOTE — Patient Instructions (Signed)
[facility]  Discharge Instructions: Thank you for choosing [facility] to provide your oncology and hematology care.   If you have a lab appointment with the [facility], please go directly to the [facility] and check in at the registration area.   Wear comfortable clothing and clothing appropriate for easy access to any Portacath or PICC line.   We strive to give you quality time with your provider. You may need to reschedule your appointment if you arrive late (15 or more minutes).  Arriving late affects you and other patients whose appointments are after yours.  Also, if you miss three or more appointments without notifying the office, you may be dismissed from the clinic at the provider's discretion.      For prescription refill requests, have your pharmacy contact our office and allow 72 hours for refills to be completed.    Today you received the following chemotherapy and/or immunotherapy agents: Cisplatin and Alimta      To help prevent nausea and vomiting after your treatment, we encourage you to take your nausea medication as directed.  BELOW ARE SYMPTOMS THAT SHOULD BE REPORTED IMMEDIATELY: *FEVER GREATER THAN 100.4 F (38 C) OR HIGHER *CHILLS OR SWEATING *NAUSEA AND VOMITING THAT IS NOT CONTROLLED WITH YOUR NAUSEA MEDICATION *UNUSUAL SHORTNESS OF BREATH *UNUSUAL BRUISING OR BLEEDING *URINARY PROBLEMS (pain or burning when urinating, or frequent urination) *BOWEL PROBLEMS (unusual diarrhea, constipation, pain near the anus) TENDERNESS IN MOUTH AND THROAT WITH OR WITHOUT PRESENCE OF ULCERS (sore throat, sores in mouth, or a toothache) UNUSUAL RASH, SWELLING OR PAIN  UNUSUAL VAGINAL DISCHARGE OR ITCHING   Items with * indicate a potential emergency and should be followed up as soon as possible or go to the Emergency Department if any problems should occur.  Please show the CHEMOTHERAPY ALERT CARD or IMMUNOTHERAPY ALERT  CARD at check-in to the Emergency Department and triage nurse.  Should you have questions after your visit or need to cancel or reschedule your appointment, please contact [facility]  Dept: [phone]  and follow the prompts.  Office hours are 8:00 a.m. to 4:30 p.m. Monday - Friday. Please note that voicemails left after 4:00 p.m. may not be returned until the following business day.  We are closed weekends and major holidays. You have access to a nurse at all times for urgent questions. Please call the main number to the clinic Dept: [phone] and follow the prompts.   For any non-urgent questions, you may also contact your provider using MyChart. We now offer e-Visits for anyone 66 and older to request care online for non-urgent symptoms. For details visit [email or web address].   Also download the MyChart app! Go to the app store, search "MyChart", open the app, select [facility], and log in with your MyChart username and password.

## 2022-05-27 NOTE — Progress Notes (Signed)
OK to treat with Mg 1.6 per Dr. Julien Nordmann.  Angie Fava, RN

## 2022-05-27 NOTE — Progress Notes (Signed)
[place] [phone]   Fax:(336) [phone]  OFFICE PROGRESS NOTE  [name], MD [place] [address]  DIAGNOSIS: Stage IIb (T1 a, N1, M0) non-small cell lung cancer, adenocarcinoma presented with right upper lobe lung nodule diagnosed in November 2023. Molecular studies showed positive KRAS G12C mutation and PD-L1 expression of 1%.  PRIOR THERAPY: Status post right upper lobectomy with lymph node dissection under the care of Dr. Roxan Hockey on March 01, 2022.  CURRENT THERAPY: Adjuvant systemic chemotherapy with cisplatin 75 Mg/M2 and Alimta 500 Mg/M2.  First dose May 06, 2022.  Status post 1 cycle.  INTERVAL HISTORY: Andrea Morgan 72 y.o. female returns to the clinic today for follow-up visit.  The patient is feeling fine today with no concerning complaints except for fatigue.  She denied having any current chest pain, shortness of breath, cough or hemoptysis.  She has no nausea, vomiting, diarrhea or constipation.  She has no headache or visual changes.  She has no recent weight loss or night sweats.  She tolerated the first cycle of her adjuvant treatment fairly well except for the fatigue.  She is here today for evaluation before starting cycle #2.  MEDICAL HISTORY: Past Medical History:  Diagnosis Date   ADHD (attention deficit hyperactivity disorder)    Adrenal adenoma    Anal condyloma    Anemia    Anxiety    Arthritis    arthritis left jaw / pain     Cancer ([place]) 2013   lung cancer   DDD (degenerative disc disease), thoracic    Depression    Dyspnea    with exertion   Dyspnea on exertion    Emphysema/COPD (HCC)    GERD (gastroesophageal reflux disease)    Hiatal hernia    causes no problems per patient   History of adenomatous polyp of colon    06/ 2016 hyperplastic   History of cancer of lower lobe bronchus or lung followed by dr gerhardt-- lov 01/ 2017   06-02-2011  s/p  Right VATS w/ left mini  thoracotomy resection 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)   History of condyloma acuminatum    removal anal canal condyloma 11-08-2013   History of kidney stones    passed stones   History of panic attacks    Hyperlipidemia    Hypertension    Hypothyroidism    Insomnia    Liver cyst    per CT in 2012   PAF (paroxysmal atrial fibrillation) [facility]) cardiologist-  dr Mare Ferrari   single episode Atrial Fibrillation w/ RVR post-op day 3 the right colectomy surgery on 11-08-2013       Pneumonia    PONV (postoperative nausea and vomiting)    Pulmonary nodule, right    right upper lobe per last Chest CT 05-14-2015    ALLERGIES:  is allergic to oxycontin [oxycodone], sulfa antibiotics, and morphine and related.  MEDICATIONS:  Current Outpatient Medications  Medication Sig Dispense Refill   albuterol (VENTOLIN HFA) 108 (90 Base) MCG/ACT inhaler Inhale 2 puffs into the lungs every 6 (six) hours as needed for wheezing or shortness of breath.     alendronate (FOSAMAX) 70 MG tablet Take 70 mg by mouth See admin instructions. 70 mg once a week on Thursday     ALPRAZolam (XANAX) 1 MG tablet Take 1 mg by mouth 3 (three) times daily as needed for anxiety.     ASCORBIC ACID PO  Take 1 tablet by mouth daily. Vitamin C, unknown strength.     aspirin EC 81 MG tablet Take 81 mg by mouth daily.     atorvastatin (LIPITOR) 20 MG tablet Take 20 mg by mouth at bedtime.      buPROPion (WELLBUTRIN SR) 150 MG 12 hr tablet Take 150 mg by mouth daily.     carvedilol (COREG) 3.125 MG tablet Take 3.125 mg by mouth every morning.     Cholecalciferol (VITAMIN D-3 PO) Take 1 capsule by mouth daily.     Cyanocobalamin (VITAMIN B-12 PO) Take 1 tablet by mouth daily.     desvenlafaxine (PRISTIQ) 50 MG 24 hr tablet Take 50 mg by mouth daily.     dexamethasone (DECADRON) 4 MG tablet Please take 1 tablet twice a day the day before, the day of, and the day after chemotherapy 40 tablet 2    dexamethasone (DECADRON) 4 MG tablet Take 1 tab 2 times daily starting day before pemetrexed. Then take 2 tabs daily x 3 days starting day after cisplatin. Take with food. 30 tablet 1   diltiazem (CARDIZEM CD) 120 MG 24 hr capsule TAKE 1 CAPSULE (120 MG TOTAL) BY MOUTH DAILY. 30 capsule 7   donepezil (ARICEPT) 5 MG tablet Take 5 mg by mouth at bedtime.     escitalopram (LEXAPRO) 20 MG tablet Take 20 mg by mouth every evening.   3   feeding supplement (ENSURE ENLIVE / ENSURE PLUS) LIQD Take 237 mLs by mouth 3 (three) times daily between meals. 409 mL 12   folic acid (FOLVITE) 1 MG tablet Take 1 tablet (1 mg total) by mouth daily. 30 tablet 2   folic acid (FOLVITE) 1 MG tablet Take 1 tablet (1 mg total) by mouth daily. Start 7 days before pemetrexed chemotherapy. Continue until 21 days after pemetrexed completed. 100 tablet 3   HYDROcodone-acetaminophen (NORCO) 7.5-325 MG tablet Take 1 tablet by mouth every 4 (four) hours as needed for severe pain. 15 tablet 0   levothyroxine (SYNTHROID) 88 MCG tablet Take 88 mcg by mouth every morning.     pantoprazole (PROTONIX) 40 MG tablet Take 40 mg by mouth daily.     potassium chloride SA (KLOR-CON M) 20 MEQ tablet Take 1 tablet (20 mEq total) by mouth daily. 7 tablet 0   pregabalin (LYRICA) 25 MG capsule Take 1 capsule (25 mg total) by mouth 2 (two) times daily. 60 capsule 1   prochlorperazine (COMPAZINE) 10 MG tablet Take 1 tablet (10 mg total) by mouth every 6 (six) hours as needed. 30 tablet 2   prochlorperazine (COMPAZINE) 10 MG tablet Take 1 tablet (10 mg total) by mouth every 6 (six) hours as needed for nausea or vomiting. 30 tablet 1   Tiotropium Bromide-Olodaterol (STIOLTO RESPIMAT) 2.5-2.5 MCG/ACT AERS Inhale 2 puffs into the lungs daily. 4 g 5   zolpidem (AMBIEN) 10 MG tablet Take 10 mg by mouth at bedtime.      No current facility-administered medications for this visit.    SURGICAL HISTORY:  Past Surgical History:  Procedure Laterality Date    APPENDECTOMY  1978   CERVICAL FUSION  1997   C5 -- C7   COLONOSCOPY  last one 10-17-2014   ESOPHAGOGASTRODUODENOSCOPY  05/21/2011   Procedure: ESOPHAGOGASTRODUODENOSCOPY (EGD);  Surgeon: Beryle Beams, MD;  Location: [facility];  Service: Endoscopy;  Laterality: N/A;   EXAMINATION UNDER ANESTHESIA N/A 11/08/2013   Procedure: ANAL EXAM UNDER ANESTHESIA WITH BIOPSY;  Surgeon: [name], MD;  Location: [facility];  Service: General;  Laterality: N/A;   INTERCOSTAL NERVE BLOCK Right 03/01/2022   Procedure: INTERCOSTAL NERVE BLOCK;  Surgeon: Melrose Nakayama, MD;  Location: [facility];  Service: Thoracic;  Laterality: Right;   LAPAROSCOPIC PARTIAL COLECTOMY N/A 11/08/2013   Procedure: laparoscopic partial right colectomy;  Surgeon: [name], MD;  Location: [facility];  Service: General;  Laterality: N/A;   MASS EXCISION  06/02/2011   Procedure: EXCISION MASS;  Surgeon: Grace Isaac, MD;  Location: [facility];  Service: Thoracic;;  minithoracotomy resection spindle cell tumor right chest   MASS EXCISION N/A 05/06/2016   Procedure: EXCISION ANAL CONDYLOMA;  Surgeon: [name], MD;  Location: [facility];  Service: General;  Laterality: N/A;   NODE DISSECTION Right 03/01/2022   Procedure: NODE DISSECTION;  Surgeon: Melrose Nakayama, MD;  Location: [facility];  Service: Thoracic;  Laterality: Right;   THORACIC DISCECTOMY Right 01/13/2016   Procedure: RIGHT THORACIC ONE THORACIC TWO THORACIC LAMINOTOMY AND MICRODISCECTOMY;  Surgeon: Jovita Gamma, MD;  Location: [facility];  Service: Neurosurgery;  Laterality: Right;   TONSILLECTOMY  1971   TRANSTHORACIC ECHOCARDIOGRAM  08/28/2014   ef 55-60%/  trivial MR and TR   VIDEO BRONCHOSCOPY  05/11/2011   Procedure: VIDEO BRONCHOSCOPY WITH FLUORO;  Surgeon: Collene Gobble, MD;  Location: [facility];  Service: Cardiopulmonary;  Laterality: Bilateral;   VIDEO BRONCHOSCOPY  06/02/2011   Procedure: VIDEO BRONCHOSCOPY;  Surgeon: Grace Isaac, MD;  Location: [facility];  Service: Thoracic;  Laterality: N/A;    REVIEW OF SYSTEMS:  A comprehensive review of systems was negative except for: Constitutional: positive for fatigue   PHYSICAL EXAMINATION: General appearance: alert, cooperative, fatigued, and no distress Head: Normocephalic, without obvious abnormality, atraumatic Neck: no adenopathy, no JVD, supple, symmetrical, trachea midline, and thyroid not enlarged, symmetric, no tenderness/mass/nodules Lymph nodes: Cervical, supraclavicular, and axillary nodes normal. Resp: clear to auscultation bilaterally Back: symmetric, no curvature. ROM normal. No CVA tenderness. Cardio: regular rate and rhythm, S1, S2 normal, no murmur, click, rub or gallop GI: soft, non-tender; bowel sounds normal; no masses,  no organomegaly Extremities: extremities normal, atraumatic, no cyanosis or edema  ECOG PERFORMANCE STATUS: 1 - Symptomatic but completely ambulatory  Blood pressure 124/65, pulse 87, temperature 98.1 F (36.7 C), temperature source Oral, resp. rate 17, weight 152 lb (68.9 kg), SpO2 96 %.  LABORATORY DATA: Lab Results  Component Value Date   WBC 3.2 (L) 05/27/2022   HGB 11.5 (L) 05/27/2022   HCT 34.6 (L) 05/27/2022   MCV 82.6 05/27/2022   PLT 705 (H) 05/27/2022      Chemistry      Component Value Date/Time   NA 137 05/12/2022 1349   K 4.4 05/12/2022 1349   CL 102 05/12/2022 1349   CO2 28 05/12/2022 1349   BUN 19 05/12/2022 1349   CREATININE 0.77 05/12/2022 1349   CREATININE 0.71 04/23/2013 1443      Component Value Date/Time   CALCIUM 8.4 (L) 05/12/2022 1349   ALKPHOS 46 05/12/2022 1349   AST 32 05/12/2022 1349   ALT 41 05/12/2022 1349   BILITOT 0.3 05/12/2022 1349       RADIOGRAPHIC STUDIES: No results found.  ASSESSMENT AND PLAN: This is a very pleasant 72 years old white female with Stage IIb (T1 a, N1, M0) non-small cell lung cancer, adenocarcinoma presented with right upper lobe lung nodule  diagnosed in November 2023. Molecular studies showed positive KRAS G12C mutation and  PD-L1 expression of 1%.  She is status post right upper lobectomy with lymph node dissection under the care of Dr. Roxan Hockey on March 01, 2022. The patient is currently undergoing treatment with adjuvant systemic chemotherapy with cisplatin 75 Mg/M2 and Alimta 500 Mg/M2 every 3 weeks.  Status post 1 cycle. The patient tolerated the first cycle of her treatment well except for the fatigue. I recommended for her to proceed with cycle #2 today as planned. I will see her back for follow-up visit in 3 weeks for evaluation before starting cycle #3. The patient was advised to call immediately if she has any concerning symptoms in the interval. The patient voices understanding of current disease status and treatment options and is in agreement with the current care plan.  All questions were answered. The patient knows to call the clinic with any problems, questions or concerns. We can certainly see the patient much sooner if necessary.  The total time spent in the appointment was 20 minutes.  Disclaimer: This note was dictated with voice recognition software. Similar sounding words can inadvertently be transcribed and may not be corrected upon review.

## 2022-05-28 ENCOUNTER — Ambulatory Visit: Payer: 59

## 2022-06-01 ENCOUNTER — Ambulatory Visit: Payer: 59 | Admitting: Thoracic Surgery (Cardiothoracic Vascular Surgery)

## 2022-06-02 ENCOUNTER — Telehealth: Payer: Self-pay | Admitting: Internal Medicine

## 2022-06-02 NOTE — Telephone Encounter (Signed)
Called patient regarding upcoming February/March appointments. Patient is notified.

## 2022-06-03 ENCOUNTER — Other Ambulatory Visit: Payer: Self-pay

## 2022-06-16 MED FILL — Dexamethasone Sodium Phosphate Inj 100 MG/10ML: INTRAMUSCULAR | Qty: 1 | Status: AC

## 2022-06-16 MED FILL — Fosaprepitant Dimeglumine For IV Infusion 150 MG (Base Eq): INTRAVENOUS | Qty: 5 | Status: AC

## 2022-06-17 ENCOUNTER — Other Ambulatory Visit: Payer: Self-pay

## 2022-06-17 ENCOUNTER — Inpatient Hospital Stay: Payer: 59

## 2022-06-17 ENCOUNTER — Inpatient Hospital Stay (HOSPITAL_BASED_OUTPATIENT_CLINIC_OR_DEPARTMENT_OTHER): Payer: 59 | Admitting: Internal Medicine

## 2022-06-17 DIAGNOSIS — C3491 Malignant neoplasm of unspecified part of right bronchus or lung: Secondary | ICD-10-CM

## 2022-06-17 DIAGNOSIS — Z5111 Encounter for antineoplastic chemotherapy: Secondary | ICD-10-CM | POA: Diagnosis not present

## 2022-06-17 LAB — CMP (CANCER CENTER ONLY)
ALT: 13 U/L (ref 0–44)
AST: 16 U/L (ref 15–41)
Albumin: 4.1 g/dL (ref 3.5–5.0)
Alkaline Phosphatase: 57 U/L (ref 38–126)
Anion gap: 9 (ref 5–15)
BUN: 8 mg/dL (ref 8–23)
CO2: 24 mmol/L (ref 22–32)
Calcium: 8.6 mg/dL — ABNORMAL LOW (ref 8.9–10.3)
Chloride: 107 mmol/L (ref 98–111)
Creatinine: 0.69 mg/dL (ref 0.44–1.00)
GFR, Estimated: >60 mL/min (ref >60)
Glucose, Bld: 104 mg/dL — ABNORMAL HIGH (ref 70–99)
Potassium: 3.3 mmol/L — ABNORMAL LOW (ref 3.5–5.1)
Sodium: 140 mmol/L (ref 135–145)
Total Bilirubin: 0.3 mg/dL (ref 0.3–1.2)
Total Protein: 6.4 g/dL — ABNORMAL LOW (ref 6.5–8.1)

## 2022-06-17 LAB — CBC WITH DIFFERENTIAL (CANCER CENTER ONLY)
Abs Immature Granulocytes: 0.2 10*3/uL — ABNORMAL HIGH (ref 0.00–0.07)
Basophils Absolute: 0 10*3/uL (ref 0.0–0.1)
Basophils Relative: 1 %
Eosinophils Absolute: 0 10*3/uL (ref 0.0–0.5)
Eosinophils Relative: 0 %
HCT: 33.1 % — ABNORMAL LOW (ref 36.0–46.0)
Hemoglobin: 11.2 g/dL — ABNORMAL LOW (ref 12.0–15.0)
Immature Granulocytes: 5 %
Lymphocytes Relative: 27 %
Lymphs Abs: 1.2 10*3/uL (ref 0.7–4.0)
MCH: 28 pg (ref 26.0–34.0)
MCHC: 33.8 g/dL (ref 30.0–36.0)
MCV: 82.8 fL (ref 80.0–100.0)
Monocytes Absolute: 0.8 10*3/uL (ref 0.1–1.0)
Monocytes Relative: 18 %
Neutro Abs: 2.1 10*3/uL (ref 1.7–7.7)
Neutrophils Relative %: 49 %
Platelet Count: 484 10*3/uL — ABNORMAL HIGH (ref 150–400)
RBC: 4 MIL/uL (ref 3.87–5.11)
RDW: 19 % — ABNORMAL HIGH (ref 11.5–15.5)
WBC Count: 4.3 10*3/uL (ref 4.0–10.5)
nRBC: 0 % (ref 0.0–0.2)

## 2022-06-17 LAB — MAGNESIUM: Magnesium: 1.5 mg/dL — ABNORMAL LOW (ref 1.7–2.4)

## 2022-06-17 MED ORDER — MAGNESIUM SULFATE 2 GM/50ML IV SOLN
2.0000 g | Freq: Once | INTRAVENOUS | Status: AC
  Administered 2022-06-17: 2 g via INTRAVENOUS
  Filled 2022-06-17: qty 50

## 2022-06-17 MED ORDER — SODIUM CHLORIDE 0.9 % IV SOLN
150.0000 mg | Freq: Once | INTRAVENOUS | Status: AC
  Administered 2022-06-17: 150 mg via INTRAVENOUS
  Filled 2022-06-17: qty 150

## 2022-06-17 MED ORDER — SODIUM CHLORIDE 0.9 % IV SOLN: Freq: Once | INTRAVENOUS | Status: AC

## 2022-06-17 MED ORDER — SODIUM CHLORIDE 0.9 % IV SOLN
500.0000 mg/m2 | Freq: Once | INTRAVENOUS | Status: AC
  Administered 2022-06-17: 900 mg via INTRAVENOUS
  Filled 2022-06-17: qty 20

## 2022-06-17 MED ORDER — PALONOSETRON HCL INJECTION 0.25 MG/5ML
0.2500 mg | Freq: Once | INTRAVENOUS | Status: AC
  Administered 2022-06-17: 0.25 mg via INTRAVENOUS
  Filled 2022-06-17: qty 5

## 2022-06-17 MED ORDER — SODIUM CHLORIDE 0.9 % IV SOLN
75.0000 mg/m2 | Freq: Once | INTRAVENOUS | Status: AC
  Administered 2022-06-17: 132 mg via INTRAVENOUS
  Filled 2022-06-17: qty 132

## 2022-06-17 MED ORDER — SODIUM CHLORIDE 0.9 % IV SOLN
10.0000 mg | Freq: Once | INTRAVENOUS | Status: AC
  Administered 2022-06-17: 10 mg via INTRAVENOUS
  Filled 2022-06-17: qty 10

## 2022-06-17 MED ORDER — POTASSIUM CHLORIDE IN NACL 20-0.9 MEQ/L-% IV SOLN
Freq: Once | INTRAVENOUS | Status: AC
  Filled 2022-06-17: qty 1000

## 2022-06-17 MED ORDER — CYANOCOBALAMIN 1000 MCG/ML IJ SOLN
1000.0000 ug | Freq: Once | INTRAMUSCULAR | Status: AC
  Administered 2022-06-17: 1000 ug via INTRAMUSCULAR
  Filled 2022-06-17: qty 1

## 2022-06-17 NOTE — Progress Notes (Signed)
[place] Telephone:(336) [phone]   Fax:(336) [phone]  OFFICE PROGRESS NOTE  [name], MD [place] [address]  DIAGNOSIS: Stage IIb (T1 a, N1, M0) non-small cell lung cancer, adenocarcinoma presented with right upper lobe lung nodule diagnosed in November 2023. Molecular studies showed positive KRAS G12C mutation and PD-L1 expression of 1%.  PRIOR THERAPY: Status post right upper lobectomy with lymph node dissection under the care of Dr. Roxan Hockey on March 01, 2022.  CURRENT THERAPY: Adjuvant systemic chemotherapy with cisplatin 75 Mg/M2 and Alimta 500 Mg/M2.  First dose May 06, 2022.  Status post 2 cycles.  INTERVAL HISTORY: Andrea Morgan 72 y.o. female returns to the clinic today for follow-up visit.  The patient is feeling fine today with no concerning complaints except for fatigue that lasted for a day or 2 after her chemotherapy.  She denied having any current nausea, vomiting, diarrhea or constipation.  She has no headache or visual changes.  She has no recent weight loss or night sweats.  She is here today for evaluation before starting cycle #3.  MEDICAL HISTORY: Past Medical History:  Diagnosis Date   ADHD (attention deficit hyperactivity disorder)    Adrenal adenoma    Anal condyloma    Anemia    Anxiety    Arthritis    arthritis left jaw / pain     Cancer ([place]) 2013   lung cancer   DDD (degenerative disc disease), thoracic    Depression    Dyspnea    with exertion   Dyspnea on exertion    Emphysema/COPD (HCC)    GERD (gastroesophageal reflux disease)    Hiatal hernia    causes no problems per patient   History of adenomatous polyp of colon    06/ 2016 hyperplastic   History of cancer of lower lobe bronchus or lung followed by dr gerhardt-- lov 01/ 2017   06-02-2011  s/p  Right VATS w/ left mini thoracotomy resection 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)   History of  condyloma acuminatum    removal anal canal condyloma 11-08-2013   History of kidney stones    passed stones   History of panic attacks    Hyperlipidemia    Hypertension    Hypothyroidism    Insomnia    Liver cyst    per CT in 2012   PAF (paroxysmal atrial fibrillation) [facility]) cardiologist-  dr Mare Ferrari   single episode Atrial Fibrillation w/ RVR post-op day 3 the right colectomy surgery on 11-08-2013       Pneumonia    PONV (postoperative nausea and vomiting)    Pulmonary nodule, right    right upper lobe per last Chest CT 05-14-2015    ALLERGIES:  is allergic to oxycontin [oxycodone], sulfa antibiotics, and morphine and related.  MEDICATIONS:  Current Outpatient Medications  Medication Sig Dispense Refill   albuterol (VENTOLIN HFA) 108 (90 Base) MCG/ACT inhaler Inhale 2 puffs into the lungs every 6 (six) hours as needed for wheezing or shortness of breath.     alendronate (FOSAMAX) 70 MG tablet Take 70 mg by mouth See admin instructions. 70 mg once a week on Thursday     ALPRAZolam (XANAX) 1 MG tablet Take 1 mg by mouth 3 (three) times daily as needed for anxiety.     ASCORBIC ACID PO Take 1 tablet by mouth daily. Vitamin C, unknown strength.     aspirin EC 81 MG  tablet Take 81 mg by mouth daily.     atorvastatin (LIPITOR) 20 MG tablet Take 20 mg by mouth at bedtime.      buPROPion (WELLBUTRIN SR) 150 MG 12 hr tablet Take 150 mg by mouth daily.     carvedilol (COREG) 3.125 MG tablet Take 3.125 mg by mouth every morning.     Cholecalciferol (VITAMIN D-3 PO) Take 1 capsule by mouth daily.     Cyanocobalamin (VITAMIN B-12 PO) Take 1 tablet by mouth daily.     desvenlafaxine (PRISTIQ) 50 MG 24 hr tablet Take 50 mg by mouth daily.     dexamethasone (DECADRON) 4 MG tablet Please take 1 tablet twice a day the day before, the day of, and the day after chemotherapy 40 tablet 2   dexamethasone (DECADRON) 4 MG tablet Take 1 tab 2 times daily starting day before pemetrexed. Then take 2 tabs  daily x 3 days starting day after cisplatin. Take with food. 30 tablet 1   diltiazem (CARDIZEM CD) 120 MG 24 hr capsule TAKE 1 CAPSULE (120 MG TOTAL) BY MOUTH DAILY. 30 capsule 7   donepezil (ARICEPT) 5 MG tablet Take 5 mg by mouth at bedtime.     escitalopram (LEXAPRO) 20 MG tablet Take 20 mg by mouth every evening.   3   feeding supplement (ENSURE ENLIVE / ENSURE PLUS) LIQD Take 237 mLs by mouth 3 (three) times daily between meals. 123XX123 mL 12   folic acid (FOLVITE) 1 MG tablet Take 1 tablet (1 mg total) by mouth daily. 30 tablet 2   folic acid (FOLVITE) 1 MG tablet Take 1 tablet (1 mg total) by mouth daily. Start 7 days before pemetrexed chemotherapy. Continue until 21 days after pemetrexed completed. 100 tablet 3   HYDROcodone-acetaminophen (NORCO) 7.5-325 MG tablet Take 1 tablet by mouth every 4 (four) hours as needed for severe pain. 15 tablet 0   levothyroxine (SYNTHROID) 88 MCG tablet Take 88 mcg by mouth every morning.     pantoprazole (PROTONIX) 40 MG tablet Take 40 mg by mouth daily.     potassium chloride SA (KLOR-CON M) 20 MEQ tablet Take 1 tablet (20 mEq total) by mouth daily. 7 tablet 0   pregabalin (LYRICA) 25 MG capsule Take 1 capsule (25 mg total) by mouth 2 (two) times daily. 60 capsule 1   prochlorperazine (COMPAZINE) 10 MG tablet Take 1 tablet (10 mg total) by mouth every 6 (six) hours as needed. 30 tablet 2   prochlorperazine (COMPAZINE) 10 MG tablet Take 1 tablet (10 mg total) by mouth every 6 (six) hours as needed for nausea or vomiting. 30 tablet 1   Tiotropium Bromide-Olodaterol (STIOLTO RESPIMAT) 2.5-2.5 MCG/ACT AERS Inhale 2 puffs into the lungs daily. 4 g 5   zolpidem (AMBIEN) 10 MG tablet Take 10 mg by mouth at bedtime.      No current facility-administered medications for this visit.    SURGICAL HISTORY:  Past Surgical History:  Procedure Laterality Date   APPENDECTOMY  1978   CERVICAL FUSION  1997   C5 -- C7   COLONOSCOPY  last one 10-17-2014    ESOPHAGOGASTRODUODENOSCOPY  05/21/2011   Procedure: ESOPHAGOGASTRODUODENOSCOPY (EGD);  Surgeon: Beryle Beams, MD;  Location: [facility];  Service: Endoscopy;  Laterality: N/A;   EXAMINATION UNDER ANESTHESIA N/A 11/08/2013   Procedure: ANAL EXAM UNDER ANESTHESIA WITH BIOPSY;  Surgeon: [name], MD;  Location: [facility];  Service: General;  Laterality: N/A;   INTERCOSTAL NERVE BLOCK Right 03/01/2022  Procedure: INTERCOSTAL NERVE BLOCK;  Surgeon: Melrose Nakayama, MD;  Location: [facility];  Service: Thoracic;  Laterality: Right;   LAPAROSCOPIC PARTIAL COLECTOMY N/A 11/08/2013   Procedure: laparoscopic partial right colectomy;  Surgeon: [name], MD;  Location: [facility];  Service: General;  Laterality: N/A;   MASS EXCISION  06/02/2011   Procedure: EXCISION MASS;  Surgeon: Grace Isaac, MD;  Location: [facility];  Service: Thoracic;;  minithoracotomy resection spindle cell tumor right chest   MASS EXCISION N/A 05/06/2016   Procedure: EXCISION ANAL CONDYLOMA;  Surgeon: [name], MD;  Location: [facility];  Service: General;  Laterality: N/A;   NODE DISSECTION Right 03/01/2022   Procedure: NODE DISSECTION;  Surgeon: Melrose Nakayama, MD;  Location: [facility];  Service: Thoracic;  Laterality: Right;   THORACIC DISCECTOMY Right 01/13/2016   Procedure: RIGHT THORACIC ONE THORACIC TWO THORACIC LAMINOTOMY AND MICRODISCECTOMY;  Surgeon: Jovita Gamma, MD;  Location: [facility];  Service: Neurosurgery;  Laterality: Right;   TONSILLECTOMY  1971   TRANSTHORACIC ECHOCARDIOGRAM  08/28/2014   ef 55-60%/  trivial MR and TR   VIDEO BRONCHOSCOPY  05/11/2011   Procedure: VIDEO BRONCHOSCOPY WITH FLUORO;  Surgeon: Collene Gobble, MD;  Location: [facility];  Service: Cardiopulmonary;  Laterality: Bilateral;   VIDEO BRONCHOSCOPY  06/02/2011   Procedure: VIDEO BRONCHOSCOPY;  Surgeon: Grace Isaac, MD;  Location: [facility];  Service: Thoracic;  Laterality: N/A;    REVIEW OF SYSTEMS:  A  comprehensive review of systems was negative except for: Constitutional: positive for fatigue   PHYSICAL EXAMINATION: General appearance: alert, cooperative, fatigued, and no distress Head: Normocephalic, without obvious abnormality, atraumatic Neck: no adenopathy, no JVD, supple, symmetrical, trachea midline, and thyroid not enlarged, symmetric, no tenderness/mass/nodules Lymph nodes: Cervical, supraclavicular, and axillary nodes normal. Resp: clear to auscultation bilaterally Back: symmetric, no curvature. ROM normal. No CVA tenderness. Cardio: regular rate and rhythm, S1, S2 normal, no murmur, click, rub or gallop GI: soft, non-tender; bowel sounds normal; no masses,  no organomegaly Extremities: extremities normal, atraumatic, no cyanosis or edema  ECOG PERFORMANCE STATUS: 1 - Symptomatic but completely ambulatory  Blood pressure (!) 167/84, pulse 100, temperature 98 F (36.7 C), temperature source Oral, resp. rate 16, weight 150 lb 14.4 oz (68.4 kg), SpO2 100 %.  LABORATORY DATA: Lab Results  Component Value Date   WBC 4.3 06/17/2022   HGB 11.2 (L) 06/17/2022   HCT 33.1 (L) 06/17/2022   MCV 82.8 06/17/2022   PLT 484 (H) 06/17/2022      Chemistry      Component Value Date/Time   NA 136 05/27/2022 0805   K 3.7 05/27/2022 0805   CL 102 05/27/2022 0805   CO2 24 05/27/2022 0805   BUN 15 05/27/2022 0805   CREATININE 0.86 05/27/2022 0805   CREATININE 0.71 04/23/2013 1443      Component Value Date/Time   CALCIUM 9.4 05/27/2022 0805   ALKPHOS 53 05/27/2022 0805   AST 21 05/27/2022 0805   ALT 20 05/27/2022 0805   BILITOT 0.3 05/27/2022 0805       RADIOGRAPHIC STUDIES: No results found.  ASSESSMENT AND PLAN: This is a very pleasant 72 years old white female with Stage IIb (T1 a, N1, M0) non-small cell lung cancer, adenocarcinoma presented with right upper lobe lung nodule diagnosed in November 2023. Molecular studies showed positive KRAS G12C mutation and PD-L1  expression of 1%.  She is status post right upper lobectomy with lymph node dissection under  the care of Dr. Roxan Hockey on March 01, 2022. The patient is currently undergoing treatment with adjuvant systemic chemotherapy with cisplatin 75 Mg/M2 and Alimta 500 Mg/M2 every 3 weeks.  Status post 2 cycle. The patient has been tolerating her treatment fairly well except for fatigue that lasted 1 or 2 days after the treatment. I recommended for her to proceed with cycle #3 today as planned. I will see her back for follow-up visit in 3 weeks for evaluation before the last cycle of her adjuvant systemic chemotherapy. The patient was advised to take her blood pressure medication as prescribed and to monitor it closely at home. She was advised to call immediately if she has any other concerning symptoms in the interval. The patient voices understanding of current disease status and treatment options and is in agreement with the current care plan.  All questions were answered. The patient knows to call the clinic with any problems, questions or concerns. We can certainly see the patient much sooner if necessary.  The total time spent in the appointment was 20 minutes.  Disclaimer: This note was dictated with voice recognition software. Similar sounding words can inadvertently be transcribed and may not be corrected upon review.

## 2022-06-29 ENCOUNTER — Ambulatory Visit: Payer: 59 | Admitting: Thoracic Surgery (Cardiothoracic Vascular Surgery)

## 2022-07-07 MED FILL — Dexamethasone Sodium Phosphate Inj 100 MG/10ML: INTRAMUSCULAR | Qty: 1 | Status: AC

## 2022-07-07 MED FILL — Fosaprepitant Dimeglumine For IV Infusion 150 MG (Base Eq): INTRAVENOUS | Qty: 5 | Status: AC

## 2022-07-08 ENCOUNTER — Inpatient Hospital Stay (HOSPITAL_BASED_OUTPATIENT_CLINIC_OR_DEPARTMENT_OTHER): Payer: 59 | Admitting: Internal Medicine

## 2022-07-08 ENCOUNTER — Inpatient Hospital Stay: Payer: 59 | Attending: Physician Assistant

## 2022-07-08 ENCOUNTER — Other Ambulatory Visit: Payer: Self-pay

## 2022-07-08 ENCOUNTER — Encounter: Payer: Self-pay | Admitting: Internal Medicine

## 2022-07-08 ENCOUNTER — Telehealth: Payer: Self-pay | Admitting: Medical Oncology

## 2022-07-08 ENCOUNTER — Inpatient Hospital Stay: Payer: 59

## 2022-07-08 VITALS — BP 146/75 | HR 82 | Temp 98.4°F | Resp 18 | Wt 148.4 lb

## 2022-07-08 DIAGNOSIS — C3491 Malignant neoplasm of unspecified part of right bronchus or lung: Secondary | ICD-10-CM | POA: Diagnosis not present

## 2022-07-08 DIAGNOSIS — C3411 Malignant neoplasm of upper lobe, right bronchus or lung: Secondary | ICD-10-CM | POA: Insufficient documentation

## 2022-07-08 DIAGNOSIS — Z5111 Encounter for antineoplastic chemotherapy: Secondary | ICD-10-CM | POA: Insufficient documentation

## 2022-07-08 LAB — CBC WITH DIFFERENTIAL (CANCER CENTER ONLY)
Abs Immature Granulocytes: 0.17 10*3/uL — ABNORMAL HIGH (ref 0.00–0.07)
Basophils Absolute: 0 10*3/uL (ref 0.0–0.1)
Basophils Relative: 2 %
Eosinophils Absolute: 0 10*3/uL (ref 0.0–0.5)
Eosinophils Relative: 1 %
HCT: 30 % — ABNORMAL LOW (ref 36.0–46.0)
Hemoglobin: 10.5 g/dL — ABNORMAL LOW (ref 12.0–15.0)
Immature Granulocytes: 9 %
Lymphocytes Relative: 38 %
Lymphs Abs: 0.8 10*3/uL (ref 0.7–4.0)
MCH: 29.3 pg (ref 26.0–34.0)
MCHC: 35 g/dL (ref 30.0–36.0)
MCV: 83.8 fL (ref 80.0–100.0)
Monocytes Absolute: 0.1 10*3/uL (ref 0.1–1.0)
Monocytes Relative: 5 %
Neutro Abs: 0.9 10*3/uL — ABNORMAL LOW (ref 1.7–7.7)
Neutrophils Relative %: 45 %
Platelet Count: 606 10*3/uL — ABNORMAL HIGH (ref 150–400)
RBC: 3.58 MIL/uL — ABNORMAL LOW (ref 3.87–5.11)
RDW: 22.3 % — ABNORMAL HIGH (ref 11.5–15.5)
Smear Review: NORMAL
WBC Count: 2 10*3/uL — ABNORMAL LOW (ref 4.0–10.5)
nRBC: 0 % (ref 0.0–0.2)

## 2022-07-08 LAB — MAGNESIUM: Magnesium: 1.7 mg/dL (ref 1.7–2.4)

## 2022-07-08 LAB — CMP (CANCER CENTER ONLY)
ALT: 10 U/L (ref 0–44)
AST: 16 U/L (ref 15–41)
Albumin: 4.1 g/dL (ref 3.5–5.0)
Alkaline Phosphatase: 69 U/L (ref 38–126)
Anion gap: 11 (ref 5–15)
BUN: 9 mg/dL (ref 8–23)
CO2: 23 mmol/L (ref 22–32)
Calcium: 8.6 mg/dL — ABNORMAL LOW (ref 8.9–10.3)
Chloride: 100 mmol/L (ref 98–111)
Creatinine: 0.96 mg/dL (ref 0.44–1.00)
GFR, Estimated: >60 mL/min
Glucose, Bld: 186 mg/dL — ABNORMAL HIGH (ref 70–99)
Potassium: 3.6 mmol/L (ref 3.5–5.1)
Sodium: 134 mmol/L — ABNORMAL LOW (ref 135–145)
Total Bilirubin: 0.4 mg/dL (ref 0.3–1.2)
Total Protein: 6.7 g/dL (ref 6.5–8.1)

## 2022-07-08 NOTE — Progress Notes (Signed)
[facility] [phone]   [phone]  OFFICE PROGRESS NOTE  [name], MD [place] [address]  DIAGNOSIS: Stage IIb (T1 a, N1, M0) non-small cell lung cancer, adenocarcinoma presented with right upper lobe lung nodule diagnosed in November 2023. Molecular studies showed positive KRAS G12C mutation and PD-L1 expression of 1%.  PRIOR THERAPY: Status post right upper lobectomy with lymph node dissection under the care of Dr. Roxan Hockey on March 01, 2022.  CURRENT THERAPY: Adjuvant systemic chemotherapy with cisplatin 75 Mg/M2 and Alimta 500 Mg/M2.  First dose May 06, 2022.  Status post 3  cycles.  INTERVAL HISTORY: Andrea Morgan 72 y.o. female returns to the clinic today for follow-up visit.  The patient is feeling fine today with no concerning complaints.  She denied having any current chest pain, shortness of breath, cough or hemoptysis.  She has no nausea, vomiting, diarrhea or constipation.  She has no headache or visual changes.  She has been tolerating her treatment with adjuvant systemic chemotherapy fairly well.  She is here today for evaluation before starting cycle #4.   MEDICAL HISTORY: Past Medical History:  Diagnosis Date   ADHD (attention deficit hyperactivity disorder)    Adrenal adenoma    Anal condyloma    Anemia    Anxiety    Arthritis    arthritis left jaw / pain     Cancer ([place]) 2013   lung cancer   DDD (degenerative disc disease), thoracic    Depression    Dyspnea    with exertion   Dyspnea on exertion    Emphysema/COPD (HCC)    GERD (gastroesophageal reflux disease)    Hiatal hernia    causes no problems per patient   History of adenomatous polyp of colon    06/ 2016 hyperplastic   History of cancer of lower lobe bronchus or lung followed by dr gerhardt-- lov 01/ 2017   06-02-2011  s/p  Right VATS w/ left mini thoracotomy resection 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)   History of condyloma acuminatum    removal anal canal condyloma 11-08-2013   History of kidney stones    passed stones   History of panic attacks    Hyperlipidemia    Hypertension    Hypothyroidism    Insomnia    Liver cyst    per CT in 2012   PAF (paroxysmal atrial fibrillation) [facility]) cardiologist-  dr Mare Ferrari   single episode Atrial Fibrillation w/ RVR post-op day 3 the right colectomy surgery on 11-08-2013       Pneumonia    PONV (postoperative nausea and vomiting)    Pulmonary nodule, right    right upper lobe per last Chest CT 05-14-2015    ALLERGIES:  is allergic to oxycontin [oxycodone], sulfa antibiotics, and morphine and related.  MEDICATIONS:  Current Outpatient Medications  Medication Sig Dispense Refill   albuterol (VENTOLIN HFA) 108 (90 Base) MCG/ACT inhaler Inhale 2 puffs into the lungs every 6 (six) hours as needed for wheezing or shortness of breath.     alendronate (FOSAMAX) 70 MG tablet Take 70 mg by mouth See admin instructions. 70 mg once a week on Thursday     ALPRAZolam (XANAX) 1 MG tablet Take 1 mg by mouth 3 (three) times daily as needed for anxiety.     ASCORBIC ACID PO Take 1 tablet by mouth daily. Vitamin C, unknown strength.  aspirin EC 81 MG tablet Take 81 mg by mouth daily.     atorvastatin (LIPITOR) 20 MG tablet Take 20 mg by mouth at bedtime.      buPROPion (WELLBUTRIN SR) 150 MG 12 hr tablet Take 150 mg by mouth daily.     carvedilol (COREG) 3.125 MG tablet Take 3.125 mg by mouth every morning.     Cholecalciferol (VITAMIN D-3 PO) Take 1 capsule by mouth daily.     Cyanocobalamin (VITAMIN B-12 PO) Take 1 tablet by mouth daily.     desvenlafaxine (PRISTIQ) 50 MG 24 hr tablet Take 50 mg by mouth daily.     dexamethasone (DECADRON) 4 MG tablet Please take 1 tablet twice a day the day before, the day of, and the day after chemotherapy 40 tablet 2   dexamethasone (DECADRON) 4 MG tablet Take 1 tab 2 times daily starting day  before pemetrexed. Then take 2 tabs daily x 3 days starting day after cisplatin. Take with food. 30 tablet 1   diltiazem (CARDIZEM CD) 120 MG 24 hr capsule TAKE 1 CAPSULE (120 MG TOTAL) BY MOUTH DAILY. 30 capsule 7   donepezil (ARICEPT) 5 MG tablet Take 5 mg by mouth at bedtime.     escitalopram (LEXAPRO) 20 MG tablet Take 20 mg by mouth every evening.   3   feeding supplement (ENSURE ENLIVE / ENSURE PLUS) LIQD Take 237 mLs by mouth 3 (three) times daily between meals. 123XX123 mL 12   folic acid (FOLVITE) 1 MG tablet Take 1 tablet (1 mg total) by mouth daily. 30 tablet 2   folic acid (FOLVITE) 1 MG tablet Take 1 tablet (1 mg total) by mouth daily. Start 7 days before pemetrexed chemotherapy. Continue until 21 days after pemetrexed completed. 100 tablet 3   HYDROcodone-acetaminophen (NORCO) 7.5-325 MG tablet Take 1 tablet by mouth every 4 (four) hours as needed for severe pain. 15 tablet 0   levothyroxine (SYNTHROID) 88 MCG tablet Take 88 mcg by mouth every morning.     pantoprazole (PROTONIX) 40 MG tablet Take 40 mg by mouth daily.     potassium chloride SA (KLOR-CON M) 20 MEQ tablet Take 1 tablet (20 mEq total) by mouth daily. 7 tablet 0   pregabalin (LYRICA) 25 MG capsule Take 1 capsule (25 mg total) by mouth 2 (two) times daily. 60 capsule 1   prochlorperazine (COMPAZINE) 10 MG tablet Take 1 tablet (10 mg total) by mouth every 6 (six) hours as needed. 30 tablet 2   prochlorperazine (COMPAZINE) 10 MG tablet Take 1 tablet (10 mg total) by mouth every 6 (six) hours as needed for nausea or vomiting. 30 tablet 1   SYMBICORT 160-4.5 MCG/ACT inhaler Inhale 1 puff into the lungs 2 (two) times daily.     Tiotropium Bromide-Olodaterol (STIOLTO RESPIMAT) 2.5-2.5 MCG/ACT AERS Inhale 2 puffs into the lungs daily. 4 g 5   zolpidem (AMBIEN) 10 MG tablet Take 10 mg by mouth at bedtime.      No current facility-administered medications for this visit.    SURGICAL HISTORY:  Past Surgical History:  Procedure  Laterality Date   APPENDECTOMY  1978   CERVICAL FUSION  1997   C5 -- C7   COLONOSCOPY  last one 10-17-2014   ESOPHAGOGASTRODUODENOSCOPY  05/21/2011   Procedure: ESOPHAGOGASTRODUODENOSCOPY (EGD);  Surgeon: Beryle Beams, MD;  Location: [facility];  Service: Endoscopy;  Laterality: N/A;   EXAMINATION UNDER ANESTHESIA N/A 11/08/2013   Procedure: ANAL EXAM UNDER ANESTHESIA WITH BIOPSY;  Surgeon:  [name], MD;  Location: [facility];  Service: General;  Laterality: N/A;   INTERCOSTAL NERVE BLOCK Right 03/01/2022   Procedure: INTERCOSTAL NERVE BLOCK;  Surgeon: Melrose Nakayama, MD;  Location: [facility];  Service: Thoracic;  Laterality: Right;   LAPAROSCOPIC PARTIAL COLECTOMY N/A 11/08/2013   Procedure: laparoscopic partial right colectomy;  Surgeon: [name], MD;  Location: [facility];  Service: General;  Laterality: N/A;   MASS EXCISION  06/02/2011   Procedure: EXCISION MASS;  Surgeon: Grace Isaac, MD;  Location: [facility];  Service: Thoracic;;  minithoracotomy resection spindle cell tumor right chest   MASS EXCISION N/A 05/06/2016   Procedure: EXCISION ANAL CONDYLOMA;  Surgeon: [name], MD;  Location: [facility];  Service: General;  Laterality: N/A;   NODE DISSECTION Right 03/01/2022   Procedure: NODE DISSECTION;  Surgeon: Melrose Nakayama, MD;  Location: [facility];  Service: Thoracic;  Laterality: Right;   THORACIC DISCECTOMY Right 01/13/2016   Procedure: RIGHT THORACIC ONE THORACIC TWO THORACIC LAMINOTOMY AND MICRODISCECTOMY;  Surgeon: Jovita Gamma, MD;  Location: [facility];  Service: Neurosurgery;  Laterality: Right;   TONSILLECTOMY  1971   TRANSTHORACIC ECHOCARDIOGRAM  08/28/2014   ef 55-60%/  trivial MR and TR   VIDEO BRONCHOSCOPY  05/11/2011   Procedure: VIDEO BRONCHOSCOPY WITH FLUORO;  Surgeon: Collene Gobble, MD;  Location: [facility];  Service: Cardiopulmonary;  Laterality: Bilateral;   VIDEO BRONCHOSCOPY  06/02/2011   Procedure: VIDEO BRONCHOSCOPY;   Surgeon: Grace Isaac, MD;  Location: [facility];  Service: Thoracic;  Laterality: N/A;    REVIEW OF SYSTEMS:  A comprehensive review of systems was negative except for: Constitutional: positive for fatigue   PHYSICAL EXAMINATION: General appearance: alert, cooperative, fatigued, and no distress Head: Normocephalic, without obvious abnormality, atraumatic Neck: no adenopathy, no JVD, supple, symmetrical, trachea midline, and thyroid not enlarged, symmetric, no tenderness/mass/nodules Lymph nodes: Cervical, supraclavicular, and axillary nodes normal. Resp: clear to auscultation bilaterally Back: symmetric, no curvature. ROM normal. No CVA tenderness. Cardio: regular rate and rhythm, S1, S2 normal, no murmur, click, rub or gallop GI: soft, non-tender; bowel sounds normal; no masses,  no organomegaly Extremities: extremities normal, atraumatic, no cyanosis or edema  ECOG PERFORMANCE STATUS: 1 - Symptomatic but completely ambulatory  Blood pressure (!) 146/75, pulse 82, temperature 98.4 F (36.9 C), temperature source Oral, resp. rate 18, weight 148 lb 7 oz (67.3 kg), SpO2 99 %.  LABORATORY DATA: Lab Results  Component Value Date   WBC 2.0 (L) 07/08/2022   HGB 10.5 (L) 07/08/2022   HCT 30.0 (L) 07/08/2022   MCV 83.8 07/08/2022   PLT 606 (H) 07/08/2022      Chemistry      Component Value Date/Time   NA 134 (L) 07/08/2022 0921   K 3.6 07/08/2022 0921   CL 100 07/08/2022 0921   CO2 23 07/08/2022 0921   BUN 9 07/08/2022 0921   CREATININE 0.96 07/08/2022 0921   CREATININE 0.71 04/23/2013 1443      Component Value Date/Time   CALCIUM 8.6 (L) 07/08/2022 0921   ALKPHOS 69 07/08/2022 0921   AST 16 07/08/2022 0921   ALT 10 07/08/2022 0921   BILITOT 0.4 07/08/2022 0921       RADIOGRAPHIC STUDIES: No results found.  ASSESSMENT AND PLAN: This is a very pleasant 72 years old white female with Stage IIb (T1 a, N1, M0) non-small cell lung cancer, adenocarcinoma presented with right  upper lobe lung nodule diagnosed in November 2023.  Molecular studies showed positive KRAS G12C mutation and PD-L1 expression of 1%.  She is status post right upper lobectomy with lymph node dissection under the care of Dr. Roxan Hockey on March 01, 2022. The patient is currently undergoing treatment with adjuvant systemic chemotherapy with cisplatin 75 Mg/M2 and Alimta 500 Mg/M2 every 3 weeks.  Status post 3 cycle. The patient has been tolerating this treatment fairly well except for mild fatigue. Her CBC today showed leukocytopenia and neutropenia and she does not meet the parameters for the treatment today. I will delay the start of cycle number 4 by 1 week until improvement of her count. I will see the patient back for follow-up visit next week before starting cycle #4. She was advised to call immediately if she has any concerning symptoms in the interval. The patient voices understanding of current disease status and treatment options and is in agreement with the current care plan.  All questions were answered. The patient knows to call the clinic with any problems, questions or concerns. We can certainly see the patient much sooner if necessary.  The total time spent in the appointment was 20 minutes.  Disclaimer: This note was dictated with voice recognition software. Similar sounding words can inadvertently be transcribed and may not be corrected upon review.

## 2022-07-08 NOTE — Telephone Encounter (Signed)
No answer/VM not set up 

## 2022-07-09 ENCOUNTER — Telehealth: Payer: Self-pay | Admitting: Internal Medicine

## 2022-07-09 NOTE — Telephone Encounter (Signed)
Scheduled per 03/21 los, patient has been called and notified of upcoming appointments.

## 2022-07-11 NOTE — Progress Notes (Deleted)
[facility] OFFICE PROGRESS NOTE  [name], MD [place] [address]  DIAGNOSIS: Stage IIb (T1 a, N1, M0) non-small cell lung cancer, adenocarcinoma presented with right upper lobe lung nodule diagnosed in November 2023. Molecular studies showed positive KRAS G12C mutation and PD-L1 expression of 1%.  PRIOR THERAPY: Status post right upper lobectomy with lymph node dissection under the care of Dr. Roxan Hockey on March 01, 2022.   CURRENT THERAPY: Adjuvant systemic chemotherapy with cisplatin 75 Mg/M2 and Alimta 500 Mg/M2.  First dose May 06, 2022.  Status post 3  cycles   INTERVAL HISTORY: Andrea Morgan 72 y.o. female returns to the clinic for a follow up visit. The patient is feeling well today without any concerning complaints. The patient was last seen by Dr. Julien Nordmann last week on 3/21. She was scheduled to start cycle #4 that day, however, she had neutropenia and her lab parameters were not met. Denies symptoms of infection?  The patient continues to tolerate treatment with __ well without any adverse effects. Denies any fever, chills, night sweats, or weight loss. Denies any chest pain, shortness of breath, cough, or hemoptysis. Denies any nausea, vomiting, diarrhea, or constipation. Denies any headache or visual changes. PN. Denies any rashes or skin changes. The patient is here today for evaluation prior to starting cycle # 4   MEDICAL HISTORY: Past Medical History:  Diagnosis Date   ADHD (attention deficit hyperactivity disorder)    Adrenal adenoma    Anal condyloma    Anemia    Anxiety    Arthritis    arthritis left jaw / pain     Cancer ([place]) 2013   lung cancer   DDD (degenerative disc disease), thoracic    Depression    Dyspnea    with exertion   Dyspnea on exertion    Emphysema/COPD (HCC)    GERD (gastroesophageal reflux disease)    Hiatal hernia    causes no problems per patient   History of adenomatous polyp of colon     06/ 2016 hyperplastic   History of cancer of lower lobe bronchus or lung followed by dr gerhardt-- lov 01/ 2017   06-02-2011  s/p  Right VATS w/ left mini thoracotomy resection 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)   History of condyloma acuminatum    removal anal canal condyloma 11-08-2013   History of kidney stones    passed stones   History of panic attacks    Hyperlipidemia    Hypertension    Hypothyroidism    Insomnia    Liver cyst    per CT in 2012   PAF (paroxysmal atrial fibrillation) [facility]) cardiologist-  dr Mare Ferrari   single episode Atrial Fibrillation w/ RVR post-op day 3 the right colectomy surgery on 11-08-2013       Pneumonia    PONV (postoperative nausea and vomiting)    Pulmonary nodule, right    right upper lobe per last Chest CT 05-14-2015    ALLERGIES:  is allergic to oxycontin [oxycodone], sulfa antibiotics, and morphine and related.  MEDICATIONS:  Current Outpatient Medications  Medication Sig Dispense Refill   albuterol (VENTOLIN HFA) 108 (90 Base) MCG/ACT inhaler Inhale 2 puffs into the lungs every 6 (six) hours as needed for wheezing or shortness of breath.     alendronate (FOSAMAX) 70 MG tablet Take 70 mg by mouth See admin instructions. 70 mg once a week on Thursday     ALPRAZolam (  XANAX) 1 MG tablet Take 1 mg by mouth 3 (three) times daily as needed for anxiety.     ASCORBIC ACID PO Take 1 tablet by mouth daily. Vitamin C, unknown strength.     aspirin EC 81 MG tablet Take 81 mg by mouth daily.     atorvastatin (LIPITOR) 20 MG tablet Take 20 mg by mouth at bedtime.      buPROPion (WELLBUTRIN SR) 150 MG 12 hr tablet Take 150 mg by mouth daily.     carvedilol (COREG) 3.125 MG tablet Take 3.125 mg by mouth every morning.     Cholecalciferol (VITAMIN D-3 PO) Take 1 capsule by mouth daily.     Cyanocobalamin (VITAMIN B-12 PO) Take 1 tablet by mouth daily.     desvenlafaxine (PRISTIQ) 50 MG 24 hr tablet Take 50 mg by mouth  daily.     dexamethasone (DECADRON) 4 MG tablet Please take 1 tablet twice a day the day before, the day of, and the day after chemotherapy 40 tablet 2   dexamethasone (DECADRON) 4 MG tablet Take 1 tab 2 times daily starting day before pemetrexed. Then take 2 tabs daily x 3 days starting day after cisplatin. Take with food. 30 tablet 1   diltiazem (CARDIZEM CD) 120 MG 24 hr capsule TAKE 1 CAPSULE (120 MG TOTAL) BY MOUTH DAILY. 30 capsule 7   donepezil (ARICEPT) 5 MG tablet Take 5 mg by mouth at bedtime.     escitalopram (LEXAPRO) 20 MG tablet Take 20 mg by mouth every evening.   3   feeding supplement (ENSURE ENLIVE / ENSURE PLUS) LIQD Take 237 mLs by mouth 3 (three) times daily between meals. 123XX123 mL 12   folic acid (FOLVITE) 1 MG tablet Take 1 tablet (1 mg total) by mouth daily. 30 tablet 2   folic acid (FOLVITE) 1 MG tablet Take 1 tablet (1 mg total) by mouth daily. Start 7 days before pemetrexed chemotherapy. Continue until 21 days after pemetrexed completed. 100 tablet 3   HYDROcodone-acetaminophen (NORCO) 7.5-325 MG tablet Take 1 tablet by mouth every 4 (four) hours as needed for severe pain. 15 tablet 0   levothyroxine (SYNTHROID) 88 MCG tablet Take 88 mcg by mouth every morning.     pantoprazole (PROTONIX) 40 MG tablet Take 40 mg by mouth daily.     potassium chloride SA (KLOR-CON M) 20 MEQ tablet Take 1 tablet (20 mEq total) by mouth daily. 7 tablet 0   pregabalin (LYRICA) 25 MG capsule Take 1 capsule (25 mg total) by mouth 2 (two) times daily. 60 capsule 1   prochlorperazine (COMPAZINE) 10 MG tablet Take 1 tablet (10 mg total) by mouth every 6 (six) hours as needed. 30 tablet 2   prochlorperazine (COMPAZINE) 10 MG tablet Take 1 tablet (10 mg total) by mouth every 6 (six) hours as needed for nausea or vomiting. 30 tablet 1   SYMBICORT 160-4.5 MCG/ACT inhaler Inhale 1 puff into the lungs 2 (two) times daily.     Tiotropium Bromide-Olodaterol (STIOLTO RESPIMAT) 2.5-2.5 MCG/ACT AERS Inhale 2  puffs into the lungs daily. 4 g 5   zolpidem (AMBIEN) 10 MG tablet Take 10 mg by mouth at bedtime.      No current facility-administered medications for this visit.    SURGICAL HISTORY:  Past Surgical History:  Procedure Laterality Date   APPENDECTOMY  1978   CERVICAL FUSION  1997   C5 -- C7   COLONOSCOPY  last one 10-17-2014   ESOPHAGOGASTRODUODENOSCOPY  05/21/2011  Procedure: ESOPHAGOGASTRODUODENOSCOPY (EGD);  Surgeon: Beryle Beams, MD;  Location: [facility];  Service: Endoscopy;  Laterality: N/A;   EXAMINATION UNDER ANESTHESIA N/A 11/08/2013   Procedure: ANAL EXAM UNDER ANESTHESIA WITH BIOPSY;  Surgeon: [name], MD;  [facility]: [facility];  Service: General;  Laterality: N/A;   INTERCOSTAL NERVE BLOCK Right 03/01/2022   Procedure: INTERCOSTAL NERVE BLOCK;  Surgeon: Melrose Nakayama, MD;  Location: [facility];  Service: Thoracic;  Laterality: Right;   LAPAROSCOPIC PARTIAL COLECTOMY N/A 11/08/2013   Procedure: laparoscopic partial right colectomy;  Surgeon: [name], MD;  Location: [facility];  Service: General;  Laterality: N/A;   MASS EXCISION  06/02/2011   Procedure: EXCISION MASS;  Surgeon: Grace Isaac, MD;  Location: [facility];  Service: Thoracic;;  minithoracotomy resection spindle cell tumor right chest   MASS EXCISION N/A 05/06/2016   Procedure: EXCISION ANAL CONDYLOMA;  Surgeon: [name], MD;  Location: [facility];  Service: General;  Laterality: N/A;   NODE DISSECTION Right 03/01/2022   Procedure: NODE DISSECTION;  Surgeon: Melrose Nakayama, MD;  Location: [facility];  Service: Thoracic;  Laterality: Right;   THORACIC DISCECTOMY Right 01/13/2016   Procedure: RIGHT THORACIC ONE THORACIC TWO THORACIC LAMINOTOMY AND MICRODISCECTOMY;  Surgeon: Jovita Gamma, MD;  Location: [facility];  Service: Neurosurgery;  Laterality: Right;   TONSILLECTOMY  1971   TRANSTHORACIC ECHOCARDIOGRAM  08/28/2014   ef 55-60%/  trivial MR and TR   VIDEO BRONCHOSCOPY   05/11/2011   Procedure: VIDEO BRONCHOSCOPY WITH FLUORO;  Surgeon: Collene Gobble, MD;  Location: [facility];  Service: Cardiopulmonary;  Laterality: Bilateral;   VIDEO BRONCHOSCOPY  06/02/2011   Procedure: VIDEO BRONCHOSCOPY;  Surgeon: Grace Isaac, MD;  Location: [facility];  Service: Thoracic;  Laterality: N/A;    REVIEW OF SYSTEMS:   Review of Systems  Constitutional: Negative for appetite change, chills, fatigue, fever and unexpected weight change.  HENT:   Negative for mouth sores, nosebleeds, sore throat and trouble swallowing.   Eyes: Negative for eye problems and icterus.  Respiratory: Negative for cough, hemoptysis, shortness of breath and wheezing.   Cardiovascular: Negative for chest pain and leg swelling.  Gastrointestinal: Negative for abdominal pain, constipation, diarrhea, nausea and vomiting.  Genitourinary: Negative for bladder incontinence, difficulty urinating, dysuria, frequency and hematuria.   Musculoskeletal: Negative for back pain, gait problem, neck pain and neck stiffness.  Skin: Negative for itching and rash.  Neurological: Negative for dizziness, extremity weakness, gait problem, headaches, light-headedness and seizures.  Hematological: Negative for adenopathy. Does not bruise/bleed easily.  Psychiatric/Behavioral: Negative for confusion, depression and sleep disturbance. The patient is not nervous/anxious.     PHYSICAL EXAMINATION:  There were no vitals taken for this visit.  ECOG PERFORMANCE STATUS: {CHL ONC ECOG [id number]  Physical Exam  Constitutional: Oriented to person, place, and time and well-developed, well-nourished, and in no distress. No distress.  HENT:  Head: Normocephalic and atraumatic.  Mouth/Throat: Oropharynx is clear and moist. No oropharyngeal exudate.  Eyes: Conjunctivae are normal. Right eye exhibits no discharge. Left eye exhibits no discharge. No scleral icterus.  Neck: Normal range of motion. Neck supple.  Cardiovascular:  Normal rate, regular rhythm, normal heart sounds and intact distal pulses.   Pulmonary/Chest: Effort normal and breath sounds normal. No respiratory distress. No wheezes. No rales.  Abdominal: Soft. Bowel sounds are normal. Exhibits no distension and no mass. There is no tenderness.  Musculoskeletal: Normal range of motion. Exhibits no edema.  Lymphadenopathy:  No cervical adenopathy.  Neurological: Alert and oriented to person, place, and time. Exhibits normal muscle tone. Gait normal. Coordination normal.  Skin: Skin is warm and dry. No rash noted. Not diaphoretic. No erythema. No pallor.  Psychiatric: Mood, memory and judgment normal.  Vitals reviewed.  LABORATORY DATA: Lab Results  Component Value Date   WBC 2.0 (L) 07/08/2022   HGB 10.5 (L) 07/08/2022   HCT 30.0 (L) 07/08/2022   MCV 83.8 07/08/2022   PLT 606 (H) 07/08/2022      Chemistry      Component Value Date/Time   NA 134 (L) 07/08/2022 0921   K 3.6 07/08/2022 0921   CL 100 07/08/2022 0921   CO2 23 07/08/2022 0921   BUN 9 07/08/2022 0921   CREATININE 0.96 07/08/2022 0921   CREATININE 0.71 04/23/2013 1443      Component Value Date/Time   CALCIUM 8.6 (L) 07/08/2022 0921   ALKPHOS 69 07/08/2022 0921   AST 16 07/08/2022 0921   ALT 10 07/08/2022 0921   BILITOT 0.4 07/08/2022 0921       RADIOGRAPHIC STUDIES:  No results found.   ASSESSMENT/PLAN:  This is a very pleasant 72 year old female with stage IIb (T2a, N1, M0) non-small cell lung cancer, adenocarcinoma.  She presented with a right upper lobe nodule. She was diagnosed in November 2023. Her PDL1 expression is 1% and she is positive for KRAS G12C mutation which can be used in the metastatic setting second line after chemotherapy.    Patient underwent a right upper lobectomy on 03/01/2022 under the care of Dr. Roxan Hockey. The final pathology showed a 1.3 cm adenocarcinoma with visceral pleural involvement and focal lymphovascular involvement. There is also  a positive lymph node.   She is currently undergoing adjuvant systemic chemotherapy with Cisplatin 75 mg/m2 and Alimta 500 mg/m IV every 3 weeks.  She is status post 3 cycles.   Her treatment was delayed by 1 week due to neutropenia.   The patient was seen with Dr. Julien Nordmann. Labs were reviewed. Recommend ****.   She will *** with cycle #4 today as scheduled.   I will arrange for a restaging CT scan of the chest. We will see her back for a follow up visi tin 3 weeks and to review her scan.   The patient was advised to call immediately if she has any concerning symptoms in the interval. The patient voices understanding of current disease status and treatment options and is in agreement with the current care plan. All questions were answered. The patient knows to call the clinic with any problems, questions or concerns. We can certainly see the patient much sooner if necessary   No orders of the defined types were placed in this encounter.    I spent {CHL ONC TIME VISIT - [id number] counseling the patient face to face. The total time spent in the appointment was {CHL ONC TIME VISIT - [id number].  Arthur Aydelotte L Renise Gillies, PA-C 07/11/22

## 2022-07-13 ENCOUNTER — Telehealth: Payer: Self-pay | Admitting: Internal Medicine

## 2022-07-13 ENCOUNTER — Ambulatory Visit: Payer: 59 | Admitting: Physician Assistant

## 2022-07-13 ENCOUNTER — Inpatient Hospital Stay: Payer: 59

## 2022-07-13 ENCOUNTER — Inpatient Hospital Stay: Payer: 59 | Admitting: Physician Assistant

## 2022-07-13 ENCOUNTER — Other Ambulatory Visit: Payer: 59

## 2022-07-13 MED FILL — Dexamethasone Sodium Phosphate Inj 100 MG/10ML: INTRAMUSCULAR | Qty: 1 | Status: AC

## 2022-07-13 MED FILL — Fosaprepitant Dimeglumine For IV Infusion 150 MG (Base Eq): INTRAVENOUS | Qty: 5 | Status: AC

## 2022-07-13 NOTE — Telephone Encounter (Signed)
Rescheduled 03/26 appointment to 03/27, called and notified patient regarding appointment times.

## 2022-07-14 ENCOUNTER — Inpatient Hospital Stay: Payer: 59

## 2022-07-14 ENCOUNTER — Other Ambulatory Visit: Payer: Self-pay

## 2022-07-14 ENCOUNTER — Encounter: Payer: Self-pay | Admitting: Internal Medicine

## 2022-07-14 ENCOUNTER — Inpatient Hospital Stay (HOSPITAL_BASED_OUTPATIENT_CLINIC_OR_DEPARTMENT_OTHER): Payer: 59 | Admitting: Internal Medicine

## 2022-07-14 ENCOUNTER — Encounter: Payer: Self-pay | Admitting: Medical Oncology

## 2022-07-14 DIAGNOSIS — C3491 Malignant neoplasm of unspecified part of right bronchus or lung: Secondary | ICD-10-CM | POA: Diagnosis not present

## 2022-07-14 DIAGNOSIS — Z5111 Encounter for antineoplastic chemotherapy: Secondary | ICD-10-CM | POA: Diagnosis not present

## 2022-07-14 LAB — CBC WITH DIFFERENTIAL (CANCER CENTER ONLY)
Abs Immature Granulocytes: 2.5 10*3/uL — ABNORMAL HIGH (ref 0.00–0.07)
Basophils Absolute: 0.2 10*3/uL — ABNORMAL HIGH (ref 0.0–0.1)
Basophils Relative: 1 %
Eosinophils Absolute: 0 10*3/uL (ref 0.0–0.5)
Eosinophils Relative: 0 %
HCT: 33.9 % — ABNORMAL LOW (ref 36.0–46.0)
Hemoglobin: 11.4 g/dL — ABNORMAL LOW (ref 12.0–15.0)
Immature Granulocytes: 9 %
Lymphocytes Relative: 7 %
Lymphs Abs: 1.7 10*3/uL (ref 0.7–4.0)
MCH: 28.7 pg (ref 26.0–34.0)
MCHC: 33.6 g/dL (ref 30.0–36.0)
MCV: 85.4 fL (ref 80.0–100.0)
Monocytes Absolute: 1.2 10*3/uL — ABNORMAL HIGH (ref 0.1–1.0)
Monocytes Relative: 4 %
Neutro Abs: 21 10*3/uL — ABNORMAL HIGH (ref 1.7–7.7)
Neutrophils Relative %: 79 %
Platelet Count: 566 10*3/uL — ABNORMAL HIGH (ref 150–400)
RBC: 3.97 MIL/uL (ref 3.87–5.11)
RDW: 23.3 % — ABNORMAL HIGH (ref 11.5–15.5)
WBC Count: 26.6 10*3/uL — ABNORMAL HIGH (ref 4.0–10.5)
nRBC: 0.3 % — ABNORMAL HIGH (ref 0.0–0.2)

## 2022-07-14 LAB — CMP (CANCER CENTER ONLY)
ALT: 16 U/L (ref 0–44)
AST: 18 U/L (ref 15–41)
Albumin: 3.9 g/dL (ref 3.5–5.0)
Alkaline Phosphatase: 52 U/L (ref 38–126)
Anion gap: 9 (ref 5–15)
BUN: 18 mg/dL (ref 8–23)
CO2: 27 mmol/L (ref 22–32)
Calcium: 8.8 mg/dL — ABNORMAL LOW (ref 8.9–10.3)
Chloride: 97 mmol/L — ABNORMAL LOW (ref 98–111)
Creatinine: 0.95 mg/dL (ref 0.44–1.00)
GFR, Estimated: >60 mL/min (ref >60)
Glucose, Bld: 134 mg/dL — ABNORMAL HIGH (ref 70–99)
Potassium: 4.2 mmol/L (ref 3.5–5.1)
Sodium: 133 mmol/L — ABNORMAL LOW (ref 135–145)
Total Bilirubin: 0.4 mg/dL (ref 0.3–1.2)
Total Protein: 6.6 g/dL (ref 6.5–8.1)

## 2022-07-14 LAB — MAGNESIUM: Magnesium: 2 mg/dL (ref 1.7–2.4)

## 2022-07-14 MED ORDER — SODIUM CHLORIDE 0.9 % IV SOLN
150.0000 mg | Freq: Once | INTRAVENOUS | Status: AC
  Administered 2022-07-14: 150 mg via INTRAVENOUS
  Filled 2022-07-14: qty 5
  Filled 2022-07-14: qty 150

## 2022-07-14 MED ORDER — SODIUM CHLORIDE 0.9 % IV SOLN
500.0000 mg/m2 | Freq: Once | INTRAVENOUS | Status: AC
  Administered 2022-07-14: 900 mg via INTRAVENOUS
  Filled 2022-07-14: qty 20

## 2022-07-14 MED ORDER — SODIUM CHLORIDE 0.9 % IV SOLN
75.0000 mg/m2 | Freq: Once | INTRAVENOUS | Status: AC
  Administered 2022-07-14: 132 mg via INTRAVENOUS
  Filled 2022-07-14: qty 132

## 2022-07-14 MED ORDER — MAGNESIUM SULFATE 2 GM/50ML IV SOLN
2.0000 g | Freq: Once | INTRAVENOUS | Status: AC
  Administered 2022-07-14: 2 g via INTRAVENOUS
  Filled 2022-07-14: qty 50

## 2022-07-14 MED ORDER — SODIUM CHLORIDE 0.9 % IV SOLN
10.0000 mg | Freq: Once | INTRAVENOUS | Status: AC
  Administered 2022-07-14: 10 mg via INTRAVENOUS
  Filled 2022-07-14: qty 1
  Filled 2022-07-14: qty 10

## 2022-07-14 MED ORDER — POTASSIUM CHLORIDE IN NACL 20-0.9 MEQ/L-% IV SOLN
Freq: Once | INTRAVENOUS | Status: AC
  Filled 2022-07-14: qty 1000

## 2022-07-14 MED ORDER — SODIUM CHLORIDE 0.9 % IV SOLN: Freq: Once | INTRAVENOUS | Status: AC

## 2022-07-14 MED ORDER — PALONOSETRON HCL INJECTION 0.25 MG/5ML
0.2500 mg | Freq: Once | INTRAVENOUS | Status: AC
  Administered 2022-07-14: 0.25 mg via INTRAVENOUS
  Filled 2022-07-14: qty 5

## 2022-07-14 NOTE — Progress Notes (Signed)
Per Dr. Julien Nordmann OK to infuse post hydration concurrently with Cisplatin infusion.

## 2022-07-14 NOTE — Progress Notes (Signed)
[facility] [phone]   [phone]  OFFICE PROGRESS NOTE  [name], MD [place] [address]  DIAGNOSIS: Stage IIb (T1 a, N1, M0) non-small cell lung cancer, adenocarcinoma presented with right upper lobe lung nodule diagnosed in November 2023. Molecular studies showed positive KRAS G12C mutation and PD-L1 expression of 1%.  PRIOR THERAPY: Status post right upper lobectomy with lymph node dissection under the care of Dr. Roxan Hockey on March 01, 2022.  CURRENT THERAPY: Adjuvant systemic chemotherapy with cisplatin 75 Mg/M2 and Alimta 500 Mg/M2.  First dose May 06, 2022.  Status post 3  cycles.  INTERVAL HISTORY: Andrea Morgan 72 y.o. female returns to the clinic today for follow-up visit.  The patient is feeling fine today with no concerning complaints but very excited about doing the last cycle of her treatment.  Her blood pressure is elevated today.  She denied having any chest pain, shortness of breath, cough or hemoptysis.  She has no nausea, vomiting, diarrhea or constipation.  She has no headache or visual changes.  She is here today for evaluation before the last cycle of her treatment.  MEDICAL HISTORY: Past Medical History:  Diagnosis Date   ADHD (attention deficit hyperactivity disorder)    Adrenal adenoma    Anal condyloma    Anemia    Anxiety    Arthritis    arthritis left jaw / pain     Cancer ([facility]) 2013   lung cancer   DDD (degenerative disc disease), thoracic    Depression    Dyspnea    with exertion   Dyspnea on exertion    Emphysema/COPD ([facility])    GERD (gastroesophageal reflux disease)    Hiatal hernia    causes no problems per patient   History of adenomatous polyp of colon    06/ 2016 hyperplastic   History of cancer of lower lobe bronchus or lung followed by dr gerhardt-- lov 01/ 2017   06-02-2011  s/p  Right VATS w/ left mini thoracotomy resection right chest Spindle Cell  Carcinoma Tumor (right lower lobe resection)   History of condyloma acuminatum    removal anal canal condyloma 11-08-2013   History of kidney stones    passed stones   History of panic attacks    Hyperlipidemia    Hypertension    Hypothyroidism    Insomnia    Liver cyst    per CT in 2012   PAF (paroxysmal atrial fibrillation) [facility] cardiologist-  dr Mare Ferrari   single episode Atrial Fibrillation w/ RVR post-op day 3 the right colectomy surgery on 11-08-2013       Pneumonia    PONV (postoperative nausea and vomiting)    Pulmonary nodule, right    right upper lobe per last Chest CT 05-14-2015    ALLERGIES:  is allergic to oxycontin [oxycodone], sulfa antibiotics, and morphine and related.  MEDICATIONS:  Current Outpatient Medications  Medication Sig Dispense Refill   albuterol (VENTOLIN HFA) 108 (90 Base) MCG/ACT inhaler Inhale 2 puffs into the lungs every 6 (six) hours as needed for wheezing or shortness of breath.     alendronate (FOSAMAX) 70 MG tablet Take 70 mg by mouth See admin instructions. 70 mg once a week on Thursday     ALPRAZolam (XANAX) 1 MG tablet Take 1 mg by mouth 3 (three) times daily as needed for anxiety.     ASCORBIC ACID PO Take 1 tablet by mouth daily. Vitamin C,  unknown strength.     aspirin EC 81 MG tablet Take 81 mg by mouth daily.     atorvastatin (LIPITOR) 20 MG tablet Take 20 mg by mouth at bedtime.      buPROPion (WELLBUTRIN SR) 150 MG 12 hr tablet Take 150 mg by mouth daily.     carvedilol (COREG) 3.125 MG tablet Take 3.125 mg by mouth every morning.     Cholecalciferol (VITAMIN D-3 PO) Take 1 capsule by mouth daily.     Cyanocobalamin (VITAMIN B-12 PO) Take 1 tablet by mouth daily.     desvenlafaxine (PRISTIQ) 50 MG 24 hr tablet Take 50 mg by mouth daily.     dexamethasone (DECADRON) 4 MG tablet Please take 1 tablet twice a day the day before, the day of, and the day after chemotherapy 40 tablet 2   dexamethasone (DECADRON) 4 MG tablet Take 1 tab 2  times daily starting day before pemetrexed. Then take 2 tabs daily x 3 days starting day after cisplatin. Take with food. 30 tablet 1   diltiazem (CARDIZEM CD) 120 MG 24 hr capsule TAKE 1 CAPSULE (120 MG TOTAL) BY MOUTH DAILY. 30 capsule 7   donepezil (ARICEPT) 5 MG tablet Take 5 mg by mouth at bedtime.     escitalopram (LEXAPRO) 20 MG tablet Take 20 mg by mouth every evening.   3   feeding supplement (ENSURE ENLIVE / ENSURE PLUS) LIQD Take 237 mLs by mouth 3 (three) times daily between meals. 123XX123 mL 12   folic acid (FOLVITE) 1 MG tablet Take 1 tablet (1 mg total) by mouth daily. 30 tablet 2   folic acid (FOLVITE) 1 MG tablet Take 1 tablet (1 mg total) by mouth daily. Start 7 days before pemetrexed chemotherapy. Continue until 21 days after pemetrexed completed. 100 tablet 3   HYDROcodone-acetaminophen (NORCO) 7.5-325 MG tablet Take 1 tablet by mouth every 4 (four) hours as needed for severe pain. 15 tablet 0   levothyroxine (SYNTHROID) 88 MCG tablet Take 88 mcg by mouth every morning.     pantoprazole (PROTONIX) 40 MG tablet Take 40 mg by mouth daily.     potassium chloride SA (KLOR-CON M) 20 MEQ tablet Take 1 tablet (20 mEq total) by mouth daily. 7 tablet 0   pregabalin (LYRICA) 25 MG capsule Take 1 capsule (25 mg total) by mouth 2 (two) times daily. 60 capsule 1   prochlorperazine (COMPAZINE) 10 MG tablet Take 1 tablet (10 mg total) by mouth every 6 (six) hours as needed. 30 tablet 2   prochlorperazine (COMPAZINE) 10 MG tablet Take 1 tablet (10 mg total) by mouth every 6 (six) hours as needed for nausea or vomiting. 30 tablet 1   SYMBICORT 160-4.5 MCG/ACT inhaler Inhale 1 puff into the lungs 2 (two) times daily.     Tiotropium Bromide-Olodaterol (STIOLTO RESPIMAT) 2.5-2.5 MCG/ACT AERS Inhale 2 puffs into the lungs daily. 4 g 5   zolpidem (AMBIEN) 10 MG tablet Take 10 mg by mouth at bedtime.      No current facility-administered medications for this visit.    SURGICAL HISTORY:  Past Surgical  History:  Procedure Laterality Date   APPENDECTOMY  1978   CERVICAL FUSION  1997   C5 -- C7   COLONOSCOPY  last one 10-17-2014   ESOPHAGOGASTRODUODENOSCOPY  05/21/2011   Procedure: ESOPHAGOGASTRODUODENOSCOPY (EGD);  Surgeon: Beryle Beams, MD;  Location: [facility];  Service: Endoscopy;  Laterality: N/A;   EXAMINATION UNDER ANESTHESIA N/A 11/08/2013   Procedure: ANAL EXAM  UNDER ANESTHESIA WITH BIOPSY;  Surgeon: [name], MD;  Location: [facility];  Service: General;  Laterality: N/A;   INTERCOSTAL NERVE BLOCK Right 03/01/2022   Procedure: INTERCOSTAL NERVE BLOCK;  Surgeon: Melrose Nakayama, MD;  Location: [facility];  Service: Thoracic;  Laterality: Right;   LAPAROSCOPIC PARTIAL COLECTOMY N/A 11/08/2013   Procedure: laparoscopic partial right colectomy;  Surgeon: [name], MD;  Location: [facility];  Service: General;  Laterality: N/A;   MASS EXCISION  06/02/2011   Procedure: EXCISION MASS;  Surgeon: Grace Isaac, MD;  Location: [facility];  Service: Thoracic;;  minithoracotomy resection spindle cell tumor right chest   MASS EXCISION N/A 05/06/2016   Procedure: EXCISION ANAL CONDYLOMA;  Surgeon: [name], MD;  Location: [facility];  Service: General;  Laterality: N/A;   NODE DISSECTION Right 03/01/2022   Procedure: NODE DISSECTION;  Surgeon: Melrose Nakayama, MD;  Location: [facility];  Service: Thoracic;  Laterality: Right;   THORACIC DISCECTOMY Right 01/13/2016   Procedure: RIGHT THORACIC ONE THORACIC TWO THORACIC LAMINOTOMY AND MICRODISCECTOMY;  Surgeon: Jovita Gamma, MD;  Location: [facility];  Service: Neurosurgery;  Laterality: Right;   TONSILLECTOMY  1971   TRANSTHORACIC ECHOCARDIOGRAM  08/28/2014   ef 55-60%/  trivial MR and TR   VIDEO BRONCHOSCOPY  05/11/2011   Procedure: VIDEO BRONCHOSCOPY WITH FLUORO;  Surgeon: Collene Gobble, MD;  Location: [facility];  Service: Cardiopulmonary;  Laterality: Bilateral;   VIDEO BRONCHOSCOPY  06/02/2011   Procedure:  VIDEO BRONCHOSCOPY;  Surgeon: Grace Isaac, MD;  Location: [facility];  Service: Thoracic;  Laterality: N/A;    REVIEW OF SYSTEMS:  A comprehensive review of systems was negative except for: Constitutional: positive for fatigue   PHYSICAL EXAMINATION: General appearance: alert, cooperative, fatigued, and no distress Head: Normocephalic, without obvious abnormality, atraumatic Neck: no adenopathy, no JVD, supple, symmetrical, trachea midline, and thyroid not enlarged, symmetric, no tenderness/mass/nodules Lymph nodes: Cervical, supraclavicular, and axillary nodes normal. Resp: clear to auscultation bilaterally Back: symmetric, no curvature. ROM normal. No CVA tenderness. Cardio: regular rate and rhythm, S1, S2 normal, no murmur, click, rub or gallop GI: soft, non-tender; bowel sounds normal; no masses,  no organomegaly Extremities: extremities normal, atraumatic, no cyanosis or edema  ECOG PERFORMANCE STATUS: 1 - Symptomatic but completely ambulatory  Blood pressure (!) 174/75, pulse 81, temperature 97.6 F (36.4 C), temperature source Oral, resp. rate 17, weight 150 lb 11.2 oz (68.4 kg), SpO2 99 %.  LABORATORY DATA: Lab Results  Component Value Date   WBC 2.0 (L) 07/08/2022   HGB 10.5 (L) 07/08/2022   HCT 30.0 (L) 07/08/2022   MCV 83.8 07/08/2022   PLT 606 (H) 07/08/2022      Chemistry      Component Value Date/Time   NA 134 (L) 07/08/2022 0921   K 3.6 07/08/2022 0921   CL 100 07/08/2022 0921   CO2 23 07/08/2022 0921   BUN 9 07/08/2022 0921   CREATININE 0.96 07/08/2022 0921   CREATININE 0.71 04/23/2013 1443      Component Value Date/Time   CALCIUM 8.6 (L) 07/08/2022 0921   ALKPHOS 69 07/08/2022 0921   AST 16 07/08/2022 0921   ALT 10 07/08/2022 0921   BILITOT 0.4 07/08/2022 0921       RADIOGRAPHIC STUDIES: No results found.  ASSESSMENT AND PLAN: This is a very pleasant 72 years old white female with Stage IIb (T1 a, N1, M0) non-small cell lung cancer,  adenocarcinoma presented with right upper lobe  lung nodule diagnosed in November 2023. Molecular studies showed positive KRAS G12C mutation and PD-L1 expression of 1%.  She is status post right upper lobectomy with lymph node dissection under the care of Dr. Roxan Hockey on March 01, 2022. The patient is currently undergoing treatment with adjuvant systemic chemotherapy with cisplatin 75 Mg/M2 and Alimta 500 Mg/M2 every 3 weeks.  Status post 3 cycle. The patient has been tolerating this treatment fairly well with no concerning adverse effects.  Her treatment was delayed from last week because of low blood count. She is feeling much better today than her total white blood count had recovered. I recommended for her to proceed with the last cycle of her treatment today as planned. I will see her back for follow-up visit in 1 months for evaluation and repeat blood work as well as CT scan of the chest for restaging of her disease. For the hypertension the patient was advised to monitor her blood pressure closely at home and to discuss with her primary care physician for adjustments of her medication if needed. She was advised to call immediately if she has any other concerning symptoms in the interval. The patient voices understanding of current disease status and treatment options and is in agreement with the current care plan.  All questions were answered. The patient knows to call the clinic with any problems, questions or concerns. We can certainly see the patient much sooner if necessary.  The total time spent in the appointment was 20 minutes.  Disclaimer: This note was dictated with voice recognition software. Similar sounding words can inadvertently be transcribed and may not be corrected upon review.

## 2022-07-14 NOTE — Addendum Note (Signed)
Addended by: Ardeen Garland on: 07/14/2022 12:09 PM   Modules accepted: Orders

## 2022-07-14 NOTE — Patient Instructions (Signed)
[facility]  Discharge Instructions: Thank you for choosing [facility] to provide your oncology and hematology care.   If you have a lab appointment with the [facility], please go directly to the [facility] and check in at the registration area.   Wear comfortable clothing and clothing appropriate for easy access to any Portacath or PICC line.   We strive to give you quality time with your provider. You may need to reschedule your appointment if you arrive late (15 or more minutes).  Arriving late affects you and other patients whose appointments are after yours.  Also, if you miss three or more appointments without notifying the office, you may be dismissed from the clinic at the provider's discretion.      For prescription refill requests, have your pharmacy contact our office and allow 72 hours for refills to be completed.    Today you received the following chemotherapy and/or immunotherapy agents: Alimta/Cisplatin      To help prevent nausea and vomiting after your treatment, we encourage you to take your nausea medication as directed.  BELOW ARE SYMPTOMS THAT SHOULD BE REPORTED IMMEDIATELY: *FEVER GREATER THAN 100.4 F (38 C) OR HIGHER *CHILLS OR SWEATING *NAUSEA AND VOMITING THAT IS NOT CONTROLLED WITH YOUR NAUSEA MEDICATION *UNUSUAL SHORTNESS OF BREATH *UNUSUAL BRUISING OR BLEEDING *URINARY PROBLEMS (pain or burning when urinating, or frequent urination) *BOWEL PROBLEMS (unusual diarrhea, constipation, pain near the anus) TENDERNESS IN MOUTH AND THROAT WITH OR WITHOUT PRESENCE OF ULCERS (sore throat, sores in mouth, or a toothache) UNUSUAL RASH, SWELLING OR PAIN  UNUSUAL VAGINAL DISCHARGE OR ITCHING   Items with * indicate a potential emergency and should be followed up as soon as possible or go to the Emergency Department if any problems should occur.  Please show the CHEMOTHERAPY ALERT CARD or IMMUNOTHERAPY ALERT CARD  at check-in to the Emergency Department and triage nurse.  Should you have questions after your visit or need to cancel or reschedule your appointment, please contact [facility]  Dept: [phone]  and follow the prompts.  Office hours are 8:00 a.m. to 4:30 p.m. Monday - Friday. Please note that voicemails left after 4:00 p.m. may not be returned until the following business day.  We are closed weekends and major holidays. You have access to a nurse at all times for urgent questions. Please call the main number to the clinic Dept: [phone] and follow the prompts.   For any non-urgent questions, you may also contact your provider using MyChart. We now offer e-Visits for anyone 36 and older to request care online for non-urgent symptoms. For details visit [email or web address].   Also download the MyChart app! Go to the app store, search "MyChart", open the app, select [facility], and log in with your MyChart username and password.

## 2022-07-14 NOTE — Progress Notes (Unsigned)
Patient seen by within treatment parameters.  Vitals are within treatment parameters.  Labs reviewed: and are within treatment parameters.  Per physician team, patient is ready for treatment and there are NO modifications to the treatment plan.

## 2022-07-20 ENCOUNTER — Other Ambulatory Visit: Payer: Self-pay

## 2022-08-03 ENCOUNTER — Ambulatory Visit: Payer: 59 | Admitting: Thoracic Surgery (Cardiothoracic Vascular Surgery)

## 2022-08-03 ENCOUNTER — Encounter: Payer: Self-pay | Admitting: Thoracic Surgery (Cardiothoracic Vascular Surgery)

## 2022-08-04 ENCOUNTER — Telehealth: Payer: Self-pay | Admitting: Thoracic Surgery (Cardiothoracic Vascular Surgery)

## 2022-08-04 ENCOUNTER — Ambulatory Visit
Admission: RE | Admit: 2022-08-04 | Discharge: 2022-08-04 | Disposition: A | Discharge status: Home / Self Care | Payer: 59 | Source: Ambulatory Visit | Attending: Thoracic Surgery (Cardiothoracic Vascular Surgery) | Admitting: Thoracic Surgery (Cardiothoracic Vascular Surgery)

## 2022-08-04 DIAGNOSIS — C3490 Malignant neoplasm of unspecified part of unspecified bronchus or lung: Secondary | ICD-10-CM

## 2022-08-04 NOTE — Telephone Encounter (Signed)
      [address]       [place] [address]             [phone]    Andrea Morgan missed her appointment with me yesterday due to memory problems.  The plan was for a routine checkup to see how she is doing postoperatively.  I spoke to her on the telephone.  I was in my office and she was at home.  Confirmed identity with 2 separate identifiers.  She is a 72 year old woman with a history of remote tobacco abuse, COPD, solitary fibrous tumor of the pleura, and a stage IIb (T2a, N1) adenocarcinoma.  She had a robotic assisted right upper lobectomy on 03/01/2022.  I last saw her in the office in December.  She was still having some significant issues with pain.  In the interim since her last visit she has undergone chemotherapy with Dr. Arbutus Ped.  She says that this made her extremely tired but otherwise has tolerated it well.  No issues related to her surgery.  CHEST - 2 VIEW   COMPARISON:  Chest radiograph 03/15/2022.   FINDINGS: The cardiomediastinal silhouette is stable. Soft tissue density in the right hilar region with architectural distortion and volume loss is similar to the prior study, and consistent with postsurgical change.   The lungs hyperlucent consistent with underlying COPD. There is no focal consolidation. There is no pulmonary edema. There is right apical pleural capping which may reflect loculated fluid. There is no pneumothorax   There is no acute osseous abnormality.   IMPRESSION: 1. Postsurgical changes in the right hemithorax with apical capping which may reflect loculated pleural fluid. 2. No focal airspace opacity.     Electronically Signed   By: Lesia Hausen M.D.   On: 08/04/2022 09:46    I informed her that I reviewed the chest x-ray and it shows postoperative changes with no concerning findings.  She will follow-up with Dr. Arbutus Ped.  I will be happy to see her if I can be of any assistance with her care.  Salvatore Decent  Dorris Fetch, MD [facility] [phone]

## 2022-08-12 ENCOUNTER — Other Ambulatory Visit: Payer: Self-pay | Admitting: Medical Oncology

## 2022-08-12 ENCOUNTER — Telehealth: Payer: Self-pay | Admitting: Medical Oncology

## 2022-08-12 ENCOUNTER — Telehealth: Payer: Self-pay | Admitting: Internal Medicine

## 2022-08-12 ENCOUNTER — Other Ambulatory Visit: Payer: Self-pay | Admitting: Internal Medicine

## 2022-08-12 DIAGNOSIS — C3490 Malignant neoplasm of unspecified part of unspecified bronchus or lung: Secondary | ICD-10-CM

## 2022-08-12 NOTE — Telephone Encounter (Signed)
Pt cannot get to [facility] for CT.

## 2022-08-12 NOTE — Telephone Encounter (Signed)
Contacted patient to scheduled appointments. Left message with appointment details and a call back number if patient had any questions or could not accommodate the time we provided.   

## 2022-08-12 NOTE — Progress Notes (Signed)
Labs &CT scan scheduled and pt notified.

## 2022-08-13 ENCOUNTER — Other Ambulatory Visit: Payer: Self-pay

## 2022-08-17 ENCOUNTER — Other Ambulatory Visit: Payer: 59

## 2022-08-19 ENCOUNTER — Ambulatory Visit: Payer: 59 | Admitting: Internal Medicine

## 2022-08-20 ENCOUNTER — Other Ambulatory Visit (HOSPITAL_BASED_OUTPATIENT_CLINIC_OR_DEPARTMENT_OTHER): Payer: 59

## 2022-08-23 ENCOUNTER — Ambulatory Visit: Payer: 59 | Admitting: Internal Medicine

## 2022-08-31 ENCOUNTER — Telehealth: Payer: Self-pay | Admitting: Internal Medicine

## 2022-09-01 ENCOUNTER — Other Ambulatory Visit: Payer: Self-pay

## 2022-09-01 ENCOUNTER — Inpatient Hospital Stay: Payer: 59 | Attending: Physician Assistant

## 2022-09-01 ENCOUNTER — Ambulatory Visit (HOSPITAL_COMMUNITY)
Admission: RE | Admit: 2022-09-01 | Discharge: 2022-09-01 | Disposition: A | Discharge status: Home / Self Care | Payer: 59 | Source: Ambulatory Visit | Attending: Internal Medicine | Admitting: Internal Medicine

## 2022-09-01 DIAGNOSIS — Z9221 Personal history of antineoplastic chemotherapy: Secondary | ICD-10-CM | POA: Diagnosis not present

## 2022-09-01 DIAGNOSIS — Z902 Acquired absence of lung [part of]: Secondary | ICD-10-CM | POA: Diagnosis not present

## 2022-09-01 DIAGNOSIS — Z79899 Other long term (current) drug therapy: Secondary | ICD-10-CM | POA: Insufficient documentation

## 2022-09-01 DIAGNOSIS — C3490 Malignant neoplasm of unspecified part of unspecified bronchus or lung: Secondary | ICD-10-CM | POA: Insufficient documentation

## 2022-09-01 DIAGNOSIS — C3411 Malignant neoplasm of upper lobe, right bronchus or lung: Secondary | ICD-10-CM | POA: Insufficient documentation

## 2022-09-01 DIAGNOSIS — C3491 Malignant neoplasm of unspecified part of right bronchus or lung: Secondary | ICD-10-CM

## 2022-09-01 LAB — MAGNESIUM: Magnesium: 1.9 mg/dL (ref 1.7–2.4)

## 2022-09-01 LAB — CBC WITH DIFFERENTIAL (CANCER CENTER ONLY)
Abs Immature Granulocytes: 0.67 10*3/uL — ABNORMAL HIGH (ref 0.00–0.07)
Basophils Absolute: 0.1 10*3/uL (ref 0.0–0.1)
Basophils Relative: 0 %
Eosinophils Absolute: 0.1 10*3/uL (ref 0.0–0.5)
Eosinophils Relative: 1 %
HCT: 34.5 % — ABNORMAL LOW (ref 36.0–46.0)
Hemoglobin: 11.6 g/dL — ABNORMAL LOW (ref 12.0–15.0)
Immature Granulocytes: 6 %
Lymphocytes Relative: 13 %
Lymphs Abs: 1.4 10*3/uL (ref 0.7–4.0)
MCH: 31.7 pg (ref 26.0–34.0)
MCHC: 33.6 g/dL (ref 30.0–36.0)
MCV: 94.3 fL (ref 80.0–100.0)
Monocytes Absolute: 0.7 10*3/uL (ref 0.1–1.0)
Monocytes Relative: 6 %
Neutro Abs: 8.2 10*3/uL — ABNORMAL HIGH (ref 1.7–7.7)
Neutrophils Relative %: 74 %
Platelet Count: 299 10*3/uL (ref 150–400)
RBC: 3.66 MIL/uL — ABNORMAL LOW (ref 3.87–5.11)
RDW: 20.7 % — ABNORMAL HIGH (ref 11.5–15.5)
WBC Count: 11.1 10*3/uL — ABNORMAL HIGH (ref 4.0–10.5)
nRBC: 0 % (ref 0.0–0.2)

## 2022-09-01 LAB — CMP (CANCER CENTER ONLY)
ALT: 22 U/L (ref 0–44)
AST: 18 U/L (ref 15–41)
Albumin: 4.1 g/dL (ref 3.5–5.0)
Alkaline Phosphatase: 38 U/L (ref 38–126)
Anion gap: 8 (ref 5–15)
BUN: 35 mg/dL — ABNORMAL HIGH (ref 8–23)
CO2: 27 mmol/L (ref 22–32)
Calcium: 8.4 mg/dL — ABNORMAL LOW (ref 8.9–10.3)
Chloride: 102 mmol/L (ref 98–111)
Creatinine: 1 mg/dL (ref 0.44–1.00)
GFR, Estimated: 59 mL/min — ABNORMAL LOW (ref >60)
Glucose, Bld: 136 mg/dL — ABNORMAL HIGH (ref 70–99)
Potassium: 3.9 mmol/L (ref 3.5–5.1)
Sodium: 137 mmol/L (ref 135–145)
Total Bilirubin: 0.4 mg/dL (ref 0.3–1.2)
Total Protein: 6.4 g/dL — ABNORMAL LOW (ref 6.5–8.1)

## 2022-09-01 MED ORDER — SODIUM CHLORIDE (PF) 0.9 % IJ SOLN
INTRAMUSCULAR | Status: AC
  Filled 2022-09-01: qty 50

## 2022-09-01 MED ORDER — IOHEXOL 300 MG/ML  SOLN
75.0000 mL | Freq: Once | INTRAMUSCULAR | Status: AC | PRN
  Administered 2022-09-01: 75 mL via INTRAVENOUS

## 2022-09-06 ENCOUNTER — Inpatient Hospital Stay (HOSPITAL_BASED_OUTPATIENT_CLINIC_OR_DEPARTMENT_OTHER): Payer: 59 | Admitting: Internal Medicine

## 2022-09-06 ENCOUNTER — Other Ambulatory Visit: Payer: Self-pay

## 2022-09-06 VITALS — BP 144/70 | HR 71 | Temp 98.2°F | Resp 14 | Wt 149.8 lb

## 2022-09-06 DIAGNOSIS — C3490 Malignant neoplasm of unspecified part of unspecified bronchus or lung: Secondary | ICD-10-CM

## 2022-09-06 DIAGNOSIS — C3411 Malignant neoplasm of upper lobe, right bronchus or lung: Secondary | ICD-10-CM | POA: Diagnosis not present

## 2022-09-06 NOTE — Progress Notes (Signed)
[facility] [phone]   [phone]  OFFICE PROGRESS NOTE  Alysia Penna, MD [address] [place] [address]  DIAGNOSIS: Stage IIb (T1 a, N1, M0) non-small cell lung cancer, adenocarcinoma presented with right upper lobe lung nodule diagnosed in November 2023. Molecular studies showed positive KRAS G12C mutation and PD-L1 expression of 1%.  PRIOR THERAPY:  1) Status post right upper lobectomy with lymph node dissection under the care of Dr. Dorris Fetch on March 01, 2022. 2) Adjuvant systemic chemotherapy with cisplatin 75 Mg/M2 and Alimta 500 Mg/M2.  First dose May 06, 2022.  Status post 4  cycles.  CURRENT THERAPY: Observation.  INTERVAL HISTORY: Andrea Morgan 72 y.o. female returns to the clinic today for follow-up visit.  The patient is feeling fine today with no concerning complaints except for fatigue.  She denied having any current chest pain, shortness of breath, cough or hemoptysis.  She has no nausea, vomiting, diarrhea or constipation.  She has no headache or visual changes.  She denied having any recent weight loss or night sweats.  She tolerated her previous course of adjuvant systemic chemotherapy fairly well.  She is here for evaluation with repeat CT scan of the chest for restaging of her disease.  MEDICAL HISTORY: Past Medical History:  Diagnosis Date   ADHD (attention deficit hyperactivity disorder)    Adrenal adenoma    Anal condyloma    Anemia    Anxiety    Arthritis    arthritis left jaw / pain     Cancer (HCC) 2013   lung cancer   DDD (degenerative disc disease), thoracic    Depression    Dyspnea    with exertion   Dyspnea on exertion    Emphysema/COPD (HCC)    GERD (gastroesophageal reflux disease)    Hiatal hernia    causes no problems per patient   History of adenomatous polyp of colon    06/ 2016 hyperplastic   History of cancer of lower lobe bronchus or lung followed by dr gerhardt-- lov  01/ 2017   06-02-2011  s/p  Right VATS w/ left mini thoracotomy resection 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)   History of condyloma acuminatum    removal anal canal condyloma 11-08-2013   History of kidney stones    passed stones   History of panic attacks    Hyperlipidemia    Hypertension    Hypothyroidism    Insomnia    Liver cyst    per CT in 2012   PAF (paroxysmal atrial fibrillation) [facility]) cardiologist-  dr Patty Sermons   single episode Atrial Fibrillation w/ RVR post-op day 3 the right colectomy surgery on 11-08-2013       Pneumonia    PONV (postoperative nausea and vomiting)    Pulmonary nodule, right    right upper lobe per last Chest CT 05-14-2015    ALLERGIES:  is allergic to oxycontin [oxycodone], sulfa antibiotics, and morphine and codeine.  MEDICATIONS:  Current Outpatient Medications  Medication Sig Dispense Refill   albuterol (VENTOLIN HFA) 108 (90 Base) MCG/ACT inhaler Inhale 2 puffs into the lungs every 6 (six) hours as needed for wheezing or shortness of breath.     alendronate (FOSAMAX) 70 MG tablet Take 70 mg by mouth See admin instructions. 70 mg once a week on Thursday     ALPRAZolam (XANAX) 1 MG tablet Take 1 mg by mouth 3 (three) times daily as needed  for anxiety.     ASCORBIC ACID PO Take 1 tablet by mouth daily. Vitamin C, unknown strength.     aspirin EC 81 MG tablet Take 81 mg by mouth daily.     atorvastatin (LIPITOR) 20 MG tablet Take 20 mg by mouth at bedtime.      buPROPion (WELLBUTRIN SR) 150 MG 12 hr tablet Take 150 mg by mouth daily.     carvedilol (COREG) 3.125 MG tablet Take 3.125 mg by mouth every morning.     Cholecalciferol (VITAMIN D-3 PO) Take 1 capsule by mouth daily.     Cyanocobalamin (VITAMIN B-12 PO) Take 1 tablet by mouth daily.     desvenlafaxine (PRISTIQ) 50 MG 24 hr tablet Take 50 mg by mouth daily.     dexamethasone (DECADRON) 4 MG tablet Please take 1 tablet twice a day the day before, the day  of, and the day after chemotherapy 40 tablet 2   dexamethasone (DECADRON) 4 MG tablet Take 1 tab 2 times daily starting day before pemetrexed. Then take 2 tabs daily x 3 days starting day after cisplatin. Take with food. 30 tablet 1   diltiazem (CARDIZEM CD) 120 MG 24 hr capsule TAKE 1 CAPSULE (120 MG TOTAL) BY MOUTH DAILY. 30 capsule 7   donepezil (ARICEPT) 5 MG tablet Take 5 mg by mouth at bedtime.     escitalopram (LEXAPRO) 20 MG tablet Take 20 mg by mouth every evening.   3   feeding supplement (ENSURE ENLIVE / ENSURE PLUS) LIQD Take 237 mLs by mouth 3 (three) times daily between meals. 237 mL 12   folic acid (FOLVITE) 1 MG tablet Take 1 tablet (1 mg total) by mouth daily. Start 7 days before pemetrexed chemotherapy. Continue until 21 days after pemetrexed completed. 100 tablet 3   HYDROcodone-acetaminophen (NORCO) 7.5-325 MG tablet Take 1 tablet by mouth every 4 (four) hours as needed for severe pain. 15 tablet 0   levothyroxine (SYNTHROID) 88 MCG tablet Take 88 mcg by mouth every morning.     pantoprazole (PROTONIX) 40 MG tablet Take 40 mg by mouth daily.     potassium chloride SA (KLOR-CON M) 20 MEQ tablet Take 1 tablet (20 mEq total) by mouth daily. 7 tablet 0   pregabalin (LYRICA) 25 MG capsule Take 1 capsule (25 mg total) by mouth 2 (two) times daily. 60 capsule 1   prochlorperazine (COMPAZINE) 10 MG tablet Take 1 tablet (10 mg total) by mouth every 6 (six) hours as needed for nausea or vomiting. 30 tablet 1   SYMBICORT 160-4.5 MCG/ACT inhaler Inhale 1 puff into the lungs 2 (two) times daily.     Tiotropium Bromide-Olodaterol (STIOLTO RESPIMAT) 2.5-2.5 MCG/ACT AERS Inhale 2 puffs into the lungs daily. 4 g 5   zolpidem (AMBIEN) 10 MG tablet Take 10 mg by mouth at bedtime.      No current facility-administered medications for this visit.    SURGICAL HISTORY:  Past Surgical History:  Procedure Laterality Date   APPENDECTOMY  1978   CERVICAL FUSION  1997   C5 -- C7   COLONOSCOPY  last  one 10-17-2014   ESOPHAGOGASTRODUODENOSCOPY  05/21/2011   Procedure: ESOPHAGOGASTRODUODENOSCOPY (EGD);  Surgeon: Theda Belfast, MD;  Location: [facility];  Service: Endoscopy;  Laterality: N/A;   EXAMINATION UNDER ANESTHESIA N/A 11/08/2013   Procedure: ANAL EXAM UNDER ANESTHESIA WITH BIOPSY;  Surgeon: Romie Levee, MD;  Location: [facility];  Service: General;  Laterality: N/A;   INTERCOSTAL NERVE BLOCK Right 03/01/2022  Procedure: INTERCOSTAL NERVE BLOCK;  Surgeon: Loreli Slot, MD;  Location: [facility] OR;  Service: Thoracic;  Laterality: Right;   LAPAROSCOPIC PARTIAL COLECTOMY N/A 11/08/2013   Procedure: laparoscopic partial right colectomy;  Surgeon: Romie Levee, MD;  Location: [facility];  Service: General;  Laterality: N/A;   MASS EXCISION  06/02/2011   Procedure: EXCISION MASS;  Surgeon: Delight Ovens, MD;  Location: [facility] OR;  Service: Thoracic;;  minithoracotomy resection spindle cell tumor right chest   MASS EXCISION N/A 05/06/2016   Procedure: EXCISION ANAL CONDYLOMA;  Surgeon: Romie Levee, MD;  Location: [facility];  Service: General;  Laterality: N/A;   NODE DISSECTION Right 03/01/2022   Procedure: NODE DISSECTION;  Surgeon: Loreli Slot, MD;  Location: [facility] OR;  Service: Thoracic;  Laterality: Right;   THORACIC DISCECTOMY Right 01/13/2016   Procedure: RIGHT THORACIC ONE THORACIC TWO THORACIC LAMINOTOMY AND MICRODISCECTOMY;  Surgeon: Shirlean Kelly, MD;  Location: [facility];  Service: Neurosurgery;  Laterality: Right;   TONSILLECTOMY  1971   TRANSTHORACIC ECHOCARDIOGRAM  08/28/2014   ef 55-60%/  trivial MR and TR   VIDEO BRONCHOSCOPY  05/11/2011   Procedure: VIDEO BRONCHOSCOPY WITH FLUORO;  Surgeon: [name], MD;  Location: [facility];  Service: Cardiopulmonary;  Laterality: Bilateral;   VIDEO BRONCHOSCOPY  06/02/2011   Procedure: VIDEO BRONCHOSCOPY;  Surgeon: Delight Ovens, MD;  Location: [facility] OR;  Service: Thoracic;  Laterality: N/A;    REVIEW  OF SYSTEMS:  Constitutional: positive for fatigue Eyes: negative Ears, nose, mouth, throat, and face: negative Respiratory: negative Cardiovascular: negative Gastrointestinal: negative Genitourinary:negative Integument/breast: negative Hematologic/lymphatic: negative Musculoskeletal:negative Neurological: negative Behavioral/Psych: negative Endocrine: negative Allergic/Immunologic: negative   PHYSICAL EXAMINATION: General appearance: alert, cooperative, fatigued, and no distress Head: Normocephalic, without obvious abnormality, atraumatic Neck: no adenopathy, no JVD, supple, symmetrical, trachea midline, and thyroid not enlarged, symmetric, no tenderness/mass/nodules Lymph nodes: Cervical, supraclavicular, and axillary nodes normal. Resp: clear to auscultation bilaterally Back: symmetric, no curvature. ROM normal. No CVA tenderness. Cardio: regular rate and rhythm, S1, S2 normal, no murmur, click, rub or gallop GI: soft, non-tender; bowel sounds normal; no masses,  no organomegaly Extremities: extremities normal, atraumatic, no cyanosis or edema Neurologic: Alert and oriented X 3, normal strength and tone. Normal symmetric reflexes. Normal coordination and gait  ECOG PERFORMANCE STATUS: 1 - Symptomatic but completely ambulatory  Blood pressure (!) 144/70, pulse 71, temperature 98.2 F (36.8 C), temperature source Oral, resp. rate 14, weight 149 lb 12.8 oz (67.9 kg), SpO2 99 %.  LABORATORY DATA: Lab Results  Component Value Date   WBC 11.1 (H) 09/01/2022   HGB 11.6 (L) 09/01/2022   HCT 34.5 (L) 09/01/2022   MCV 94.3 09/01/2022   PLT 299 09/01/2022      Chemistry      Component Value Date/Time   NA 137 09/01/2022 1333   K 3.9 09/01/2022 1333   CL 102 09/01/2022 1333   CO2 27 09/01/2022 1333   BUN 35 (H) 09/01/2022 1333   CREATININE 1.00 09/01/2022 1333   CREATININE 0.71 04/23/2013 1443      Component Value Date/Time   CALCIUM 8.4 (L) 09/01/2022 1333   ALKPHOS 38  09/01/2022 1333   AST 18 09/01/2022 1333   ALT 22 09/01/2022 1333   BILITOT 0.4 09/01/2022 1333       RADIOGRAPHIC STUDIES: CT Chest W Contrast  Result Date: 09/06/2022 CLINICAL DATA:  Non-small cell lung cancer, staging. * Tracking Code: BO * EXAM: CT CHEST WITH CONTRAST  TECHNIQUE: Multidetector CT imaging of the chest was performed during intravenous contrast administration. RADIATION DOSE REDUCTION: This exam was performed according to the departmental dose-optimization program which includes automated exposure control, adjustment of the mA and/or kV according to patient size and/or use of iterative reconstruction technique. CONTRAST:  75mL OMNIPAQUE IOHEXOL 300 MG/ML  SOLN COMPARISON:  PET 12/07/2021 and CT chest 11/20/2021. FINDINGS: Cardiovascular: Atherosclerotic calcification of the aorta and coronary arteries. Heart size normal. No pericardial effusion. Mediastinum/Nodes: No pathologically enlarged mediastinal, hilar or axillary lymph nodes. Air in the esophagus can be seen with dysmotility. Lungs/Pleura: Interval right upper lobectomy with a small amount of loculated pleural fluid at the apex of the right hemithorax. Pleuroparenchymal scarring in the right hemithorax. Centrilobular and paraseptal emphysema. Additional tiny pulmonary nodules seen on 11/20/2021 a better seen on study. No pleural fluid. Airway is otherwise unremarkable. No pleural fluid. Upper Abdomen: Subcentimeter low-attenuation lesion in the dome of the liver, too small to characterize. Probable small cyst in the posterior right hepatic lobe, unchanged from 12/07/2021. Visualized portions of the liver, gallbladder and right adrenal gland are otherwise unremarkable. Left adrenal mass measures 3.9 cm and 29 Hounsfield units, unchanged from 12/07/2021, at which time it measured -10 Hounsfield units. No specific follow-up necessary. Visualized portions of the kidneys, spleen, pancreas, stomach and bowel are unremarkable with the  exception of a small hiatal hernia. No upper abdominal adenopathy. Musculoskeletal: Degenerative changes in the spine. Slight compression of the T4 superior endplate, old. Old rib fractures. IMPRESSION: 1. Interval right upper lobectomy. Small amount of loculated pleural fluid in the apex of the right hemithorax. No evidence of metastatic disease. 2. Additional tiny pulmonary nodules seen on 11/20/2021 are better seen on that study. Recommend continued attention on follow-up. 3. Left adrenal adenoma. 4. Aortic atherosclerosis (ICD10-I70.0). Coronary artery calcification. Electronically Signed   By: Leanna Battles M.D.   On: 09/06/2022 09:12    ASSESSMENT AND PLAN: This is a very pleasant 72 years old white female with Stage IIb (T1 a, N1, M0) non-small cell lung cancer, adenocarcinoma presented with right upper lobe lung nodule diagnosed in November 2023. Molecular studies showed positive KRAS G12C mutation and PD-L1 expression of 1%.  She is status post right upper lobectomy with lymph node dissection under the care of Dr. Dorris Fetch on March 01, 2022. The patient completed treatment with adjuvant systemic chemotherapy with cisplatin 75 Mg/M2 and Alimta 500 Mg/M2 every 3 weeks.  Status post 4 cycle. The patient tolerated the previous course of adjuvant systemic chemotherapy fairly well except for the fatigue. She had repeat CT scan of the chest performed recently.  I personally and independently reviewed the scan and discussed the result with the patient today. Her scan showed no concerning findings for disease recurrence or metastasis. Her PD-L1 expression is 1% and I do not see any convincing evidence to treat this patient with 1 year of immunotherapy in the adjuvant setting because most of the benefit seen in the clinical trial was for patient with higher PD-L1 expression.  I discussed this option with the patient and she is in agreement to continue on observation. I will see her back for follow-up  visit in 4 months for evaluation with repeat CT scan of the chest for restaging of her disease. The patient was advised to call immediately if she has any other concerning symptoms in the interval. The patient voices understanding of current disease status and treatment options and is in agreement with the current care plan.  All questions were answered. The patient knows to call the clinic with any problems, questions or concerns. We can certainly see the patient much sooner if necessary.  The total time spent in the appointment was 30 minutes.  Disclaimer: This note was dictated with voice recognition software. Similar sounding words can inadvertently be transcribed and may not be corrected upon review.

## 2022-09-08 ENCOUNTER — Other Ambulatory Visit: Payer: Self-pay

## 2022-10-15 ENCOUNTER — Encounter: Payer: Self-pay | Admitting: Internal Medicine

## 2022-10-15 ENCOUNTER — Emergency Department (HOSPITAL_BASED_OUTPATIENT_CLINIC_OR_DEPARTMENT_OTHER): Payer: 59

## 2022-10-15 ENCOUNTER — Encounter (HOSPITAL_BASED_OUTPATIENT_CLINIC_OR_DEPARTMENT_OTHER): Payer: Self-pay | Admitting: Emergency Medicine

## 2022-10-15 ENCOUNTER — Other Ambulatory Visit (HOSPITAL_BASED_OUTPATIENT_CLINIC_OR_DEPARTMENT_OTHER): Payer: Self-pay

## 2022-10-15 ENCOUNTER — Other Ambulatory Visit: Payer: Self-pay

## 2022-10-15 ENCOUNTER — Emergency Department (HOSPITAL_BASED_OUTPATIENT_CLINIC_OR_DEPARTMENT_OTHER)
Admission: EM | Admit: 2022-10-15 | Discharge: 2022-10-15 | Disposition: A | Discharge status: Home / Self Care | Payer: 59 | Attending: Emergency Medicine | Admitting: Emergency Medicine

## 2022-10-15 DIAGNOSIS — Z7982 Long term (current) use of aspirin: Secondary | ICD-10-CM | POA: Insufficient documentation

## 2022-10-15 DIAGNOSIS — Z79899 Other long term (current) drug therapy: Secondary | ICD-10-CM | POA: Diagnosis not present

## 2022-10-15 DIAGNOSIS — Y9301 Activity, walking, marching and hiking: Secondary | ICD-10-CM | POA: Insufficient documentation

## 2022-10-15 DIAGNOSIS — E871 Hypo-osmolality and hyponatremia: Secondary | ICD-10-CM | POA: Diagnosis not present

## 2022-10-15 DIAGNOSIS — S01111A Laceration without foreign body of right eyelid and periocular area, initial encounter: Secondary | ICD-10-CM | POA: Insufficient documentation

## 2022-10-15 DIAGNOSIS — E860 Dehydration: Secondary | ICD-10-CM | POA: Insufficient documentation

## 2022-10-15 DIAGNOSIS — S0990XA Unspecified injury of head, initial encounter: Secondary | ICD-10-CM | POA: Diagnosis not present

## 2022-10-15 DIAGNOSIS — M25561 Pain in right knee: Secondary | ICD-10-CM | POA: Insufficient documentation

## 2022-10-15 DIAGNOSIS — Z85118 Personal history of other malignant neoplasm of bronchus and lung: Secondary | ICD-10-CM | POA: Diagnosis not present

## 2022-10-15 DIAGNOSIS — D72829 Elevated white blood cell count, unspecified: Secondary | ICD-10-CM | POA: Diagnosis not present

## 2022-10-15 DIAGNOSIS — Y92524 Gas station as the place of occurrence of the external cause: Secondary | ICD-10-CM | POA: Diagnosis not present

## 2022-10-15 DIAGNOSIS — W19XXXA Unspecified fall, initial encounter: Secondary | ICD-10-CM

## 2022-10-15 DIAGNOSIS — J449 Chronic obstructive pulmonary disease, unspecified: Secondary | ICD-10-CM | POA: Insufficient documentation

## 2022-10-15 DIAGNOSIS — S0993XA Unspecified injury of face, initial encounter: Secondary | ICD-10-CM | POA: Diagnosis present

## 2022-10-15 DIAGNOSIS — W01198A Fall on same level from slipping, tripping and stumbling with subsequent striking against other object, initial encounter: Secondary | ICD-10-CM | POA: Diagnosis not present

## 2022-10-15 LAB — COMPREHENSIVE METABOLIC PANEL
ALT: 18 U/L (ref 0–44)
AST: 14 U/L — ABNORMAL LOW (ref 15–41)
Albumin: 3.7 g/dL (ref 3.5–5.0)
Alkaline Phosphatase: 37 U/L — ABNORMAL LOW (ref 38–126)
Anion gap: 10 (ref 5–15)
BUN: 26 mg/dL — ABNORMAL HIGH (ref 8–23)
CO2: 25 mmol/L (ref 22–32)
Calcium: 8.5 mg/dL — ABNORMAL LOW (ref 8.9–10.3)
Chloride: 96 mmol/L — ABNORMAL LOW (ref 98–111)
Creatinine, Ser: 0.83 mg/dL (ref 0.44–1.00)
GFR, Estimated: >60 mL/min (ref >60)
Glucose, Bld: 120 mg/dL — ABNORMAL HIGH (ref 70–99)
Potassium: 4 mmol/L (ref 3.5–5.1)
Sodium: 131 mmol/L — ABNORMAL LOW (ref 135–145)
Total Bilirubin: 0.4 mg/dL (ref 0.3–1.2)
Total Protein: 5.9 g/dL — ABNORMAL LOW (ref 6.5–8.1)

## 2022-10-15 LAB — MAGNESIUM: Magnesium: 1.8 mg/dL (ref 1.7–2.4)

## 2022-10-15 LAB — CBC WITH DIFFERENTIAL/PLATELET
Abs Immature Granulocytes: 1.61 10*3/uL — ABNORMAL HIGH (ref 0.00–0.07)
Basophils Absolute: 0.1 10*3/uL (ref 0.0–0.1)
Basophils Relative: 1 %
Eosinophils Absolute: 0 10*3/uL (ref 0.0–0.5)
Eosinophils Relative: 0 %
HCT: 33.7 % — ABNORMAL LOW (ref 36.0–46.0)
Hemoglobin: 11.6 g/dL — ABNORMAL LOW (ref 12.0–15.0)
Immature Granulocytes: 10 %
Lymphocytes Relative: 6 %
Lymphs Abs: 1 10*3/uL (ref 0.7–4.0)
MCH: 31.4 pg (ref 26.0–34.0)
MCHC: 34.4 g/dL (ref 30.0–36.0)
MCV: 91.1 fL (ref 80.0–100.0)
Monocytes Absolute: 0.6 10*3/uL (ref 0.1–1.0)
Monocytes Relative: 4 %
Neutro Abs: 12.3 10*3/uL — ABNORMAL HIGH (ref 1.7–7.7)
Neutrophils Relative %: 79 %
Platelets: 362 10*3/uL (ref 150–400)
RBC: 3.7 MIL/uL — ABNORMAL LOW (ref 3.87–5.11)
RDW: 15.3 % (ref 11.5–15.5)
WBC: 15.6 10*3/uL — ABNORMAL HIGH (ref 4.0–10.5)
nRBC: 0.1 % (ref 0.0–0.2)

## 2022-10-15 LAB — URINALYSIS, ROUTINE W REFLEX MICROSCOPIC
Bilirubin Urine: NEGATIVE
Glucose, UA: NEGATIVE mg/dL
Hgb urine dipstick: NEGATIVE
Ketones, ur: NEGATIVE mg/dL
Leukocytes,Ua: NEGATIVE
Nitrite: NEGATIVE
Specific Gravity, Urine: 1.022 (ref 1.005–1.030)
pH: 6 (ref 5.0–8.0)

## 2022-10-15 MED ORDER — LIDOCAINE HCL (PF) 1 % IJ SOLN
5.0000 mL | Freq: Once | INTRAMUSCULAR | Status: AC
  Administered 2022-10-15: 5 mL
  Filled 2022-10-15: qty 5

## 2022-10-15 MED ORDER — SODIUM CHLORIDE 0.9 % IV BOLUS
1000.0000 mL | Freq: Once | INTRAVENOUS | Status: AC
  Administered 2022-10-15: 1000 mL via INTRAVENOUS

## 2022-10-15 NOTE — ED Notes (Signed)
Patient transported to CT 

## 2022-10-15 NOTE — ED Notes (Signed)
Discharge instructions, follow up care, and wound care reviewed and explained. Pt verbalized understanding and had no further questions on d/c. Pt caox4 and ambulatory on d/c.

## 2022-10-15 NOTE — ED Notes (Signed)
Pt ambulatory with slow gait to restroom ?

## 2022-10-15 NOTE — Discharge Instructions (Signed)
You were seen in the emergency department after your fall.  Your workup showed that you are mildly dehydrated but otherwise you had no signs of infection or abnormal heart rhythms to cause your fall.  Your workup showed no signs of injury to your brain or broken bones from the fall.  You do have a cut to your eyebrow that was repaired with 6 stitches.  These can be removed by your primary doctor, urgent care or in the ER in 3 to 5 days.  You should follow-up with your primary doctor to have your symptoms rechecked.  You should return to the emergency department if you have severe headaches, repetitive vomiting, recurrent episodes of passing out, pus draining from your wound or any other new or concerning symptoms.

## 2022-10-15 NOTE — ED Provider Notes (Signed)
[facility] AT [facility] ([facility]) Provider Note   CSN: [id number] Arrival date & time: 10/15/22  1041     History  Chief Complaint  Patient presents with   Loss of Consciousness    Andrea Morgan is a 72 y.o. female.  Patient is a 72 year old female with a past medical history of lung cancer status post resection and chemotherapy, COPD, A-fib not on anticoagulation presenting to the emergency department with a fall.  The patient states that she was at the gas station and was walking into the station to pay for gas when she suddenly felt herself falling and next thing she knew she was on the ground.  She states that she did hit her head and thinks that she may have lost consciousness.  She states that she also landed on her right knee.  She denies any headaches, nausea or vomiting since the fall and states that she was able to ambulate after the fall.  Per the triage note she reported blurred vision but she denies this to myself.  The patient states that she has been feeling generally weak and fatigued recently and thinks that she does not drink enough water but denies any recent fevers, nausea, vomiting or diarrhea, black or bloody stools. She states her tetanus is up to date.  The history is provided by the patient.  Loss of Consciousness      Home Medications Prior to Admission medications   Medication Sig Start Date End Date Taking? Authorizing Provider  albuterol (VENTOLIN HFA) 108 (90 Base) MCG/ACT inhaler Inhale 2 puffs into the lungs every 6 (six) hours as needed for wheezing or shortness of breath. 10/01/13   [provider]  alendronate (FOSAMAX) 70 MG tablet Take 70 mg by mouth See admin instructions. 70 mg once a week on Thursday    [provider]  ALPRAZolam Prudy Feeler) 1 MG tablet Take 1 mg by mouth 3 (three) times daily as needed for anxiety.    [provider]  ASCORBIC ACID PO Take 1 tablet by mouth daily. Vitamin C,  unknown strength.    [provider]  aspirin EC 81 MG tablet Take 81 mg by mouth daily.    [provider]  atorvastatin (LIPITOR) 20 MG tablet Take 20 mg by mouth at bedtime.     [provider]  buPROPion (WELLBUTRIN SR) 150 MG 12 hr tablet Take 150 mg by mouth daily.    [provider]  carvedilol (COREG) 3.125 MG tablet Take 3.125 mg by mouth every morning.    [provider]  Cholecalciferol (VITAMIN D-3 PO) Take 1 capsule by mouth daily.    [provider]  Cyanocobalamin (VITAMIN B-12 PO) Take 1 tablet by mouth daily.    [provider]  desvenlafaxine (PRISTIQ) 50 MG 24 hr tablet Take 50 mg by mouth daily.    [provider]  dexamethasone (DECADRON) 4 MG tablet Please take 1 tablet twice a day the day before, the day of, and the day after chemotherapy 04/29/22   Heilingoetter, Cassandra L, PA-C  dexamethasone (DECADRON) 4 MG tablet Take 1 tab 2 times daily starting day before pemetrexed. Then take 2 tabs daily x 3 days starting day after cisplatin. Take with food. 05/04/22   Si Gaul, MD  diltiazem (CARDIZEM CD) 120 MG 24 hr capsule TAKE 1 CAPSULE (120 MG TOTAL) BY MOUTH DAILY. 09/06/16   Rosalio Macadamia, NP  donepezil (ARICEPT) 5 MG tablet Take 5 mg  by mouth at bedtime.    [provider]  escitalopram (LEXAPRO) 20 MG tablet Take 20 mg by mouth every evening.  12/11/15   [provider]  feeding supplement (ENSURE ENLIVE / ENSURE PLUS) LIQD Take 237 mLs by mouth 3 (three) times daily between meals. 03/08/22   Barrett, Erin R, PA-C  folic acid (FOLVITE) 1 MG tablet Take 1 tablet (1 mg total) by mouth daily. Start 7 days before pemetrexed chemotherapy. Continue until 21 days after pemetrexed completed. 05/04/22   Si Gaul, MD  HYDROcodone-acetaminophen (NORCO) 7.5-325 MG tablet Take 1 tablet by mouth every 4 (four) hours as needed for severe pain. 03/30/22   Loreli Slot, MD   levothyroxine (SYNTHROID) 88 MCG tablet Take 88 mcg by mouth every morning. 08/18/20   [provider]  pantoprazole (PROTONIX) 40 MG tablet Take 40 mg by mouth daily.    [provider]  potassium chloride SA (KLOR-CON M) 20 MEQ tablet Take 1 tablet (20 mEq total) by mouth daily. 05/05/22   Heilingoetter, Cassandra L, PA-C  pregabalin (LYRICA) 25 MG capsule Take 1 capsule (25 mg total) by mouth 2 (two) times daily. 03/15/22   Loreli Slot, MD  prochlorperazine (COMPAZINE) 10 MG tablet Take 1 tablet (10 mg total) by mouth every 6 (six) hours as needed for nausea or vomiting. 05/04/22   Si Gaul, MD  SYMBICORT 160-4.5 MCG/ACT inhaler Inhale 1 puff into the lungs 2 (two) times daily. 05/31/22   [provider]  Tiotropium Bromide-Olodaterol (STIOLTO RESPIMAT) 2.5-2.5 MCG/ACT AERS Inhale 2 puffs into the lungs daily. 03/30/22   [name], MD  zolpidem (AMBIEN) 10 MG tablet Take 10 mg by mouth at bedtime.     [provider]      Allergies    Oxycontin [oxycodone], Sulfa antibiotics, and Morphine and codeine    Review of Systems   Review of Systems  Cardiovascular:  Positive for syncope.    Physical Exam Updated Vital Signs BP (!) 171/71   Pulse 76   Temp 98.7 F (37.1 C) (Oral)   Resp 19   Ht 5' 6.5" (1.689 m)   Wt 67.1 kg   SpO2 99%   BMI 23.53 kg/m  Physical Exam Vitals and nursing note reviewed.  Constitutional:      General: She is not in acute distress.    Appearance: Normal appearance.  HENT:     Head: Normocephalic.     Comments: ~3cm gaping, non-bleeding laceration to R eyebrow No facial bony tenderness to palpation    Nose: Nose normal.     Mouth/Throat:     Mouth: Mucous membranes are moist.     Pharynx: Oropharynx is clear.  Eyes:     Extraocular Movements: Extraocular movements intact.     Conjunctiva/sclera: Conjunctivae normal.     Pupils: Pupils are equal, round, and reactive to light.  Cardiovascular:      Rate and Rhythm: Normal rate. Rhythm irregular.     Pulses: Normal pulses.     Heart sounds: Normal heart sounds.  Pulmonary:     Effort: Pulmonary effort is normal.     Breath sounds: Normal breath sounds.  Abdominal:     General: Abdomen is flat.     Palpations: Abdomen is soft.     Tenderness: There is no abdominal tenderness.  Musculoskeletal:        General: Normal range of motion.     Cervical back: Normal range of motion and neck supple.  Comments: No midline neck or back tenderness No bony tenderness to bilateral UE Pelvis stable, non-tender Tenderness to palpation of R knee with mild swelling and overlying non-bleeding abrasion, ROM intact No tenderness to palpation of LLE  Skin:    General: Skin is warm and dry.  Neurological:     General: No focal deficit present.     Mental Status: She is alert and oriented to person, place, and time.     Sensory: No sensory deficit.     Motor: No weakness.  Psychiatric:        Mood and Affect: Mood normal.        Behavior: Behavior normal.     ED Results / Procedures / Treatments   Labs (all labs ordered are listed, but only abnormal results are displayed) Labs Reviewed  CBC WITH DIFFERENTIAL/PLATELET - Abnormal; Notable for the following components:      Result Value   WBC 15.6 (*)    RBC 3.70 (*)    Hemoglobin 11.6 (*)    HCT 33.7 (*)    Neutro Abs 12.3 (*)    Abs Immature Granulocytes 1.61 (*)    All other components within normal limits  COMPREHENSIVE METABOLIC PANEL - Abnormal; Notable for the following components:   Sodium 131 (*)    Chloride 96 (*)    Glucose, Bld 120 (*)    BUN 26 (*)    Calcium 8.5 (*)    Total Protein 5.9 (*)    AST 14 (*)    Alkaline Phosphatase 37 (*)    All other components within normal limits  URINALYSIS, ROUTINE W REFLEX MICROSCOPIC - Abnormal; Notable for the following components:   Protein, ur TRACE (*)    All other components within normal limits  MAGNESIUM     EKG EKG Interpretation Date/Time:  Friday October 15 2022 10:57:07 EDT Ventricular Rate:  79 PR Interval:  53 QRS Duration:  73 QT Interval:  363 QTC Calculation: 417 R Axis:   17  Text Interpretation: Sinus rhythm Multiple premature complexes, vent & supraven Short PR interval Abnormal R-wave progression, early transition PVCs otherwise unchanged from prior EKG Confirmed by Elayne Snare ([id number]) on 10/15/2022 11:01:53 AM  Radiology CT Head Wo Contrast  Result Date: 10/15/2022 CLINICAL DATA:  Head trauma, minor (Age >= 65y); Neck trauma (Age >= 65y). EXAM: CT HEAD WITHOUT CONTRAST CT CERVICAL SPINE WITHOUT CONTRAST TECHNIQUE: Multidetector CT imaging of the head and cervical spine was performed following the standard protocol without intravenous contrast. Multiplanar CT image reconstructions of the cervical spine were also generated. RADIATION DOSE REDUCTION: This exam was performed according to the departmental dose-optimization program which includes automated exposure control, adjustment of the mA and/or kV according to patient size and/or use of iterative reconstruction technique. COMPARISON:  Chest CT 09/01/2022. MRI brain 04/21/2022. Head CT 12/06/2019. FINDINGS: CT HEAD FINDINGS Brain: No acute intracranial hemorrhage. Gray-white differentiation is preserved. No hydrocephalus or extra-axial collection. No mass effect or midline shift. Vascular: No hyperdense vessel or unexpected calcification. Skull: No calvarial fracture or suspicious bone lesion. Skull base is unremarkable. Sinuses/Orbits: Unremarkable. Other: Small scalp laceration along the right supraorbital ridge. CT CERVICAL SPINE FINDINGS Alignment: Normal. Skull base and vertebrae: Acquired fusion from C5-C7. No acute fracture. Normal craniocervical junction. No suspicious bone lesions. Soft tissues and spinal canal: No prevertebral fluid or swelling. No visible canal hematoma. Disc levels: Mild cervical spondylosis without  high-grade spinal canal stenosis. Upper chest: Similar postoperative changes from prior right upper lobectomy.  Other: Atherosclerotic calcifications of the carotid bulbs. IMPRESSION: 1. No acute intracranial abnormality. Small scalp laceration along the right supraorbital ridge. 2. No acute cervical spine fracture or traumatic malalignment. Electronically Signed   By: Orvan Falconer M.D.   On: 10/15/2022 12:45   CT Cervical Spine Wo Contrast  Result Date: 10/15/2022 CLINICAL DATA:  Head trauma, minor (Age >= 65y); Neck trauma (Age >= 65y). EXAM: CT HEAD WITHOUT CONTRAST CT CERVICAL SPINE WITHOUT CONTRAST TECHNIQUE: Multidetector CT imaging of the head and cervical spine was performed following the standard protocol without intravenous contrast. Multiplanar CT image reconstructions of the cervical spine were also generated. RADIATION DOSE REDUCTION: This exam was performed according to the departmental dose-optimization program which includes automated exposure control, adjustment of the mA and/or kV according to patient size and/or use of iterative reconstruction technique. COMPARISON:  Chest CT 09/01/2022. MRI brain 04/21/2022. Head CT 12/06/2019. FINDINGS: CT HEAD FINDINGS Brain: No acute intracranial hemorrhage. Gray-white differentiation is preserved. No hydrocephalus or extra-axial collection. No mass effect or midline shift. Vascular: No hyperdense vessel or unexpected calcification. Skull: No calvarial fracture or suspicious bone lesion. Skull base is unremarkable. Sinuses/Orbits: Unremarkable. Other: Small scalp laceration along the right supraorbital ridge. CT CERVICAL SPINE FINDINGS Alignment: Normal. Skull base and vertebrae: Acquired fusion from C5-C7. No acute fracture. Normal craniocervical junction. No suspicious bone lesions. Soft tissues and spinal canal: No prevertebral fluid or swelling. No visible canal hematoma. Disc levels: Mild cervical spondylosis without high-grade spinal canal  stenosis. Upper chest: Similar postoperative changes from prior right upper lobectomy. Other: Atherosclerotic calcifications of the carotid bulbs. IMPRESSION: 1. No acute intracranial abnormality. Small scalp laceration along the right supraorbital ridge. 2. No acute cervical spine fracture or traumatic malalignment. Electronically Signed   By: Orvan Falconer M.D.   On: 10/15/2022 12:45   DG Chest Port 1 View  Result Date: 10/15/2022 CLINICAL DATA:  syncopal fall EXAM: PORTABLE CHEST 1 VIEW COMPARISON:  CXR 08/04/22 FINDINGS: No pleural effusion. No pneumothorax. No focal airspace opacity. Unchanged cardiac and mediastinal contours. No radiographically apparent displaced rib fractures. Visualized upper abdomen is unremarkable. IMPRESSION: No focal airspace opacity. Electronically Signed   By: Lorenza Cambridge M.D.   On: 10/15/2022 12:26   DG Knee Complete 4 Views Right  Result Date: 10/15/2022 CLINICAL DATA:  Syncope.  Fall. EXAM: RIGHT KNEE - COMPLETE 4+ VIEW COMPARISON:  Right knee radiographs 12/06/2019 FINDINGS: No acute fracture or dislocation is identified. A small knee joint effusion is questioned. Mild tricompartmental marginal osteophytosis is noted. IMPRESSION: No acute osseous abnormality. Electronically Signed   By: Sebastian Ache M.D.   On: 10/15/2022 12:24    Procedures .Marland KitchenLaceration Repair  Date/Time: 10/15/2022 1:10 PM  Performed by: Rexford Maus, DO Authorized by: Rexford Maus, DO   Consent:    Consent obtained:  Verbal   Consent given by:  Patient   Risks, benefits, and alternatives were discussed: yes     Risks discussed:  Infection, pain, need for additional repair, poor cosmetic result, retained foreign body, tendon damage, nerve damage and poor wound healing   Alternatives discussed:  No treatment, delayed treatment and observation Universal protocol:    Procedure explained and questions answered to patient or proxy's satisfaction: yes     Patient identity  confirmed:  Verbally with patient Anesthesia:    Anesthesia method:  Local infiltration   Local anesthetic:  Lidocaine 1% w/o epi Laceration details:    Location:  Face   Face location:  R  eyebrow   Length (cm):  3   Depth (mm):  3 Exploration:    Limited defect created (wound extended): no     Hemostasis achieved with:  Direct pressure   Imaging obtained comment:  CTH   Imaging outcome: foreign body not noted     Wound exploration: wound explored through full range of motion and entire depth of wound visualized     Wound extent: areolar tissue not violated, fascia not violated, no foreign body, no signs of injury, no nerve damage, no tendon damage, no underlying fracture and no vascular damage     Contaminated: no   Treatment:    Area cleansed with:  Saline   Amount of cleaning:  Standard   Irrigation solution:  Sterile saline   Irrigation method:  Pressure wash   Visualized foreign bodies/material removed: no     Debridement:  None   Undermining:  None   Scar revision: no   Skin repair:    Repair method:  Sutures   Suture size:  5-0   Suture material:  Prolene   Suture technique:  Simple interrupted   Number of sutures:  6 Approximation:    Approximation:  Close Repair type:    Repair type:  Simple Post-procedure details:    Dressing:  Open (no dressing)   Procedure completion:  Tolerated well, no immediate complications     Medications Ordered in ED Medications  lidocaine (PF) (XYLOCAINE) 1 % injection 5 mL (5 mLs Infiltration Given by Other 10/15/22 1243)  sodium chloride 0.9 % bolus 1,000 mL (1,000 mLs Intravenous New Bag/Given 10/15/22 1245)    ED Course/ Medical Decision Making/ A&P Clinical Course as of 10/15/22 1436  Fri Oct 15, 2022  1223 Mild hyponatremia and will be given IVF. Leukocytosis, which does appear to be somewhat baseline for her, UA pending.  [VK]  1309 No acute traumatic injury on CT imaging. UA pending. [VK]  1433 UA negative for UTI.  She  is stable for discharge home with primary care follow-up and was given strict return precautions. [VK]    Clinical Course User Index [VK] Rexford Maus, DO                             Medical Decision Making This patient presents to the ED with chief complaint(s) of fall with pertinent past medical history of lung cancer s/p resection & chemotherapy, a fib not on [facility], COPD which further complicates the presenting complaint. The complaint involves an extensive differential diagnosis and also carries with it a high risk of complications and morbidity.    The differential diagnosis includes ICH, mass effect, cervical spine fracture, knee fracture, no other traumatic injuries seen on exam, laceration, abrasion, considering syncopal fall, arrhythmia, anemia, dehydration, electrolyte abnormality, no chest pain or shortness of breath making ACS unlikely  Additional history obtained: Additional history obtained from N/A Records reviewed outpatient oncology records  ED Course and Reassessment: On patient's arrival to the emergency department she is hemodynamically stable in no acute distress.  The patient does have evidence of head trauma with a forehead laceration and will have head CT and C-spine performed due to her age and trauma.  She is also having right knee pain and will have x-ray performed.  EKG performed on arrival showed normal sinus rhythm with PVCs without acute ischemic changes.  She had labs including electrolytes and chest x-ray to further evaluate for potential cause for  syncope.  She will require laceration repair.  Independent labs interpretation:  The following labs were independently interpreted: leukocytosis, mild hyponatremia  Independent visualization of imaging: - I independently visualized the following imaging with scope of interpretation limited to determining acute life threatening conditions related to emergency care: CTH/C-spine, CXR, R knee XR, which revealed no  acute traumatic injury  Consultation: - Consulted or discussed management/test interpretation w/ external professional: N/A  Consideration for admission or further workup: Patient has no emergent conditions requiring admission or further work-up at this time and is stable for discharge home with primary care follow-up  Social Determinants of health: N/A    Amount and/or Complexity of Data Reviewed Labs: ordered. Radiology: ordered.  Risk Prescription drug management.          Final Clinical Impression(s) / ED Diagnoses Final diagnoses:  Fall, initial encounter  Laceration of right eyebrow, initial encounter  Mild dehydration    Rx / DC Orders ED Discharge Orders     None         Rexford Maus, DO 10/15/22 1436

## 2022-10-15 NOTE — ED Notes (Signed)
ED Provider at bedside for suture repair. 

## 2022-10-15 NOTE — ED Triage Notes (Signed)
Pt arrives pov, to triage in wheelchair with c/o syncope episode while at store this morning pta. Lac note above RT eye. Pt denies CP or change in shob. Denies neck pain. Endorses RT eye blurred vision. Reports having lung cancer. PT AOx4. GCS 15

## 2022-12-30 ENCOUNTER — Telehealth: Payer: Self-pay | Admitting: Medical Oncology

## 2022-12-30 NOTE — Telephone Encounter (Signed)
Called pt about next appt.She said she appreciated the call to remind her.

## 2023-01-03 ENCOUNTER — Ambulatory Visit (HOSPITAL_COMMUNITY): Admission: RE | Admit: 2023-01-03 | Payer: 59 | Source: Ambulatory Visit

## 2023-01-03 ENCOUNTER — Inpatient Hospital Stay: Payer: 59 | Attending: Internal Medicine

## 2023-01-03 DIAGNOSIS — C3490 Malignant neoplasm of unspecified part of unspecified bronchus or lung: Secondary | ICD-10-CM

## 2023-01-03 DIAGNOSIS — C3411 Malignant neoplasm of upper lobe, right bronchus or lung: Secondary | ICD-10-CM | POA: Insufficient documentation

## 2023-01-03 LAB — CBC WITH DIFFERENTIAL (CANCER CENTER ONLY)
Abs Immature Granulocytes: 0.02 10*3/uL (ref 0.00–0.07)
Basophils Absolute: 0.1 10*3/uL (ref 0.0–0.1)
Basophils Relative: 1 %
Eosinophils Absolute: 0.2 10*3/uL (ref 0.0–0.5)
Eosinophils Relative: 3 %
HCT: 35.5 % — ABNORMAL LOW (ref 36.0–46.0)
Hemoglobin: 11.4 g/dL — ABNORMAL LOW (ref 12.0–15.0)
Immature Granulocytes: 0 %
Lymphocytes Relative: 38 %
Lymphs Abs: 2.6 10*3/uL (ref 0.7–4.0)
MCH: 27.9 pg (ref 26.0–34.0)
MCHC: 32.1 g/dL (ref 30.0–36.0)
MCV: 86.8 fL (ref 80.0–100.0)
Monocytes Absolute: 0.5 10*3/uL (ref 0.1–1.0)
Monocytes Relative: 7 %
Neutro Abs: 3.6 10*3/uL (ref 1.7–7.7)
Neutrophils Relative %: 51 %
Platelet Count: 397 10*3/uL (ref 150–400)
RBC: 4.09 MIL/uL (ref 3.87–5.11)
RDW: 15.1 % (ref 11.5–15.5)
WBC Count: 7 10*3/uL (ref 4.0–10.5)
nRBC: 0 % (ref 0.0–0.2)

## 2023-01-03 LAB — CMP (CANCER CENTER ONLY)
ALT: 22 U/L (ref 0–44)
AST: 25 U/L (ref 15–41)
Albumin: 3.5 g/dL (ref 3.5–5.0)
Alkaline Phosphatase: 50 U/L (ref 38–126)
Anion gap: 5 (ref 5–15)
BUN: 13 mg/dL (ref 8–23)
CO2: 30 mmol/L (ref 22–32)
Calcium: 9.1 mg/dL (ref 8.9–10.3)
Chloride: 106 mmol/L (ref 98–111)
Creatinine: 1.07 mg/dL — ABNORMAL HIGH (ref 0.44–1.00)
GFR, Estimated: 55 mL/min — ABNORMAL LOW (ref >60)
Glucose, Bld: 95 mg/dL (ref 70–99)
Potassium: 3.9 mmol/L (ref 3.5–5.1)
Sodium: 141 mmol/L (ref 135–145)
Total Bilirubin: 0.4 mg/dL (ref 0.3–1.2)
Total Protein: 6.1 g/dL — ABNORMAL LOW (ref 6.5–8.1)

## 2023-01-05 ENCOUNTER — Inpatient Hospital Stay: Payer: 59 | Admitting: Internal Medicine

## 2023-01-05 ENCOUNTER — Telehealth: Payer: Self-pay | Admitting: Medical Oncology

## 2023-01-05 NOTE — Telephone Encounter (Signed)
F/u appt 09/18 cancelled-Pt  has to  r/s CT scan. She said she thought her appt on Monday was at 1430 instead of 1200 . CT appt cancelled "because I was late".

## 2023-01-08 ENCOUNTER — Other Ambulatory Visit: Payer: Self-pay

## 2023-01-11 ENCOUNTER — Encounter: Payer: Self-pay | Admitting: Internal Medicine

## 2023-01-11 ENCOUNTER — Other Ambulatory Visit: Payer: Self-pay

## 2023-01-13 ENCOUNTER — Ambulatory Visit (HOSPITAL_COMMUNITY): Payer: 59

## 2023-01-14 ENCOUNTER — Telehealth: Payer: Self-pay | Admitting: Internal Medicine

## 2023-01-14 ENCOUNTER — Ambulatory Visit (HOSPITAL_COMMUNITY): Payer: 59

## 2023-01-14 NOTE — Telephone Encounter (Signed)
Scheduled per per 09/24 scheduling message, patient has been called and voicemail was full. Will attempt to contact again, calendar will be mailed.

## 2023-01-21 ENCOUNTER — Ambulatory Visit (HOSPITAL_COMMUNITY)
Admission: RE | Admit: 2023-01-21 | Discharge: 2023-01-21 | Disposition: A | Discharge status: Home / Self Care | Payer: 59 | Source: Ambulatory Visit | Attending: Internal Medicine | Admitting: Internal Medicine

## 2023-01-21 DIAGNOSIS — C3490 Malignant neoplasm of unspecified part of unspecified bronchus or lung: Secondary | ICD-10-CM | POA: Insufficient documentation

## 2023-01-21 MED ORDER — IOHEXOL 300 MG/ML  SOLN
75.0000 mL | Freq: Once | INTRAMUSCULAR | Status: AC | PRN
  Administered 2023-01-21: 75 mL via INTRAVENOUS

## 2023-01-26 NOTE — Progress Notes (Signed)
[facility] OFFICE PROGRESS NOTE  Alysia Penna, MD [address] [place] [address]  DIAGNOSIS: Stage IIb (T1 a, N1, M0) non-small cell lung cancer, adenocarcinoma presented with right upper lobe lung nodule diagnosed in November 2023. Molecular studies showed positive KRAS G12C mutation and PD-L1 expression of 1%.  PRIOR THERAPY:  1) Status post right upper lobectomy with lymph node dissection under the care of Dr. Dorris Fetch on March 01, 2022. 2) Adjuvant systemic chemotherapy with cisplatin 75 Mg/M2 and Alimta 500 Mg/M2.  First dose May 06, 2022.  Status post 4  cycles.   CURRENT THERAPY: Observation   INTERVAL HISTORY: Andrea Morgan 72 y.o. female returns to clinic today for a follow-up visit.  She was last seen by Dr. Arbutus Ped in May 2024.  She is on observation for her history of stage II lung cancer for which she underwent surgery followed by adjuvant chemotherapy.  She denies any major changes in her health since she was last seen. She is expected to see a psychiatrist next month due to lack of interest/depression, which she is looking forward to. She denies any fever, chills, night sweats, or unexplained weight loss. She had a few week period with nausea/vomiting about 1 month ago. She was not sure if associated with her acid reflux. However, she had an episode last week. After she threw up, she felt better. She gets dyspnea on exertion since getting surgery but nothing major, she can just tell when she climbs stairs that her breathing is not what it was prior to surgery. She denies any chest pain, cough, or hemoptysis.  Denies any diarrhea or constipation.  Denies any headache or visual changes.  She recently had a restaging CT scan performed.  She is here today for evaluation to review her scan results.   MEDICAL HISTORY: Past Medical History:  Diagnosis Date   ADHD (attention deficit hyperactivity disorder)    Adrenal adenoma    Anal condyloma     Anemia    Anxiety    Arthritis    arthritis left jaw / pain     Cancer (HCC) 2013   lung cancer   DDD (degenerative disc disease), thoracic    Depression    Dyspnea    with exertion   Dyspnea on exertion    Emphysema/COPD (HCC)    GERD (gastroesophageal reflux disease)    Hiatal hernia    causes no problems per patient   History of adenomatous polyp of colon    06/ 2016 hyperplastic   History of cancer of lower lobe bronchus or lung followed by dr gerhardt-- lov 01/ 2017   06-02-2011  s/p  Right VATS w/ left mini thoracotomy resection 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)   History of condyloma acuminatum    removal anal canal condyloma 11-08-2013   History of kidney stones    passed stones   History of panic attacks    Hyperlipidemia    Hypertension    Hypothyroidism    Insomnia    Liver cyst    per CT in 2012   PAF (paroxysmal atrial fibrillation) [facility]) cardiologist-  dr Patty Sermons   single episode Atrial Fibrillation w/ RVR post-op day 3 the right colectomy surgery on 11-08-2013       Pneumonia    PONV (postoperative nausea and vomiting)    Pulmonary nodule, right    right upper lobe per last Chest CT 05-14-2015    ALLERGIES:  is allergic  to oxycontin [oxycodone], sulfa antibiotics, and morphine and codeine.  MEDICATIONS:  Current Outpatient Medications  Medication Sig Dispense Refill   albuterol (VENTOLIN HFA) 108 (90 Base) MCG/ACT inhaler Inhale 2 puffs into the lungs every 6 (six) hours as needed for wheezing or shortness of breath.     alendronate (FOSAMAX) 70 MG tablet Take 70 mg by mouth See admin instructions. 70 mg once a week on Thursday     ALPRAZolam (XANAX) 1 MG tablet Take 1 mg by mouth 3 (three) times daily as needed for anxiety.     ASCORBIC ACID PO Take 1 tablet by mouth daily. Vitamin C, unknown strength.     aspirin EC 81 MG tablet Take 81 mg by mouth daily.     atorvastatin (LIPITOR) 20 MG tablet Take 20 mg by mouth  at bedtime.      buPROPion (WELLBUTRIN SR) 150 MG 12 hr tablet Take 150 mg by mouth daily.     carvedilol (COREG) 3.125 MG tablet Take 3.125 mg by mouth every morning.     Cholecalciferol (VITAMIN D-3 PO) Take 1 capsule by mouth daily.     Cyanocobalamin (VITAMIN B-12 PO) Take 1 tablet by mouth daily.     desvenlafaxine (PRISTIQ) 50 MG 24 hr tablet Take 50 mg by mouth daily.     dexamethasone (DECADRON) 4 MG tablet Please take 1 tablet twice a day the day before, the day of, and the day after chemotherapy 40 tablet 2   dexamethasone (DECADRON) 4 MG tablet Take 1 tab 2 times daily starting day before pemetrexed. Then take 2 tabs daily x 3 days starting day after cisplatin. Take with food. 30 tablet 1   diltiazem (CARDIZEM CD) 120 MG 24 hr capsule TAKE 1 CAPSULE (120 MG TOTAL) BY MOUTH DAILY. 30 capsule 7   donepezil (ARICEPT) 5 MG tablet Take 5 mg by mouth at bedtime.     escitalopram (LEXAPRO) 20 MG tablet Take 20 mg by mouth every evening.   3   feeding supplement (ENSURE ENLIVE / ENSURE PLUS) LIQD Take 237 mLs by mouth 3 (three) times daily between meals. 237 mL 12   folic acid (FOLVITE) 1 MG tablet Take 1 tablet (1 mg total) by mouth daily. Start 7 days before pemetrexed chemotherapy. Continue until 21 days after pemetrexed completed. 100 tablet 3   HYDROcodone-acetaminophen (NORCO) 7.5-325 MG tablet Take 1 tablet by mouth every 4 (four) hours as needed for severe pain. 15 tablet 0   levothyroxine (SYNTHROID) 88 MCG tablet Take 88 mcg by mouth every morning.     pantoprazole (PROTONIX) 40 MG tablet Take 40 mg by mouth daily.     potassium chloride SA (KLOR-CON M) 20 MEQ tablet Take 1 tablet (20 mEq total) by mouth daily. 7 tablet 0   pregabalin (LYRICA) 25 MG capsule Take 1 capsule (25 mg total) by mouth 2 (two) times daily. 60 capsule 1   prochlorperazine (COMPAZINE) 10 MG tablet Take 1 tablet (10 mg total) by mouth every 6 (six) hours as needed for nausea or vomiting. 30 tablet 1   SYMBICORT  160-4.5 MCG/ACT inhaler Inhale 1 puff into the lungs 2 (two) times daily.     Tiotropium Bromide-Olodaterol (STIOLTO RESPIMAT) 2.5-2.5 MCG/ACT AERS Inhale 2 puffs into the lungs daily. 4 g 5   zolpidem (AMBIEN) 10 MG tablet Take 10 mg by mouth at bedtime.      No current facility-administered medications for this visit.    SURGICAL HISTORY:  Past Surgical History:  Procedure Laterality Date   APPENDECTOMY  1978   CERVICAL FUSION  1997   C5 -- C7   COLONOSCOPY  last one 10-17-2014   ESOPHAGOGASTRODUODENOSCOPY  05/21/2011   Procedure: ESOPHAGOGASTRODUODENOSCOPY (EGD);  Surgeon: Theda Belfast, MD;  Location: [facility];  Service: Endoscopy;  Laterality: N/A;   EXAMINATION UNDER ANESTHESIA N/A 11/08/2013   Procedure: ANAL EXAM UNDER ANESTHESIA WITH BIOPSY;  Surgeon: Romie Levee, MD;  Location: [facility];  Service: General;  Laterality: N/A;   INTERCOSTAL NERVE BLOCK Right 03/01/2022   Procedure: INTERCOSTAL NERVE BLOCK;  Surgeon: Loreli Slot, MD;  Location: [facility] OR;  Service: Thoracic;  Laterality: Right;   LAPAROSCOPIC PARTIAL COLECTOMY N/A 11/08/2013   Procedure: laparoscopic partial right colectomy;  Surgeon: Romie Levee, MD;  Location: [facility];  Service: General;  Laterality: N/A;   MASS EXCISION  06/02/2011   Procedure: EXCISION MASS;  Surgeon: Delight Ovens, MD;  Location: [facility] OR;  Service: Thoracic;;  minithoracotomy resection spindle cell tumor right chest   MASS EXCISION N/A 05/06/2016   Procedure: EXCISION ANAL CONDYLOMA;  Surgeon: Romie Levee, MD;  Location: [facility];  Service: General;  Laterality: N/A;   NODE DISSECTION Right 03/01/2022   Procedure: NODE DISSECTION;  Surgeon: Loreli Slot, MD;  Location: [facility] OR;  Service: Thoracic;  Laterality: Right;   THORACIC DISCECTOMY Right 01/13/2016   Procedure: RIGHT THORACIC ONE THORACIC TWO THORACIC LAMINOTOMY AND MICRODISCECTOMY;  Surgeon: Shirlean Kelly, MD;  Location: [facility];  Service:  Neurosurgery;  Laterality: Right;   TONSILLECTOMY  1971   TRANSTHORACIC ECHOCARDIOGRAM  08/28/2014   ef 55-60%/  trivial MR and TR   VIDEO BRONCHOSCOPY  05/11/2011   Procedure: VIDEO BRONCHOSCOPY WITH FLUORO;  Surgeon: [name], MD;  Location: [facility];  Service: Cardiopulmonary;  Laterality: Bilateral;   VIDEO BRONCHOSCOPY  06/02/2011   Procedure: VIDEO BRONCHOSCOPY;  Surgeon: Delight Ovens, MD;  Location: [facility] OR;  Service: Thoracic;  Laterality: N/A;    REVIEW OF SYSTEMS:   Review of Systems  Constitutional: Negative for appetite change, chills, fatigue, fever and unexpected weight change.  HENT: Negative for mouth sores, nosebleeds, sore throat and trouble swallowing.   Eyes: Negative for eye problems and icterus.  Respiratory: Positive for mild dyspnea on exertion.  Negative for cough, hemoptysis, and wheezing.   Cardiovascular: Negative for chest pain and leg swelling.  Gastrointestinal: Negative for abdominal pain, constipation, diarrhea, nausea and vomiting.  Genitourinary: Negative for bladder incontinence, difficulty urinating, dysuria, frequency and hematuria.   Musculoskeletal: Negative for back pain, gait problem, neck pain and neck stiffness.  Skin: Negative for itching and rash.  Neurological: Negative for dizziness, extremity weakness, gait problem, headaches, light-headedness and seizures.  Hematological: Negative for adenopathy. Does not bruise/bleed easily.  Psychiatric/Behavioral: Depression occasionally (none at this time). Negative for confusion and sleep disturbance. The patient is not nervous/anxious.     PHYSICAL EXAMINATION:  Blood pressure (!) 158/78, pulse 99, temperature 97.7 F (36.5 C), temperature source Oral, resp. rate 17, weight 144 lb 11.2 oz (65.6 kg), SpO2 98%.  ECOG PERFORMANCE STATUS: 1  Physical Exam  Constitutional: Oriented to person, place, and time and well-developed, well-nourished, and in no distress.  HENT:  Head:  Normocephalic and atraumatic.  Mouth/Throat: Oropharynx is clear and moist. No oropharyngeal exudate.  Eyes: Conjunctivae are normal. Right eye exhibits no discharge. Left eye exhibits no discharge. No scleral icterus.  Neck: Normal range of motion. Neck supple.  Cardiovascular: Normal rate, regular  rhythm, normal heart sounds and intact distal pulses.   Pulmonary/Chest: Effort normal and breath sounds normal. No respiratory distress. No wheezes. No rales.  Abdominal: Soft. Bowel sounds are normal. Exhibits no distension and no mass. There is no tenderness.  Musculoskeletal: Normal range of motion. Exhibits no edema.  Lymphadenopathy:    No cervical adenopathy.  Neurological: Alert and oriented to person, place, and time. Exhibits muscle wasting. Gait normal. Coordination normal.  Skin: Skin is warm and dry. No rash noted. Not diaphoretic. No erythema. No pallor.  Psychiatric: Mood, memory and judgment normal.  Vitals reviewed.  LABORATORY DATA: Lab Results  Component Value Date   WBC 7.0 01/03/2023   HGB 11.4 (L) 01/03/2023   HCT 35.5 (L) 01/03/2023   MCV 86.8 01/03/2023   PLT 397 01/03/2023      Chemistry      Component Value Date/Time   NA 141 01/03/2023 1443   K 3.9 01/03/2023 1443   CL 106 01/03/2023 1443   CO2 30 01/03/2023 1443   BUN 13 01/03/2023 1443   CREATININE 1.07 (H) 01/03/2023 1443   CREATININE 0.71 04/23/2013 1443      Component Value Date/Time   CALCIUM 9.1 01/03/2023 1443   ALKPHOS 50 01/03/2023 1443   AST 25 01/03/2023 1443   ALT 22 01/03/2023 1443   BILITOT 0.4 01/03/2023 1443       RADIOGRAPHIC STUDIES:  CT Chest W Contrast  Result Date: 01/31/2023 CLINICAL DATA:  Non-small-cell lung cancer. Restaging. * Tracking Code: BO * EXAM: CT CHEST WITH CONTRAST TECHNIQUE: Multidetector CT imaging of the chest was performed during intravenous contrast administration. RADIATION DOSE REDUCTION: This exam was performed according to the departmental  dose-optimization program which includes automated exposure control, adjustment of the mA and/or kV according to patient size and/or use of iterative reconstruction technique. CONTRAST:  75mL OMNIPAQUE IOHEXOL 300 MG/ML  SOLN COMPARISON:  09/01/2022 FINDINGS: Cardiovascular: The heart size is normal. No substantial pericardial effusion. Coronary artery calcification is evident. Mild atherosclerotic calcification is noted in the wall of the thoracic aorta. Mediastinum/Nodes: No mediastinal lymphadenopathy. Upper normal mediastinal lymph nodes appear mildly progressive. Index 8 mm right paratracheal node on 55/2 was 3 mm previously. There is no hilar lymphadenopathy. The esophagus has normal imaging features. Tiny hiatal hernia. There is no axillary lymphadenopathy. Lungs/Pleura: Centrilobular and paraseptal emphysema evident. Stable appearance of surgical scarring in the medial right lung and volume loss in the right hemithorax. No new suspicious pulmonary nodule or mass. No focal airspace consolidation. Trace right pleural effusion is new in the interval. Upper Abdomen: Focal low-density in the medial segment left liver along the falciform ligament is more conspicuous today but probably represents fatty deposition. Stable 8 mm subcapsular low-density lesion inferior right liver on 159/2, likely benign. Stable 3.9 cm left adrenal mass. Musculoskeletal: No worrisome lytic or sclerotic osseous abnormality. IMPRESSION: 1. Stable appearance of surgical scarring in the medial right lung with volume loss in the right hemithorax. No new or progressive findings to suggest recurrent or metastatic disease. 2. Upper normal mediastinal lymph nodes appear mildly progressive. Close attention on follow-up recommended. 3. Stable 3.9 cm left adrenal mass. This was characterized as benign adenoma on PET-CT of 12/07/2021. 4. Aortic Atherosclerosis (ICD10-I70.0) and Emphysema (ICD10-J43.9). Electronically Signed   By: Kennith Center M.D.    On: 01/31/2023 08:43     ASSESSMENT/PLAN:  This is a very pleasant 72 year old Caucasian female with stage IIb (T1a, N1, M0) non-small cell lung cancer, adenocarcinoma.  The  patient presented with a right upper lobe lung nodule.  The patient was diagnosed in November 2023.  She is positive for K-ras G 12C mutation and her PD-L1 expression is 1%.  She is status post right upper lobectomy with lymph node dissection under the care of Dr. Dorris Fetch on 03/01/2022.  She then completed 4 cycles of adjuvant chemotherapy with cisplatin 75 mg/m and Alimta 500 mg/m IV every 3 weeks.  She is currently on observation and feeling fine.  She recently had a restaging CT scan performed.  The patient was seen with Dr. Arbutus Ped today.  Dr. Arbutus Ped personally and independently reviewed the scan and discussed the results with the patient today.  The scan showed no evidence of disease progression.  There is a mildly progressive mediastinal lymph node that warrants close monitoring.  Recommend that she continue on observation with restaging CT scan the chest in 3 months  We will see her back for a follow-up visit 7 to 10 days after her CT scan to review the results in the office.  She is looking forward to seeing a psychiatrist next month for her depression/lack of interest.  The patient was advised to call immediately if she has any concerning symptoms in the interval. The patient voices understanding of current disease status and treatment options and is in agreement with the current care plan. All questions were answered. The patient knows to call the clinic with any problems, questions or concerns. We can certainly see the patient much sooner if necessary   Orders Placed This Encounter  Procedures   CT Chest W Contrast    Standing Status:   Future    Standing Expiration Date:   01/31/2024    Order Specific Question:   If indicated for the ordered procedure, I authorize the administration of contrast  media per Radiology protocol    Answer:   Yes    Order Specific Question:   Does the patient have a contrast media/X-ray dye allergy?    Answer:   No    Order Specific Question:   Preferred imaging location?    Answer:   [facility]   CBC with Differential (Cancer Center Only)    Standing Status:   Future    Standing Expiration Date:   01/31/2024   CMP (Cancer Center only)    Standing Status:   Future    Standing Expiration Date:   01/31/2024     Andrea Abraham Kaylenn Civil, PA-C 01/31/23  ADDENDUM: Hematology/Oncology Attending: I do face-to-face encounter with the patient today.  I reviewed her records, lab, scan and recommended her care plan.  This is a very pleasant 72 years old white female with stage IIb non-small cell lung cancer, adenocarcinoma diagnosed in November 2023 with positive KRAS G12C mutation and PD-L1 expression of 1%.  The patient status post right upper lobectomy with lymph node dissection followed by 4 cycles of adjuvant systemic chemotherapy with cisplatin and Alimta.  She is currently on observation and feeling fine with no concerning complaints.  She is scheduled to see psychiatry for evaluation of her depression. The patient had repeat CT scan of the chest performed recently.  I personally and independently reviewed the scan and discussed the result with the patient today. Her scan showed no concerning findings for disease recurrence or metastasis but there was enlargement of upper normal mediastinal lymph node.  I recommended for the patient to have repeat CT scan of the chest in 3 months for further evaluation of this  mediastinal lymph node and to rule out any disease recurrence. She was advised to call immediately if she has any other concerning symptoms in the interval. The total time spent in the appointment was 20 minutes. Disclaimer: This note was dictated with voice recognition software. Similar sounding words can inadvertently be transcribed and may  be missed upon review. Lajuana Matte, MD

## 2023-01-28 ENCOUNTER — Telehealth: Payer: Self-pay | Admitting: Medical Oncology

## 2023-01-28 NOTE — Telephone Encounter (Signed)
Confirmed appt from Monday.

## 2023-01-31 ENCOUNTER — Inpatient Hospital Stay: Payer: 59 | Attending: Internal Medicine | Admitting: Physician Assistant

## 2023-01-31 VITALS — BP 158/78 | HR 99 | Temp 97.7°F | Resp 17 | Wt 144.7 lb

## 2023-01-31 DIAGNOSIS — C3411 Malignant neoplasm of upper lobe, right bronchus or lung: Secondary | ICD-10-CM | POA: Diagnosis present

## 2023-01-31 DIAGNOSIS — R599 Enlarged lymph nodes, unspecified: Secondary | ICD-10-CM | POA: Insufficient documentation

## 2023-01-31 DIAGNOSIS — C3491 Malignant neoplasm of unspecified part of right bronchus or lung: Secondary | ICD-10-CM | POA: Diagnosis not present

## 2023-01-31 DIAGNOSIS — Z902 Acquired absence of lung [part of]: Secondary | ICD-10-CM | POA: Diagnosis not present

## 2023-01-31 DIAGNOSIS — F32A Depression, unspecified: Secondary | ICD-10-CM | POA: Diagnosis not present

## 2023-01-31 DIAGNOSIS — Z87891 Personal history of nicotine dependence: Secondary | ICD-10-CM | POA: Insufficient documentation

## 2023-01-31 DIAGNOSIS — Z79899 Other long term (current) drug therapy: Secondary | ICD-10-CM | POA: Diagnosis not present

## 2023-02-01 ENCOUNTER — Other Ambulatory Visit: Payer: Self-pay

## 2023-02-04 ENCOUNTER — Encounter: Payer: Self-pay | Admitting: Internal Medicine

## 2023-02-11 ENCOUNTER — Telehealth: Payer: Self-pay | Admitting: *Deleted

## 2023-02-14 ENCOUNTER — Telehealth: Payer: Self-pay | Admitting: *Deleted

## 2023-02-14 NOTE — Telephone Encounter (Signed)
[name] she is to be done at the hospital.

## 2023-02-14 NOTE — Telephone Encounter (Signed)
Issue has been addressed by Cathlyn Parsons CRNA

## 2023-02-14 NOTE — Telephone Encounter (Signed)
Open TE on this pt currently

## 2023-02-14 NOTE — Telephone Encounter (Signed)
Dr. Rhea Belton,  Patient scheduled for recall colon. Hx of difficult intubation. OV or direct to the hospital? Please advise.  Thank you, PV

## 2023-02-14 NOTE — Telephone Encounter (Signed)
Yesi,   This pt is a documented difficult intubation and her procedure will need to be done at the hospital.   Thanks,  Olawale Marney 

## 2023-02-14 NOTE — Telephone Encounter (Signed)
We need to make sure her [facility] procedure(s) are converted to the outpatient hospital setting Thanks JMP

## 2023-02-15 ENCOUNTER — Other Ambulatory Visit: Payer: Self-pay

## 2023-02-15 ENCOUNTER — Telehealth: Payer: Self-pay | Admitting: *Deleted

## 2023-02-15 DIAGNOSIS — Z860100 Personal history of colon polyps, unspecified: Secondary | ICD-10-CM

## 2023-02-15 NOTE — Telephone Encounter (Signed)
Pt informed of change in location for procedure  and times to arrive. Informed her of time and date of PV.

## 2023-02-15 NOTE — Telephone Encounter (Signed)
Thank you Linda!

## 2023-02-15 NOTE — Telephone Encounter (Signed)
Attempt to reach pt to inform her of new date and location of procedure. VM full and no other #'s in profile to call.

## 2023-02-15 NOTE — Telephone Encounter (Signed)
Pts colon scheduled at [facility] 05/12/23 at 7:30am. [id number]. Please notify pt of new date and location, she has upcoming previsit.

## 2023-02-15 NOTE — Telephone Encounter (Signed)
Attempt made to make pt aware of new date time and location of procedure. VM full and no other # in profile to attempt.

## 2023-02-16 ENCOUNTER — Encounter: Payer: Self-pay | Admitting: Internal Medicine

## 2023-02-17 ENCOUNTER — Other Ambulatory Visit: Payer: Self-pay

## 2023-02-18 ENCOUNTER — Telehealth: Payer: Self-pay | Admitting: *Deleted

## 2023-02-18 ENCOUNTER — Ambulatory Visit: Payer: 59

## 2023-02-18 NOTE — Telephone Encounter (Signed)
Calle to do PV. Pt very easy to confused but did verify with two correct id's  Pt stated she had no one who could take her to the hospital for her procedure. S She expressed that she is having a hard time with things mentally since her fall earlier in the year. She has decided to cancel procedure and PV and will call and get an OV with MD next year. Gave office # and MD name for her to call when ready.

## 2023-02-18 NOTE — Telephone Encounter (Signed)
Noted  

## 2023-02-18 NOTE — Progress Notes (Unsigned)
Pt's name and DOB verified at the beginning of the pre-visit wit 2 identifiers  Pt denies any difficulty with ambulating,sitting, laying down or rolling side to side  Gave both [facility] main # and MD on call # prior to instructions.   No egg or soy allergy known to patient   No issues known to pt with past sedation with any surgeries or procedures  Pt denies having issues being intubated  Patient denies ever being intubated  Pt has no issues moving head neck or swallowing  No FH of Malignant Hyperthermia  Pt is not on diet pills or shots  Pt is not on home 02   Pt is not on blood thinners   Pt denies issues with constipation   Pt has frequent issues with constipation RN instructed pt to use Miralax per bottles instructions a week before prep days. Pt states they will  Pt is not on dialysis  Pt denise any abnormal heart rhythms   Pt denies any upcoming cardiac testing  Pt encouraged to use to use Singlecare or Goodrx to reduce cost   Patient's chart reviewed by Cathlyn Parsons CNRA prior to pre-visit and patient appropriate for the [facility].  Pre-visit completed and red dot placed by patient's name on their procedure day (on provider's schedule).  .  Visit by phone  Pt states weight is   Instructed pt why it is important to and  to call if they have any changes in health or new medications. Directed them to the # given and on instructions.     Instructions reviewed with pt and pt states understanding. Instructed to review again prior to procedure. Pt states they will.   Instructions sent by mail with coupon and by my chart

## 2023-03-11 ENCOUNTER — Encounter: Payer: 59 | Admitting: Internal Medicine

## 2023-03-20 ENCOUNTER — Encounter (HOSPITAL_COMMUNITY): Payer: Self-pay

## 2023-03-20 ENCOUNTER — Emergency Department (HOSPITAL_COMMUNITY): Payer: 59

## 2023-03-20 ENCOUNTER — Inpatient Hospital Stay (HOSPITAL_COMMUNITY)
Admission: EM | Admit: 2023-03-20 | Discharge: 2023-03-28 | DRG: 871 | Disposition: A | Discharge status: Skilled Nursing Facility | Payer: 59 | Attending: Internal Medicine | Admitting: Internal Medicine

## 2023-03-20 DIAGNOSIS — Z8041 Family history of malignant neoplasm of ovary: Secondary | ICD-10-CM

## 2023-03-20 DIAGNOSIS — Z9049 Acquired absence of other specified parts of digestive tract: Secondary | ICD-10-CM

## 2023-03-20 DIAGNOSIS — E785 Hyperlipidemia, unspecified: Secondary | ICD-10-CM | POA: Diagnosis present

## 2023-03-20 DIAGNOSIS — I480 Paroxysmal atrial fibrillation: Secondary | ICD-10-CM | POA: Diagnosis present

## 2023-03-20 DIAGNOSIS — E876 Hypokalemia: Secondary | ICD-10-CM | POA: Diagnosis present

## 2023-03-20 DIAGNOSIS — C3491 Malignant neoplasm of unspecified part of right bronchus or lung: Secondary | ICD-10-CM | POA: Diagnosis not present

## 2023-03-20 DIAGNOSIS — I10 Essential (primary) hypertension: Secondary | ICD-10-CM | POA: Diagnosis present

## 2023-03-20 DIAGNOSIS — Z825 Family history of asthma and other chronic lower respiratory diseases: Secondary | ICD-10-CM

## 2023-03-20 DIAGNOSIS — N3080 Other cystitis without hematuria: Secondary | ICD-10-CM | POA: Diagnosis present

## 2023-03-20 DIAGNOSIS — Z87891 Personal history of nicotine dependence: Secondary | ICD-10-CM | POA: Diagnosis not present

## 2023-03-20 DIAGNOSIS — N179 Acute kidney failure, unspecified: Secondary | ICD-10-CM | POA: Diagnosis present

## 2023-03-20 DIAGNOSIS — G9341 Metabolic encephalopathy: Secondary | ICD-10-CM | POA: Diagnosis not present

## 2023-03-20 DIAGNOSIS — Z8051 Family history of malignant neoplasm of kidney: Secondary | ICD-10-CM

## 2023-03-20 DIAGNOSIS — Z801 Family history of malignant neoplasm of trachea, bronchus and lung: Secondary | ICD-10-CM

## 2023-03-20 DIAGNOSIS — J449 Chronic obstructive pulmonary disease, unspecified: Secondary | ICD-10-CM | POA: Diagnosis not present

## 2023-03-20 DIAGNOSIS — E871 Hypo-osmolality and hyponatremia: Secondary | ICD-10-CM | POA: Diagnosis present

## 2023-03-20 DIAGNOSIS — Z79890 Hormone replacement therapy (postmenopausal): Secondary | ICD-10-CM

## 2023-03-20 DIAGNOSIS — J432 Centrilobular emphysema: Secondary | ICD-10-CM | POA: Diagnosis present

## 2023-03-20 DIAGNOSIS — F05 Delirium due to known physiological condition: Secondary | ICD-10-CM | POA: Diagnosis not present

## 2023-03-20 DIAGNOSIS — Z885 Allergy status to narcotic agent status: Secondary | ICD-10-CM

## 2023-03-20 DIAGNOSIS — Z860101 Personal history of adenomatous and serrated colon polyps: Secondary | ICD-10-CM

## 2023-03-20 DIAGNOSIS — Z882 Allergy status to sulfonamides status: Secondary | ICD-10-CM

## 2023-03-20 DIAGNOSIS — R410 Disorientation, unspecified: Secondary | ICD-10-CM

## 2023-03-20 DIAGNOSIS — Z902 Acquired absence of lung [part of]: Secondary | ICD-10-CM | POA: Diagnosis not present

## 2023-03-20 DIAGNOSIS — Z8249 Family history of ischemic heart disease and other diseases of the circulatory system: Secondary | ICD-10-CM

## 2023-03-20 DIAGNOSIS — E039 Hypothyroidism, unspecified: Secondary | ICD-10-CM | POA: Insufficient documentation

## 2023-03-20 DIAGNOSIS — Z7983 Long term (current) use of bisphosphonates: Secondary | ICD-10-CM

## 2023-03-20 DIAGNOSIS — E860 Dehydration: Secondary | ICD-10-CM | POA: Diagnosis present

## 2023-03-20 DIAGNOSIS — F32A Depression, unspecified: Secondary | ICD-10-CM | POA: Diagnosis present

## 2023-03-20 DIAGNOSIS — G4700 Insomnia, unspecified: Secondary | ICD-10-CM | POA: Diagnosis present

## 2023-03-20 DIAGNOSIS — N390 Urinary tract infection, site not specified: Secondary | ICD-10-CM | POA: Diagnosis present

## 2023-03-20 DIAGNOSIS — A419 Sepsis, unspecified organism: Secondary | ICD-10-CM | POA: Diagnosis not present

## 2023-03-20 DIAGNOSIS — R6520 Severe sepsis without septic shock: Secondary | ICD-10-CM | POA: Diagnosis present

## 2023-03-20 DIAGNOSIS — Z7951 Long term (current) use of inhaled steroids: Secondary | ICD-10-CM

## 2023-03-20 DIAGNOSIS — Z981 Arthrodesis status: Secondary | ICD-10-CM

## 2023-03-20 DIAGNOSIS — Z79899 Other long term (current) drug therapy: Secondary | ICD-10-CM

## 2023-03-20 DIAGNOSIS — Z1231 Encounter for screening mammogram for malignant neoplasm of breast: Secondary | ICD-10-CM

## 2023-03-20 DIAGNOSIS — Z803 Family history of malignant neoplasm of breast: Secondary | ICD-10-CM

## 2023-03-20 DIAGNOSIS — F419 Anxiety disorder, unspecified: Secondary | ICD-10-CM | POA: Diagnosis present

## 2023-03-20 DIAGNOSIS — Z87442 Personal history of urinary calculi: Secondary | ICD-10-CM

## 2023-03-20 DIAGNOSIS — J440 Chronic obstructive pulmonary disease with acute lower respiratory infection: Secondary | ICD-10-CM | POA: Diagnosis present

## 2023-03-20 DIAGNOSIS — R319 Hematuria, unspecified: Secondary | ICD-10-CM

## 2023-03-20 DIAGNOSIS — Z85118 Personal history of other malignant neoplasm of bronchus and lung: Secondary | ICD-10-CM

## 2023-03-20 DIAGNOSIS — J189 Pneumonia, unspecified organism: Secondary | ICD-10-CM | POA: Diagnosis present

## 2023-03-20 DIAGNOSIS — Z7982 Long term (current) use of aspirin: Secondary | ICD-10-CM

## 2023-03-20 DIAGNOSIS — M26642 Arthritis of left temporomandibular joint: Secondary | ICD-10-CM | POA: Diagnosis present

## 2023-03-20 DIAGNOSIS — Z800 Family history of malignant neoplasm of digestive organs: Secondary | ICD-10-CM

## 2023-03-20 LAB — COMPREHENSIVE METABOLIC PANEL
ALT: 24 U/L (ref 0–44)
AST: 26 U/L (ref 15–41)
Albumin: 3.2 g/dL — ABNORMAL LOW (ref 3.5–5.0)
Alkaline Phosphatase: 59 U/L (ref 38–126)
Anion gap: 20 — ABNORMAL HIGH (ref 5–15)
BUN: 68 mg/dL — ABNORMAL HIGH (ref 8–23)
CO2: 29 mmol/L (ref 22–32)
Calcium: 10.3 mg/dL (ref 8.9–10.3)
Chloride: 91 mmol/L — ABNORMAL LOW (ref 98–111)
Creatinine, Ser: 1.93 mg/dL — ABNORMAL HIGH (ref 0.44–1.00)
GFR, Estimated: 27 mL/min — ABNORMAL LOW (ref >60)
Glucose, Bld: 128 mg/dL — ABNORMAL HIGH (ref 70–99)
Potassium: 3 mmol/L — ABNORMAL LOW (ref 3.5–5.1)
Sodium: 140 mmol/L (ref 135–145)
Total Bilirubin: 1.1 mg/dL (ref <1.2)
Total Protein: 6.3 g/dL — ABNORMAL LOW (ref 6.5–8.1)

## 2023-03-20 LAB — APTT: aPTT: <22 s — ABNORMAL LOW (ref 24–36)

## 2023-03-20 LAB — SARS CORONAVIRUS 2 BY RT PCR: SARS Coronavirus 2 by RT PCR: NEGATIVE

## 2023-03-20 LAB — PROTIME-INR
INR: 1 (ref 0.8–1.2)
Prothrombin Time: 13.4 s (ref 11.4–15.2)

## 2023-03-20 LAB — LACTIC ACID, PLASMA: Lactic Acid, Venous: 1.1 mmol/L (ref 0.5–1.9)

## 2023-03-20 LAB — URINALYSIS, W/ REFLEX TO CULTURE (INFECTION SUSPECTED)
Bilirubin Urine: NEGATIVE
Glucose, UA: NEGATIVE mg/dL
Ketones, ur: 5 mg/dL — AB
Nitrite: POSITIVE — AB
Protein, ur: NEGATIVE mg/dL
Specific Gravity, Urine: 1.018 (ref 1.005–1.030)
WBC, UA: >50 WBC/hpf (ref 0–5)
pH: 5 (ref 5.0–8.0)

## 2023-03-20 LAB — CBC WITH DIFFERENTIAL/PLATELET
Abs Immature Granulocytes: 0.52 10*3/uL — ABNORMAL HIGH (ref 0.00–0.07)
Basophils Absolute: 0.1 10*3/uL (ref 0.0–0.1)
Basophils Relative: 0 %
Eosinophils Absolute: 0 10*3/uL (ref 0.0–0.5)
Eosinophils Relative: 0 %
HCT: 42.2 % (ref 36.0–46.0)
Hemoglobin: 14 g/dL (ref 12.0–15.0)
Immature Granulocytes: 3 %
Lymphocytes Relative: 8 %
Lymphs Abs: 1.5 10*3/uL (ref 0.7–4.0)
MCH: 26 pg (ref 26.0–34.0)
MCHC: 33.2 g/dL (ref 30.0–36.0)
MCV: 78.3 fL — ABNORMAL LOW (ref 80.0–100.0)
Monocytes Absolute: 1.6 10*3/uL — ABNORMAL HIGH (ref 0.1–1.0)
Monocytes Relative: 8 %
Neutro Abs: 15.9 10*3/uL — ABNORMAL HIGH (ref 1.7–7.7)
Neutrophils Relative %: 81 %
Platelets: 360 10*3/uL (ref 150–400)
RBC: 5.39 MIL/uL — ABNORMAL HIGH (ref 3.87–5.11)
RDW: 17.5 % — ABNORMAL HIGH (ref 11.5–15.5)
WBC: 19.6 10*3/uL — ABNORMAL HIGH (ref 4.0–10.5)
nRBC: 1.2 % — ABNORMAL HIGH (ref 0.0–0.2)

## 2023-03-20 LAB — I-STAT CHEM 8, ED
BUN: 58 mg/dL — ABNORMAL HIGH (ref 8–23)
Calcium, Ion: 1.11 mmol/L — ABNORMAL LOW (ref 1.15–1.40)
Chloride: 94 mmol/L — ABNORMAL LOW (ref 98–111)
Creatinine, Ser: 2 mg/dL — ABNORMAL HIGH (ref 0.44–1.00)
Glucose, Bld: 128 mg/dL — ABNORMAL HIGH (ref 70–99)
HCT: 43 % (ref 36.0–46.0)
Hemoglobin: 14.6 g/dL (ref 12.0–15.0)
Potassium: 2.9 mmol/L — ABNORMAL LOW (ref 3.5–5.1)
Sodium: 138 mmol/L (ref 135–145)
TCO2: 32 mmol/L (ref 22–32)

## 2023-03-20 LAB — I-STAT CG4 LACTIC ACID, ED
Lactic Acid, Venous: 3.7 mmol/L (ref 0.5–1.9)
Lactic Acid, Venous: 3.3 mmol/L (ref 0.5–1.9)

## 2023-03-20 LAB — HEMOGLOBIN A1C
Hgb A1c MFr Bld: 5.3 % (ref 4.8–5.6)
Mean Plasma Glucose: 105.41 mg/dL

## 2023-03-20 LAB — CBG MONITORING, ED: Glucose-Capillary: 130 mg/dL — ABNORMAL HIGH (ref 70–99)

## 2023-03-20 LAB — MAGNESIUM: Magnesium: 2.2 mg/dL (ref 1.7–2.4)

## 2023-03-20 LAB — CK: Total CK: 29 U/L — ABNORMAL LOW (ref 38–234)

## 2023-03-20 MED ORDER — VANCOMYCIN HCL 1.5 G IV SOLR
1500.0000 mg | Freq: Once | INTRAVENOUS | Status: AC
  Administered 2023-03-20: 1500 mg via INTRAVENOUS
  Filled 2023-03-20: qty 30

## 2023-03-20 MED ORDER — ACETAMINOPHEN 650 MG RE SUPP: 650.0000 mg | Freq: Four times a day (QID) | RECTAL | Status: DC | PRN

## 2023-03-20 MED ORDER — LACTATED RINGERS IV BOLUS
1000.0000 mL | Freq: Once | INTRAVENOUS | Status: AC
  Administered 2023-03-20: 1000 mL via INTRAVENOUS

## 2023-03-20 MED ORDER — ENOXAPARIN SODIUM 30 MG/0.3ML IJ SOSY
30.0000 mg | PREFILLED_SYRINGE | Freq: Every day | INTRAMUSCULAR | Status: DC
  Administered 2023-03-21: 30 mg via SUBCUTANEOUS
  Filled 2023-03-20: qty 0.3

## 2023-03-20 MED ORDER — ONDANSETRON HCL 4 MG PO TABS: 4.0000 mg | ORAL_TABLET | Freq: Four times a day (QID) | ORAL | Status: DC | PRN

## 2023-03-20 MED ORDER — ONDANSETRON HCL 4 MG/2ML IJ SOLN
4.0000 mg | Freq: Four times a day (QID) | INTRAMUSCULAR | Status: DC | PRN
  Administered 2023-03-21: 4 mg via INTRAVENOUS
  Filled 2023-03-20: qty 2

## 2023-03-20 MED ORDER — SODIUM CHLORIDE 0.9 % IV SOLN: 2.0000 g | INTRAVENOUS | Status: DC

## 2023-03-20 MED ORDER — VANCOMYCIN HCL IN DEXTROSE 1-5 GM/200ML-% IV SOLN
1000.0000 mg | Freq: Once | INTRAVENOUS | Status: DC
Start: 2023-03-20 — End: 2023-03-20

## 2023-03-20 MED ORDER — POTASSIUM CHLORIDE 10 MEQ/100ML IV SOLN
10.0000 meq | Freq: Once | INTRAVENOUS | Status: AC
  Administered 2023-03-20: 10 meq via INTRAVENOUS
  Filled 2023-03-20: qty 100

## 2023-03-20 MED ORDER — ACETAMINOPHEN 325 MG PO TABS
650.0000 mg | ORAL_TABLET | Freq: Four times a day (QID) | ORAL | Status: DC | PRN
  Administered 2023-03-22: 650 mg via ORAL
  Filled 2023-03-20 (×2): qty 2

## 2023-03-20 MED ORDER — SODIUM CHLORIDE 0.9 % IV SOLN
2.0000 g | INTRAVENOUS | Status: DC
  Administered 2023-03-21 – 2023-03-23 (×3): 2 g via INTRAVENOUS
  Filled 2023-03-20 (×3): qty 20

## 2023-03-20 MED ORDER — METRONIDAZOLE 500 MG/100ML IV SOLN
500.0000 mg | Freq: Once | INTRAVENOUS | Status: AC
  Administered 2023-03-20: 500 mg via INTRAVENOUS
  Filled 2023-03-20: qty 100

## 2023-03-20 MED ORDER — SODIUM CHLORIDE 0.9 % IV SOLN: 500.0000 mg | INTRAVENOUS | Status: DC

## 2023-03-20 MED ORDER — ONDANSETRON HCL 4 MG/2ML IJ SOLN
4.0000 mg | Freq: Once | INTRAMUSCULAR | Status: AC
Start: 2023-03-20 — End: 2023-03-20
  Administered 2023-03-20: 4 mg via INTRAVENOUS
  Filled 2023-03-20: qty 2

## 2023-03-20 MED ORDER — SODIUM CHLORIDE 0.9 % IV SOLN
2.0000 g | Freq: Once | INTRAVENOUS | Status: AC
  Administered 2023-03-20: 2 g via INTRAVENOUS
  Filled 2023-03-20: qty 12.5

## 2023-03-20 MED ORDER — SODIUM CHLORIDE 0.9 % IV SOLN
500.0000 mg | Freq: Every day | INTRAVENOUS | Status: DC
Start: 2023-03-20 — End: 2023-03-21
  Administered 2023-03-21: 500 mg via INTRAVENOUS
  Filled 2023-03-20 (×2): qty 5

## 2023-03-20 MED ORDER — LACTATED RINGERS IV BOLUS (SEPSIS)
1000.0000 mL | Freq: Once | INTRAVENOUS | Status: AC
  Administered 2023-03-20: 1000 mL via INTRAVENOUS

## 2023-03-20 MED ORDER — LACTATED RINGERS IV SOLN: INTRAVENOUS | Status: DC

## 2023-03-20 NOTE — Progress Notes (Signed)
ED Pharmacy Antibiotic Sign Off An antibiotic consult was received from an ED provider for Cefepime and Vancomycin per pharmacy dosing for sepsis. A chart review was completed to assess appropriateness.   The following one time order(s) were placed:  Cefepime 2g IV x1 Vancomycin 1500mg  IV x1  Further antibiotic and/or antibiotic pharmacy consults should be ordered by the admitting provider if indicated.   Thank you for allowing pharmacy to be a part of this patient's care.   Wilburn Cornelia, PharmD, BCPS Clinical Pharmacist 03/20/2023 4:09 PM   Please refer to AMION for pharmacy phone number

## 2023-03-20 NOTE — ED Provider Notes (Signed)
[place] EMERGENCY DEPARTMENT AT [facility] Provider Note   CSN: [id number] Arrival date & time: 03/20/23  1423     History  Chief Complaint  Patient presents with   Fall   Weakness    Andrea Morgan is a 72 y.o. female with past medical history significant for anemia, hyperlipidemia, A-fib, COPD, hypothyroidism, lung cancer, GERD, hypertension presents to the ED via EMS from home.  EMS was called by the neighbors who had not seen or heard from her in 3 days.  Patient states that she fell yesterday and was unable to get up off of the floor.  Patient reports that she "does not feel well" but is unable to elaborate.  Patient appears confused, which is not her baseline.  EMS reports that she was tachycardic and route.       Home Medications Prior to Admission medications   Medication Sig Start Date End Date Taking? Authorizing Provider  albuterol (VENTOLIN HFA) 108 (90 Base) MCG/ACT inhaler Inhale 2 puffs into the lungs every 6 (six) hours as needed for wheezing or shortness of breath. 10/01/13   [provider]  alendronate (FOSAMAX) 70 MG tablet Take 70 mg by mouth See admin instructions. 70 mg once a week on Thursday    [provider]  ALPRAZolam Prudy Feeler) 1 MG tablet Take 1 mg by mouth 3 (three) times daily as needed for anxiety.    [provider]  ASCORBIC ACID PO Take 1 tablet by mouth daily. Vitamin C, unknown strength.    [provider]  aspirin EC 81 MG tablet Take 81 mg by mouth daily.    [provider]  atorvastatin (LIPITOR) 20 MG tablet Take 20 mg by mouth at bedtime.     [provider]  buPROPion (WELLBUTRIN SR) 150 MG 12 hr tablet Take 150 mg by mouth daily.    [provider]  carvedilol (COREG) 3.125 MG tablet Take 3.125 mg by mouth every morning.    [provider]  Cholecalciferol (VITAMIN D-3 PO) Take 1 capsule by mouth daily.    [provider]  Cyanocobalamin  (VITAMIN B-12 PO) Take 1 tablet by mouth daily.    [provider]  desvenlafaxine (PRISTIQ) 50 MG 24 hr tablet Take 50 mg by mouth daily.    [provider]  dexamethasone (DECADRON) 4 MG tablet Please take 1 tablet twice a day the day before, the day of, and the day after chemotherapy 04/29/22   Heilingoetter, Cassandra L, PA-C  dexamethasone (DECADRON) 4 MG tablet Take 1 tab 2 times daily starting day before pemetrexed. Then take 2 tabs daily x 3 days starting day after cisplatin. Take with food. 05/04/22   Si Gaul, MD  diltiazem (CARDIZEM CD) 120 MG 24 hr capsule TAKE 1 CAPSULE (120 MG TOTAL) BY MOUTH DAILY. 09/06/16   Rosalio Macadamia, NP  donepezil (ARICEPT) 5 MG tablet Take 5 mg by mouth at bedtime.    [provider]  escitalopram (LEXAPRO) 20 MG tablet Take 20 mg by mouth every evening.  12/11/15   [provider]  feeding supplement (ENSURE ENLIVE / ENSURE PLUS) LIQD Take 237 mLs by mouth 3 (three) times daily between meals. 03/08/22   Barrett, Erin R, PA-C  folic acid (FOLVITE) 1 MG tablet Take 1 tablet (1 mg total) by mouth daily. Start 7 days before pemetrexed chemotherapy. Continue until 21 days after pemetrexed completed. 05/04/22   Si Gaul, MD  HYDROcodone-acetaminophen [facility] 7.5-325 MG  tablet Take 1 tablet by mouth every 4 (four) hours as needed for severe pain. 03/30/22   Loreli Slot, MD  levothyroxine (SYNTHROID) 88 MCG tablet Take 88 mcg by mouth every morning. 08/18/20   [provider]  pantoprazole (PROTONIX) 40 MG tablet Take 40 mg by mouth daily.    [provider]  potassium chloride SA (KLOR-CON M) 20 MEQ tablet Take 1 tablet (20 mEq total) by mouth daily. 05/05/22   Heilingoetter, Cassandra L, PA-C  pregabalin (LYRICA) 25 MG capsule Take 1 capsule (25 mg total) by mouth 2 (two) times daily. 03/15/22   Loreli Slot, MD  prochlorperazine (COMPAZINE) 10 MG tablet Take 1 tablet (10 mg total) by  mouth every 6 (six) hours as needed for nausea or vomiting. 05/04/22   Si Gaul, MD  SYMBICORT 160-4.5 MCG/ACT inhaler Inhale 1 puff into the lungs 2 (two) times daily. 05/31/22   [provider]  Tiotropium Bromide-Olodaterol (STIOLTO RESPIMAT) 2.5-2.5 MCG/ACT AERS Inhale 2 puffs into the lungs daily. 03/30/22   [name], MD  zolpidem (AMBIEN) 10 MG tablet Take 10 mg by mouth at bedtime.     [provider]      Allergies    Oxycontin [oxycodone], Sulfa antibiotics, and Morphine and codeine    Review of Systems   Review of Systems  Unable to perform ROS: Mental status change  Neurological:  Positive for weakness.    Physical Exam Updated Vital Signs BP (!) 141/70   Pulse (!) 110   Temp 98 F (36.7 C) (Rectal)   Resp 15   Ht 5\' 6"  (1.676 m)   Wt 65 kg   SpO2 100%   BMI 23.13 kg/m  Physical Exam Vitals and nursing note reviewed.  Constitutional:      General: She is not in acute distress.    Appearance: Normal appearance. She is ill-appearing. She is not diaphoretic.  Cardiovascular:     Rate and Rhythm: Regular rhythm. Tachycardia present.  Pulmonary:     Effort: Pulmonary effort is normal. No tachypnea, accessory muscle usage or respiratory distress.     Breath sounds: Normal breath sounds and air entry.  Abdominal:     General: Abdomen is flat.     Palpations: Abdomen is soft.     Tenderness: There is generalized abdominal tenderness.  Skin:    General: Skin is warm and dry.     Capillary Refill: Capillary refill takes less than 2 seconds.  Neurological:     Mental Status: She is alert. She is confused.     GCS: GCS eye subscore is 4. GCS verbal subscore is 4. GCS motor subscore is 6.     Motor: Tremor present.     Comments: Gait deferred.  Psychiatric:        Mood and Affect: Mood normal.        Behavior: Behavior normal.     ED Results / Procedures / Treatments   Labs (all labs ordered are listed, but only abnormal results  are displayed) Labs Reviewed  CBC WITH DIFFERENTIAL/PLATELET - Abnormal; Notable for the following components:      Result Value   WBC 19.6 (*)    RBC 5.39 (*)    MCV 78.3 (*)    RDW 17.5 (*)    nRBC 1.2 (*)    Neutro Abs 15.9 (*)    Monocytes Absolute 1.6 (*)    Abs Immature Granulocytes 0.52 (*)    All other components within  normal limits  COMPREHENSIVE METABOLIC PANEL - Abnormal; Notable for the following components:   Potassium 3.0 (*)    Chloride 91 (*)    Glucose, Bld 128 (*)    BUN 68 (*)    Creatinine, Ser 1.93 (*)    Total Protein 6.3 (*)    Albumin 3.2 (*)    GFR, Estimated 27 (*)    Anion gap 20 (*)    All other components within normal limits  URINALYSIS, W/ REFLEX TO CULTURE (INFECTION SUSPECTED) - Abnormal; Notable for the following components:   APPearance HAZY (*)    Hgb urine dipstick SMALL (*)    Ketones, ur 5 (*)    Nitrite POSITIVE (*)    Leukocytes,Ua MODERATE (*)    Bacteria, UA MANY (*)    All other components within normal limits  CK - Abnormal; Notable for the following components:   Total CK 29 (*)    All other components within normal limits  APTT - Abnormal; Notable for the following components:   aPTT <22 (*)    All other components within normal limits  CBG MONITORING, ED - Abnormal; Notable for the following components:   Glucose-Capillary 130 (*)    All other components within normal limits  I-STAT CHEM 8, ED - Abnormal; Notable for the following components:   Potassium 2.9 (*)    Chloride 94 (*)    BUN 58 (*)    Creatinine, Ser 2.00 (*)    Glucose, Bld 128 (*)    Calcium, Ion 1.11 (*)    All other components within normal limits  I-STAT CG4 LACTIC ACID, ED - Abnormal; Notable for the following components:   Lactic Acid, Venous 3.7 (*)    All other components within normal limits  I-STAT CG4 LACTIC ACID, ED - Abnormal; Notable for the following components:   Lactic Acid, Venous 3.3 (*)    All other components within normal limits   CULTURE, BLOOD (ROUTINE X 2)  CULTURE, BLOOD (ROUTINE X 2)  URINE CULTURE  SARS CORONAVIRUS 2 BY RT PCR  PROTIME-INR  MAGNESIUM    EKG None  Radiology CT CHEST ABDOMEN PELVIS WO CONTRAST  Result Date: 03/20/2023 CLINICAL DATA:  Sepsis No contrast. PT BIB [facility] after called to home by neighbors who had not seen or heard from her x3 days. PT was found on her floor, conscious but confused. Hx of cancer. EXAM: CT CHEST, ABDOMEN AND PELVIS WITHOUT CONTRAST TECHNIQUE: Multidetector CT imaging of the chest, abdomen and pelvis was performed following the standard protocol without IV contrast. RADIATION DOSE REDUCTION: This exam was performed according to the departmental dose-optimization program which includes automated exposure control, adjustment of the mA and/or kV according to patient size and/or use of iterative reconstruction technique. COMPARISON:  CT chest 09/01/2022, CT abdomen pelvis 04/14/2011 FINDINGS: CHEST: Cardiovascular: The thoracic aorta is normal in caliber. The heart is normal in size. No significant pericardial effusion. Moderate atherosclerotic plaque. Lungs/Pleura: Status post right upper lobectomy. Centrilobular emphysematous changes. Bilateral patchy ground-glass airspace. No pulmonary nodule. No pulmonary mass. No pulmonary contusion or laceration. No pneumatocele formation. No pleural effusion. No pneumothorax. No hemothorax. Mediastinum/Nodes: No pneumomediastinum. The central airways are patent. The esophagus is unremarkable.  Small hiatal hernia. The thyroid is unremarkable. Limited evaluation for hilar lymphadenopathy on this noncontrast study. No mediastinal or axillary lymphadenopathy. Musculoskeletal/Chest wall No chest wall mass. No acute rib or sternal fracture. No acute spinal fracture. Similar-appearing concavity of the superior endplate of the T4 vertebral  body. ABDOMEN / PELVIS: Hepatobiliary: Not enlarged. Vague hypodensity along the falciform ligament likely focal  fatty infiltration. Subcentimeter hypodensities within the liver are chronic and likely representing simple hepatic cysts. The gallbladder is otherwise unremarkable with no radio-opaque gallstones. No biliary ductal dilatation. Pancreas: Normal pancreatic contour. No main pancreatic duct dilatation. Spleen: Not enlarged. No focal lesion. Adrenals/Urinary Tract: No nodularity bilaterally. No hydroureteronephrosis. No nephroureterolithiasis. No contour deforming renal mass. Air-fluid level within the urinary bladder lumen. Otherwise the urinary bladder is unremarkable. Stomach/Bowel: Right hemi colectomy. No small or large bowel wall thickening or dilatation. Colonic diverticulosis. The appendix is unremarkable. Vasculature/Lymphatic: Severe atherosclerotic plaque. No abdominal aorta or iliac aneurysm. No abdominal, pelvic, inguinal lymphadenopathy. Reproductive: Uterus and bilateral adnexal regions are unremarkable. Other: No simple free fluid ascites. No pneumoperitoneum. No mesenteric hematoma identified. No organized fluid collection. Musculoskeletal: No significant soft tissue hematoma. No acute pelvic fracture. No spinal fracture. Ports and Devices: None. IMPRESSION: 1. Bilateral patchy ground-glass airspace. Findings suggestive of infection/inflammation. 2. Air-fluid level within the urinary bladder lumen-correlate for recent instrumentation. If no such finding, recommend urinalysis for evaluation of possible infection. 3. No acute intrathoracic, intra-abdominal, intrapelvic traumatic injury with limited evaluation due to noncontrast study. 4. No acute fracture or traumatic malalignment of the thoracic or lumbar spine. Other imaging findings of potential clinical significance: 1. Aortic Atherosclerosis (ICD10-I70.0) and Emphysema (ICD10-J43.9). 2. Colonic diverticulosis with no acute diverticulitis. 3. Small hiatal hernia. 4. Status post right upper lobectomy and right hemicolectomy. Electronically Signed   By:  Tish Frederickson M.D.   On: 03/20/2023 17:20   CT Head Wo Contrast  Result Date: 03/20/2023 CLINICAL DATA:  Head trauma, minor (Age >= 65y); Neck trauma (Age >= 65y) home by neighbors who had not seen or heard from her x3 days. PT was found on her floor, conscious but confused. Hx of cancer. ST on monitor, Hr 130 en route, T 97.7 EXAM: CT HEAD WITHOUT CONTRAST CT CERVICAL SPINE WITHOUT CONTRAST TECHNIQUE: Multidetector CT imaging of the head and cervical spine was performed following the standard protocol without intravenous contrast. Multiplanar CT image reconstructions of the cervical spine were also generated. RADIATION DOSE REDUCTION: This exam was performed according to the departmental dose-optimization program which includes automated exposure control, adjustment of the mA and/or kV according to patient size and/or use of iterative reconstruction technique. COMPARISON:  CT head and C-spine 10/15/2022 FINDINGS: CT HEAD FINDINGS Brain: Patchy and confluent areas of decreased attenuation are noted throughout the deep and periventricular white matter of the cerebral hemispheres bilaterally, compatible with chronic microvascular ischemic disease. No evidence of large-territorial acute infarction. No parenchymal hemorrhage. No mass lesion. No extra-axial collection. No mass effect or midline shift. No hydrocephalus. Basilar cisterns are patent. Vascular: No hyperdense vessel. Atherosclerotic calcifications are present within the cavernous internal carotid arteries. Skull: No acute fracture or focal lesion. Sinuses/Orbits: Paranasal sinuses and mastoid air cells are clear. The orbits are unremarkable. Other: None. CT CERVICAL SPINE FINDINGS Alignment: Normal. Skull base and vertebrae: At least moderate degenerative change of the spine. Ligamentum flavum calcification. Osseous fusion of the C5 - C7 levels. Associated moderate severe osseous neural foraminal stenosis at the right C3-C4 levels. Associated severe  osseous neural foraminal stenosis at the left C3-C4 and C4-C5 levels. No severe osseous central canal stenosis. No acute fracture with slightly limited evaluation due to motion artifact. No aggressive appearing focal osseous lesion or focal pathologic process. Soft tissues and spinal canal: No prevertebral fluid or swelling. No visible canal hematoma.  Upper chest: Centrilobular emphysematous changes. Other: None. IMPRESSION: 1. No acute intracranial abnormality. 2. No acute displaced fracture or traumatic listhesis of the cervical spine. Slightly limited evaluation due to motion artifact. 3. 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 4.  Emphysema (ICD10-J43.9). Electronically Signed   By: Tish Frederickson M.D.   On: 03/20/2023 17:11   CT Cervical Spine Wo Contrast  Result Date: 03/20/2023 CLINICAL DATA:  Head trauma, minor (Age >= 65y); Neck trauma (Age >= 65y) home by neighbors who had not seen or heard from her x3 days. PT was found on her floor, conscious but confused. Hx of cancer. ST on monitor, Hr 130 en route, T 97.7 EXAM: CT HEAD WITHOUT CONTRAST CT CERVICAL SPINE WITHOUT CONTRAST TECHNIQUE: Multidetector CT imaging of the head and cervical spine was performed following the standard protocol without intravenous contrast. Multiplanar CT image reconstructions of the cervical spine were also generated. RADIATION DOSE REDUCTION: This exam was performed according to the departmental dose-optimization program which includes automated exposure control, adjustment of the mA and/or kV according to patient size and/or use of iterative reconstruction technique. COMPARISON:  CT head and C-spine 10/15/2022 FINDINGS: CT HEAD FINDINGS Brain: Patchy and confluent areas of decreased attenuation are noted throughout the deep and periventricular white matter of the cerebral hemispheres bilaterally, compatible with chronic microvascular ischemic disease. No evidence of large-territorial  acute infarction. No parenchymal hemorrhage. No mass lesion. No extra-axial collection. No mass effect or midline shift. No hydrocephalus. Basilar cisterns are patent. Vascular: No hyperdense vessel. Atherosclerotic calcifications are present within the cavernous internal carotid arteries. Skull: No acute fracture or focal lesion. Sinuses/Orbits: Paranasal sinuses and mastoid air cells are clear. The orbits are unremarkable. Other: None. CT CERVICAL SPINE FINDINGS Alignment: Normal. Skull base and vertebrae: At least moderate degenerative change of the spine. Ligamentum flavum calcification. Osseous fusion of the C5 - C7 levels. Associated moderate severe osseous neural foraminal stenosis at the right C3-C4 levels. Associated severe osseous neural foraminal stenosis at the left C3-C4 and C4-C5 levels. No severe osseous central canal stenosis. No acute fracture with slightly limited evaluation due to motion artifact. No aggressive appearing focal osseous lesion or focal pathologic process. Soft tissues and spinal canal: No prevertebral fluid or swelling. No visible canal hematoma. Upper chest: Centrilobular emphysematous changes. Other: None. IMPRESSION: 1. No acute intracranial abnormality. 2. No acute displaced fracture or traumatic listhesis of the cervical spine. Slightly limited evaluation due to motion artifact. 3. 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 4.  Emphysema (ICD10-J43.9). Electronically Signed   By: Tish Frederickson M.D.   On: 03/20/2023 17:11   DG Chest Port 1 View  Result Date: 03/20/2023 CLINICAL DATA:  Questionable sepsis EXAM: PORTABLE CHEST 1 VIEW COMPARISON:  Chest x-ray 10/15/2022 FINDINGS: Postsurgical changes in the medial right upper lobe appear stable from prior. Right apical pleural thickening is unchanged. There is no new focal lung infiltrate, pleural effusion or pneumothorax. The cardiac silhouette is within normal limits. IMPRESSION: 1. No  active disease. 2. Stable postsurgical changes in the medial right upper lobe. Electronically Signed   By: Darliss Cheney M.D.   On: 03/20/2023 15:35    Procedures .Critical Care  Performed by: Lenard Simmer, PA-C Authorized by: Lenard Simmer, PA-C   Critical care provider statement:    Critical care time (minutes):  43   Critical care time was exclusive of:  Separately billable procedures and treating other patients  and teaching time   Critical care was necessary to treat or prevent imminent or life-threatening deterioration of the following conditions:  Sepsis and shock   Critical care was time spent personally by me on the following activities:  Development of treatment plan with patient or surrogate, discussions with consultants, evaluation of patient's response to treatment, examination of patient, ordering and review of laboratory studies, ordering and review of radiographic studies, ordering and performing treatments and interventions, pulse oximetry, re-evaluation of patient's condition, review of old charts and obtaining history from patient or surrogate   I assumed direction of critical care for this patient from another provider in my specialty: no     Care discussed with: admitting provider       Medications Ordered in ED Medications  lactated ringers infusion ( Intravenous New Bag/Given 03/20/23 1752)  Vancomycin (VANCOCIN) 1,500 mg in sodium chloride 0.9 % 500 mL IVPB (1,500 mg Intravenous New Bag/Given 03/20/23 1857)  lactated ringers bolus 1,000 mL (0 mLs Intravenous Stopped 03/20/23 1636)  ceFEPIme (MAXIPIME) 2 g in sodium chloride 0.9 % 100 mL IVPB (0 g Intravenous Stopped 03/20/23 1636)  metroNIDAZOLE (FLAGYL) IVPB 500 mg (0 mg Intravenous Stopped 03/20/23 1747)  lactated ringers bolus 1,000 mL (0 mLs Intravenous Stopped 03/20/23 1745)    And  lactated ringers bolus 1,000 mL (0 mLs Intravenous Stopped 03/20/23 1854)  ondansetron (ZOFRAN) injection 4 mg (4 mg Intravenous  Given 03/20/23 1641)  potassium chloride 10 mEq in 100 mL IVPB (0 mEq Intravenous Stopped 03/20/23 1853)    ED Course/ Medical Decision Making/ A&P                                 Medical Decision Making Amount and/or Complexity of Data Reviewed Labs: ordered. Radiology: ordered. ECG/medicine tests: ordered.  Risk Prescription drug management.   This patient presents to the ED with chief complaint(s) of fall with at least 24 hour downtime, AMS, general feeling of illness with pertinent past medical history of anemia, cancer, hypertension, hyperlipidemia, hypothyroidism.  The complaint involves an extensive differential diagnosis and also carries with it a high risk of complications and morbidity.    The differential diagnosis includes sepsis, rhabdomyolysis, CVA, acute intracranial injury/hemorrhage, acute fracture or subluxation, pneumonia, UTI, bowel obstruction (this list is not exhaustive)   The initial plan is to obtain sepsis workup  Additional history obtained: Additional history obtained from EMS   Initial Assessment:   Exam significant for ill-appearing patient who is tremulous, alert, but confused.  GCS of 14.  She is able to recall the fall yesterday, but is unsure how long she was on the ground for.  She reports a general feeling of "being unwell" but she cannot elaborate beyond this.  She is mildly tachypneic, lungs are clear to auscultation bilaterally.  No cough.  Heart rate is tachycardic in the 120s with regular rhythm.  Appears to be sinus tach on the monitor.  Skin is dry.  Abdomen is soft with generalized tenderness on palpation.  No obvious gross deformities or injuries.  No obvious head injury.  Patient not able to express if she is having any pain or if she is injured.  Independent ECG/labs interpretation:  The following labs were independently interpreted:  Initial lactic elevated at 3.7. CBC with leukocytosis, WBC 19.6.  No anemia. Metabolic panel with  hypokalemia.  Creatinine also above patient's baseline with reduced GFR.  Likely AKI. CK not  elevated.  PT/INR both within normal range. UA with evidence of infection.  Independent visualization and interpretation of imaging: I independently visualized the following imaging with scope of interpretation limited to determining acute life threatening conditions related to emergency care: Chest x-ray, which revealed no acute abnormality, specifically no evidence of pneumonia, pleural effusion, or pulmonary edema.  Will obtain CT head, cervical spine, and chest abdomen pelvis as patient is unable to express what is causing her illness or if she is injured.  CT head and cervical spine without acute abnormality.  CT chest/abdomen/pelvis with bilateral patchy ground glass airspace and air-fluid level within the urinary bladder lumen.    Treatment and Reassessment: Based on patient presentation and laboratory workup thus far, code sepsis was activated.  Unknown source of infection initially, will give lactated ringer's, Flagyl, vancomycin, and cefepime for treatment.  Patient given Zofran for nausea with improvement.   Consultations obtained:   Spoke with on-call [facility] Dr. Katha Cabal who agrees with hospital admission and will come see patient.   Disposition:   Patient to be admitted to [facility].           Final Clinical Impression(s) / ED Diagnoses Final diagnoses:  Sepsis, due to unspecified organism, unspecified whether acute organ dysfunction present [facility])  Emphysematous cystitis  Disorientation    Rx / DC Orders ED Discharge Orders     None         Lenard Simmer, PA-C 03/20/23 1926    Durwin Glaze, MD 03/20/23 2034

## 2023-03-20 NOTE — ED Notes (Signed)
Franchot Gallo [phone] would like an update asap

## 2023-03-20 NOTE — Sepsis Progress Note (Signed)
Sepsis protocol is being followed by [facility]. 

## 2023-03-20 NOTE — ED Notes (Signed)
Phlebotomy unsuccessful at second [facility] set, ABX started, this RN notified Chestine Spore PA

## 2023-03-20 NOTE — ED Notes (Signed)
ED TO INPATIENT HANDOFF REPORT  ED Nurse Name and Phone #: Molli Hazard [phone]  S Name/Age/Gender Andrea Morgan 72 y.o. female Room/Bed: 002C/002C  Code Status   Code Status: Prior  Home/SNF/Other Rehab Patient oriented to: self Is this baseline? No   Triage Complete: Triage complete  Chief Complaint Sepsis (HCC) [A41.9]  Triage Note PT BIB [facility] after called to home by neighbors who had not seen or heard from her x3 days. PT was found on her floor, conscious but confused.  Hx of cancer. ST on monitor, Hr 130 en route, T 97.7. Baseline reported to have no cognitive deficits. Alert to self and situation only.    Allergies Allergies  Allergen Reactions   Oxycontin [Oxycodone] Itching   Sulfa Antibiotics Hives   Morphine And Codeine Itching    Level of Care/Admitting Diagnosis ED Disposition     ED Disposition  Admit   Condition  --   Comment  Hospital Area: [facility] [[id number]]  Level of Care: Progressive [102]  Admit to Progressive based on following criteria: MULTISYSTEM THREATS such as stable sepsis, metabolic/electrolyte imbalance with or without encephalopathy that is responding to early treatment.  May admit patient to [facility] or [facility] if equivalent level of care is available:: Yes  Covid Evaluation: Symptomatic Person Under Investigation (PUI) or recent exposure (last 10 days) *Testing Required*  Diagnosis: Sepsis [facility]) [[id number]]  Admitting Physician: Katha Cabal [[id number]]  Attending Physician: Katha Cabal [[id number]]  Certification:: I certify there are rare and unusual circumstances requiring inpatient admission  Expected Medical Readiness: 03/23/2023          B Medical/Surgery History Past Medical History:  Diagnosis Date   ADHD (attention deficit hyperactivity disorder)    Adrenal adenoma    Anal condyloma    Anemia    Anxiety    Arthritis    arthritis left jaw / pain     Cancer (HCC) 2013   lung cancer    DDD (degenerative disc disease), thoracic    Depression    Dyspnea    with exertion   Dyspnea on exertion    Emphysema/COPD (HCC)    GERD (gastroesophageal reflux disease)    Hiatal hernia    causes no problems per patient   History of adenomatous polyp of colon    06/ 2016 hyperplastic   History of cancer of lower lobe bronchus or lung followed by dr gerhardt-- lov 01/ 2017   06-02-2011  s/p  Right VATS w/ left mini thoracotomy resection 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)   History of condyloma acuminatum    removal anal canal condyloma 11-08-2013   History of kidney stones    passed stones   History of panic attacks    Hyperlipidemia    Hypertension    Hypothyroidism    Insomnia    Liver cyst    per CT in 2012   PAF (paroxysmal atrial fibrillation) [facility]) cardiologist-  dr Patty Sermons   single episode Atrial Fibrillation w/ RVR post-op day 3 the right colectomy surgery on 11-08-2013       Pneumonia    PONV (postoperative nausea and vomiting)    Pulmonary nodule, right    right upper lobe per last Chest CT 05-14-2015   Past Surgical History:  Procedure Laterality Date   APPENDECTOMY  1978   CERVICAL FUSION  1997   C5 -- C7   COLONOSCOPY  last one 10-17-2014   ESOPHAGOGASTRODUODENOSCOPY  05/21/2011  Procedure: ESOPHAGOGASTRODUODENOSCOPY (EGD);  Surgeon: Theda Belfast, MD;  Location: [facility];  Service: Endoscopy;  Laterality: N/A;   EXAMINATION UNDER ANESTHESIA N/A 11/08/2013   Procedure: ANAL EXAM UNDER ANESTHESIA WITH BIOPSY;  Surgeon: Romie Levee, MD;  Location: [facility];  Service: General;  Laterality: N/A;   INTERCOSTAL NERVE BLOCK Right 03/01/2022   Procedure: INTERCOSTAL NERVE BLOCK;  Surgeon: Loreli Slot, MD;  Location: [facility] OR;  Service: Thoracic;  Laterality: Right;   LAPAROSCOPIC PARTIAL COLECTOMY N/A 11/08/2013   Procedure: laparoscopic partial right colectomy;  Surgeon: Romie Levee, MD;  Location: [facility];  Service:  General;  Laterality: N/A;   MASS EXCISION  06/02/2011   Procedure: EXCISION MASS;  Surgeon: Delight Ovens, MD;  Location: [facility] OR;  Service: Thoracic;;  minithoracotomy resection spindle cell tumor right chest   MASS EXCISION N/A 05/06/2016   Procedure: EXCISION ANAL CONDYLOMA;  Surgeon: Romie Levee, MD;  Location: [facility];  Service: General;  Laterality: N/A;   NODE DISSECTION Right 03/01/2022   Procedure: NODE DISSECTION;  Surgeon: Loreli Slot, MD;  Location: [facility] OR;  Service: Thoracic;  Laterality: Right;   THORACIC DISCECTOMY Right 01/13/2016   Procedure: RIGHT THORACIC ONE THORACIC TWO THORACIC LAMINOTOMY AND MICRODISCECTOMY;  Surgeon: Shirlean Kelly, MD;  Location: [facility];  Service: Neurosurgery;  Laterality: Right;   TONSILLECTOMY  1971   TRANSTHORACIC ECHOCARDIOGRAM  08/28/2014   ef 55-60%/  trivial MR and TR   VIDEO BRONCHOSCOPY  05/11/2011   Procedure: VIDEO BRONCHOSCOPY WITH FLUORO;  Surgeon: [name], MD;  Location: [facility];  Service: Cardiopulmonary;  Laterality: Bilateral;   VIDEO BRONCHOSCOPY  06/02/2011   Procedure: VIDEO BRONCHOSCOPY;  Surgeon: Delight Ovens, MD;  Location: [facility] OR;  Service: Thoracic;  Laterality: N/A;     A IV Location/Drains/Wounds Patient Lines/Drains/Airways Status     Active Line/Drains/Airways     Name Placement date Placement time Site Days   Peripheral IV 03/20/23 20 G Left Antecubital 03/20/23  1449  Antecubital  less than 1   Peripheral IV 03/20/23 20 G Right Antecubital 03/20/23  1446  Antecubital  less than 1            Intake/Output Last 24 hours No intake or output data in the 24 hours ending 03/20/23 2019  Labs/Imaging Results for orders placed or performed during the hospital encounter of 03/20/23 (from the past 48 hour(s))  CBG monitoring, ED     Status: Abnormal   Collection Time: 03/20/23  2:35 PM  Result Value Ref Range   Glucose-Capillary 130 (H) 70 - 99 mg/dL     Comment: Glucose reference range applies only to samples taken after fasting for at least 8 hours.  CBC with Differential     Status: Abnormal   Collection Time: 03/20/23  2:42 PM  Result Value Ref Range   WBC 19.6 (H) 4.0 - 10.5 K/uL   RBC 5.39 (H) 3.87 - 5.11 MIL/uL   Hemoglobin 14.0 12.0 - 15.0 g/dL   HCT 29.5 62.1 - 30.8 %   MCV 78.3 (L) 80.0 - 100.0 fL   MCH 26.0 26.0 - 34.0 pg   MCHC 33.2 30.0 - 36.0 g/dL   RDW 65.7 (H) 84.6 - 96.2 %   Platelets 360 150 - 400 K/uL    Comment: REPEATED TO VERIFY   nRBC 1.2 (H) 0.0 - 0.2 %   Neutrophils Relative % 81 %   Neutro Abs 15.9 (H) 1.7 - 7.7 K/uL  Lymphocytes Relative 8 %   Lymphs Abs 1.5 0.7 - 4.0 K/uL   Monocytes Relative 8 %   Monocytes Absolute 1.6 (H) 0.1 - 1.0 K/uL   Eosinophils Relative 0 %   Eosinophils Absolute 0.0 0.0 - 0.5 K/uL   Basophils Relative 0 %   Basophils Absolute 0.1 0.0 - 0.1 K/uL   Immature Granulocytes 3 %   Abs Immature Granulocytes 0.52 (H) 0.00 - 0.07 K/uL    Comment: Performed at [facility], [address] [place], [place] [address]  Comprehensive metabolic panel     Status: Abnormal   Collection Time: 03/20/23  2:42 PM  Result Value Ref Range   Sodium 140 135 - 145 mmol/L   Potassium 3.0 (L) 3.5 - 5.1 mmol/L   Chloride 91 (L) 98 - 111 mmol/L   CO2 29 22 - 32 mmol/L   Glucose, Bld 128 (H) 70 - 99 mg/dL    Comment: Glucose reference range applies only to samples taken after fasting for at least 8 hours.   BUN 68 (H) 8 - 23 mg/dL   Creatinine, Ser 6.04 (H) 0.44 - 1.00 mg/dL   Calcium 54.0 8.9 - 98.1 mg/dL   Total Protein 6.3 (L) 6.5 - 8.1 g/dL   Albumin 3.2 (L) 3.5 - 5.0 g/dL   AST 26 15 - 41 U/L   ALT 24 0 - 44 U/L   Alkaline Phosphatase 59 38 - 126 U/L   Total Bilirubin 1.1 <1.2 mg/dL   GFR, Estimated 27 (L) >60 mL/min    Comment: (NOTE) Calculated using the CKD-EPI Creatinine Equation (2021)    Anion gap 20 (H) 5 - 15    Comment: Performed at [facility], [address] [place], [place] [address]  CK     Status: Abnormal   Collection Time: 03/20/23  2:42 PM  Result Value Ref Range   Total CK 29 (L) 38 - 234 U/L    Comment: Performed at [facility], [address] [place], [place] [address]  Protime-INR     Status: None   Collection Time: 03/20/23  2:42 PM  Result Value Ref Range   Prothrombin Time 13.4 11.4 - 15.2 seconds   INR 1.0 0.8 - 1.2    Comment: (NOTE) INR goal varies based on device and disease states. Performed at [facility], [address], [place], [place] [address]   APTT     Status: Abnormal   Collection Time: 03/20/23  2:42 PM  Result Value Ref Range   aPTT <22 (L) 24 - 36 seconds    Comment: REPEATED TO VERIFY NO CLOT IN TUBE Performed at [facility], [address], [place], [place] [address]   Magnesium     Status: None   Collection Time: 03/20/23  2:42 PM  Result Value Ref Range   Magnesium 2.2 1.7 - 2.4 mg/dL    Comment: Performed at [facility], [address] [place], [place] [address]  I-stat chem 8, ED (not at [facility], [facility] or [facility])     Status: Abnormal   Collection Time: 03/20/23  2:56 PM  Result Value Ref Range   Sodium 138 135 - 145 mmol/L   Potassium 2.9 (L) 3.5 - 5.1 mmol/L   Chloride 94 (L) 98 - 111 mmol/L   BUN 58 (H) 8 - 23 mg/dL   Creatinine, Ser 5.28 (H) 0.44 - 1.00 mg/dL   Glucose, Bld 413 (H)  70 - 99 mg/dL    Comment: Glucose reference range applies only to samples taken after fasting for at least 8 hours.   Calcium, Ion 1.11 (L) 1.15 - 1.40 mmol/L   TCO2 32 22 - 32 mmol/L   Hemoglobin 14.6 12.0 - 15.0 g/dL   HCT 84.1 32.4 - 40.1 %  I-Stat CG4 Lactic Acid     Status: Abnormal   Collection Time: 03/20/23  2:56 PM  Result Value Ref Range   Lactic Acid, Venous 3.7 (HH) 0.5 - 1.9 mmol/L   Comment NOTIFIED PHYSICIAN   Urinalysis, w/ Reflex to Culture (Infection Suspected) -Urine, Clean Catch     Status: Abnormal   Collection Time: 03/20/23  5:15 PM  Result Value Ref Range    Specimen Source URINE, CLEAN CATCH    Color, Urine YELLOW YELLOW   APPearance HAZY (A) CLEAR   Specific Gravity, Urine 1.018 1.005 - 1.030   pH 5.0 5.0 - 8.0   Glucose, UA NEGATIVE NEGATIVE mg/dL   Hgb urine dipstick SMALL (A) NEGATIVE   Bilirubin Urine NEGATIVE NEGATIVE   Ketones, ur 5 (A) NEGATIVE mg/dL   Protein, ur NEGATIVE NEGATIVE mg/dL   Nitrite POSITIVE (A) NEGATIVE   Leukocytes,Ua MODERATE (A) NEGATIVE   RBC / HPF 11-20 0 - 5 RBC/hpf   WBC, UA >50 0 - 5 WBC/hpf    Comment:        Reflex urine culture not performed if WBC <=10, OR if Squamous epithelial cells >5. If Squamous epithelial cells >5 suggest recollection.    Bacteria, UA MANY (A) NONE SEEN   Squamous Epithelial / HPF 0-5 0 - 5 /HPF   Mucus PRESENT    Hyaline Casts, UA PRESENT     Comment: Performed at [facility], [address], [place], [place] [address]  I-Stat CG4 Lactic Acid     Status: Abnormal   Collection Time: 03/20/23  5:49 PM  Result Value Ref Range   Lactic Acid, Venous 3.3 (HH) 0.5 - 1.9 mmol/L   Comment NOTIFIED PHYSICIAN    CT CHEST ABDOMEN PELVIS WO CONTRAST  Result Date: 03/20/2023 CLINICAL DATA:  Sepsis No contrast. PT BIB [facility] after called to home by neighbors who had not seen or heard from her x3 days. PT was found on her floor, conscious but confused. Hx of cancer. EXAM: CT CHEST, ABDOMEN AND PELVIS WITHOUT CONTRAST TECHNIQUE: Multidetector CT imaging of the chest, abdomen and pelvis was performed following the standard protocol without IV contrast. RADIATION DOSE REDUCTION: This exam was performed according to the departmental dose-optimization program which includes automated exposure control, adjustment of the mA and/or kV according to patient size and/or use of iterative reconstruction technique. COMPARISON:  CT chest 09/01/2022, CT abdomen pelvis 04/14/2011 FINDINGS: CHEST: Cardiovascular: The thoracic aorta is normal in caliber. The heart is normal in size. No significant  pericardial effusion. Moderate atherosclerotic plaque. Lungs/Pleura: Status post right upper lobectomy. Centrilobular emphysematous changes. Bilateral patchy ground-glass airspace. No pulmonary nodule. No pulmonary mass. No pulmonary contusion or laceration. No pneumatocele formation. No pleural effusion. No pneumothorax. No hemothorax. Mediastinum/Nodes: No pneumomediastinum. The central airways are patent. The esophagus is unremarkable.  Small hiatal hernia. The thyroid is unremarkable. Limited evaluation for hilar lymphadenopathy on this noncontrast study. No mediastinal or axillary lymphadenopathy. Musculoskeletal/Chest wall No chest wall mass. No acute rib or sternal fracture. No acute spinal fracture. Similar-appearing concavity of the superior endplate of the T4 vertebral body. ABDOMEN / PELVIS: Hepatobiliary: Not enlarged. Vague hypodensity  along the falciform ligament likely focal fatty infiltration. Subcentimeter hypodensities within the liver are chronic and likely representing simple hepatic cysts. The gallbladder is otherwise unremarkable with no radio-opaque gallstones. No biliary ductal dilatation. Pancreas: Normal pancreatic contour. No main pancreatic duct dilatation. Spleen: Not enlarged. No focal lesion. Adrenals/Urinary Tract: No nodularity bilaterally. No hydroureteronephrosis. No nephroureterolithiasis. No contour deforming renal mass. Air-fluid level within the urinary bladder lumen. Otherwise the urinary bladder is unremarkable. Stomach/Bowel: Right hemi colectomy. No small or large bowel wall thickening or dilatation. Colonic diverticulosis. The appendix is unremarkable. Vasculature/Lymphatic: Severe atherosclerotic plaque. No abdominal aorta or iliac aneurysm. No abdominal, pelvic, inguinal lymphadenopathy. Reproductive: Uterus and bilateral adnexal regions are unremarkable. Other: No simple free fluid ascites. No pneumoperitoneum. No mesenteric hematoma identified. No organized fluid  collection. Musculoskeletal: No significant soft tissue hematoma. No acute pelvic fracture. No spinal fracture. Ports and Devices: None. IMPRESSION: 1. Bilateral patchy ground-glass airspace. Findings suggestive of infection/inflammation. 2. Air-fluid level within the urinary bladder lumen-correlate for recent instrumentation. If no such finding, recommend urinalysis for evaluation of possible infection. 3. No acute intrathoracic, intra-abdominal, intrapelvic traumatic injury with limited evaluation due to noncontrast study. 4. No acute fracture or traumatic malalignment of the thoracic or lumbar spine. Other imaging findings of potential clinical significance: 1. Aortic Atherosclerosis (ICD10-I70.0) and Emphysema (ICD10-J43.9). 2. Colonic diverticulosis with no acute diverticulitis. 3. Small hiatal hernia. 4. Status post right upper lobectomy and right hemicolectomy. Electronically Signed   By: Tish Frederickson M.D.   On: 03/20/2023 17:20   CT Head Wo Contrast  Result Date: 03/20/2023 CLINICAL DATA:  Head trauma, minor (Age >= 65y); Neck trauma (Age >= 65y) home by neighbors who had not seen or heard from her x3 days. PT was found on her floor, conscious but confused. Hx of cancer. ST on monitor, Hr 130 en route, T 97.7 EXAM: CT HEAD WITHOUT CONTRAST CT CERVICAL SPINE WITHOUT CONTRAST TECHNIQUE: Multidetector CT imaging of the head and cervical spine was performed following the standard protocol without intravenous contrast. Multiplanar CT image reconstructions of the cervical spine were also generated. RADIATION DOSE REDUCTION: This exam was performed according to the departmental dose-optimization program which includes automated exposure control, adjustment of the mA and/or kV according to patient size and/or use of iterative reconstruction technique. COMPARISON:  CT head and C-spine 10/15/2022 FINDINGS: CT HEAD FINDINGS Brain: Patchy and confluent areas of decreased attenuation are noted throughout the deep  and periventricular white matter of the cerebral hemispheres bilaterally, compatible with chronic microvascular ischemic disease. No evidence of large-territorial acute infarction. No parenchymal hemorrhage. No mass lesion. No extra-axial collection. No mass effect or midline shift. No hydrocephalus. Basilar cisterns are patent. Vascular: No hyperdense vessel. Atherosclerotic calcifications are present within the cavernous internal carotid arteries. Skull: No acute fracture or focal lesion. Sinuses/Orbits: Paranasal sinuses and mastoid air cells are clear. The orbits are unremarkable. Other: None. CT CERVICAL SPINE FINDINGS Alignment: Normal. Skull base and vertebrae: At least moderate degenerative change of the spine. Ligamentum flavum calcification. Osseous fusion of the C5 - C7 levels. Associated moderate severe osseous neural foraminal stenosis at the right C3-C4 levels. Associated severe osseous neural foraminal stenosis at the left C3-C4 and C4-C5 levels. No severe osseous central canal stenosis. No acute fracture with slightly limited evaluation due to motion artifact. No aggressive appearing focal osseous lesion or focal pathologic process. Soft tissues and spinal canal: No prevertebral fluid or swelling. No visible canal hematoma. Upper chest: Centrilobular emphysematous changes. Other: None. IMPRESSION: 1. No  acute intracranial abnormality. 2. No acute displaced fracture or traumatic listhesis of the cervical spine. Slightly limited evaluation due to motion artifact. 3. 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 4.  Emphysema (ICD10-J43.9). Electronically Signed   By: Tish Frederickson M.D.   On: 03/20/2023 17:11   CT Cervical Spine Wo Contrast  Result Date: 03/20/2023 CLINICAL DATA:  Head trauma, minor (Age >= 65y); Neck trauma (Age >= 65y) home by neighbors who had not seen or heard from her x3 days. PT was found on her floor, conscious but confused. Hx of cancer. ST  on monitor, Hr 130 en route, T 97.7 EXAM: CT HEAD WITHOUT CONTRAST CT CERVICAL SPINE WITHOUT CONTRAST TECHNIQUE: Multidetector CT imaging of the head and cervical spine was performed following the standard protocol without intravenous contrast. Multiplanar CT image reconstructions of the cervical spine were also generated. RADIATION DOSE REDUCTION: This exam was performed according to the departmental dose-optimization program which includes automated exposure control, adjustment of the mA and/or kV according to patient size and/or use of iterative reconstruction technique. COMPARISON:  CT head and C-spine 10/15/2022 FINDINGS: CT HEAD FINDINGS Brain: Patchy and confluent areas of decreased attenuation are noted throughout the deep and periventricular white matter of the cerebral hemispheres bilaterally, compatible with chronic microvascular ischemic disease. No evidence of large-territorial acute infarction. No parenchymal hemorrhage. No mass lesion. No extra-axial collection. No mass effect or midline shift. No hydrocephalus. Basilar cisterns are patent. Vascular: No hyperdense vessel. Atherosclerotic calcifications are present within the cavernous internal carotid arteries. Skull: No acute fracture or focal lesion. Sinuses/Orbits: Paranasal sinuses and mastoid air cells are clear. The orbits are unremarkable. Other: None. CT CERVICAL SPINE FINDINGS Alignment: Normal. Skull base and vertebrae: At least moderate degenerative change of the spine. Ligamentum flavum calcification. Osseous fusion of the C5 - C7 levels. Associated moderate severe osseous neural foraminal stenosis at the right C3-C4 levels. Associated severe osseous neural foraminal stenosis at the left C3-C4 and C4-C5 levels. No severe osseous central canal stenosis. No acute fracture with slightly limited evaluation due to motion artifact. No aggressive appearing focal osseous lesion or focal pathologic process. Soft tissues and spinal canal: No  prevertebral fluid or swelling. No visible canal hematoma. Upper chest: Centrilobular emphysematous changes. Other: None. IMPRESSION: 1. No acute intracranial abnormality. 2. No acute displaced fracture or traumatic listhesis of the cervical spine. Slightly limited evaluation due to motion artifact. 3. 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 4.  Emphysema (ICD10-J43.9). Electronically Signed   By: Tish Frederickson M.D.   On: 03/20/2023 17:11   DG Chest Port 1 View  Result Date: 03/20/2023 CLINICAL DATA:  Questionable sepsis EXAM: PORTABLE CHEST 1 VIEW COMPARISON:  Chest x-ray 10/15/2022 FINDINGS: Postsurgical changes in the medial right upper lobe appear stable from prior. Right apical pleural thickening is unchanged. There is no new focal lung infiltrate, pleural effusion or pneumothorax. The cardiac silhouette is within normal limits. IMPRESSION: 1. No active disease. 2. Stable postsurgical changes in the medial right upper lobe. Electronically Signed   By: Darliss Cheney M.D.   On: 03/20/2023 15:35    Pending Labs Unresulted Labs (From admission, onward)     Start     Ordered   03/20/23 1926  SARS Coronavirus 2 by RT PCR (hospital order, performed in [facility] hospital lab) *cepheid single result test* Anterior Nasal Swab  (Tier 2 - SARS Coronavirus 2 by RT PCR (hospital order, performed in  [facility] hospital lab) *cepheid single result test*)  Once,   URGENT        03/20/23 1926   03/20/23 1715  Urine Culture  Once,   R        03/20/23 1715   03/20/23 1445  Blood Culture (routine x 2)  (Undifferentiated presentation (screening labs and basic nursing orders))  BLOOD CULTURE X 2,   STAT      03/20/23 1445            Vitals/Pain Today's Vitals   03/20/23 1830 03/20/23 1845 03/20/23 2000 03/20/23 2015  BP: (!) 142/74 (!) 141/70 (!) 142/59 (!) 140/57  Pulse: (!) 115 (!) 110 (!) 112 (!) 107  Resp: (!) 22 15 16  (!) 22  Temp:      TempSrc:      SpO2:  100% 100% 100% 99%  Weight:      Height:        Isolation Precautions Airborne and Contact precautions  Medications Medications  lactated ringers infusion ( Intravenous New Bag/Given 03/20/23 1752)  Vancomycin (VANCOCIN) 1,500 mg in sodium chloride 0.9 % 500 mL IVPB (1,500 mg Intravenous New Bag/Given 03/20/23 1857)  lactated ringers bolus 1,000 mL (0 mLs Intravenous Stopped 03/20/23 1636)  ceFEPIme (MAXIPIME) 2 g in sodium chloride 0.9 % 100 mL IVPB (0 g Intravenous Stopped 03/20/23 1636)  metroNIDAZOLE (FLAGYL) IVPB 500 mg (0 mg Intravenous Stopped 03/20/23 1747)  lactated ringers bolus 1,000 mL (0 mLs Intravenous Stopped 03/20/23 1745)    And  lactated ringers bolus 1,000 mL (0 mLs Intravenous Stopped 03/20/23 1854)  ondansetron (ZOFRAN) injection 4 mg (4 mg Intravenous Given 03/20/23 1641)  potassium chloride 10 mEq in 100 mL IVPB (0 mEq Intravenous Stopped 03/20/23 1853)    Mobility      Focused Assessments Cardiac Assessment Handoff:  Cardiac Rhythm: Sinus tachycardia Lab Results  Component Value Date   CKTOTAL 29 (L) 03/20/2023   TROPONINI <0.03 08/28/2014   Lab Results  Component Value Date   DDIMER 1.06 (H) 11/05/2012   Does the Patient currently have chest pain?   , Neuro Assessment Handoff:  Swallow screen pass? Yes  Cardiac Rhythm: Sinus tachycardia       Neuro Assessment:   Neuro Checks:      Has TPA been given?  If patient is a Neuro Trauma and patient is going to OR before floor call report to 4N Charge nurse: [phone] or [phone]  , Renal Assessment Handoff:  Hemodialysis Schedule:  Last Hemodialysis date and time:    Restricted appendage:   , Pulmonary Assessment Handoff:  Lung sounds: Bilateral Breath Sounds: Expiratory wheezes        R Recommendations: See Admitting Provider Note  Report given to:   Additional N Confused with a GCS of 13/14. Pt continously asked for water.

## 2023-03-20 NOTE — ED Triage Notes (Signed)
PT BIB [facility] after called to home by neighbors who had not seen or heard from her x3 days. PT was found on her floor, conscious but confused.  Hx of cancer. ST on monitor, Hr 130 en route, T 97.7. Baseline reported to have no cognitive deficits. Alert to self and situation only.

## 2023-03-20 NOTE — H&P (Incomplete)
History and Physical    Patient: Andrea Morgan BJY:[id number] DOB: 12-26-1950 DOA: 03/20/2023 DOS: the patient was seen and examined on 03/21/2023 PCP: Alysia Penna, MD  Patient coming from: Home  Chief Complaint:  Chief Complaint  Patient presents with   Fall   Weakness    History provided by her friend Louis HPI: Andrea Morgan is a 72 y.o. female with medical history significant of emphysema, lung cancer, GERD, hyperlipidemia, paroxysmal A-fib, anxiety, and depression who was brought in by EMS after being found down at her home by her neighbors.  Neighbor Louis reports he and his wife found the patient on the floor at the foot of her bed.  They last saw her about 36 hours before.  Just before Thanksgiving she had been experiencing some nausea and dehydration.  The neighbors brought her some Pedialyte and crackers to eat.  Her doctor called in her some nausea medications.  EMS noted patient had sinus tachycardia.  In the ED, she was tachycardic, intermittently tachypneic and somewhat hypertensive.  Surprisingly she is afebrile.  Satting well on room air.  Upon arrival, patient was pan scanned as she did not recall the events of her fall.  CT head, neck, chest abdomen pelvis were remarkable for bilateral patchy groundglass airspace disease suggestive of infection versus inflammation and she had air-fluid levels in the bladder lumen however her catheterization happened after her CT scan.  There is concern for gas-forming bacteria in her bladder.  Subsequent urinalysis is concerning for acute cystitis.  Urine culture obtained.  She has a significant leukocytosis, WBC 19.6 with left shift.  CK was somewhat low.  Normal magnesium.  She is mildly hypokalemic, potassium 3.0 and was given 10 mEq K.  She was started on broad-spectrum antibiotics cefepime, vancomycin and Flagyl.  Patient fluid resuscitated.  Serum creatinine 1.93.  ED provider consulted hospitalist service to evaluate for  admission.    Review of Systems: unable to review all systems due to the inability of the patient to answer questions. Past Medical History:  Diagnosis Date   ADHD (attention deficit hyperactivity disorder)    Adrenal adenoma    Anal condyloma    Anemia    Anxiety    Arthritis    arthritis left jaw / pain     Cancer (HCC) 2013   lung cancer   DDD (degenerative disc disease), thoracic    Depression    Dyspnea    with exertion   Dyspnea on exertion    Emphysema/COPD (HCC)    GERD (gastroesophageal reflux disease)    Hiatal hernia    causes no problems per patient   History of adenomatous polyp of colon    06/ 2016 hyperplastic   History of cancer of lower lobe bronchus or lung followed by dr gerhardt-- lov 01/ 2017   06-02-2011  s/p  Right VATS w/ left mini thoracotomy resection 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)   History of condyloma acuminatum    removal anal canal condyloma 11-08-2013   History of kidney stones    passed stones   History of panic attacks    Hyperlipidemia    Hypertension    Hypothyroidism    Insomnia    Liver cyst    per CT in 2012   PAF (paroxysmal atrial fibrillation) [facility]) cardiologist-  dr Patty Sermons   single episode Atrial Fibrillation w/ RVR post-op day 3 the right colectomy surgery on 11-08-2013  Pneumonia    PONV (postoperative nausea and vomiting)    Pulmonary nodule, right    right upper lobe per last Chest CT 05-14-2015   Past Surgical History:  Procedure Laterality Date   APPENDECTOMY  1978   CERVICAL FUSION  1997   C5 -- C7   COLONOSCOPY  last one 10-17-2014   ESOPHAGOGASTRODUODENOSCOPY  05/21/2011   Procedure: ESOPHAGOGASTRODUODENOSCOPY (EGD);  Surgeon: Theda Belfast, MD;  Location: [facility];  Service: Endoscopy;  Laterality: N/A;   EXAMINATION UNDER ANESTHESIA N/A 11/08/2013   Procedure: ANAL EXAM UNDER ANESTHESIA WITH BIOPSY;  Surgeon: Romie Levee, MD;  Location: [facility];  Service:  General;  Laterality: N/A;   INTERCOSTAL NERVE BLOCK Right 03/01/2022   Procedure: INTERCOSTAL NERVE BLOCK;  Surgeon: Loreli Slot, MD;  Location: [facility];  Service: Thoracic;  Laterality: Right;   LAPAROSCOPIC PARTIAL COLECTOMY N/A 11/08/2013   Procedure: laparoscopic partial right colectomy;  Surgeon: Romie Levee, MD;  Location: [facility];  Service: General;  Laterality: N/A;   MASS EXCISION  06/02/2011   Procedure: EXCISION MASS;  Surgeon: Delight Ovens, MD;  Location: [facility] OR;  Service: Thoracic;;  minithoracotomy resection spindle cell tumor right chest   MASS EXCISION N/A 05/06/2016   Procedure: EXCISION ANAL CONDYLOMA;  Surgeon: Romie Levee, MD;  Location[facility];  Service: General;  Laterality: N/A;   NODE DISSECTION Right 03/01/2022   Procedure: NODE DISSECTION;  Surgeon: Loreli Slot, MD;  Location: [facility] OR;  Service: Thoracic;  Laterality: Right;   THORACIC DISCECTOMY Right 01/13/2016   Procedure: RIGHT THORACIC ONE THORACIC TWO THORACIC LAMINOTOMY AND MICRODISCECTOMY;  Surgeon: Shirlean Kelly, MD;  Location: [facility];  Service: Neurosurgery;  Laterality: Right;   TONSILLECTOMY  1971   TRANSTHORACIC ECHOCARDIOGRAM  08/28/2014   ef 55-60%/  trivial MR and TR   VIDEO BRONCHOSCOPY  05/11/2011   Procedure: VIDEO BRONCHOSCOPY WITH FLUORO;  Surgeon: [name], MD;  Location: [facility];  Service: Cardiopulmonary;  Laterality: Bilateral;   VIDEO BRONCHOSCOPY  06/02/2011   Procedure: VIDEO BRONCHOSCOPY;  Surgeon: Delight Ovens, MD;  Location: [facility] OR;  Service: Thoracic;  Laterality: N/A;   Social History:  reports that she quit smoking about 10 years ago. Her smoking use included cigarettes. She started smoking about 55 years ago. She has a 45 pack-year smoking history. She has never used smokeless tobacco. She reports current drug use. Drug: Marijuana. She reports that she does not drink alcohol.  Allergies  Allergen Reactions   Oxycontin  [Oxycodone] Itching   Sulfa Antibiotics Hives   Morphine And Codeine Itching    Family History  Problem Relation Age of Onset   Asthma Mother    Heart disease Mother    Lung cancer Father    Kidney cancer Sister    Cancer Sister        kidney   Emphysema Sister    Colon cancer Brother        late 40's   Liver cancer Brother    Asthma Maternal Grandmother    Ovarian cancer Maternal Aunt    Breast cancer Maternal Aunt        x2   Prostate cancer Maternal Uncle    Anesthesia problems Neg Hx    Esophageal cancer Neg Hx    Stomach cancer Neg Hx     Prior to Admission medications   Medication Sig Start Date End Date Taking? Authorizing Provider  albuterol (VENTOLIN HFA) 108 (90 Base) MCG/ACT  inhaler Inhale 2 puffs into the lungs every 6 (six) hours as needed for wheezing or shortness of breath. 10/01/13   [provider]  alendronate (FOSAMAX) 70 MG tablet Take 70 mg by mouth See admin instructions. 70 mg once a week on Thursday    [provider]  ALPRAZolam Prudy Feeler) 1 MG tablet Take 1 mg by mouth 3 (three) times daily as needed for anxiety.    [provider]  ASCORBIC ACID PO Take 1 tablet by mouth daily. Vitamin C, unknown strength.    [provider]  aspirin EC 81 MG tablet Take 81 mg by mouth daily.    [provider]  atorvastatin (LIPITOR) 20 MG tablet Take 20 mg by mouth at bedtime.     [provider]  buPROPion (WELLBUTRIN SR) 150 MG 12 hr tablet Take 150 mg by mouth daily.    [provider]  carvedilol (COREG) 3.125 MG tablet Take 3.125 mg by mouth every morning.    [provider]  Cholecalciferol (VITAMIN D-3 PO) Take 1 capsule by mouth daily.    [provider]  Cyanocobalamin (VITAMIN B-12 PO) Take 1 tablet by mouth daily.    [provider]  desvenlafaxine (PRISTIQ) 50 MG 24 hr tablet Take 50 mg by mouth daily.    [provider]  dexamethasone (DECADRON) 4 MG tablet  Please take 1 tablet twice a day the day before, the day of, and the day after chemotherapy 04/29/22   Heilingoetter, Cassandra L, PA-C  dexamethasone (DECADRON) 4 MG tablet Take 1 tab 2 times daily starting day before pemetrexed. Then take 2 tabs daily x 3 days starting day after cisplatin. Take with food. 05/04/22   Si Gaul, MD  diltiazem (CARDIZEM CD) 120 MG 24 hr capsule TAKE 1 CAPSULE (120 MG TOTAL) BY MOUTH DAILY. 09/06/16   Rosalio Macadamia, NP  donepezil (ARICEPT) 5 MG tablet Take 5 mg by mouth at bedtime.    [provider]  escitalopram (LEXAPRO) 20 MG tablet Take 20 mg by mouth every evening.  12/11/15   [provider]  feeding supplement (ENSURE ENLIVE / ENSURE PLUS) LIQD Take 237 mLs by mouth 3 (three) times daily between meals. 03/08/22   Barrett, Erin R, PA-C  folic acid (FOLVITE) 1 MG tablet Take 1 tablet (1 mg total) by mouth daily. Start 7 days before pemetrexed chemotherapy. Continue until 21 days after pemetrexed completed. 05/04/22   Si Gaul, MD  HYDROcodone-acetaminophen (NORCO) 7.5-325 MG tablet Take 1 tablet by mouth every 4 (four) hours as needed for severe pain. 03/30/22   Loreli Slot, MD  levothyroxine (SYNTHROID) 88 MCG tablet Take 88 mcg by mouth every morning. 08/18/20   [provider]  pantoprazole (PROTONIX) 40 MG tablet Take 40 mg by mouth daily.    [provider]  potassium chloride SA (KLOR-CON M) 20 MEQ tablet Take 1 tablet (20 mEq total) by mouth daily. 05/05/22   Heilingoetter, Cassandra L, PA-C  pregabalin (LYRICA) 25 MG capsule Take 1 capsule (25 mg total) by mouth 2 (two) times daily. 03/15/22   Loreli Slot, MD  prochlorperazine (COMPAZINE) 10 MG tablet Take 1 tablet (10 mg total) by mouth every 6 (six) hours as needed for nausea or vomiting. 05/04/22   Si Gaul, MD  SYMBICORT 160-4.5 MCG/ACT inhaler Inhale 1 puff into the lungs 2 (two) times daily. 05/31/22   [provider]   Tiotropium Bromide-Olodaterol (STIOLTO RESPIMAT) 2.5-2.5 MCG/ACT AERS Inhale 2  puffs into the lungs daily. 03/30/22   [name], MD  zolpidem (AMBIEN) 10 MG tablet Take 10 mg by mouth at bedtime.     [provider]    Physical Exam: Vitals:   03/20/23 2015 03/20/23 2109 03/20/23 2134 03/20/23 2315  BP: (!) 140/57 (!) 122/53  (!) 145/52  Pulse: (!) 107 (!) 114  (!) 102  Resp: (!) 22 (!) 22  (!) 24  Temp:  98.1 F (36.7 C)    TempSrc:  Oral    SpO2: 99% 100%  98%  Weight:   57.8 kg   Height:       GEN:     alert, cooperative ill-appearing female and in no distress    HENT:  mucus membranes tachy, no nasal discharge  EYES:   pupils equal and reactive, no scleral icterus or injection RESP:  clear to auscultation bilaterally, tachypneic on room air CVS:   regular rhythm, tachycardic, distal pulses intact   ABD:  soft, generalized tender; bowel sounds present; no palpable masses, EXT:   Non-tender, baseline ROM, atraumatic, no edema  NEURO:  speech normal, alert, not oriented but follow commands  Skin:   warm and dry, abrasions on bilateral hands  Psych: Normal affect, appropriate speech and behavior   Data Reviewed:  Relevant notes from primary care and specialist visits, past discharge summaries as available in EHR, including Care Everywhere. Prior diagnostic testing as pertinent to current admission diagnoses Updated medications and problem lists for reconciliation ED course, including vitals, labs, imaging, treatment and response to treatment Triage notes, nursing and pharmacy notes and ED provider's notes Notable results as noted in HPI  Assessment and Plan: No notes have been filed under this hospital service. Service: Hospitalist   Sepsis due to urinary versus community-acquired pneumonia Pt tachycardic, tachypneic with leukocytosis. Chest x-ray showed no active disease however CT chest showing bilateral patchy groundglass airspace disease digestive of  infection versus inflammation.  Urinalysis concerning for UTI and CT scan supports this.  Lactic acid 3.7 and is downtrending to 3.3.  Blood cultures obtained in ED. COVID-negative. - Admit to progressive - Follow up blood culture and urine cultures. -Status post vancomycin, Flagyl and cefepime -Ceftriaxone azithromycin starting tomorrow -Repeat lactic acid  Acute kidney injury Serum creatinine 1.93 and BUN 68 compared to 1.07 and 13 on 01/03/2023. -Continue maintenance IV fluids for now -Encourage oral intake when no longer altered -Avoid nephrotoxic agents -Trend creatinine  Hypokalemia Potassium 3.0 on admission.  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 Findings suggestive of infection/inflammation. -Stable on room air  Hyperlipidemia -Resume home meds when able.  Per chart review and patient taking separatory.  Paroxysmal atrial fibrillation Currently sinus rhythm -Cardiac telemetry -Resume home meds when able  Anxiety -Resume home meds when able  Hypothyroidism  - TSH  -  Resume home meds when able   Social concerns Patient's friend Lissa Hoard and his wife Eunice Blase are concerned that the patient is not able to care for herself.  They found bugs, larvae and cat feces all over the furniture and the rest of her house.  They note that the house is "inhabitable."  -TOC consult placed    Advance Care Planning:   Code Status: Full Code   Consults: TOC  Family  Communication: Spoke with friend Netta Corrigan husband to [employer] and provided update.  Severity of Illness: The appropriate patient status for this patient is INPATIENT. Inpatient status is judged to be reasonable and necessary in order to provide the required intensity of service to ensure the patient's safety. The patient's  presenting symptoms, physical exam findings, and initial radiographic and laboratory data in the context of their chronic comorbidities is felt to place them at high risk for further clinical deterioration. Furthermore, it is not anticipated that the patient will be medically stable for discharge from the hospital within 2 midnights of admission.   * I certify that at the point of admission it is my clinical judgment that the patient will require inpatient hospital care spanning beyond 2 midnights from the point of admission due to high intensity of service, high risk for further deterioration and high frequency of surveillance required.*  Author: Katha Cabal, DO 03/21/2023 1:08 AM  For on call review [email or web address].

## 2023-03-21 ENCOUNTER — Encounter (HOSPITAL_COMMUNITY): Payer: Self-pay | Admitting: Family Medicine

## 2023-03-21 DIAGNOSIS — N390 Urinary tract infection, site not specified: Secondary | ICD-10-CM | POA: Insufficient documentation

## 2023-03-21 DIAGNOSIS — E039 Hypothyroidism, unspecified: Secondary | ICD-10-CM | POA: Insufficient documentation

## 2023-03-21 DIAGNOSIS — A419 Sepsis, unspecified organism: Secondary | ICD-10-CM | POA: Diagnosis not present

## 2023-03-21 DIAGNOSIS — N179 Acute kidney failure, unspecified: Secondary | ICD-10-CM | POA: Diagnosis not present

## 2023-03-21 DIAGNOSIS — R6520 Severe sepsis without septic shock: Secondary | ICD-10-CM | POA: Diagnosis not present

## 2023-03-21 LAB — COMPREHENSIVE METABOLIC PANEL
ALT: 14 U/L (ref 0–44)
AST: 18 U/L (ref 15–41)
Albumin: 2 g/dL — ABNORMAL LOW (ref 3.5–5.0)
Alkaline Phosphatase: 37 U/L — ABNORMAL LOW (ref 38–126)
Anion gap: 10 (ref 5–15)
BUN: 48 mg/dL — ABNORMAL HIGH (ref 8–23)
CO2: 27 mmol/L (ref 22–32)
Calcium: 8.2 mg/dL — ABNORMAL LOW (ref 8.9–10.3)
Chloride: 101 mmol/L (ref 98–111)
Creatinine, Ser: 1.24 mg/dL — ABNORMAL HIGH (ref 0.44–1.00)
GFR, Estimated: 46 mL/min — ABNORMAL LOW (ref >60)
Glucose, Bld: 96 mg/dL (ref 70–99)
Potassium: 2.8 mmol/L — ABNORMAL LOW (ref 3.5–5.1)
Sodium: 138 mmol/L (ref 135–145)
Total Bilirubin: 1 mg/dL (ref <1.2)
Total Protein: 4 g/dL — ABNORMAL LOW (ref 6.5–8.1)

## 2023-03-21 LAB — TSH: TSH: 0.022 u[IU]/mL — ABNORMAL LOW (ref 0.350–4.500)

## 2023-03-21 LAB — CBC
HCT: 29.7 % — ABNORMAL LOW (ref 36.0–46.0)
Hemoglobin: 9.5 g/dL — ABNORMAL LOW (ref 12.0–15.0)
MCH: 25.5 pg — ABNORMAL LOW (ref 26.0–34.0)
MCHC: 32 g/dL (ref 30.0–36.0)
MCV: 79.8 fL — ABNORMAL LOW (ref 80.0–100.0)
Platelets: 249 10*3/uL (ref 150–400)
RBC: 3.72 MIL/uL — ABNORMAL LOW (ref 3.87–5.11)
RDW: 17.2 % — ABNORMAL HIGH (ref 11.5–15.5)
WBC: 12.8 10*3/uL — ABNORMAL HIGH (ref 4.0–10.5)
nRBC: 0.4 % — ABNORMAL HIGH (ref 0.0–0.2)

## 2023-03-21 LAB — PROCALCITONIN: Procalcitonin: 0.1 ng/mL

## 2023-03-21 LAB — LACTIC ACID, PLASMA: Lactic Acid, Venous: 1.1 mmol/L (ref 0.5–1.9)

## 2023-03-21 LAB — T4, FREE: Free T4: 1.39 ng/dL — ABNORMAL HIGH (ref 0.61–1.12)

## 2023-03-21 LAB — MAGNESIUM: Magnesium: 1.7 mg/dL (ref 1.7–2.4)

## 2023-03-21 LAB — C-REACTIVE PROTEIN: CRP: 14.5 mg/dL — ABNORMAL HIGH (ref <1.0)

## 2023-03-21 LAB — MRSA NEXT GEN BY PCR, NASAL: MRSA by PCR Next Gen: NOT DETECTED

## 2023-03-21 MED ORDER — LACTATED RINGERS IV BOLUS
1000.0000 mL | Freq: Once | INTRAVENOUS | Status: AC
  Administered 2023-03-21: 1000 mL via INTRAVENOUS

## 2023-03-21 MED ORDER — ENSURE ENLIVE PO LIQD
237.0000 mL | Freq: Three times a day (TID) | ORAL | Status: DC
  Administered 2023-03-21 – 2023-03-27 (×17): 237 mL via ORAL

## 2023-03-21 MED ORDER — ALPRAZOLAM 0.5 MG PO TABS
0.5000 mg | ORAL_TABLET | Freq: Two times a day (BID) | ORAL | Status: DC
  Administered 2023-03-21 – 2023-03-28 (×15): 0.5 mg via ORAL
  Filled 2023-03-21 (×14): qty 1
  Filled 2023-03-21: qty 2

## 2023-03-21 MED ORDER — ALBUTEROL SULFATE HFA 108 (90 BASE) MCG/ACT IN AERS: 2.0000 | INHALATION_SPRAY | Freq: Four times a day (QID) | RESPIRATORY_TRACT | Status: DC | PRN

## 2023-03-21 MED ORDER — POTASSIUM CHLORIDE 10 MEQ/100ML IV SOLN
10.0000 meq | INTRAVENOUS | Status: AC
  Administered 2023-03-21 (×3): 10 meq via INTRAVENOUS
  Filled 2023-03-21 (×3): qty 100

## 2023-03-21 MED ORDER — AZITHROMYCIN 500 MG PO TABS
500.0000 mg | ORAL_TABLET | Freq: Every day | ORAL | Status: DC
  Administered 2023-03-21 – 2023-03-22 (×2): 500 mg via ORAL
  Filled 2023-03-21 (×2): qty 1

## 2023-03-21 MED ORDER — ARFORMOTEROL TARTRATE 15 MCG/2ML IN NEBU
15.0000 ug | INHALATION_SOLUTION | Freq: Two times a day (BID) | RESPIRATORY_TRACT | Status: DC
  Administered 2023-03-21 – 2023-03-28 (×14): 15 ug via RESPIRATORY_TRACT
  Filled 2023-03-21 (×14): qty 2

## 2023-03-21 MED ORDER — POTASSIUM CHLORIDE CRYS ER 20 MEQ PO TBCR: 40.0000 meq | EXTENDED_RELEASE_TABLET | Freq: Once | ORAL | Status: DC

## 2023-03-21 MED ORDER — ALBUTEROL SULFATE (2.5 MG/3ML) 0.083% IN NEBU: 2.5000 mg | INHALATION_SOLUTION | Freq: Four times a day (QID) | RESPIRATORY_TRACT | Status: DC | PRN

## 2023-03-21 MED ORDER — UMECLIDINIUM BROMIDE 62.5 MCG/ACT IN AEPB
1.0000 | INHALATION_SPRAY | Freq: Every day | RESPIRATORY_TRACT | Status: DC
  Administered 2023-03-22 – 2023-03-28 (×7): 1 via RESPIRATORY_TRACT
  Filled 2023-03-21: qty 7

## 2023-03-21 MED ORDER — VENLAFAXINE HCL ER 37.5 MG PO CP24
37.5000 mg | ORAL_CAPSULE | Freq: Every day | ORAL | Status: DC
  Administered 2023-03-21 – 2023-03-28 (×8): 37.5 mg via ORAL
  Filled 2023-03-21 (×8): qty 1

## 2023-03-21 MED ORDER — MOMETASONE FURO-FORMOTEROL FUM 200-5 MCG/ACT IN AERO
2.0000 | INHALATION_SPRAY | Freq: Two times a day (BID) | RESPIRATORY_TRACT | Status: DC
  Administered 2023-03-21 – 2023-03-28 (×15): 2 via RESPIRATORY_TRACT
  Filled 2023-03-21 (×2): qty 8.8

## 2023-03-21 MED ORDER — FOLIC ACID 1 MG PO TABS
1.0000 mg | ORAL_TABLET | Freq: Every day | ORAL | Status: DC
  Administered 2023-03-21 – 2023-03-28 (×8): 1 mg via ORAL
  Filled 2023-03-21 (×8): qty 1

## 2023-03-21 MED ORDER — POTASSIUM CHLORIDE 20 MEQ PO PACK
40.0000 meq | PACK | Freq: Once | ORAL | Status: AC
  Administered 2023-03-21: 40 meq via ORAL
  Filled 2023-03-21: qty 2

## 2023-03-21 MED ORDER — LACTATED RINGERS IV SOLN: INTRAVENOUS | Status: DC

## 2023-03-21 MED ORDER — MELATONIN 3 MG PO TABS
3.0000 mg | ORAL_TABLET | Freq: Every evening | ORAL | Status: DC | PRN
  Administered 2023-03-21 – 2023-03-23 (×4): 3 mg via ORAL
  Filled 2023-03-21 (×4): qty 1

## 2023-03-21 MED ORDER — ASPIRIN 81 MG PO TBEC
81.0000 mg | DELAYED_RELEASE_TABLET | Freq: Every day | ORAL | Status: DC
  Administered 2023-03-21 – 2023-03-28 (×8): 81 mg via ORAL
  Filled 2023-03-21 (×8): qty 1

## 2023-03-21 MED ORDER — BUPROPION HCL ER (SR) 150 MG PO TB12
150.0000 mg | ORAL_TABLET | Freq: Every day | ORAL | Status: DC
  Administered 2023-03-21 – 2023-03-28 (×8): 150 mg via ORAL
  Filled 2023-03-21 (×8): qty 1

## 2023-03-21 MED ORDER — MAGNESIUM SULFATE 2 GM/50ML IV SOLN
2.0000 g | Freq: Once | INTRAVENOUS | Status: AC
  Administered 2023-03-21: 2 g via INTRAVENOUS
  Filled 2023-03-21: qty 50

## 2023-03-21 MED ORDER — PANTOPRAZOLE SODIUM 40 MG PO TBEC
40.0000 mg | DELAYED_RELEASE_TABLET | Freq: Every day | ORAL | Status: DC
  Administered 2023-03-21 – 2023-03-28 (×8): 40 mg via ORAL
  Filled 2023-03-21 (×8): qty 1

## 2023-03-21 MED ORDER — ENOXAPARIN SODIUM 40 MG/0.4ML IJ SOSY
40.0000 mg | PREFILLED_SYRINGE | Freq: Every day | INTRAMUSCULAR | Status: DC
  Administered 2023-03-22 – 2023-03-28 (×7): 40 mg via SUBCUTANEOUS
  Filled 2023-03-21 (×7): qty 0.4

## 2023-03-21 MED ORDER — DILTIAZEM HCL ER COATED BEADS 120 MG PO CP24
120.0000 mg | ORAL_CAPSULE | Freq: Every day | ORAL | Status: DC
  Administered 2023-03-21: 120 mg via ORAL
  Filled 2023-03-21 (×2): qty 1

## 2023-03-21 MED ORDER — ATORVASTATIN CALCIUM 10 MG PO TABS
20.0000 mg | ORAL_TABLET | Freq: Every day | ORAL | Status: DC
  Administered 2023-03-21 – 2023-03-27 (×7): 20 mg via ORAL
  Filled 2023-03-21 (×7): qty 2

## 2023-03-21 MED ORDER — POTASSIUM CHLORIDE CRYS ER 20 MEQ PO TBCR
20.0000 meq | EXTENDED_RELEASE_TABLET | Freq: Once | ORAL | Status: AC
  Administered 2023-03-21: 20 meq via ORAL
  Filled 2023-03-21: qty 1

## 2023-03-21 MED ORDER — OXIDIZED CELLULOSE EX PADS
1.0000 | MEDICATED_PAD | Freq: Once | CUTANEOUS | Status: DC
  Filled 2023-03-21: qty 1

## 2023-03-21 MED ORDER — DONEPEZIL HCL 5 MG PO TABS
5.0000 mg | ORAL_TABLET | Freq: Every day | ORAL | Status: DC
  Administered 2023-03-21 – 2023-03-27 (×7): 5 mg via ORAL
  Filled 2023-03-21 (×7): qty 1

## 2023-03-21 NOTE — Progress Notes (Signed)
[facility] night cross cover note:   I was notified by RN of the patient's morning potassium level of 2.8.  She is in the process of receiving 30 meq of IV kcl.   I subsequently ordered kcl 40 meq po x 1 dose now, and added on a magnesium level.     Newton Pigg, DO Hospitalist

## 2023-03-21 NOTE — Evaluation (Signed)
Physical Therapy Evaluation Patient Details Name: Andrea Morgan MRN: [id number] DOB: 1951/04/02 Today's Date: 03/21/2023  History of Present Illness  72 y.o. female brought in by EMS after being found down at her home. Sepsis present on admission due to combination of UTI and community-acquired pneumonia. PMH: emphysema, COPD, lung cancer, Status post right upper lobectomy and right hemicolectomy, GERD, hyperlipidemia, paroxysmal A-fib, anxiety, and depression.  Clinical Impression  Pt presents with admitting diagnosis above. Session today very limited due to patient anxiety and emotional lability. Pt able to sit EOB with Min A however once seated EOB became very emotional and immediately laid back down stating that "she can't do it right now". 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 Based on today session, patient will benefit from continued inpatient follow up therapy, <3 hours/day. PT will continue to follow.       If plan is discharge home, recommend the following: A lot of help with walking and/or transfers;A little help with bathing/dressing/bathroom;Assistance with cooking/housework;Direct supervision/assist for medications management;Help with stairs or ramp for entrance;Assist for transportation;Supervision due to cognitive status   Can travel by private vehicle   No    Equipment Recommendations Other (comment) (Per accepting facility)  Recommendations for Other Services       Functional Status Assessment Patient has had a recent decline in their functional status and demonstrates the ability to make significant improvements in function in a reasonable and predictable amount of time.     Precautions / Restrictions Precautions Precautions: Fall Restrictions Weight Bearing Restrictions: No      Mobility  Bed Mobility Overal bed mobility: Needs Assistance Bed Mobility:  Supine to Sit, Sit to Supine     Supine to sit: Min assist Sit to supine: Min assist   General bed mobility comments: Min A to sit EOB however immediately became emotional and laid back down.    Transfers                   General transfer comment: Pt refused    Ambulation/Gait                  Stairs            Wheelchair Mobility     Tilt Bed    Modified Rankin (Stroke Patients Only)       Balance Overall balance assessment: Needs assistance Sitting-balance support: Feet supported, Bilateral upper extremity supported Sitting balance-Leahy Scale: Fair   Postural control: Right lateral lean                                   Pertinent Vitals/Pain Pain Assessment Pain Assessment: Faces Faces Pain Scale: Hurts even more Pain Location: back Pain Descriptors / Indicators: Aching, Discomfort, Grimacing Pain Intervention(s): Monitored during session    Home Living Family/patient expects to be discharged to:: Private residence Living Arrangements: Alone Available Help at Discharge: Friend(s);Available PRN/intermittently Type of Home: Apartment Home Access: Stairs to enter Entrance Stairs-Rails: Can reach both Entrance Stairs-Number of Steps: 6   Home Layout: One level Home Equipment: Agricultural consultant (2 wheels) Additional Comments: Pt states that she is loner and doesnt leave her apartment much. Has friends nearby that help out.    Prior Function Prior Level of Function : Independent/Modified Independent;Patient poor historian/Family not available  Mobility Comments: Pt reports that she stays in her apartment and doesnt go out at all. Unclear whether she uses AD as pt was very emotionally labile during session.       Extremity/Trunk Assessment   Upper Extremity Assessment Upper Extremity Assessment: Generalized weakness    Lower Extremity Assessment Lower Extremity Assessment: Generalized weakness     Cervical / Trunk Assessment Cervical / Trunk Assessment: Kyphotic  Communication   Communication Communication: No apparent difficulties Cueing Techniques: Verbal cues  Cognition Arousal: Alert Behavior During Therapy: Anxious, Restless, Lability Overall Cognitive Status: No family/caregiver present to determine baseline cognitive functioning                                 General Comments: Very anxious and labile during session.        General Comments General comments (skin integrity, edema, etc.): VSS    Exercises     Assessment/Plan    PT Assessment Patient needs continued PT services  PT Problem List Decreased strength;Decreased range of motion;Decreased activity tolerance;Decreased mobility;Decreased balance;Decreased coordination;Decreased cognition;Decreased knowledge of use of DME;Decreased safety awareness;Decreased knowledge of precautions;Cardiopulmonary status limiting activity       PT Treatment Interventions DME instruction;Stair training;Gait training;Functional mobility training;Therapeutic activities;Therapeutic exercise;Neuromuscular re-education;Balance training;Patient/family education    PT Goals (Current goals can be found in the Care Plan section)  Acute Rehab PT Goals Patient Stated Goal: to get better PT Goal Formulation: With patient Time For Goal Achievement: 04/04/23 Potential to Achieve Goals: Fair    Frequency Min 1X/week     Co-evaluation               AM-PAC PT "6 Clicks" Mobility  Outcome Measure Help needed turning from your back to your side while in a flat bed without using bedrails?: A Little Help needed moving from lying on your back to sitting on the side of a flat bed without using bedrails?: A Little Help needed moving to and from a bed to a chair (including a wheelchair)?: Total Help needed standing up from a chair using your arms (e.g., wheelchair or bedside chair)?: Total Help needed to walk in  hospital room?: Total Help needed climbing 3-5 steps with a railing? : Total 6 Click Score: 10    End of Session   Activity Tolerance: Patient limited by fatigue;Other (comment) (Anxiety) Patient left: in bed;with call bell/phone within reach;with bed alarm set Nurse Communication: Mobility status PT Visit Diagnosis: Other abnormalities of gait and mobility (R26.89)    Time: 1610-9604 PT Time Calculation (min) (ACUTE ONLY): 15 min   Charges:   PT Evaluation $PT Eval Moderate Complexity: 1 Mod   PT General Charges $$ ACUTE PT VISIT: 1 Visit         Shela Nevin, PT, DPT Acute Rehab Services [phone]   Gladys Damme 03/21/2023, 4:24 PM

## 2023-03-21 NOTE — Progress Notes (Signed)
PROGRESS NOTE                                                                                                                                                                                                             Patient Demographics:    Andrea Morgan, is a 72 y.o. female, DOB - 1951/01/21, WUJ:[id number]  Outpatient Primary MD for the patient is Alysia Penna, MD    LOS - 1  Admit date - 03/20/2023    Chief Complaint  Patient presents with   Fall   Weakness       Brief Narrative (HPI from H&P)    72 y.o. female with medical history significant of emphysema, lung cancer, GERD, hyperlipidemia, paroxysmal A-fib, anxiety, and depression who was brought in by EMS after being found down at her home by her neighbors.  Neighbor Louis reports he and his wife found the patient on the floor at the foot of her bed.  They last saw her about 36 hours before.  Just before Thanksgiving she had been experiencing some nausea and dehydration.  The neighbors brought her some Pedialyte and crackers to eat.  Her doctor called in her some nausea medications.    She was brought to the ER where she was diagnosed with sepsis due to combination of UTI and pneumonia and admitted to the hospital.   Subjective:    Andrea Morgan today has, No headache, No chest pain, No abdominal pain - No Nausea, No new weakness tingling or numbness, no SOB   Assessment  & Plan :   Sepsis present on admission due to combination of UTI and community-acquired pneumonia.  Being treated with IV antibiotics and IV fluids, sepsis pathophysiology much improved, CT head, C-spine, chest abdomen pelvis noted.  Continue empiric IV antibiotics, clinically improved, continue gentle hydration with IV fluids, continue supportive care.  Follow cultures.  Dehydration, AKI, hypokalemia - replace electrolytes, hydrate with IV fluids, AKI improving.  Continue to  monitor.  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  History of underlying COPD.  Supportive care.  Home nebulizer treatments continued, I-S flutter valve added, as needed oxygen as needed.  Paroxysmal atrial fibrillation.  [place] vas 2 score of greater than 3.  Not on anticoagulation at baseline.  Low-dose Cardizem initiated at home dose, home aspirin resumed.  Defer anticoagulation to PCP and primary cardiologist.  Anxiety, depression.  Low-dose benzodiazepine with caution.  Home venlafaxine and bupropion.  Cognitive decline versus early dementia.  Home Aricept.  Risk for delirium.  Minimize narcotics and benzodiazepines.  Hypothyroidism.  TSH suppressed, could be sick euthyroid syndrome, for now hold Synthroid, repeat TSH and free T4.      Condition - Extremely Guarded  Family Communication  :    Called friend Jeanice Lim [phone] at 03/21/2023 at 7:45 AM.  No response.     Left message for friend Eunice Blase [phone] on 03/21/2023 at 7:50 AM  Code Status :  Full  Consults  :  None  PUD Prophylaxis : PPI   Procedures  :     CT chest, abdomen and pelvis.     1. Bilateral patchy ground-glass airspace. Findings suggestive of infection/inflammation. 2. Air-fluid level within the urinary bladder lumen-correlate for recent instrumentation. If no such finding, recommend urinalysis for evaluation of possible infection. 3. No acute intrathoracic, intra-abdominal, intrapelvic traumatic injury with limited evaluation due to noncontrast study. 4. No acute fracture or traumatic malalignment of the thoracic or lumbar spine.   Other imaging findings of potential clinical significance:   1. Aortic Atherosclerosis (ICD10-I70.0) and Emphysema (ICD10-J43.9). 2. Colonic diverticulosis with no acute diverticulitis. 3. Small hiatal hernia. 4. Status post right upper lobectomy and right hemicolectomy.  CT Head and C Spine - Non acute      Disposition Plan  :     Status is: Inpatient  DVT Prophylaxis  :    enoxaparin (LOVENOX) injection 30 mg Start: 03/21/23 1000    Lab Results  Component Value Date   PLT 249 03/21/2023    Diet :  Diet Order             Diet regular Room service appropriate? Yes; Fluid consistency: Thin  Diet effective now                    Inpatient Medications  Scheduled Meds:  ALPRAZolam  0.5 mg Oral BID   arformoterol  15 mcg Nebulization BID   And   umeclidinium bromide  1 puff Inhalation Daily   aspirin EC  81 mg Oral Daily   atorvastatin  20 mg Oral QHS   buPROPion  150 mg Oral Daily   diltiazem  120 mg Oral Daily   donepezil  5 mg Oral QHS   enoxaparin (LOVENOX) injection  30 mg Subcutaneous Daily   feeding supplement  237 mL Oral TID BM   folic acid  1 mg Oral Daily   mometasone-formoterol  2 puff Inhalation BID   pantoprazole  40 mg Oral Daily   potassium chloride  20 mEq Oral Once   venlafaxine XR  37.5 mg Oral Q breakfast   Continuous Infusions:  azithromycin 500 mg (03/21/23 0009)   cefTRIAXone (ROCEPHIN)  IV     lactated ringers     PRN Meds:.acetaminophen **OR** acetaminophen, albuterol, melatonin, ondansetron **OR** ondansetron (ZOFRAN) IV    Objective:   Vitals:   03/20/23 2109 03/20/23 2134 03/20/23 2315 03/21/23 0400  BP: (!) 122/53  (!) 145/52 (!) 114/44  Pulse: (!) 114  (!) 102 96  Resp: (!) 22  (!) 24 19  Temp: 98.1 F (36.7 C)   98 F (36.7 C)  TempSrc: Oral   Oral  SpO2: 100%  98% 100%  Weight:  57.8 kg    Height:        Wt Readings  from Last 3 Encounters:  03/20/23 57.8 kg  01/31/23 65.6 kg  10/15/22 67.1 kg     Intake/Output Summary (Last 24 hours) at 03/21/2023 0803 Last data filed at 03/21/2023 0500 Gross per 24 hour  Intake --  Output 300 ml  Net -300 ml     Physical Exam  Awake but mildly confused, no new F.N deficits, Normal affect B and E.AT,PERRAL Supple Neck, No JVD,   Symmetrical Chest wall movement, Good air movement bilaterally,  CTAB RRR,No Gallops,Rubs or new Murmurs,  +ve B.Sounds, Abd Soft, No tenderness,   No Cyanosis, Clubbing or edema       Data Review:    Recent Labs  Lab 03/20/23 1442 03/20/23 1456 03/21/23 0430  WBC 19.6*  --  12.8*  HGB 14.0 14.6 9.5*  HCT 42.2 43.0 29.7*  PLT 360  --  249  MCV 78.3*  --  79.8*  MCH 26.0  --  25.5*  MCHC 33.2  --  32.0  RDW 17.5*  --  17.2*  LYMPHSABS 1.5  --   --   MONOABS 1.6*  --   --   EOSABS 0.0  --   --   BASOSABS 0.1  --   --     Recent Labs  Lab 03/20/23 1442 03/20/23 1456 03/20/23 1749 03/20/23 2304 03/21/23 0219 03/21/23 0225 03/21/23 0430  NA 140 138  --   --  138  --   --   K 3.0* 2.9*  --   --  2.8*  --   --   CL 91* 94*  --   --  101  --   --   CO2 29  --   --   --  27  --   --   ANIONGAP 20*  --   --   --  10  --   --   GLUCOSE 128* 128*  --   --  96  --   --   BUN 68* 58*  --   --  48*  --   --   CREATININE 1.93* 2.00*  --   --  1.24*  --   --   AST 26  --   --   --  18  --   --   ALT 24  --   --   --  14  --   --   ALKPHOS 59  --   --   --  37*  --   --   BILITOT 1.1  --   --   --  1.0  --   --   ALBUMIN 3.2*  --   --   --  2.0*  --   --   LATICACIDVEN  --  3.7* 3.3* 1.1  --  1.1  --   INR 1.0  --   --   --   --   --   --   TSH  --   --   --  0.022*  --   --   --   HGBA1C  --   --   --  5.3  --   --   --   MG 2.2  --   --   --   --   --  1.7  CALCIUM 10.3  --   --   --  8.2*  --   --       Recent Labs  Lab 03/20/23 1442 03/20/23 1456 03/20/23 1749 03/20/23 2304  03/21/23 0219 03/21/23 0225 03/21/23 0430  LATICACIDVEN  --  3.7* 3.3* 1.1  --  1.1  --   INR 1.0  --   --   --   --   --   --   TSH  --   --   --  0.022*  --   --   --   HGBA1C  --   --   --  5.3  --   --   --   MG 2.2  --   --   --   --   --  1.7  CALCIUM 10.3  --   --   --  8.2*  --   --      Recent Labs    03/20/23 2304  TSH 0.022*    Micro Results Recent Results (from the past 240 hour(s))  Blood Culture (routine x 2)     Status: None  (Preliminary result)   Collection Time: 03/20/23  2:45 PM   Specimen: BLOOD LEFT ARM  Result Value Ref Range Status   Specimen Description BLOOD LEFT ARM  Final   Special Requests   Final    BOTTLES DRAWN AEROBIC AND ANAEROBIC Blood Culture adequate volume   Culture   Final    NO GROWTH < 24 HOURS Performed at [facility], [address] [place], [place] [address]    Report Status PENDING  Incomplete  Blood Culture (routine x 2)     Status: None (Preliminary result)   Collection Time: 03/20/23  6:07 PM   Specimen: BLOOD  Result Value Ref Range Status   Specimen Description BLOOD BLOOD RIGHT HAND  Final   Special Requests   Final    AEROBIC BOTTLE ONLY Blood Culture results may not be optimal due to an inadequate volume of blood received in culture bottles   Culture   Final    NO GROWTH < 12 HOURS Performed at [facility], [address] [place], [place] [address]    Report Status PENDING  Incomplete  SARS Coronavirus 2 by RT PCR (hospital order, performed in [facility] hospital lab) *cepheid single result test* Anterior Nasal Swab     Status: None   Collection Time: 03/20/23  9:45 PM   Specimen: Anterior Nasal Swab  Result Value Ref Range Status   SARS Coronavirus 2 by RT PCR NEGATIVE NEGATIVE Final    Comment: Performed at [facility], [address], [place], [place] [address]    Radiology Reports CT CHEST ABDOMEN PELVIS WO CONTRAST  Result Date: 03/20/2023 CLINICAL DATA:  Sepsis No contrast. PT BIB [facility] after called to home by neighbors who had not seen or heard from her x3 days. PT was found on her floor, conscious but confused. Hx of cancer. EXAM: CT CHEST, ABDOMEN AND PELVIS WITHOUT CONTRAST TECHNIQUE: Multidetector CT imaging of the chest, abdomen and pelvis was performed following the standard protocol without IV contrast. RADIATION DOSE REDUCTION: This exam was performed according to the departmental dose-optimization program which includes  automated exposure control, adjustment of the mA and/or kV according to patient size and/or use of iterative reconstruction technique. COMPARISON:  CT chest 09/01/2022, CT abdomen pelvis 04/14/2011 FINDINGS: CHEST: Cardiovascular: The thoracic aorta is normal in caliber. The heart is normal in size. No significant pericardial effusion. Moderate atherosclerotic plaque. Lungs/Pleura: Status post right upper lobectomy. Centrilobular emphysematous changes. Bilateral patchy ground-glass airspace. No pulmonary nodule. No pulmonary mass. No pulmonary contusion or laceration.  No pneumatocele formation. No pleural effusion. No pneumothorax. No hemothorax. Mediastinum/Nodes: No pneumomediastinum. The central airways are patent. The esophagus is unremarkable.  Small hiatal hernia. The thyroid is unremarkable. Limited evaluation for hilar lymphadenopathy on this noncontrast study. No mediastinal or axillary lymphadenopathy. Musculoskeletal/Chest wall No chest wall mass. No acute rib or sternal fracture. No acute spinal fracture. Similar-appearing concavity of the superior endplate of the T4 vertebral body. ABDOMEN / PELVIS: Hepatobiliary: Not enlarged. Vague hypodensity along the falciform ligament likely focal fatty infiltration. Subcentimeter hypodensities within the liver are chronic and likely representing simple hepatic cysts. The gallbladder is otherwise unremarkable with no radio-opaque gallstones. No biliary ductal dilatation. Pancreas: Normal pancreatic contour. No main pancreatic duct dilatation. Spleen: Not enlarged. No focal lesion. Adrenals/Urinary Tract: No nodularity bilaterally. No hydroureteronephrosis. No nephroureterolithiasis. No contour deforming renal mass. Air-fluid level within the urinary bladder lumen. Otherwise the urinary bladder is unremarkable. Stomach/Bowel: Right hemi colectomy. No small or large bowel wall thickening or dilatation. Colonic diverticulosis. The appendix is unremarkable.  Vasculature/Lymphatic: Severe atherosclerotic plaque. No abdominal aorta or iliac aneurysm. No abdominal, pelvic, inguinal lymphadenopathy. Reproductive: Uterus and bilateral adnexal regions are unremarkable. Other: No simple free fluid ascites. No pneumoperitoneum. No mesenteric hematoma identified. No organized fluid collection. Musculoskeletal: No significant soft tissue hematoma. No acute pelvic fracture. No spinal fracture. Ports and Devices: None. IMPRESSION: 1. Bilateral patchy ground-glass airspace. Findings suggestive of infection/inflammation. 2. Air-fluid level within the urinary bladder lumen-correlate for recent instrumentation. If no such finding, recommend urinalysis for evaluation of possible infection. 3. No acute intrathoracic, intra-abdominal, intrapelvic traumatic injury with limited evaluation due to noncontrast study. 4. No acute fracture or traumatic malalignment of the thoracic or lumbar spine. Other imaging findings of potential clinical significance: 1. Aortic Atherosclerosis (ICD10-I70.0) and Emphysema (ICD10-J43.9). 2. Colonic diverticulosis with no acute diverticulitis. 3. Small hiatal hernia. 4. Status post right upper lobectomy and right hemicolectomy. Electronically Signed   By: Tish Frederickson M.D.   On: 03/20/2023 17:20   CT Head Wo Contrast  Result Date: 03/20/2023 CLINICAL DATA:  Head trauma, minor (Age >= 65y); Neck trauma (Age >= 65y) home by neighbors who had not seen or heard from her x3 days. PT was found on her floor, conscious but confused. Hx of cancer. ST on monitor, Hr 130 en route, T 97.7 EXAM: CT HEAD WITHOUT CONTRAST CT CERVICAL SPINE WITHOUT CONTRAST TECHNIQUE: Multidetector CT imaging of the head and cervical spine was performed following the standard protocol without intravenous contrast. Multiplanar CT image reconstructions of the cervical spine were also generated. RADIATION DOSE REDUCTION: This exam was performed according to the departmental  dose-optimization program which includes automated exposure control, adjustment of the mA and/or kV according to patient size and/or use of iterative reconstruction technique. COMPARISON:  CT head and C-spine 10/15/2022 FINDINGS: CT HEAD FINDINGS Brain: Patchy and confluent areas of decreased attenuation are noted throughout the deep and periventricular white matter of the cerebral hemispheres bilaterally, compatible with chronic microvascular ischemic disease. No evidence of large-territorial acute infarction. No parenchymal hemorrhage. No mass lesion. No extra-axial collection. No mass effect or midline shift. No hydrocephalus. Basilar cisterns are patent. Vascular: No hyperdense vessel. Atherosclerotic calcifications are present within the cavernous internal carotid arteries. Skull: No acute fracture or focal lesion. Sinuses/Orbits: Paranasal sinuses and mastoid air cells are clear. The orbits are unremarkable. Other: None. CT CERVICAL SPINE FINDINGS Alignment: Normal. Skull base and vertebrae: At least moderate degenerative change of the spine. Ligamentum flavum calcification. Osseous fusion of the C5 -  C7 levels. Associated moderate severe osseous neural foraminal stenosis at the right C3-C4 levels. Associated severe osseous neural foraminal stenosis at the left C3-C4 and C4-C5 levels. No severe osseous central canal stenosis. No acute fracture with slightly limited evaluation due to motion artifact. No aggressive appearing focal osseous lesion or focal pathologic process. Soft tissues and spinal canal: No prevertebral fluid or swelling. No visible canal hematoma. Upper chest: Centrilobular emphysematous changes. Other: None. IMPRESSION: 1. No acute intracranial abnormality. 2. No acute displaced fracture or traumatic listhesis of the cervical spine. Slightly limited evaluation due to motion artifact. 3. 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 4.  Emphysema (ICD10-J43.9).  Electronically Signed   By: Tish Frederickson M.D.   On: 03/20/2023 17:11   CT Cervical Spine Wo Contrast  Result Date: 03/20/2023 CLINICAL DATA:  Head trauma, minor (Age >= 65y); Neck trauma (Age >= 65y) home by neighbors who had not seen or heard from her x3 days. PT was found on her floor, conscious but confused. Hx of cancer. ST on monitor, Hr 130 en route, T 97.7 EXAM: CT HEAD WITHOUT CONTRAST CT CERVICAL SPINE WITHOUT CONTRAST TECHNIQUE: Multidetector CT imaging of the head and cervical spine was performed following the standard protocol without intravenous contrast. Multiplanar CT image reconstructions of the cervical spine were also generated. RADIATION DOSE REDUCTION: This exam was performed according to the departmental dose-optimization program which includes automated exposure control, adjustment of the mA and/or kV according to patient size and/or use of iterative reconstruction technique. COMPARISON:  CT head and C-spine 10/15/2022 FINDINGS: CT HEAD FINDINGS Brain: Patchy and confluent areas of decreased attenuation are noted throughout the deep and periventricular white matter of the cerebral hemispheres bilaterally, compatible with chronic microvascular ischemic disease. No evidence of large-territorial acute infarction. No parenchymal hemorrhage. No mass lesion. No extra-axial collection. No mass effect or midline shift. No hydrocephalus. Basilar cisterns are patent. Vascular: No hyperdense vessel. Atherosclerotic calcifications are present within the cavernous internal carotid arteries. Skull: No acute fracture or focal lesion. Sinuses/Orbits: Paranasal sinuses and mastoid air cells are clear. The orbits are unremarkable. Other: None. CT CERVICAL SPINE FINDINGS Alignment: Normal. Skull base and vertebrae: At least moderate degenerative change of the spine. Ligamentum flavum calcification. Osseous fusion of the C5 - C7 levels. Associated moderate severe osseous neural foraminal stenosis at the  right C3-C4 levels. Associated severe osseous neural foraminal stenosis at the left C3-C4 and C4-C5 levels. No severe osseous central canal stenosis. No acute fracture with slightly limited evaluation due to motion artifact. No aggressive appearing focal osseous lesion or focal pathologic process. Soft tissues and spinal canal: No prevertebral fluid or swelling. No visible canal hematoma. Upper chest: Centrilobular emphysematous changes. Other: None. IMPRESSION: 1. No acute intracranial abnormality. 2. No acute displaced fracture or traumatic listhesis of the cervical spine. Slightly limited evaluation due to motion artifact. 3. 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 4.  Emphysema (ICD10-J43.9). Electronically Signed   By: Tish Frederickson M.D.   On: 03/20/2023 17:11   DG Chest Port 1 View  Result Date: 03/20/2023 CLINICAL DATA:  Questionable sepsis EXAM: PORTABLE CHEST 1 VIEW COMPARISON:  Chest x-ray 10/15/2022 FINDINGS: Postsurgical changes in the medial right upper lobe appear stable from prior. Right apical pleural thickening is unchanged. There is no new focal lung infiltrate, pleural effusion or pneumothorax. The cardiac silhouette is within normal limits. IMPRESSION: 1. No active disease. 2. Stable postsurgical changes in the  medial right upper lobe. Electronically Signed   By: Darliss Cheney M.D.   On: 03/20/2023 15:35      Signature  -   Susa Raring M.D on 03/21/2023 at 8:03 AM   -  To page go to [email or web address]

## 2023-03-21 NOTE — Progress Notes (Signed)
PHARMACIST - PHYSICIAN COMMUNICATION °DR:   Singh °CONCERNING: Antibiotic IV to Oral Route Change Policy ° °RECOMMENDATION: °This patient is receiving azithromycin by the intravenous route.  Based on criteria approved by the Pharmacy and Therapeutics Committee, the antibiotic(s) is/are being converted to the equivalent oral dose form(s). ° ° °DESCRIPTION: °These criteria include: °Patient being treated for a respiratory tract infection, urinary tract infection, cellulitis or clostridium difficile associated diarrhea if on metronidazole °The patient is not neutropenic and does not exhibit a GI malabsorption state °The patient is eating (either orally or via tube) and/or has been taking other orally administered medications for a least 24 hours °The patient is improving clinically and has a Tmax < 100.5 ° °If you have questions about this conversion, please contact the Pharmacy Department  °[]  ( [phone] )  [place] °[]  ( [phone] )  [facility] °[x]  ( [phone]  [place] °[]  ( [phone] )  [facility] °[]  ( [phone] )  [facility]  ° °

## 2023-03-21 NOTE — Progress Notes (Addendum)
[facility] night cross cover note:   I was notified by RN that the patient's blood pressure is now slightly lower, with most recent blood pressure documented to be 93/46.  She had a similar blood pressure around 1600, blood pressure was 90/48, before increasing to 107/41.  It appears that she is here with sepsis due to UTI, and is currently on lactated Ringer's at 75 cc/h.  No new symptoms reported.  Additional vital signs appear stable, including afebrile, heart rates in the 90s, oxygen saturation 97% on room air.  I subsequently ordered a 1 L LR bolus to be administered over 2 hours.   Update: systolic BP remains in 90's mmHg following 1 liter LR bolus. Other VS also appear since: afebrile, HR in 90's, O2 sat mid-90's on RA.   In terms of the volume of IVF that she also received: she received 3 liters of IVF at time of admission, about 30 hours ago, and since then has been on LR at 75 cc/hr, leading up to receipt of the 1 liter LR bolus tonight. Will order another 1 liter LR bolus over 2 hours, and also check a stat H&H, as she had some bleeding earlier this evening from her IV cannula removal site.     Newton Pigg, DO Hospitalist

## 2023-03-21 NOTE — Progress Notes (Signed)
[facility] night cross cover note:   I was notified by RN of the patient's request for a sleep aid. I subsequently placed order for prn melatonin for insomnia.     Keelan Tripodi, DO Hospitalist  

## 2023-03-21 NOTE — TOC Initial Note (Signed)
Transition of Care [facility]) - Initial/Assessment Note    Patient Details  Name: Andrea Morgan MRN: [id number] Date of Birth: 28-May-1950  Transition of Care [facility]) CM/SW Contact:    Mearl Latin, LCSW Phone Number: 03/21/2023, 4:21 PM  Clinical Narrative:                 CSW received request to speak with patient's sister, [name], who called patient while OT was in the room. CSW spoke with [name] who shared that patient became upset with her about a year ago and told her she did not want to have contact anymore. Patient receives $700/month in SSI.  Sister's spouse works for [employer] and assisted patient in obtaining [employer] Card ($300) to help pay for utilities per month. Sister stated she received a text from patient's friend, Eunice Blase, to check on patient. [name] stated frustration with the fact that Eunice Blase came to the hospital to ask permission to add [name] to the contact list and they then left to go to their beach house. Patient has two grandsons in [place] that she's never met (by her biological daughter that will not have communication with her).  [name] reported that patient has a lot of memory issues and should not be making decisions. She stated her husband is best to make decisions about SNF options. She does not want [facility]. CSW will send out referrals and present bed offers to sister as available. Their Fax [phone] Attn: Dominic    Expected Discharge Plan: Skilled Nursing Facility Barriers to Discharge: Continued Medical Work up, English as a second language teacher, SNF Authorization Denied   Patient Goals and CMS Choice Patient states their goals for this hospitalization and ongoing recovery are:: Rehab CMS Medicare.gov Compare Post Acute Care list provided to:: Patient Represenative (must comment) Choice offered to / list presented to : Sibling  ownership interest in [facility].provided to:: Sibling    Expected Discharge Plan and Services In-house Referral: Clinical  Social Work   Post Acute Care Choice: Skilled Nursing Facility Living arrangements for the past 2 months: Apartment                                      Prior Living Arrangements/Services Living arrangements for the past 2 months: Apartment Lives with:: Self Patient language and need for interpreter reviewed:: Yes Do you feel safe going back to the place where you live?: Yes      Need for Family Participation in Patient Care: Yes (Comment) Care giver support system in place?: No (comment)   Criminal Activity/Legal Involvement Pertinent to Current Situation/Hospitalization: No - Comment as needed  Activities of Daily Living      Permission Sought/Granted Permission sought to share information with : Facility Medical sales representative, Family Supports Permission granted to share information with : Yes, Verbal Permission Granted  Share Information with NAME: "[name]"  Permission granted to share info w AGENCY: SNFs  Permission granted to share info w Relationship: Sister  Permission granted to share info w Contact Information: [phone]  Emotional Assessment Appearance:: Appears stated age Attitude/Demeanor/Rapport: Unable to Assess Affect (typically observed): Unable to Assess Orientation: : Oriented to Self, Oriented to Place Alcohol / Substance Use: Not Applicable Psych Involvement: No (comment)  Admission diagnosis:  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] Disorientation [R41.0] Sepsis (HCC) [A41.9] Sepsis, due to unspecified organism, unspecified whether acute organ dysfunction present [facility]) [A41.9] Patient Active Problem List   Diagnosis Date Noted  UTI (urinary tract infection) 03/21/2023   Hypothyroidism 03/21/2023   Sepsis (HCC) 03/20/2023   Encounter for antineoplastic chemotherapy 04/29/2022   Adenocarcinoma of lung, 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   Malnutrition of moderate degree 03/03/2022   Lesion of lung 03/01/2022   S/P Robotic Assisted Video Thoracosocpy with  Right Upper Lobectomy 03/01/2022   Pulmonary nodule 12/22/2021   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 AT [address] OF [facility] [address] [place] [address] Phone: [phone] Fax: [phone]  [facility] [address] [place] [address] Phone: [phone] Fax: [phone]     Social Determinants of Health (SDOH) Social History: SDOH Screenings   Food Insecurity: No Food Insecurity (03/07/2022)  Housing: Low Risk  (03/07/2022)  Transportation Needs: No Transportation Needs (03/07/2022)  Utilities: Not At Risk (03/07/2022)  Tobacco Use: Medium Risk (03/20/2023)   SDOH Interventions:     Readmission Risk Interventions     No data to display

## 2023-03-21 NOTE — Progress Notes (Signed)
TRH night cross cover note:   I was notified by RN of bleeding from the patient's IV cannula removal site.  I had initially ordered Surgicel (thombi pads) along with pressure dressing, but hemostasis was ultimately achieved with compression for greater than 10 minutes.    Newton Pigg, DO Hospitalist

## 2023-03-21 NOTE — [place] FL2 (Signed)
[place] MEDICAID FL2 LEVEL OF CARE FORM     IDENTIFICATION  Patient Name: Andrea Morgan Birthdate: November 12, 1950 Sex: female Admission Date (Current Location): 03/20/2023  [facility] and [place] Number:  Producer, television/film/video and Address:  [facility] [place], 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   Malnutrition of moderate degree 03/03/2022   Lesion of lung 03/01/2022   S/P Robotic Assisted Video Thoracosocpy with Right Upper Lobectomy 03/01/2022   Pulmonary nodule 12/22/2021   Thoracic disc herniation 01/13/2016   Chest pain 08/28/2014   Chronic headaches    Anxiety    Cephalalgia    HLD (hyperlipidemia) 03/11/2014   Atrial fibrillation with RVR - post-operative  11/12/2013   History of colonic polyps 11/08/2013   PAF (paroxysmal atrial fibrillation) (HCC) 10/17/2013   COPD (chronic obstructive pulmonary disease) (HCC) 04/26/2013   Adrenal adenoma 04/26/2013   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   Chest mass 05/04/2011    Orientation RESPIRATION BLADDER Height & Weight     Self, Place  Normal Incontinent Weight: 127 lb 6.8 oz (57.8 kg) Height:  5\' 6"  (167.6 cm)  BEHAVIORAL SYMPTOMS/MOOD NEUROLOGICAL BOWEL NUTRITION STATUS      Continent Diet (See dc summary)  AMBULATORY STATUS COMMUNICATION OF NEEDS Skin   Extensive Assist Verbally  Normal                       Personal Care Assistance Level of Assistance  Bathing, Feeding, Dressing Bathing Assistance: Maximum assistance Feeding assistance: Limited assistance Dressing Assistance: Maximum assistance     Functional Limitations Info             SPECIAL CARE FACTORS FREQUENCY  PT (By licensed PT), OT (By licensed OT)     PT Frequency: 5x/week OT Frequency: 5x/week            Contractures Contractures Info: Not present    Additional Factors Info  Code Status, Allergies, Psychotropic Code Status Info: Full Allergies Info: Oxycontin (Oxycodone), Sulfa Antibiotics, Morphine And Codeine Psychotropic Info: See dc summary         Current Medications (03/21/2023):  This is the current hospital active medication list Current Facility-Administered Medications  Medication Dose Route Frequency Provider Last Rate Last Admin   acetaminophen (TYLENOL) tablet 650 mg  650 mg Oral Q6H PRN Brimage, Vondra, DO       Or   acetaminophen (TYLENOL) suppository 650 mg  650 mg Rectal Q6H PRN Brimage, Vondra, DO       albuterol (PROVENTIL) (2.5 MG/3ML) 0.083% nebulizer solution 2.5 mg  2.5 mg Nebulization Q6H PRN Leroy Sea, MD       ALPRAZolam Prudy Feeler) tablet 0.5 mg  0.5 mg Oral BID Susa Raring K, MD   0.5 mg at 03/21/23 1139   arformoterol (BROVANA) nebulizer solution 15 mcg  15 mcg Nebulization  BID Leroy Sea, MD       And   umeclidinium bromide (INCRUSE ELLIPTA) 62.5 MCG/ACT 1 puff  1 puff Inhalation Daily Leroy Sea, MD       aspirin EC tablet 81 mg  81 mg Oral Daily Leroy Sea, MD   81 mg at 03/21/23 1609   atorvastatin (LIPITOR) tablet 20 mg  20 mg Oral QHS Leroy Sea, MD       azithromycin (ZITHROMAX) tablet 500 mg  500 mg Oral QHS Pham, Minh Q, RPH-CPP       buPROPion (WELLBUTRIN SR) 12 hr tablet 150 mg  150 mg Oral Daily Leroy Sea, MD   150 mg at 03/21/23 1139   cefTRIAXone (ROCEPHIN) 2 g in sodium chloride 0.9 %  100 mL IVPB  2 g Intravenous Q24H Brimage, Vondra, DO 200 mL/hr at 03/21/23 0909 2 g at 03/21/23 0909   diltiazem (CARDIZEM CD) 24 hr capsule 120 mg  120 mg Oral Daily Leroy Sea, MD   120 mg at 03/21/23 1139   donepezil (ARICEPT) tablet 5 mg  5 mg Oral QHS Leroy Sea, MD       [START ON 03/22/2023] enoxaparin (LOVENOX) injection 40 mg  40 mg Subcutaneous Daily Pham, Minh Q, RPH-CPP       feeding supplement (ENSURE ENLIVE / ENSURE PLUS) liquid 237 mL  237 mL Oral TID BM Leroy Sea, MD   237 mL at 03/21/23 1608   folic acid (FOLVITE) tablet 1 mg  1 mg Oral Daily Leroy Sea, MD   1 mg at 03/21/23 1608   lactated ringers infusion   Intravenous Continuous Leroy Sea, MD 75 mL/hr at 03/21/23 0907 New Bag at 03/21/23 0907   melatonin tablet 3 mg  3 mg Oral QHS PRN Howerter, Justin B, DO   3 mg at 03/21/23 0115   mometasone-formoterol (DULERA) 200-5 MCG/ACT inhaler 2 puff  2 puff Inhalation BID Leroy Sea, MD   2 puff at 03/21/23 1150   ondansetron (ZOFRAN) tablet 4 mg  4 mg Oral Q6H PRN Katha Cabal, DO       Or   ondansetron (ZOFRAN) injection 4 mg  4 mg Intravenous Q6H PRN Brimage, Vondra, DO   4 mg at 03/21/23 0057   pantoprazole (PROTONIX) EC tablet 40 mg  40 mg Oral Daily Leroy Sea, MD   40 mg at 03/21/23 1139   venlafaxine XR (EFFEXOR-XR) 24 hr capsule 37.5 mg  37.5 mg Oral Q breakfast Leroy Sea, MD   37.5 mg at 03/21/23 1139     Discharge Medications: Please see discharge summary for a list of discharge medications.  Relevant Imaging Results:  Relevant Lab Results:   Additional Information SSN: [id number] [place], [place]

## 2023-03-21 NOTE — Plan of Care (Signed)

## 2023-03-21 NOTE — Evaluation (Signed)
Occupational Therapy Evaluation Patient Details Name: Andrea Morgan MRN: [id number] DOB: 1951/04/06 Today's Date: 03/21/2023   History of Present Illness 72 y.o. female brought in by EMS after being found down at her home. Sepsis present on admission due to combination of UTI and community-acquired pneumonia. PMH: emphysema, COPD, lung cancer, Status post right upper lobectomy and right hemicolectomy, GERD, hyperlipidemia, paroxysmal A-fib, anxiety, and depression.   Clinical Impression   PTA pt living alone independently. Friends apparently "check on her" and assist with errands. Poor historian and limited participation this date due to lethargy. Overall Max A with ADL and mod A with mobility due to below listed deficits. SW notified of pt's sister wanting to talk with her. Patient will benefit from continued inpatient follow up therapy, <3 hours/day. Acute OT to follow.       If plan is discharge home, recommend the following: A lot of help with walking and/or transfers;A lot of help with bathing/dressing/bathroom;Assistance with cooking/housework;Direct supervision/assist for medications management;Direct supervision/assist for financial management;Assist for transportation;Help with stairs or ramp for entrance    Functional Status Assessment  Patient has had a recent decline in their functional status and demonstrates the ability to make significant improvements in function in a reasonable and predictable amount of time.  Equipment Recommendations  None recommended by OT    Recommendations for Other Services       Precautions / Restrictions Precautions Precautions: Fall Restrictions Weight Bearing Restrictions: No      Mobility Bed Mobility Overal bed mobility: Needs Assistance Bed Mobility: Supine to Sit, Sit to Supine     Supine to sit: Min assist Sit to supine: Min assist   General bed mobility comments: Min A to sit EOB however immediately became emotional and laid  back down.    Transfers                   General transfer comment: Pt declined      Balance Overall balance assessment: Needs assistance Sitting-balance support: Feet supported, Bilateral upper extremity supported Sitting balance-Leahy Scale: Fair   Postural control: Right lateral lean                                 ADL either performed or assessed with clinical judgement   ADL Overall ADL's : Needs assistance/impaired Eating/Feeding: Supervision/ safety;Set up   Grooming: Moderate assistance                               Functional mobility during ADLs: Moderate assistance (for bed mobility) General ADL Comments: poor participatioin with ADL tasks during eval. Overall Max A at this time.     Vision   Additional Comments: wears glasses - unsure when     Perception         Praxis         Pertinent Vitals/Pain Pain Assessment Pain Assessment: Faces Faces Pain Scale: Hurts even more Pain Location: back Pain Descriptors / Indicators: Aching, Discomfort, Grimacing Pain Intervention(s): Limited activity within patient's tolerance     Extremity/Trunk Assessment Upper Extremity Assessment Upper Extremity Assessment: Generalized weakness   Lower Extremity Assessment Lower Extremity Assessment: Defer to PT evaluation   Cervical / Trunk Assessment Cervical / Trunk Assessment: Kyphotic   Communication Communication Communication: No apparent difficulties Cueing Techniques: Verbal cues   Cognition Arousal: Lethargic Behavior During Therapy: Anxious, Restless, Lability  Overall Cognitive Status: No family/caregiver present to determine baseline cognitive functioning Area of Impairment: Orientation, Attention, Memory, Following commands, Safety/judgement, Awareness, Problem solving                 Orientation Level: Disoriented to, Time, Situation (knew she was at the hospital but unsure where) Current Attention Level:  Sustained Memory: Decreased short-term memory Following Commands: Follows one step commands inconsistently Safety/Judgement: Decreased awareness of safety, Decreased awareness of deficits Awareness: Intellectual Problem Solving: Slow processing       General Comments  VSS    Exercises     Shoulder Instructions      Home Living Family/patient expects to be discharged to:: Private residence Living Arrangements: Alone Available Help at Discharge: Friend(s);Available PRN/intermittently Type of Home: Apartment Home Access: Stairs to enter Entrance Stairs-Number of Steps: 6 Entrance Stairs-Rails: Can reach both Home Layout: One level     Bathroom Shower/Tub: Producer, television/film/video: Standard     Home Equipment: Agricultural consultant (2 wheels)   Additional Comments: Pt states that she is loner and doesnt leave her apartment much. Has friends nearby that help out.      Prior Functioning/Environment Prior Level of Function : Independent/Modified Independent;Patient poor historian/Family not available             Mobility Comments: Pt reports that she stays in her apartment and doesnt go out at all. Unclear whether she uses AD as pt was very emotionally labile during session. ADLs Comments: Sister states pt has "memory issues"; reports independence; firneds help with groceries/ errands        OT Problem List: Decreased strength;Decreased activity tolerance;Decreased range of motion;Impaired balance (sitting and/or standing);Decreased cognition;Decreased safety awareness;Decreased knowledge of use of DME or AE;Impaired UE functional use      OT Treatment/Interventions: Self-care/ADL training;Therapeutic exercise;DME and/or AE instruction;Therapeutic activities;Cognitive remediation/compensation;Patient/family education;Balance training    OT Goals(Current goals can be found in the care plan section) Acute Rehab OT Goals Patient Stated Goal: none stated OT Goal  Formulation: Patient unable to participate in goal setting Time For Goal Achievement: 04/04/23 Potential to Achieve Goals: Fair  OT Frequency: Min 1X/week    Co-evaluation              AM-PAC OT "6 Clicks" Daily Activity     Outcome Measure Help from another person eating meals?: A Little Help from another person taking care of personal grooming?: A Lot Help from another person toileting, which includes using toliet, bedpan, or urinal?: Total Help from another person bathing (including washing, rinsing, drying)?: Total Help from another person to put on and taking off regular upper body clothing?: Total Help from another person to put on and taking off regular lower body clothing?: Total 6 Click Score: 9   End of Session Nurse Communication: Other (comment) (sister requesting to talk with SW asn Nsg)  Activity Tolerance: Patient limited by fatigue Patient left: in bed;with call bell/phone within reach;with bed alarm set  OT Visit Diagnosis: Unsteadiness on feet (R26.81);Other abnormalities of gait and mobility (R26.89);Muscle weakness (generalized) (M62.81);History of falling (Z91.81);Other symptoms and signs involving cognitive function;Pain Pain - part of body:  (generalized)                Time: 0109-3235 OT Time Calculation (min): 17 min Charges:  OT General Charges $OT Visit: 1 Visit OT Evaluation $OT Eval Moderate Complexity: 1 Mod  Myalynn Lingle, OT/L   Acute OT Clinical Specialist Acute Rehabilitation Services Pager [phone] Office  [phone]   Shanell Aden,HILLARY 03/21/2023, 5:52 PM

## 2023-03-21 NOTE — Evaluation (Signed)
Clinical/Bedside Swallow Evaluation Patient Details  Name: Andrea Morgan MRN: [id number] Date of Birth: [birth year]  Today's Date: 03/21/2023 Time: SLP Start Time (ACUTE ONLY): 1112 SLP Stop Time (ACUTE ONLY): 1123 SLP Time Calculation (min) (ACUTE ONLY): 11 min  Past Medical History:  Past Medical History:  Diagnosis Date   ADHD (attention deficit hyperactivity disorder)    Adrenal adenoma    Anal condyloma    Anemia    Anxiety    Arthritis    arthritis left jaw / pain     Cancer (HCC) 2013   lung cancer   DDD (degenerative disc disease), thoracic    Depression    Dyspnea    with exertion   Dyspnea on exertion    Emphysema/COPD (HCC)    GERD (gastroesophageal reflux disease)    Hiatal hernia    causes no problems per patient   History of adenomatous polyp of colon    06/ 2016 hyperplastic   History of cancer of lower lobe bronchus or lung followed by dr gerhardt-- lov 01/ 2017   06-02-2011  s/p  Right VATS w/ left mini thoracotomy resection 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)   History of condyloma acuminatum    removal anal canal condyloma 11-08-2013   History of kidney stones    passed stones   History of panic attacks    Hyperlipidemia    Hypertension    Hypothyroidism    Insomnia    Liver cyst    per CT in 2012   PAF (paroxysmal atrial fibrillation) [facility]) cardiologist-  dr Patty Sermons   single episode Atrial Fibrillation w/ RVR post-op day 3 the right colectomy surgery on 11-08-2013       Pneumonia    PONV (postoperative nausea and vomiting)    Pulmonary nodule, right    right upper lobe per last Chest CT 05-14-2015   Past Surgical History:  Past Surgical History:  Procedure Laterality Date   APPENDECTOMY  1978   CERVICAL FUSION  1997   C5 -- C7   COLONOSCOPY  last one 10-17-2014   ESOPHAGOGASTRODUODENOSCOPY  05/21/2011   Procedure: ESOPHAGOGASTRODUODENOSCOPY (EGD);  Surgeon: Theda Belfast, MD;  Location: [facility];   Service: Endoscopy;  Laterality: N/A;   EXAMINATION UNDER ANESTHESIA N/A 11/08/2013   Procedure: ANAL EXAM UNDER ANESTHESIA WITH BIOPSY;  Surgeon: Romie Levee, MD;  Location: [facility];  Service: General;  Laterality: N/A;   INTERCOSTAL NERVE BLOCK Right 03/01/2022   Procedure: INTERCOSTAL NERVE BLOCK;  Surgeon: Loreli Slot, MD;  Location: [facility] OR;  Service: Thoracic;  Laterality: Right;   LAPAROSCOPIC PARTIAL COLECTOMY N/A 11/08/2013   Procedure: laparoscopic partial right colectomy;  Surgeon: Romie Levee, MD;  Location: [facility];  Service: General;  Laterality: N/A;   MASS EXCISION  06/02/2011   Procedure: EXCISION MASS;  Surgeon: Delight Ovens, MD;  Location: [facility] OR;  Service: Thoracic;;  minithoracotomy resection spindle cell tumor right chest   MASS EXCISION N/A 05/06/2016   Procedure: EXCISION ANAL CONDYLOMA;  Surgeon: Romie Levee, MD;  Location: [facility];  Service: General;  Laterality: N/A;   NODE DISSECTION Right 03/01/2022   Procedure: NODE DISSECTION;  Surgeon: Loreli Slot, MD;  Location: [facility] OR;  Service: Thoracic;  Laterality: Right;   THORACIC DISCECTOMY Right 01/13/2016   Procedure: RIGHT THORACIC ONE THORACIC TWO THORACIC LAMINOTOMY AND MICRODISCECTOMY;  Surgeon: Shirlean Kelly, MD;  Location: [facility];  Service: Neurosurgery;  Laterality: Right;  TONSILLECTOMY  1971   TRANSTHORACIC ECHOCARDIOGRAM  08/28/2014   ef 55-60%/  trivial MR and TR   VIDEO BRONCHOSCOPY  05/11/2011   Procedure: VIDEO BRONCHOSCOPY WITH FLUORO;  Surgeon: [name], MD;  Location: [facility];  Service: Cardiopulmonary;  Laterality: Bilateral;   VIDEO BRONCHOSCOPY  06/02/2011   Procedure: VIDEO BRONCHOSCOPY;  Surgeon: Delight Ovens, MD;  Location: [facility] OR;  Service: Thoracic;  Laterality: N/A;   HPI:  Andrea Morgan is a 72 yo female presenting to ED 12/1 after being found down at home by neighbors. Admitted with severe sepsis secondary to UTI and PNA. PMH  includes emphysema, lung cancer, GERD, HLD, paroxysmal A-fib, anxiety, depression, COPD    Assessment / Plan / Recommendation  Clinical Impression  Pt reports no prior difficulty with swallowing. Oral motor exam WFL. Observed pt with trials of thin liquids, purees, and solids with no overt s/s of dysphagia or aspiration. Pt is quite confused and reports feeling signficantly different than baseline. Recommend continuing regular texture solids with thin liquids. Given mentation, she may benefit from full assistance for feeding to ensure adeqaute intake. No further SLP f/u is necessary. Will s/o at this time. SLP Visit Diagnosis: Dysphagia, unspecified (R13.10)    Aspiration Risk  Mild aspiration risk    Diet Recommendation Regular;Thin liquid    Liquid Administration via: Cup;Straw Medication Administration: Whole meds with puree Supervision: Patient able to self feed;Full supervision/cueing for compensatory strategies Compensations: Minimize environmental distractions;Slow rate;Small sips/bites Postural Changes: Seated upright at 90 degrees    Other  Recommendations Oral Care Recommendations: Oral care BID    Recommendations for follow up therapy are one component of a multi-disciplinary discharge planning process, led by the attending physician.  Recommendations may be updated based on patient status, additional functional criteria and insurance authorization.  Follow up Recommendations No SLP follow up      Assistance Recommended at Discharge    Functional Status Assessment Patient has not had a recent decline in their functional status  Frequency and Duration            Prognosis Prognosis for improved oropharyngeal function: Good Barriers to Reach Goals: Cognitive deficits      Swallow Study   General HPI: Andrea Morgan is a 72 yo female presenting to ED 12/1 after being found down at home by neighbors. Admitted with severe sepsis secondary to UTI and PNA. PMH includes  emphysema, lung cancer, GERD, HLD, paroxysmal A-fib, anxiety, depression, COPD Type of Study: Bedside Swallow Evaluation Previous Swallow Assessment: none in chart Diet Prior to this Study: Regular;Thin liquids (Level 0) Temperature Spikes Noted: No Respiratory Status: Room air History of Recent Intubation: No Behavior/Cognition: Alert;Cooperative;Confused Oral Cavity Assessment: Dry Oral Care Completed by SLP: No Oral Cavity - Dentition: Adequate natural dentition;Poor condition Vision: Functional for self-feeding Self-Feeding Abilities: Able to feed self Patient Positioning: Upright in bed Baseline Vocal Quality: Normal Volitional Cough: Strong Volitional Swallow: Able to elicit    Oral/Motor/Sensory Function Overall Oral Motor/Sensory Function: Within functional limits   Ice Chips Ice chips: Not tested   Thin Liquid Thin Liquid: Within functional limits Presentation: Straw;Self Fed    Nectar Thick Nectar Thick Liquid: Not tested   Honey Thick Honey Thick Liquid: Not tested   Puree Puree: Within functional limits Presentation: Spoon;Self Fed   Solid     Solid: Within functional limits Presentation: Self Fed      Gwynneth Aliment, M.A., CF-SLP Speech Language Pathology, Acute Rehabilitation Services  Secure  Chat preferred [phone]  03/21/2023,11:32 AM

## 2023-03-21 NOTE — Progress Notes (Signed)
OT Note  Pt seen for eval. Patient will benefit from continued inpatient follow up therapy, <3 hours/day. Full note to follow. Sister called during session and requesting to talk with nsg and SW. Both made aware.   Luisa Dago, OT/L   Acute OT Clinical Specialist Acute Rehabilitation Services Pager [phone] Office [phone]

## 2023-03-22 ENCOUNTER — Inpatient Hospital Stay (HOSPITAL_COMMUNITY): Payer: 59

## 2023-03-22 DIAGNOSIS — A419 Sepsis, unspecified organism: Secondary | ICD-10-CM | POA: Diagnosis not present

## 2023-03-22 DIAGNOSIS — R6520 Severe sepsis without septic shock: Secondary | ICD-10-CM | POA: Diagnosis not present

## 2023-03-22 DIAGNOSIS — N179 Acute kidney failure, unspecified: Secondary | ICD-10-CM | POA: Diagnosis not present

## 2023-03-22 LAB — CBC WITH DIFFERENTIAL/PLATELET
Abs Immature Granulocytes: 0.66 10*3/uL — ABNORMAL HIGH (ref 0.00–0.07)
Basophils Absolute: 0 10*3/uL (ref 0.0–0.1)
Basophils Relative: 0 %
Eosinophils Absolute: 0.3 10*3/uL (ref 0.0–0.5)
Eosinophils Relative: 3 %
HCT: 25.5 % — ABNORMAL LOW (ref 36.0–46.0)
Hemoglobin: 8 g/dL — ABNORMAL LOW (ref 12.0–15.0)
Immature Granulocytes: 8 %
Lymphocytes Relative: 23 %
Lymphs Abs: 1.8 10*3/uL (ref 0.7–4.0)
MCH: 25.4 pg — ABNORMAL LOW (ref 26.0–34.0)
MCHC: 31.4 g/dL (ref 30.0–36.0)
MCV: 81 fL (ref 80.0–100.0)
Monocytes Absolute: 0.6 10*3/uL (ref 0.1–1.0)
Monocytes Relative: 7 %
Neutro Abs: 4.5 10*3/uL (ref 1.7–7.7)
Neutrophils Relative %: 59 %
Platelets: 196 10*3/uL (ref 150–400)
RBC: 3.15 MIL/uL — ABNORMAL LOW (ref 3.87–5.11)
RDW: 17.8 % — ABNORMAL HIGH (ref 11.5–15.5)
Smear Review: ADEQUATE
WBC: 7.9 10*3/uL (ref 4.0–10.5)
nRBC: 0.4 % — ABNORMAL HIGH (ref 0.0–0.2)

## 2023-03-22 LAB — PROCALCITONIN: Procalcitonin: <0.1 ng/mL

## 2023-03-22 LAB — BASIC METABOLIC PANEL
Anion gap: 8 (ref 5–15)
BUN: 25 mg/dL — ABNORMAL HIGH (ref 8–23)
CO2: 24 mmol/L (ref 22–32)
Calcium: 7.6 mg/dL — ABNORMAL LOW (ref 8.9–10.3)
Chloride: 101 mmol/L (ref 98–111)
Creatinine, Ser: 0.81 mg/dL (ref 0.44–1.00)
GFR, Estimated: >60 mL/min (ref >60)
Glucose, Bld: 83 mg/dL (ref 70–99)
Potassium: 3.4 mmol/L — ABNORMAL LOW (ref 3.5–5.1)
Sodium: 133 mmol/L — ABNORMAL LOW (ref 135–145)

## 2023-03-22 LAB — RETICULOCYTES
Immature Retic Fract: 19.6 % — ABNORMAL HIGH (ref 2.3–15.9)
RBC.: 3.34 MIL/uL — ABNORMAL LOW (ref 3.87–5.11)
Retic Count, Absolute: 82.2 10*3/uL (ref 19.0–186.0)
Retic Ct Pct: 2.5 % (ref 0.4–3.1)

## 2023-03-22 LAB — URINE CULTURE: Culture: 40000 — AB

## 2023-03-22 LAB — HEMOGLOBIN AND HEMATOCRIT, BLOOD
HCT: 24.5 % — ABNORMAL LOW (ref 36.0–46.0)
Hemoglobin: 8.1 g/dL — ABNORMAL LOW (ref 12.0–15.0)

## 2023-03-22 LAB — IRON AND TIBC
Iron: 35 ug/dL (ref 28–170)
Saturation Ratios: 15 % (ref 10.4–31.8)
TIBC: 234 ug/dL — ABNORMAL LOW (ref 250–450)
UIBC: 199 ug/dL

## 2023-03-22 LAB — PHOSPHORUS: Phosphorus: 1.7 mg/dL — ABNORMAL LOW (ref 2.5–4.6)

## 2023-03-22 LAB — T4, FREE: Free T4: 1.13 ng/dL — ABNORMAL HIGH (ref 0.61–1.12)

## 2023-03-22 LAB — FERRITIN: Ferritin: 39 ng/mL (ref 11–307)

## 2023-03-22 LAB — MAGNESIUM: Magnesium: 1.9 mg/dL (ref 1.7–2.4)

## 2023-03-22 LAB — TYPE AND SCREEN
ABO/RH(D): A NEG
Antibody Screen: NEGATIVE

## 2023-03-22 LAB — BRAIN NATRIURETIC PEPTIDE: B Natriuretic Peptide: 409.4 pg/mL — ABNORMAL HIGH (ref 0.0–100.0)

## 2023-03-22 LAB — VITAMIN B12: Vitamin B-12: 400 pg/mL (ref 180–914)

## 2023-03-22 LAB — TSH: TSH: 0.018 u[IU]/mL — ABNORMAL LOW (ref 0.350–4.500)

## 2023-03-22 LAB — FOLATE: Folate: 7 ng/mL (ref >5.9)

## 2023-03-22 LAB — C-REACTIVE PROTEIN: CRP: 6.1 mg/dL — ABNORMAL HIGH (ref <1.0)

## 2023-03-22 MED ORDER — LACTATED RINGERS IV BOLUS
1000.0000 mL | Freq: Once | INTRAVENOUS | Status: AC
  Administered 2023-03-22: 1000 mL via INTRAVENOUS

## 2023-03-22 MED ORDER — LACTATED RINGERS IV SOLN: INTRAVENOUS | Status: AC

## 2023-03-22 MED ORDER — POTASSIUM CHLORIDE CRYS ER 20 MEQ PO TBCR
40.0000 meq | EXTENDED_RELEASE_TABLET | Freq: Once | ORAL | Status: AC
  Administered 2023-03-22: 40 meq via ORAL
  Filled 2023-03-22: qty 2

## 2023-03-22 NOTE — Plan of Care (Signed)
  Problem: Health Behavior/Discharge Planning: Goal: Ability to manage health-related needs will improve Outcome: Progressing   Problem: Clinical Measurements: Goal: Ability to maintain clinical measurements within normal limits will improve Outcome: Progressing   Problem: Activity: Goal: Risk for activity intolerance will decrease Outcome: Progressing   Problem: Coping: Goal: Level of anxiety will decrease Outcome: Progressing   

## 2023-03-22 NOTE — Progress Notes (Signed)
   03/21/23 2300  Assess: MEWS Score  Temp 98 F (36.7 C)  BP (!) 92/45  MAP (mmHg) (!) 59  Pulse Rate 94  ECG Heart Rate 92  Resp (!) 22  Level of Consciousness Alert  SpO2 94 %  O2 Device Room Air  Assess: MEWS Score  MEWS Temp 0  MEWS Systolic 1  MEWS Pulse 0  MEWS RR 1  MEWS LOC 0  MEWS Score 2  MEWS Score Color Yellow  Assess: if the MEWS score is Yellow or Red  Were vital signs accurate and taken at a resting state? Yes  Does the patient meet 2 or more of the SIRS criteria? Yes  Does the patient have a confirmed or suspected source of infection? No  MEWS guidelines implemented  Yes, yellow  Treat  MEWS Interventions Considered administering scheduled or prn medications/treatments as ordered  Take Vital Signs  Increase Vital Sign Frequency  Yellow: Q2hr x1, continue Q4hrs until patient remains green for 12hrs  Escalate  MEWS: Escalate Yellow: Discuss with charge nurse and consider notifying provider and/or RRT  Notify: Charge Nurse/RN  Name of Charge Nurse/RN Notified Sarah,RN  Provider Notification  Provider Name/Title Dr.Howerter  Date Provider Notified 03/21/23  Time Provider Notified 2310  Method of Notification  (secure chat)  Notification Reason Other (Comment) (Low BP)  Provider response Other (Comment) (secure chat)  Date of Provider Response 03/21/23  Time of Provider Response 2315  Assess: SIRS CRITERIA  SIRS Temperature  0  SIRS Pulse 1  SIRS Respirations  1  SIRS WBC 0  SIRS Score Sum  2

## 2023-03-22 NOTE — Progress Notes (Signed)
PROGRESS NOTE                                                                                                                                                                                                             Patient Demographics:    Andrea Morgan, is a 72 y.o. female, DOB - 1951/01/27, RUE:[id number]  Outpatient Primary MD for the patient is Alysia Penna, MD    LOS - 2  Admit date - 03/20/2023    Chief Complaint  Patient presents with   Fall   Weakness       Brief Narrative (HPI from H&P)    72 y.o. female with medical history significant of emphysema, lung cancer, GERD, hyperlipidemia, paroxysmal A-fib, anxiety, and depression who was brought in by EMS after being found down at her home by her neighbors.  Neighbor Louis reports he and his wife found the patient on the floor at the foot of her bed.  They last saw her about 36 hours before.  Just before Thanksgiving she had been experiencing some nausea and dehydration.  The neighbors brought her some Pedialyte and crackers to eat.  Her doctor called in her some nausea medications.    She was brought to the ER where she was diagnosed with sepsis due to combination of UTI and pneumonia and admitted to the hospital.   Subjective:    Patient in bed, appears comfortable, denies any headache, no fever, no chest pain or pressure, no shortness of breath , no abdominal pain. No new focal weakness.   Assessment  & Plan :   Sepsis present on admission due to combination of UTI and community-acquired pneumonia.  Being treated with IV antibiotics and IV fluids, sepsis pathophysiology much improved, CT head, C-spine, chest abdomen pelvis noted.  Continue empiric IV antibiotics, clinically improved, continue gentle hydration with IV fluids, continue supportive care clinically improving.  Follow cultures, preliminary urine cultures growing 40,000 colonies of  Klebsiella.  Dehydration, AKI, hypokalemia - replace electrolytes, hydrate with IV fluids, AKI improving.  Continue to monitor.  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  History of underlying COPD.  Supportive care.  Home nebulizer treatments continued, I-S flutter valve added, as needed oxygen as needed.  Paroxysmal atrial fibrillation.  [place] vas 2 score of greater than 3.  Not on  anticoagulation at baseline.  Low-dose Cardizem initiated at home dose, home aspirin resumed.  Defer anticoagulation to PCP and primary cardiologist.  Anxiety, depression.  Low-dose benzodiazepine with caution.  Home venlafaxine and bupropion.  Cognitive decline versus early dementia.  Home Aricept.  Risk for delirium.  Minimize narcotics and benzodiazepines.  Hypothyroidism.  TSH suppressed, could be sick euthyroid syndrome, for now hold Synthroid, repeat TSH and free T4.      Condition - Extremely Guarded  Family Communication  :    Called friend Jeanice Lim [phone] at 03/21/2023 at 7:45 AM.  No response.     Left message for friend Eunice Blase [phone] on 03/21/2023 at 7:50 AM  Code Status :  Full  Consults  :  None  PUD Prophylaxis : PPI   Procedures  :     CT chest, abdomen and pelvis.     1. Bilateral patchy ground-glass airspace. Findings suggestive of infection/inflammation. 2. Air-fluid level within the urinary bladder lumen-correlate for recent instrumentation. If no such finding, recommend urinalysis for evaluation of possible infection. 3. No acute intrathoracic, intra-abdominal, intrapelvic traumatic injury with limited evaluation due to noncontrast study. 4. No acute fracture or traumatic malalignment of the thoracic or lumbar spine.   Other imaging findings of potential clinical significance:   1. Aortic Atherosclerosis (ICD10-I70.0) and Emphysema (ICD10-J43.9). 2. Colonic diverticulosis with no acute diverticulitis. 3. Small hiatal hernia. 4. Status  post right upper lobectomy and right hemicolectomy.  CT Head and C Spine - Non acute      Disposition Plan  :    Status is: Inpatient  DVT Prophylaxis  :    enoxaparin (LOVENOX) injection 40 mg Start: 03/22/23 1000    Lab Results  Component Value Date   PLT 196 03/22/2023    Diet :  Diet Order             Diet regular Room service appropriate? Yes; Fluid consistency: Thin  Diet effective now                    Inpatient Medications  Scheduled Meds:  ALPRAZolam  0.5 mg Oral BID   arformoterol  15 mcg Nebulization BID   And   umeclidinium bromide  1 puff Inhalation Daily   aspirin EC  81 mg Oral Daily   atorvastatin  20 mg Oral QHS   azithromycin  500 mg Oral QHS   buPROPion  150 mg Oral Daily   diltiazem  120 mg Oral Daily   donepezil  5 mg Oral QHS   enoxaparin (LOVENOX) injection  40 mg Subcutaneous Daily   feeding supplement  237 mL Oral TID BM   folic acid  1 mg Oral Daily   mometasone-formoterol  2 puff Inhalation BID   oxidized cellulose  1 each Topical Once   pantoprazole  40 mg Oral Daily   venlafaxine XR  37.5 mg Oral Q breakfast   Continuous Infusions:  cefTRIAXone (ROCEPHIN)  IV 2 g (03/22/23 0851)   PRN Meds:.acetaminophen **OR** acetaminophen, albuterol, melatonin, ondansetron **OR** ondansetron (ZOFRAN) IV    Objective:   Vitals:   03/22/23 0013 03/22/23 0100 03/22/23 0500 03/22/23 0700  BP: (!) 92/47 (!) 104/43 (!) 110/47 (!) 98/55  Pulse: 88 89 87 78  Resp: (!) 22 (!) 22 (!) 22 (!) 23  Temp: 98.2 F (36.8 C) 98 F (36.7 C) 97.9 F (36.6 C)   TempSrc: Oral Oral Oral   SpO2: 96% 94% 93% 97%  Weight:  Height:        Wt Readings from Last 3 Encounters:  03/20/23 57.8 kg  01/31/23 65.6 kg  10/15/22 67.1 kg     Intake/Output Summary (Last 24 hours) at 03/22/2023 0959 Last data filed at 03/21/2023 1953 Gross per 24 hour  Intake 240 ml  Output 1000 ml  Net -760 ml     Physical Exam  Awake but mildly confused, no  new F.N deficits, Normal affect [place].AT,PERRAL Supple Neck, No JVD,   Symmetrical Chest wall movement, Good air movement bilaterally, CTAB RRR,No Gallops,Rubs or new Murmurs,  +ve B.Sounds, Abd Soft, No tenderness,   No Cyanosis, Clubbing or edema       Data Review:    Recent Labs  Lab 03/20/23 1442 03/20/23 1456 03/21/23 0430 03/22/23 0030 03/22/23 0434  WBC 19.6*  --  12.8*  --  7.9  HGB 14.0 14.6 9.5* 8.1* 8.0*  HCT 42.2 43.0 29.7* 24.5* 25.5*  PLT 360  --  249  --  196  MCV 78.3*  --  79.8*  --  81.0  MCH 26.0  --  25.5*  --  25.4*  MCHC 33.2  --  32.0  --  31.4  RDW 17.5*  --  17.2*  --  17.8*  LYMPHSABS 1.5  --   --   --  1.8  MONOABS 1.6*  --   --   --  0.6  EOSABS 0.0  --   --   --  0.3  BASOSABS 0.1  --   --   --  0.0    Recent Labs  Lab 03/20/23 1442 03/20/23 1445 03/20/23 1456 03/20/23 1749 03/20/23 2304 03/21/23 0151 03/21/23 0219 03/21/23 0225 03/21/23 0430 03/22/23 0434  NA 140  --  138  --   --   --  138  --   --  133*  K 3.0*  --  2.9*  --   --   --  2.8*  --   --  3.4*  CL 91*  --  94*  --   --   --  101  --   --  101  CO2 29  --   --   --   --   --  27  --   --  24  ANIONGAP 20*  --   --   --   --   --  10  --   --  8  GLUCOSE 128*  --  128*  --   --   --  96  --   --  83  BUN 68*  --  58*  --   --   --  48*  --   --  25*  CREATININE 1.93*  --  2.00*  --   --   --  1.24*  --   --  0.81  AST 26  --   --   --   --   --  18  --   --   --   ALT 24  --   --   --   --   --  14  --   --   --   ALKPHOS 59  --   --   --   --   --  37*  --   --   --   BILITOT 1.1  --   --   --   --   --  1.0  --   --   --  ALBUMIN 3.2*  --   --   --   --   --  2.0*  --   --   --   CRP  --  14.5*  --   --   --   --   --   --   --  6.1*  PROCALCITON  --   --   --   --   --  0.10  --   --   --  <0.10  LATICACIDVEN  --   --  3.7* 3.3* 1.1  --   --  1.1  --   --   INR 1.0  --   --   --   --   --   --   --   --   --   TSH  --   --   --   --  0.022*  --   --   --   --   0.018*  HGBA1C  --   --   --   --  5.3  --   --   --   --   --   MG 2.2  --   --   --   --   --   --   --  1.7 1.9  CALCIUM 10.3  --   --   --   --   --  8.2*  --   --  7.6*      Recent Labs  Lab 03/20/23 1442 03/20/23 1445 03/20/23 1456 03/20/23 1749 03/20/23 2304 03/21/23 0151 03/21/23 0219 03/21/23 0225 03/21/23 0430 03/22/23 0434  CRP  --  14.5*  --   --   --   --   --   --   --  6.1*  PROCALCITON  --   --   --   --   --  0.10  --   --   --  <0.10  LATICACIDVEN  --   --  3.7* 3.3* 1.1  --   --  1.1  --   --   INR 1.0  --   --   --   --   --   --   --   --   --   TSH  --   --   --   --  0.022*  --   --   --   --  0.018*  HGBA1C  --   --   --   --  5.3  --   --   --   --   --   MG 2.2  --   --   --   --   --   --   --  1.7 1.9  CALCIUM 10.3  --   --   --   --   --  8.2*  --   --  7.6*     Recent Labs    03/22/23 0434  TSH 0.018*  FREET4 1.13*    Micro Results Recent Results (from the past 240 hour(s))  Blood Culture (routine x 2)     Status: None (Preliminary result)   Collection Time: 03/20/23  2:45 PM   Specimen: BLOOD LEFT ARM  Result Value Ref Range Status   Specimen Description BLOOD LEFT ARM  Final   Special Requests   Final    BOTTLES DRAWN AEROBIC AND ANAEROBIC Blood Culture adequate volume   Culture   Final    NO GROWTH 2  DAYS Performed at [facility], [address] [place], [place] [address]    Report Status PENDING  Incomplete  Urine Culture     Status: Abnormal   Collection Time: 03/20/23  5:15 PM   Specimen: Urine, Random  Result Value Ref Range Status   Specimen Description URINE, RANDOM  Final   Special Requests   Final    NONE Reflexed from [phone] Performed at [facility], [address] [place], [place] [address]    Culture 40,000 COLONIES/mL KLEBSIELLA PNEUMONIAE (A)  Final   Report Status 03/22/2023 FINAL  Final   Organism ID, Bacteria KLEBSIELLA PNEUMONIAE (A)  Final      Susceptibility   Klebsiella pneumoniae -  MIC*    AMPICILLIN >=32 RESISTANT Resistant     CEFAZOLIN <=4 SENSITIVE Sensitive     CEFEPIME <=0.12 SENSITIVE Sensitive     CEFTRIAXONE <=0.25 SENSITIVE Sensitive     CIPROFLOXACIN <=0.25 SENSITIVE Sensitive     GENTAMICIN <=1 SENSITIVE Sensitive     IMIPENEM <=0.25 SENSITIVE Sensitive     NITROFURANTOIN 64 INTERMEDIATE Intermediate     TRIMETH/SULFA <=20 SENSITIVE Sensitive     AMPICILLIN/SULBACTAM 4 SENSITIVE Sensitive     PIP/TAZO <=4 SENSITIVE Sensitive ug/mL    * 40,000 COLONIES/mL KLEBSIELLA PNEUMONIAE  Blood Culture (routine x 2)     Status: None (Preliminary result)   Collection Time: 03/20/23  6:07 PM   Specimen: BLOOD  Result Value Ref Range Status   Specimen Description BLOOD BLOOD RIGHT HAND  Final   Special Requests   Final    AEROBIC BOTTLE ONLY Blood Culture results may not be optimal due to an inadequate volume of blood received in culture bottles   Culture   Final    NO GROWTH 2 DAYS Performed at [facility], [address] [place], [place] [address]    Report Status PENDING  Incomplete  SARS Coronavirus 2 by RT PCR (hospital order, performed in [facility] hospital lab) *cepheid single result test* Anterior Nasal Swab     Status: None   Collection Time: 03/20/23  9:45 PM   Specimen: Anterior Nasal Swab  Result Value Ref Range Status   SARS Coronavirus 2 by RT PCR NEGATIVE NEGATIVE Final    Comment: Performed at [facility], [address] [place], [place] [address]  MRSA Next Gen by PCR, Nasal     Status: None   Collection Time: 03/21/23  7:57 AM   Specimen: Nasal Mucosa; Nasal Swab  Result Value Ref Range Status   MRSA by PCR Next Gen NOT DETECTED NOT DETECTED Final    Comment: (NOTE) The GeneXpert MRSA Assay (FDA approved for NASAL specimens only), is one component of a comprehensive MRSA colonization surveillance program. It is not intended to diagnose MRSA infection nor to guide or monitor treatment for MRSA infections. Test performance  is not FDA approved in patients less than 20 years old. Performed at [facility], [address], [place], [place] [address]     Radiology Reports DG Chest [place] 1 View  Result Date: 03/22/2023 CLINICAL DATA:  Short of breath. EXAM: PORTABLE CHEST 1 VIEW COMPARISON:  03/20/2023 and older studies. FINDINGS: Cardiac silhouette is normal in size. No mediastinal or hilar masses. Stable changes from previous right lung surgery. Prominent interstitial markings noted bilaterally. No lung consolidation or convincing pulmonary edema. No pleural effusion and no pneumothorax. Skeletal structures are grossly intact. IMPRESSION: 1. No acute cardiopulmonary  disease. Electronically Signed   By: Amie Portland M.D.   On: 03/22/2023 08:26   CT CHEST ABDOMEN PELVIS WO CONTRAST  Result Date: 03/20/2023 CLINICAL DATA:  Sepsis No contrast. PT BIB [facility] after called to home by neighbors who had not seen or heard from her x3 days. PT was found on her floor, conscious but confused. Hx of cancer. EXAM: CT CHEST, ABDOMEN AND PELVIS WITHOUT CONTRAST TECHNIQUE: Multidetector CT imaging of the chest, abdomen and pelvis was performed following the standard protocol without IV contrast. RADIATION DOSE REDUCTION: This exam was performed according to the departmental dose-optimization program which includes automated exposure control, adjustment of the mA and/or kV according to patient size and/or use of iterative reconstruction technique. COMPARISON:  CT chest 09/01/2022, CT abdomen pelvis 04/14/2011 FINDINGS: CHEST: Cardiovascular: The thoracic aorta is normal in caliber. The heart is normal in size. No significant pericardial effusion. Moderate atherosclerotic plaque. Lungs/Pleura: Status post right upper lobectomy. Centrilobular emphysematous changes. Bilateral patchy ground-glass airspace. No pulmonary nodule. No pulmonary mass. No pulmonary contusion or laceration. No pneumatocele formation. No pleural effusion. No  pneumothorax. No hemothorax. Mediastinum/Nodes: No pneumomediastinum. The central airways are patent. The esophagus is unremarkable.  Small hiatal hernia. The thyroid is unremarkable. Limited evaluation for hilar lymphadenopathy on this noncontrast study. No mediastinal or axillary lymphadenopathy. Musculoskeletal/Chest wall No chest wall mass. No acute rib or sternal fracture. No acute spinal fracture. Similar-appearing concavity of the superior endplate of the T4 vertebral body. ABDOMEN / PELVIS: Hepatobiliary: Not enlarged. Vague hypodensity along the falciform ligament likely focal fatty infiltration. Subcentimeter hypodensities within the liver are chronic and likely representing simple hepatic cysts. The gallbladder is otherwise unremarkable with no radio-opaque gallstones. No biliary ductal dilatation. Pancreas: Normal pancreatic contour. No main pancreatic duct dilatation. Spleen: Not enlarged. No focal lesion. Adrenals/Urinary Tract: No nodularity bilaterally. No hydroureteronephrosis. No nephroureterolithiasis. No contour deforming renal mass. Air-fluid level within the urinary bladder lumen. Otherwise the urinary bladder is unremarkable. Stomach/Bowel: Right hemi colectomy. No small or large bowel wall thickening or dilatation. Colonic diverticulosis. The appendix is unremarkable. Vasculature/Lymphatic: Severe atherosclerotic plaque. No abdominal aorta or iliac aneurysm. No abdominal, pelvic, inguinal lymphadenopathy. Reproductive: Uterus and bilateral adnexal regions are unremarkable. Other: No simple free fluid ascites. No pneumoperitoneum. No mesenteric hematoma identified. No organized fluid collection. Musculoskeletal: No significant soft tissue hematoma. No acute pelvic fracture. No spinal fracture. Ports and Devices: None. IMPRESSION: 1. Bilateral patchy ground-glass airspace. Findings suggestive of infection/inflammation. 2. Air-fluid level within the urinary bladder lumen-correlate for recent  instrumentation. If no such finding, recommend urinalysis for evaluation of possible infection. 3. No acute intrathoracic, intra-abdominal, intrapelvic traumatic injury with limited evaluation due to noncontrast study. 4. No acute fracture or traumatic malalignment of the thoracic or lumbar spine. Other imaging findings of potential clinical significance: 1. Aortic Atherosclerosis (ICD10-I70.0) and Emphysema (ICD10-J43.9). 2. Colonic diverticulosis with no acute diverticulitis. 3. Small hiatal hernia. 4. Status post right upper lobectomy and right hemicolectomy. Electronically Signed   By: Tish Frederickson M.D.   On: 03/20/2023 17:20   CT Head Wo Contrast  Result Date: 03/20/2023 CLINICAL DATA:  Head trauma, minor (Age >= 65y); Neck trauma (Age >= 65y) home by neighbors who had not seen or heard from her x3 days. PT was found on her floor, conscious but confused. Hx of cancer. ST on monitor, Hr 130 en route, T 97.7 EXAM: CT HEAD WITHOUT CONTRAST CT CERVICAL SPINE WITHOUT CONTRAST TECHNIQUE: Multidetector CT imaging of the head and cervical spine was  performed following the standard protocol without intravenous contrast. Multiplanar CT image reconstructions of the cervical spine were also generated. RADIATION DOSE REDUCTION: This exam was performed according to the departmental dose-optimization program which includes automated exposure control, adjustment of the mA and/or kV according to patient size and/or use of iterative reconstruction technique. COMPARISON:  CT head and C-spine 10/15/2022 FINDINGS: CT HEAD FINDINGS Brain: Patchy and confluent areas of decreased attenuation are noted throughout the deep and periventricular white matter of the cerebral hemispheres bilaterally, compatible with chronic microvascular ischemic disease. No evidence of large-territorial acute infarction. No parenchymal hemorrhage. No mass lesion. No extra-axial collection. No mass effect or midline shift. No hydrocephalus. Basilar  cisterns are patent. Vascular: No hyperdense vessel. Atherosclerotic calcifications are present within the cavernous internal carotid arteries. Skull: No acute fracture or focal lesion. Sinuses/Orbits: Paranasal sinuses and mastoid air cells are clear. The orbits are unremarkable. Other: None. CT CERVICAL SPINE FINDINGS Alignment: Normal. Skull base and vertebrae: At least moderate degenerative change of the spine. Ligamentum flavum calcification. Osseous fusion of the C5 - C7 levels. Associated moderate severe osseous neural foraminal stenosis at the right C3-C4 levels. Associated severe osseous neural foraminal stenosis at the left C3-C4 and C4-C5 levels. No severe osseous central canal stenosis. No acute fracture with slightly limited evaluation due to motion artifact. No aggressive appearing focal osseous lesion or focal pathologic process. Soft tissues and spinal canal: No prevertebral fluid or swelling. No visible canal hematoma. Upper chest: Centrilobular emphysematous changes. Other: None. IMPRESSION: 1. No acute intracranial abnormality. 2. No acute displaced fracture or traumatic listhesis of the cervical spine. Slightly limited evaluation due to motion artifact. 3. 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 4.  Emphysema (ICD10-J43.9). Electronically Signed   By: Tish Frederickson M.D.   On: 03/20/2023 17:11   CT Cervical Spine Wo Contrast  Result Date: 03/20/2023 CLINICAL DATA:  Head trauma, minor (Age >= 65y); Neck trauma (Age >= 65y) home by neighbors who had not seen or heard from her x3 days. PT was found on her floor, conscious but confused. Hx of cancer. ST on monitor, Hr 130 en route, T 97.7 EXAM: CT HEAD WITHOUT CONTRAST CT CERVICAL SPINE WITHOUT CONTRAST TECHNIQUE: Multidetector CT imaging of the head and cervical spine was performed following the standard protocol without intravenous contrast. Multiplanar CT image reconstructions of the cervical spine were  also generated. RADIATION DOSE REDUCTION: This exam was performed according to the departmental dose-optimization program which includes automated exposure control, adjustment of the mA and/or kV according to patient size and/or use of iterative reconstruction technique. COMPARISON:  CT head and C-spine 10/15/2022 FINDINGS: CT HEAD FINDINGS Brain: Patchy and confluent areas of decreased attenuation are noted throughout the deep and periventricular white matter of the cerebral hemispheres bilaterally, compatible with chronic microvascular ischemic disease. No evidence of large-territorial acute infarction. No parenchymal hemorrhage. No mass lesion. No extra-axial collection. No mass effect or midline shift. No hydrocephalus. Basilar cisterns are patent. Vascular: No hyperdense vessel. Atherosclerotic calcifications are present within the cavernous internal carotid arteries. Skull: No acute fracture or focal lesion. Sinuses/Orbits: Paranasal sinuses and mastoid air cells are clear. The orbits are unremarkable. Other: None. CT CERVICAL SPINE FINDINGS Alignment: Normal. Skull base and vertebrae: At least moderate degenerative change of the spine. Ligamentum flavum calcification. Osseous fusion of the C5 - C7 levels. Associated moderate severe osseous neural foraminal stenosis at the right C3-C4 levels. Associated severe osseous neural foraminal stenosis at the left  C3-C4 and C4-C5 levels. No severe osseous central canal stenosis. No acute fracture with slightly limited evaluation due to motion artifact. No aggressive appearing focal osseous lesion or focal pathologic process. Soft tissues and spinal canal: No prevertebral fluid or swelling. No visible canal hematoma. Upper chest: Centrilobular emphysematous changes. Other: None. IMPRESSION: 1. No acute intracranial abnormality. 2. No acute displaced fracture or traumatic listhesis of the cervical spine. Slightly limited evaluation due to motion artifact. 3. Severe  osseous neural foraminal stenosis at the left C3-C4 and C4-C5 levels. 4.  Emphysema (ICD10-J43.9). Electronically Signed   By: Tish Frederickson M.D.   On: 03/20/2023 17:11   DG Chest Port 1 View  Result Date: 03/20/2023 CLINICAL DATA:  Questionable sepsis EXAM: PORTABLE CHEST 1 VIEW COMPARISON:  Chest x-ray 10/15/2022 FINDINGS: Postsurgical changes in the medial right upper lobe appear stable from prior. Right apical pleural thickening is unchanged. There is no new focal lung infiltrate, pleural effusion or pneumothorax. The cardiac silhouette is within normal limits. IMPRESSION: 1. No active disease. 2. Stable postsurgical changes in the medial right upper lobe. Electronically Signed   By: Darliss Cheney M.D.   On: 03/20/2023 15:35      Signature  -   Susa Raring M.D on 03/22/2023 at 9:59 AM   -  To page go to [email or web address]

## 2023-03-23 DIAGNOSIS — R6520 Severe sepsis without septic shock: Secondary | ICD-10-CM | POA: Diagnosis not present

## 2023-03-23 DIAGNOSIS — A419 Sepsis, unspecified organism: Secondary | ICD-10-CM | POA: Diagnosis not present

## 2023-03-23 DIAGNOSIS — N179 Acute kidney failure, unspecified: Secondary | ICD-10-CM | POA: Diagnosis not present

## 2023-03-23 LAB — T4, FREE: Free T4: 0.89 ng/dL (ref 0.61–1.12)

## 2023-03-23 LAB — CBC WITH DIFFERENTIAL/PLATELET
Abs Immature Granulocytes: 0 10*3/uL (ref 0.00–0.07)
Basophils Absolute: 0 10*3/uL (ref 0.0–0.1)
Basophils Relative: 0 %
Eosinophils Absolute: 0.1 10*3/uL (ref 0.0–0.5)
Eosinophils Relative: 1 %
HCT: 26.4 % — ABNORMAL LOW (ref 36.0–46.0)
Hemoglobin: 8.3 g/dL — ABNORMAL LOW (ref 12.0–15.0)
Lymphocytes Relative: 10 %
Lymphs Abs: 0.9 10*3/uL (ref 0.7–4.0)
MCH: 25.2 pg — ABNORMAL LOW (ref 26.0–34.0)
MCHC: 31.4 g/dL (ref 30.0–36.0)
MCV: 80.2 fL (ref 80.0–100.0)
Monocytes Absolute: 0.6 10*3/uL (ref 0.1–1.0)
Monocytes Relative: 7 %
Neutro Abs: 7.1 10*3/uL (ref 1.7–7.7)
Neutrophils Relative %: 82 %
Platelets: 204 10*3/uL (ref 150–400)
RBC: 3.29 MIL/uL — ABNORMAL LOW (ref 3.87–5.11)
RDW: 17.6 % — ABNORMAL HIGH (ref 11.5–15.5)
WBC: 8.6 10*3/uL (ref 4.0–10.5)
nRBC: 0 % (ref 0.0–0.2)
nRBC: 0 /100{WBCs}

## 2023-03-23 LAB — BASIC METABOLIC PANEL
Anion gap: 3 — ABNORMAL LOW (ref 5–15)
BUN: 15 mg/dL (ref 8–23)
CO2: 26 mmol/L (ref 22–32)
Calcium: 7.6 mg/dL — ABNORMAL LOW (ref 8.9–10.3)
Chloride: 105 mmol/L (ref 98–111)
Creatinine, Ser: 0.85 mg/dL (ref 0.44–1.00)
GFR, Estimated: >60 mL/min (ref >60)
Glucose, Bld: 101 mg/dL — ABNORMAL HIGH (ref 70–99)
Potassium: 3.7 mmol/L (ref 3.5–5.1)
Sodium: 134 mmol/L — ABNORMAL LOW (ref 135–145)

## 2023-03-23 LAB — C-REACTIVE PROTEIN: CRP: 3.4 mg/dL — ABNORMAL HIGH (ref <1.0)

## 2023-03-23 LAB — MAGNESIUM: Magnesium: 1.9 mg/dL (ref 1.7–2.4)

## 2023-03-23 LAB — PHOSPHORUS: Phosphorus: <1 mg/dL — CL (ref 2.5–4.6)

## 2023-03-23 LAB — TSH: TSH: 0.058 u[IU]/mL — ABNORMAL LOW (ref 0.350–4.500)

## 2023-03-23 LAB — PROCALCITONIN: Procalcitonin: <0.1 ng/mL

## 2023-03-23 LAB — BRAIN NATRIURETIC PEPTIDE: B Natriuretic Peptide: 725 pg/mL — ABNORMAL HIGH (ref 0.0–100.0)

## 2023-03-23 MED ORDER — AMOXICILLIN-POT CLAVULANATE 875-125 MG PO TABS
1.0000 | ORAL_TABLET | Freq: Two times a day (BID) | ORAL | Status: AC
  Administered 2023-03-24 – 2023-03-25 (×3): 1 via ORAL
  Filled 2023-03-23 (×4): qty 1

## 2023-03-23 MED ORDER — HALOPERIDOL LACTATE 5 MG/ML IJ SOLN
5.0000 mg | Freq: Once | INTRAMUSCULAR | Status: AC
  Administered 2023-03-23: 5 mg via INTRAMUSCULAR
  Filled 2023-03-23: qty 1

## 2023-03-23 MED ORDER — QUETIAPINE FUMARATE 25 MG PO TABS
25.0000 mg | ORAL_TABLET | Freq: Two times a day (BID) | ORAL | Status: DC
  Administered 2023-03-23 – 2023-03-25 (×5): 25 mg via ORAL
  Filled 2023-03-23 (×5): qty 1

## 2023-03-23 MED ORDER — FERROUS SULFATE 325 (65 FE) MG PO TABS
325.0000 mg | ORAL_TABLET | Freq: Two times a day (BID) | ORAL | Status: DC
  Administered 2023-03-23 – 2023-03-28 (×9): 325 mg via ORAL
  Filled 2023-03-23 (×9): qty 1

## 2023-03-23 MED ORDER — QUETIAPINE FUMARATE 25 MG PO TABS: 25.0000 mg | ORAL_TABLET | Freq: Every day | ORAL | Status: DC

## 2023-03-23 MED ORDER — DILTIAZEM HCL 60 MG PO TABS
60.0000 mg | ORAL_TABLET | Freq: Two times a day (BID) | ORAL | Status: DC
  Administered 2023-03-23 – 2023-03-28 (×9): 60 mg via ORAL
  Filled 2023-03-23 (×11): qty 1

## 2023-03-23 MED ORDER — K PHOS MONO-SOD PHOS DI & MONO 155-852-130 MG PO TABS
500.0000 mg | ORAL_TABLET | Freq: Once | ORAL | Status: AC
  Administered 2023-03-23: 500 mg via ORAL
  Filled 2023-03-23: qty 2

## 2023-03-23 MED ORDER — LACTATED RINGERS IV BOLUS
1000.0000 mL | Freq: Once | INTRAVENOUS | Status: AC
  Administered 2023-03-23: 1000 mL via INTRAVENOUS

## 2023-03-23 MED ORDER — SODIUM PHOSPHATES 45 MMOLE/15ML IV SOLN
30.0000 mmol | Freq: Two times a day (BID) | INTRAVENOUS | Status: AC
  Administered 2023-03-23 (×2): 30 mmol via INTRAVENOUS
  Filled 2023-03-23 (×3): qty 10

## 2023-03-23 NOTE — Plan of Care (Signed)
  Problem: Education: Goal: Knowledge of General Education information will improve Description: Including pain rating scale, medication(s)/side effects and non-pharmacologic comfort measures Outcome: Progressing   Problem: Health Behavior/Discharge Planning: Goal: Ability to manage health-related needs will improve Outcome: Progressing   Problem: Clinical Measurements: Goal: Ability to maintain clinical measurements within normal limits will improve Outcome: Progressing Goal: Will remain free from infection Outcome: Progressing Goal: Diagnostic test results will improve Outcome: Progressing Goal: Respiratory complications will improve Outcome: Progressing Goal: Cardiovascular complication will be avoided Outcome: Progressing   Problem: Activity: Goal: Risk for activity intolerance will decrease Outcome: Progressing   Problem: Nutrition: Goal: Adequate nutrition will be maintained Outcome: Progressing   Problem: Coping: Goal: Level of anxiety will decrease Outcome: Progressing   Problem: Elimination: Goal: Will not experience complications related to bowel motility Outcome: Progressing Goal: Will not experience complications related to urinary retention Outcome: Progressing   Problem: Pain Management: Goal: General experience of comfort will improve Outcome: Progressing   Problem: Safety: Goal: Ability to remain free from injury will improve Outcome: Progressing   Problem: Skin Integrity: Goal: Risk for impaired skin integrity will decrease Outcome: Progressing   Problem: Fluid Volume: Goal: Hemodynamic stability will improve Outcome: Progressing   Problem: Clinical Measurements: Goal: Diagnostic test results will improve Outcome: Progressing Goal: Signs and symptoms of infection will decrease Outcome: Progressing   Problem: Respiratory: Goal: Ability to maintain adequate ventilation will improve Outcome: Progressing   Problem: Safety: Goal:  Non-violent Restraint(s) Outcome: Progressing

## 2023-03-23 NOTE — Progress Notes (Signed)
PROGRESS NOTE                                                                                                                                                                                                             Patient Demographics:    Andrea Morgan, is a 72 y.o. female, DOB - Mar 23, 1951, WJX:[id number]  Outpatient Primary MD for the patient is Alysia Penna, MD    LOS - 3  Admit date - 03/20/2023    Chief Complaint  Patient presents with   Fall   Weakness       Brief Narrative (HPI from H&P)    72 y.o. female with medical history significant of emphysema, lung cancer, GERD, hyperlipidemia, paroxysmal A-fib, anxiety, and depression who was brought in by EMS after being found down at her home by her neighbors.  Neighbor Louis reports he and his wife found the patient on the floor at the foot of her bed.  They last saw her about 36 hours before.  Just before Thanksgiving she had been experiencing some nausea and dehydration.  The neighbors brought her some Pedialyte and crackers to eat.  Her doctor called in her some nausea medications.    She was brought to the ER where she was diagnosed with sepsis due to combination of UTI and pneumonia and admitted to the hospital.   Subjective:    Patient in bed, appears comfortable, denies any headache, no fever, no chest pain or pressure, no shortness of breath , no abdominal pain. No new focal weakness.   Assessment  & Plan :   Sepsis present on admission due to combination of UTI and community-acquired pneumonia.  Being treated with IV antibiotics and IV fluids, sepsis pathophysiology much improved, CT head, C-spine, chest abdomen pelvis noted.  Continue empiric IV antibiotics, clinically improved, continue gentle hydration with IV fluids, continue supportive care clinically improving.  Follow cultures, preliminary urine cultures growing 40,000 colonies of  Klebsiella.  Dehydration, AKI, hypokalemia, hyponatremia- replace electrolytes, hydrate with IV fluids, AKI improving.  Continue to monitor.  Continue gentle hydration on 03/23/2023.  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  History of underlying COPD.  Supportive care.  Home nebulizer treatments continued, I-S flutter valve added, as needed oxygen as needed.  Paroxysmal atrial fibrillation.  [place] vas 2 score of  greater than 3.  Not on anticoagulation at baseline.  Low-dose Cardizem initiated at lower than home dose as blood pressure is soft, home aspirin resumed.  Defer anticoagulation to PCP and primary cardiologist.  Anxiety, depression.  Low-dose benzodiazepine with caution.  Home venlafaxine and bupropion.  Hypophosphatemia.  Replace.    Cognitive decline versus early dementia.  Home Aricept.  Risk for delirium.  Minimize narcotics and benzodiazepines.  Hypothyroidism.  TSH suppressed, could be sick euthyroid syndrome, for now hold Synthroid, repeat TSH and free T4.  Metabolic encephalopathy, nighttime hospital-acquired delirium.  Minimize narcotics and benzodiazepines, nighttime Seroquel.      Condition - Extremely Guarded  Family Communication  :    Called friend Jeanice Lim [phone] at 03/21/2023 at 7:45 AM.  No response.     Left message for friend Eunice Blase [phone] on 03/21/2023 at 7:50 AM  Code Status :  Full  Consults  :  None  PUD Prophylaxis : PPI   Procedures  :     CT chest, abdomen and pelvis.     1. Bilateral patchy ground-glass airspace. Findings suggestive of infection/inflammation. 2. Air-fluid level within the urinary bladder lumen-correlate for recent instrumentation. If no such finding, recommend urinalysis for evaluation of possible infection. 3. No acute intrathoracic, intra-abdominal, intrapelvic traumatic injury with limited evaluation due to noncontrast study. 4. No acute fracture or traumatic malalignment of the  thoracic or lumbar spine.   Other imaging findings of potential clinical significance:   1. Aortic Atherosclerosis (ICD10-I70.0) and Emphysema (ICD10-J43.9). 2. Colonic diverticulosis with no acute diverticulitis. 3. Small hiatal hernia. 4. Status post right upper lobectomy and right hemicolectomy.  CT Head and C Spine - Non acute      Disposition Plan  :    Status is: Inpatient  DVT Prophylaxis  :    enoxaparin (LOVENOX) injection 40 mg Start: 03/22/23 1000    Lab Results  Component Value Date   PLT 204 03/23/2023    Diet :  Diet Order             Diet regular Room service appropriate? Yes; Fluid consistency: Thin  Diet effective now                    Inpatient Medications  Scheduled Meds:  ALPRAZolam  0.5 mg Oral BID   arformoterol  15 mcg Nebulization BID   And   umeclidinium bromide  1 puff Inhalation Daily   aspirin EC  81 mg Oral Daily   atorvastatin  20 mg Oral QHS   azithromycin  500 mg Oral QHS   buPROPion  150 mg Oral Daily   diltiazem  60 mg Oral Q12H   donepezil  5 mg Oral QHS   enoxaparin (LOVENOX) injection  40 mg Subcutaneous Daily   feeding supplement  237 mL Oral TID BM   ferrous sulfate  325 mg Oral BID WC   folic acid  1 mg Oral Daily   mometasone-formoterol  2 puff Inhalation BID   oxidized cellulose  1 each Topical Once   pantoprazole  40 mg Oral Daily   QUEtiapine  25 mg Oral QHS   venlafaxine XR  37.5 mg Oral Q breakfast   Continuous Infusions:  cefTRIAXone (ROCEPHIN)  IV 2 g (03/22/23 0851)   lactated ringers     sodium PHOSPHATE IVPB (in mmol)     PRN Meds:.acetaminophen **OR** acetaminophen, albuterol, melatonin, ondansetron **OR** ondansetron (ZOFRAN) IV    Objective:   Vitals:  03/22/23 2025 03/22/23 2028 03/22/23 2334 03/23/23 0318  BP:   (!) 96/47 91/61  Pulse:   80 78  Resp:   20 20  Temp:   97.6 F (36.4 C) 98 F (36.7 C)  TempSrc:   Oral Oral  SpO2: 94% 94% 93% 94%  Weight:      Height:        Wt  Readings from Last 3 Encounters:  03/20/23 57.8 kg  01/31/23 65.6 kg  10/15/22 67.1 kg     Intake/Output Summary (Last 24 hours) at 03/23/2023 0745 Last data filed at 03/22/2023 2336 Gross per 24 hour  Intake --  Output 650 ml  Net -650 ml     Physical Exam  Awake but mildly confused, no new F.N deficits, Normal affect [place].AT,PERRAL Supple Neck, No JVD,   Symmetrical Chest wall movement, Good air movement bilaterally, CTAB RRR,No Gallops,Rubs or new Murmurs,  +ve B.Sounds, Abd Soft, No tenderness,   No Cyanosis, Clubbing or edema       Data Review:    Recent Labs  Lab 03/20/23 1442 03/20/23 1456 03/21/23 0430 03/22/23 0030 03/22/23 0434 03/23/23 0425  WBC 19.6*  --  12.8*  --  7.9 8.6  HGB 14.0 14.6 9.5* 8.1* 8.0* 8.3*  HCT 42.2 43.0 29.7* 24.5* 25.5* 26.4*  PLT 360  --  249  --  196 204  MCV 78.3*  --  79.8*  --  81.0 80.2  MCH 26.0  --  25.5*  --  25.4* 25.2*  MCHC 33.2  --  32.0  --  31.4 31.4  RDW 17.5*  --  17.2*  --  17.8* 17.6*  LYMPHSABS 1.5  --   --   --  1.8 0.9  MONOABS 1.6*  --   --   --  0.6 0.6  EOSABS 0.0  --   --   --  0.3 0.1  BASOSABS 0.1  --   --   --  0.0 0.0    Recent Labs  Lab 03/20/23 1442 03/20/23 1445 03/20/23 1456 03/20/23 1749 03/20/23 2304 03/21/23 0151 03/21/23 0219 03/21/23 0225 03/21/23 0430 03/22/23 0434 03/23/23 0425  NA 140  --  138  --   --   --  138  --   --  133* 134*  K 3.0*  --  2.9*  --   --   --  2.8*  --   --  3.4* 3.7  CL 91*  --  94*  --   --   --  101  --   --  101 105  CO2 29  --   --   --   --   --  27  --   --  24 26  ANIONGAP 20*  --   --   --   --   --  10  --   --  8 3*  GLUCOSE 128*  --  128*  --   --   --  96  --   --  83 101*  BUN 68*  --  58*  --   --   --  48*  --   --  25* 15  CREATININE 1.93*  --  2.00*  --   --   --  1.24*  --   --  0.81 0.85  AST 26  --   --   --   --   --  18  --   --   --   --  ALT 24  --   --   --   --   --  14  --   --   --   --   ALKPHOS 59  --   --   --   --   --   37*  --   --   --   --   BILITOT 1.1  --   --   --   --   --  1.0  --   --   --   --   ALBUMIN 3.2*  --   --   --   --   --  2.0*  --   --   --   --   CRP  --  14.5*  --   --   --   --   --   --   --  6.1* 3.4*  PROCALCITON  --   --   --   --   --  0.10  --   --   --  <0.10 <0.10  LATICACIDVEN  --   --  3.7* 3.3* 1.1  --   --  1.1  --   --   --   INR 1.0  --   --   --   --   --   --   --   --   --   --   TSH  --   --   --   --  0.022*  --   --   --   --  0.018*  --   HGBA1C  --   --   --   --  5.3  --   --   --   --   --   --   BNP  --   --   --   --   --   --   --   --   --  409.4* 725.0*  MG 2.2  --   --   --   --   --   --   --  1.7 1.9 1.9  CALCIUM 10.3  --   --   --   --   --  8.2*  --   --  7.6* 7.6*      Recent Labs  Lab 03/20/23 1442 03/20/23 1445 03/20/23 1456 03/20/23 1749 03/20/23 2304 03/21/23 0151 03/21/23 0219 03/21/23 0225 03/21/23 0430 03/22/23 0434 03/23/23 0425  CRP  --  14.5*  --   --   --   --   --   --   --  6.1* 3.4*  PROCALCITON  --   --   --   --   --  0.10  --   --   --  <0.10 <0.10  LATICACIDVEN  --   --  3.7* 3.3* 1.1  --   --  1.1  --   --   --   INR 1.0  --   --   --   --   --   --   --   --   --   --   TSH  --   --   --   --  0.022*  --   --   --   --  0.018*  --   HGBA1C  --   --   --   --  5.3  --   --   --   --   --   --  BNP  --   --   --   --   --   --   --   --   --  409.4* 725.0*  MG 2.2  --   --   --   --   --   --   --  1.7 1.9 1.9  CALCIUM 10.3  --   --   --   --   --  8.2*  --   --  7.6* 7.6*     Recent Labs    03/22/23 0434  TSH 0.018*  FREET4 1.13*   Radiology Reports DG Chest Port 1 View  Result Date: 03/22/2023 CLINICAL DATA:  Short of breath. EXAM: PORTABLE CHEST 1 VIEW COMPARISON:  03/20/2023 and older studies. FINDINGS: Cardiac silhouette is normal in size. No mediastinal or hilar masses. Stable changes from previous right lung surgery. Prominent interstitial markings noted bilaterally. No lung consolidation or  convincing pulmonary edema. No pleural effusion and no pneumothorax. Skeletal structures are grossly intact. IMPRESSION: 1. No acute cardiopulmonary disease. Electronically Signed   By: Amie Portland M.D.   On: 03/22/2023 08:26   CT CHEST ABDOMEN PELVIS WO CONTRAST  Result Date: 03/20/2023 CLINICAL DATA:  Sepsis No contrast. PT BIB [facility] after called to home by neighbors who had not seen or heard from her x3 days. PT was found on her floor, conscious but confused. Hx of cancer. EXAM: CT CHEST, ABDOMEN AND PELVIS WITHOUT CONTRAST TECHNIQUE: Multidetector CT imaging of the chest, abdomen and pelvis was performed following the standard protocol without IV contrast. RADIATION DOSE REDUCTION: This exam was performed according to the departmental dose-optimization program which includes automated exposure control, adjustment of the mA and/or kV according to patient size and/or use of iterative reconstruction technique. COMPARISON:  CT chest 09/01/2022, CT abdomen pelvis 04/14/2011 FINDINGS: CHEST: Cardiovascular: The thoracic aorta is normal in caliber. The heart is normal in size. No significant pericardial effusion. Moderate atherosclerotic plaque. Lungs/Pleura: Status post right upper lobectomy. Centrilobular emphysematous changes. Bilateral patchy ground-glass airspace. No pulmonary nodule. No pulmonary mass. No pulmonary contusion or laceration. No pneumatocele formation. No pleural effusion. No pneumothorax. No hemothorax. Mediastinum/Nodes: No pneumomediastinum. The central airways are patent. The esophagus is unremarkable.  Small hiatal hernia. The thyroid is unremarkable. Limited evaluation for hilar lymphadenopathy on this noncontrast study. No mediastinal or axillary lymphadenopathy. Musculoskeletal/Chest wall No chest wall mass. No acute rib or sternal fracture. No acute spinal fracture. Similar-appearing concavity of the superior endplate of the T4 vertebral body. ABDOMEN / PELVIS: Hepatobiliary: Not  enlarged. Vague hypodensity along the falciform ligament likely focal fatty infiltration. Subcentimeter hypodensities within the liver are chronic and likely representing simple hepatic cysts. The gallbladder is otherwise unremarkable with no radio-opaque gallstones. No biliary ductal dilatation. Pancreas: Normal pancreatic contour. No main pancreatic duct dilatation. Spleen: Not enlarged. No focal lesion. Adrenals/Urinary Tract: No nodularity bilaterally. No hydroureteronephrosis. No nephroureterolithiasis. No contour deforming renal mass. Air-fluid level within the urinary bladder lumen. Otherwise the urinary bladder is unremarkable. Stomach/Bowel: Right hemi colectomy. No small or large bowel wall thickening or dilatation. Colonic diverticulosis. The appendix is unremarkable. Vasculature/Lymphatic: Severe atherosclerotic plaque. No abdominal aorta or iliac aneurysm. No abdominal, pelvic, inguinal lymphadenopathy. Reproductive: Uterus and bilateral adnexal regions are unremarkable. Other: No simple free fluid ascites. No pneumoperitoneum. No mesenteric hematoma identified. No organized fluid collection. Musculoskeletal: No significant soft tissue hematoma. No acute pelvic fracture. No spinal fracture. Ports and Devices: None. IMPRESSION: 1. Bilateral patchy ground-glass airspace. Findings suggestive of  infection/inflammation. 2. Air-fluid level within the urinary bladder lumen-correlate for recent instrumentation. If no such finding, recommend urinalysis for evaluation of possible infection. 3. No acute intrathoracic, intra-abdominal, intrapelvic traumatic injury with limited evaluation due to noncontrast study. 4. No acute fracture or traumatic malalignment of the thoracic or lumbar spine. Other imaging findings of potential clinical significance: 1. Aortic Atherosclerosis (ICD10-I70.0) and Emphysema (ICD10-J43.9). 2. Colonic diverticulosis with no acute diverticulitis. 3. Small hiatal hernia. 4. Status post  right upper lobectomy and right hemicolectomy. Electronically Signed   By: Tish Frederickson M.D.   On: 03/20/2023 17:20   CT Head Wo Contrast  Result Date: 03/20/2023 CLINICAL DATA:  Head trauma, minor (Age >= 65y); Neck trauma (Age >= 65y) home by neighbors who had not seen or heard from her x3 days. PT was found on her floor, conscious but confused. Hx of cancer. ST on monitor, Hr 130 en route, T 97.7 EXAM: CT HEAD WITHOUT CONTRAST CT CERVICAL SPINE WITHOUT CONTRAST TECHNIQUE: Multidetector CT imaging of the head and cervical spine was performed following the standard protocol without intravenous contrast. Multiplanar CT image reconstructions of the cervical spine were also generated. RADIATION DOSE REDUCTION: This exam was performed according to the departmental dose-optimization program which includes automated exposure control, adjustment of the mA and/or kV according to patient size and/or use of iterative reconstruction technique. COMPARISON:  CT head and C-spine 10/15/2022 FINDINGS: CT HEAD FINDINGS Brain: Patchy and confluent areas of decreased attenuation are noted throughout the deep and periventricular white matter of the cerebral hemispheres bilaterally, compatible with chronic microvascular ischemic disease. No evidence of large-territorial acute infarction. No parenchymal hemorrhage. No mass lesion. No extra-axial collection. No mass effect or midline shift. No hydrocephalus. Basilar cisterns are patent. Vascular: No hyperdense vessel. Atherosclerotic calcifications are present within the cavernous internal carotid arteries. Skull: No acute fracture or focal lesion. Sinuses/Orbits: Paranasal sinuses and mastoid air cells are clear. The orbits are unremarkable. Other: None. CT CERVICAL SPINE FINDINGS Alignment: Normal. Skull base and vertebrae: At least moderate degenerative change of the spine. Ligamentum flavum calcification. Osseous fusion of the C5 - C7 levels. Associated moderate severe osseous  neural foraminal stenosis at the right C3-C4 levels. Associated severe osseous neural foraminal stenosis at the left C3-C4 and C4-C5 levels. No severe osseous central canal stenosis. No acute fracture with slightly limited evaluation due to motion artifact. No aggressive appearing focal osseous lesion or focal pathologic process. Soft tissues and spinal canal: No prevertebral fluid or swelling. No visible canal hematoma. Upper chest: Centrilobular emphysematous changes. Other: None. IMPRESSION: 1. No acute intracranial abnormality. 2. No acute displaced fracture or traumatic listhesis of the cervical spine. Slightly limited evaluation due to motion artifact. 3. 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 4.  Emphysema (ICD10-J43.9). Electronically Signed   By: Tish Frederickson M.D.   On: 03/20/2023 17:11   CT Cervical Spine Wo Contrast  Result Date: 03/20/2023 CLINICAL DATA:  Head trauma, minor (Age >= 65y); Neck trauma (Age >= 65y) home by neighbors who had not seen or heard from her x3 days. PT was found on her floor, conscious but confused. Hx of cancer. ST on monitor, Hr 130 en route, T 97.7 EXAM: CT HEAD WITHOUT CONTRAST CT CERVICAL SPINE WITHOUT CONTRAST TECHNIQUE: Multidetector CT imaging of the head and cervical spine was performed following the standard protocol without intravenous contrast. Multiplanar CT image reconstructions of the cervical spine were also generated. RADIATION DOSE REDUCTION: This exam was performed according  to the departmental dose-optimization program which includes automated exposure control, adjustment of the mA and/or kV according to patient size and/or use of iterative reconstruction technique. COMPARISON:  CT head and C-spine 10/15/2022 FINDINGS: CT HEAD FINDINGS Brain: Patchy and confluent areas of decreased attenuation are noted throughout the deep and periventricular white matter of the cerebral hemispheres bilaterally, compatible with  chronic microvascular ischemic disease. No evidence of large-territorial acute infarction. No parenchymal hemorrhage. No mass lesion. No extra-axial collection. No mass effect or midline shift. No hydrocephalus. Basilar cisterns are patent. Vascular: No hyperdense vessel. Atherosclerotic calcifications are present within the cavernous internal carotid arteries. Skull: No acute fracture or focal lesion. Sinuses/Orbits: Paranasal sinuses and mastoid air cells are clear. The orbits are unremarkable. Other: None. CT CERVICAL SPINE FINDINGS Alignment: Normal. Skull base and vertebrae: At least moderate degenerative change of the spine. Ligamentum flavum calcification. Osseous fusion of the C5 - C7 levels. Associated moderate severe osseous neural foraminal stenosis at the right C3-C4 levels. Associated severe osseous neural foraminal stenosis at the left C3-C4 and C4-C5 levels. No severe osseous central canal stenosis. No acute fracture with slightly limited evaluation due to motion artifact. No aggressive appearing focal osseous lesion or focal pathologic process. Soft tissues and spinal canal: No prevertebral fluid or swelling. No visible canal hematoma. Upper chest: Centrilobular emphysematous changes. Other: None. IMPRESSION: 1. No acute intracranial abnormality. 2. No acute displaced fracture or traumatic listhesis of the cervical spine. Slightly limited evaluation due to motion artifact. 3. 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 4.  Emphysema (ICD10-J43.9). Electronically Signed   By: Tish Frederickson M.D.   On: 03/20/2023 17:11   DG Chest Port 1 View  Result Date: 03/20/2023 CLINICAL DATA:  Questionable sepsis EXAM: PORTABLE CHEST 1 VIEW COMPARISON:  Chest x-ray 10/15/2022 FINDINGS: Postsurgical changes in the medial right upper lobe appear stable from prior. Right apical pleural thickening is unchanged. There is no new focal lung infiltrate, pleural effusion or  pneumothorax. The cardiac silhouette is within normal limits. IMPRESSION: 1. No active disease. 2. Stable postsurgical changes in the medial right upper lobe. Electronically Signed   By: Darliss Cheney M.D.   On: 03/20/2023 15:35      Signature  -   Susa Raring M.D on 03/23/2023 at 7:45 AM   -  To page go to [email or web address]

## 2023-03-23 NOTE — Care Management Important Message (Signed)
Important Message  Patient Details  Name: Andrea Morgan MRN: [id number] Date of Birth: 09/20/1950   Important Message Given:  Yes - Medicare IM  Patient left  prior to IM delivery will mail a copy to the patient home address.    Lashaunta Sicard 03/23/2023, 2:54 PM

## 2023-03-23 NOTE — Progress Notes (Signed)
TRH night cross cover note:   I was notified by RN that the patient's phosphorus level is low this morning at 1.0.  While she will require additional phosphorus supplementation beyond this, I have placed a one-time order for K-Phos Neutral 500 mg p.o. x 1 dose now.    Newton Pigg, DO Hospitalist

## 2023-03-23 NOTE — Plan of Care (Signed)
  Problem: Health Behavior/Discharge Planning: Goal: Ability to manage health-related needs will improve Outcome: Progressing   Problem: Clinical Measurements: Goal: Will remain free from infection Outcome: Progressing   Problem: Coping: Goal: Level of anxiety will decrease Outcome: Progressing   Problem: Safety: Goal: Ability to remain free from injury will improve Outcome: Progressing   

## 2023-03-23 NOTE — TOC Progression Note (Addendum)
Transition of Care [facility]) - Progression Note    Patient Details  Name: Andrea Morgan MRN: [id number] Date of Birth: 1950-09-19  Transition of Care [facility]) CM/SW Contact  Mearl Latin, LCSW Phone Number: 03/23/2023, 1:28 PM  Clinical Narrative:    Patient remains disoriented. CSW faxed SNF bed offers to patient's sister as requested. CSW made [name] aware and she will review.     Expected Discharge Plan: Skilled Nursing Facility Barriers to Discharge: Continued Medical Work up, English as a second language teacher, SNF Authorization Denied  Expected Discharge Plan and Services In-house Referral: Clinical Social Work   Post Acute Care Choice: Skilled Nursing Facility Living arrangements for the past 2 months: Apartment                                       Social Determinants of Health (SDOH) Interventions SDOH Screenings   Food Insecurity: No Food Insecurity (03/07/2022)  Housing: Low Risk  (03/07/2022)  Transportation Needs: No Transportation Needs (03/07/2022)  Utilities: Not At Risk (03/07/2022)  Tobacco Use: Medium Risk (03/20/2023)    Readmission Risk Interventions     No data to display

## 2023-03-24 DIAGNOSIS — A419 Sepsis, unspecified organism: Secondary | ICD-10-CM | POA: Diagnosis not present

## 2023-03-24 DIAGNOSIS — N179 Acute kidney failure, unspecified: Secondary | ICD-10-CM | POA: Diagnosis not present

## 2023-03-24 DIAGNOSIS — R6520 Severe sepsis without septic shock: Secondary | ICD-10-CM | POA: Diagnosis not present

## 2023-03-24 LAB — CBC WITH DIFFERENTIAL/PLATELET
Abs Immature Granulocytes: 0.54 10*3/uL — ABNORMAL HIGH (ref 0.00–0.07)
Basophils Absolute: 0 10*3/uL (ref 0.0–0.1)
Basophils Relative: 0 %
Eosinophils Absolute: 0.2 10*3/uL (ref 0.0–0.5)
Eosinophils Relative: 2 %
HCT: 27.4 % — ABNORMAL LOW (ref 36.0–46.0)
Hemoglobin: 9 g/dL — ABNORMAL LOW (ref 12.0–15.0)
Immature Granulocytes: 6 %
Lymphocytes Relative: 12 %
Lymphs Abs: 1.1 10*3/uL (ref 0.7–4.0)
MCH: 25.8 pg — ABNORMAL LOW (ref 26.0–34.0)
MCHC: 32.8 g/dL (ref 30.0–36.0)
MCV: 78.5 fL — ABNORMAL LOW (ref 80.0–100.0)
Monocytes Absolute: 0.6 10*3/uL (ref 0.1–1.0)
Monocytes Relative: 6 %
Neutro Abs: 6.6 10*3/uL (ref 1.7–7.7)
Neutrophils Relative %: 74 %
Platelets: 223 10*3/uL (ref 150–400)
RBC: 3.49 MIL/uL — ABNORMAL LOW (ref 3.87–5.11)
RDW: 17.7 % — ABNORMAL HIGH (ref 11.5–15.5)
WBC: 9 10*3/uL (ref 4.0–10.5)
nRBC: 0 % (ref 0.0–0.2)

## 2023-03-24 LAB — CBC
HCT: 27.2 % — ABNORMAL LOW (ref 36.0–46.0)
Hemoglobin: 8.8 g/dL — ABNORMAL LOW (ref 12.0–15.0)
MCH: 26.1 pg (ref 26.0–34.0)
MCHC: 32.4 g/dL (ref 30.0–36.0)
MCV: 80.7 fL (ref 80.0–100.0)
Platelets: 220 10*3/uL (ref 150–400)
RBC: 3.37 MIL/uL — ABNORMAL LOW (ref 3.87–5.11)
RDW: 18 % — ABNORMAL HIGH (ref 11.5–15.5)
WBC: 8.1 10*3/uL (ref 4.0–10.5)
nRBC: 0 % (ref 0.0–0.2)

## 2023-03-24 LAB — BASIC METABOLIC PANEL
Anion gap: 12 (ref 5–15)
Anion gap: 10 (ref 5–15)
BUN: 9 mg/dL (ref 8–23)
BUN: 8 mg/dL (ref 8–23)
CO2: 24 mmol/L (ref 22–32)
CO2: 22 mmol/L (ref 22–32)
Calcium: 7.6 mg/dL — ABNORMAL LOW (ref 8.9–10.3)
Calcium: 7.7 mg/dL — ABNORMAL LOW (ref 8.9–10.3)
Chloride: 104 mmol/L (ref 98–111)
Chloride: 105 mmol/L (ref 98–111)
Creatinine, Ser: 0.78 mg/dL (ref 0.44–1.00)
Creatinine, Ser: 0.85 mg/dL (ref 0.44–1.00)
GFR, Estimated: >60 mL/min (ref >60)
GFR, Estimated: >60 mL/min (ref >60)
Glucose, Bld: 83 mg/dL (ref 70–99)
Glucose, Bld: 125 mg/dL — ABNORMAL HIGH (ref 70–99)
Potassium: 3 mmol/L — ABNORMAL LOW (ref 3.5–5.1)
Potassium: 3.6 mmol/L (ref 3.5–5.1)
Sodium: 140 mmol/L (ref 135–145)
Sodium: 137 mmol/L (ref 135–145)

## 2023-03-24 LAB — MAGNESIUM: Magnesium: 1.6 mg/dL — ABNORMAL LOW (ref 1.7–2.4)

## 2023-03-24 LAB — BRAIN NATRIURETIC PEPTIDE: B Natriuretic Peptide: 609.6 pg/mL — ABNORMAL HIGH (ref 0.0–100.0)

## 2023-03-24 LAB — C-REACTIVE PROTEIN: CRP: 5.5 mg/dL — ABNORMAL HIGH (ref <1.0)

## 2023-03-24 LAB — PHOSPHORUS: Phosphorus: 4.9 mg/dL — ABNORMAL HIGH (ref 2.5–4.6)

## 2023-03-24 LAB — PROCALCITONIN: Procalcitonin: <0.1 ng/mL

## 2023-03-24 MED ORDER — POTASSIUM CHLORIDE CRYS ER 20 MEQ PO TBCR
40.0000 meq | EXTENDED_RELEASE_TABLET | Freq: Two times a day (BID) | ORAL | Status: AC
  Administered 2023-03-24 (×2): 40 meq via ORAL
  Filled 2023-03-24 (×2): qty 2

## 2023-03-24 MED ORDER — MAGNESIUM SULFATE 4 GM/100ML IV SOLN
4.0000 g | Freq: Once | INTRAVENOUS | Status: AC
  Administered 2023-03-24: 4 g via INTRAVENOUS
  Filled 2023-03-24: qty 100

## 2023-03-24 MED ORDER — LACTATED RINGERS IV SOLN
INTRAVENOUS | Status: AC
Start: 2023-03-24 — End: 2023-03-25

## 2023-03-24 MED ORDER — METOPROLOL TARTRATE 5 MG/5ML IV SOLN: 2.5000 mg | INTRAVENOUS | Status: DC | PRN

## 2023-03-24 MED ORDER — ALBUMIN HUMAN 25 % IV SOLN
25.0000 g | Freq: Once | INTRAVENOUS | Status: AC
  Administered 2023-03-24: 25 g via INTRAVENOUS
  Filled 2023-03-24: qty 100

## 2023-03-24 MED ORDER — ALBUMIN HUMAN 25 % IV SOLN: 50.0000 g | Freq: Once | INTRAVENOUS | Status: DC

## 2023-03-24 MED ORDER — SODIUM CHLORIDE 0.9 % IV BOLUS
500.0000 mL | INTRAVENOUS | Status: AC
  Administered 2023-03-24: 500 mL via INTRAVENOUS

## 2023-03-24 NOTE — Plan of Care (Signed)
  Problem: Education: Goal: Knowledge of General Education information will improve Description: Including pain rating scale, medication(s)/side effects and non-pharmacologic comfort measures Outcome: Progressing   Problem: Health Behavior/Discharge Planning: Goal: Ability to manage health-related needs will improve Outcome: Progressing   Problem: Clinical Measurements: Goal: Ability to maintain clinical measurements within normal limits will improve Outcome: Progressing Goal: Will remain free from infection Outcome: Progressing Goal: Diagnostic test results will improve Outcome: Progressing Goal: Respiratory complications will improve Outcome: Progressing Goal: Cardiovascular complication will be avoided Outcome: Progressing   Problem: Activity: Goal: Risk for activity intolerance will decrease Outcome: Progressing   Problem: Nutrition: Goal: Adequate nutrition will be maintained Outcome: Progressing   Problem: Coping: Goal: Level of anxiety will decrease Outcome: Progressing   Problem: Elimination: Goal: Will not experience complications related to bowel motility Outcome: Progressing Goal: Will not experience complications related to urinary retention Outcome: Progressing   Problem: Pain Management: Goal: General experience of comfort will improve Outcome: Progressing   Problem: Safety: Goal: Ability to remain free from injury will improve Outcome: Progressing   Problem: Skin Integrity: Goal: Risk for impaired skin integrity will decrease Outcome: Progressing   Problem: Fluid Volume: Goal: Hemodynamic stability will improve Outcome: Progressing   Problem: Clinical Measurements: Goal: Diagnostic test results will improve Outcome: Progressing Goal: Signs and symptoms of infection will decrease Outcome: Progressing   Problem: Respiratory: Goal: Ability to maintain adequate ventilation will improve Outcome: Progressing   Problem: Safety: Goal:  Non-violent Restraint(s) Outcome: Progressing

## 2023-03-24 NOTE — Progress Notes (Signed)
PROGRESS NOTE                                                                                                                                                                                                             Patient Demographics:    Andrea Morgan, is a 72 y.o. female, DOB - Jul 06, 1950, ZOX:[id number]  Outpatient Primary MD for the patient is Alysia Penna, MD    LOS - 4  Admit date - 03/20/2023    Chief Complaint  Patient presents with   Fall   Weakness       Brief Narrative (HPI from H&P)    72 y.o. female with medical history significant of emphysema, lung cancer, GERD, hyperlipidemia, paroxysmal A-fib, anxiety, and depression who was brought in by EMS after being found down at her home by her neighbors.  Neighbor Louis reports he and his wife found the patient on the floor at the foot of her bed.  They last saw her about 36 hours before.  Just before Thanksgiving she had been experiencing some nausea and dehydration.  The neighbors brought her some Pedialyte and crackers to eat.  Her doctor called in her some nausea medications.    She was brought to the ER where she was diagnosed with sepsis due to combination of UTI and pneumonia and admitted to the hospital.   Subjective:   Patient in bed, appears comfortable, denies any headache, no fever, no chest pain or pressure, no shortness of breath , no abdominal pain. No focal weakness.   Assessment  & Plan :   Sepsis present on admission due to combination of UTI and community-acquired pneumonia.  Being treated with IV antibiotics and IV fluids, sepsis pathophysiology much improved, CT head, C-spine, chest abdomen pelvis noted.  Continue empiric IV antibiotics, clinically improved, continue gentle hydration with IV fluids, continue supportive care clinically improving.  Follow cultures, preliminary urine cultures growing 40,000 colonies of  Klebsiella.  Dehydration, AKI, hypokalemia, hyponatremia- replace electrolytes, hydrate with IV fluids, AKI improving.  Continue to monitor.  Continue gentle hydration on 03/23/2023.  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  History of underlying COPD.  Supportive care.  Home nebulizer treatments continued, I-S flutter valve added, as needed oxygen as needed.  Paroxysmal atrial fibrillation.  [place] vas 2 score of greater than  3.  Not on anticoagulation at baseline.  Low-dose Cardizem initiated at lower than home dose as blood pressure is soft, home aspirin resumed.  Defer anticoagulation to PCP and primary cardiologist.  Anxiety, depression.  Low-dose benzodiazepine with caution.  Home venlafaxine and bupropion.  Hypokalemia, hypomagnesemia, hypophosphatemia.  Replace.    Cognitive decline versus early dementia.  Home Aricept.  Risk for delirium.  Minimize narcotics and benzodiazepines.  Hypothyroidism.  TSH suppressed, could be sick euthyroid syndrome, for now hold Synthroid, will reintroduce Synthroid at 4 than home dose upon discharge, repeat TSH and free T4 in 2 to 3 weeks by PCP.  Metabolic encephalopathy, nighttime hospital-acquired delirium.  Minimize narcotics and benzodiazepines, twice daily Seroquel and as needed Haldol.      Condition - Extremely Guarded  Family Communication  :    Called friend Jeanice Lim [phone] at 03/21/2023 at 7:45 AM.  No response.     Left message for friend Eunice Blase [phone] on 03/21/2023 at 7:50 AM  Code Status :  Full  Consults  :  None  PUD Prophylaxis : PPI   Procedures  :     CT chest, abdomen and pelvis.     1. Bilateral patchy ground-glass airspace. Findings suggestive of infection/inflammation. 2. Air-fluid level within the urinary bladder lumen-correlate for recent instrumentation. If no such finding, recommend urinalysis for evaluation of possible infection. 3. No acute intrathoracic, intra-abdominal,  intrapelvic traumatic injury with limited evaluation due to noncontrast study. 4. No acute fracture or traumatic malalignment of the thoracic or lumbar spine.   Other imaging findings of potential clinical significance:   1. Aortic Atherosclerosis (ICD10-I70.0) and Emphysema (ICD10-J43.9). 2. Colonic diverticulosis with no acute diverticulitis. 3. Small hiatal hernia. 4. Status post right upper lobectomy and right hemicolectomy.  CT Head and C Spine - Non acute      Disposition Plan  :    Status is: Inpatient  DVT Prophylaxis  :    enoxaparin (LOVENOX) injection 40 mg Start: 03/22/23 1000    Lab Results  Component Value Date   PLT 223 03/24/2023    Diet :  Diet Order             Diet regular Room service appropriate? Yes; Fluid consistency: Thin  Diet effective now                    Inpatient Medications  Scheduled Meds:  ALPRAZolam  0.5 mg Oral BID   amoxicillin-clavulanate  1 tablet Oral Q12H   arformoterol  15 mcg Nebulization BID   And   umeclidinium bromide  1 puff Inhalation Daily   aspirin EC  81 mg Oral Daily   atorvastatin  20 mg Oral QHS   buPROPion  150 mg Oral Daily   diltiazem  60 mg Oral Q12H   donepezil  5 mg Oral QHS   enoxaparin (LOVENOX) injection  40 mg Subcutaneous Daily   feeding supplement  237 mL Oral TID BM   ferrous sulfate  325 mg Oral BID WC   folic acid  1 mg Oral Daily   mometasone-formoterol  2 puff Inhalation BID   oxidized cellulose  1 each Topical Once   pantoprazole  40 mg Oral Daily   potassium chloride  40 mEq Oral BID   QUEtiapine  25 mg Oral BID   venlafaxine XR  37.5 mg Oral Q breakfast   Continuous Infusions:  magnesium sulfate bolus IVPB     PRN Meds:.acetaminophen **OR** acetaminophen, albuterol,  melatonin, ondansetron **OR** ondansetron (ZOFRAN) IV    Objective:   Vitals:   03/24/23 0348 03/24/23 0410 03/24/23 0750 03/24/23 0816  BP: (!) 96/42 (!) 101/55  (!) 124/50  Pulse: 83  79 86  Resp: 18  17  20   Temp: 98.1 F (36.7 C)   98 F (36.7 C)  TempSrc: Oral   Axillary  SpO2: 95%  98% 96%  Weight:      Height:        Wt Readings from Last 3 Encounters:  03/20/23 57.8 kg  01/31/23 65.6 kg  10/15/22 67.1 kg     Intake/Output Summary (Last 24 hours) at 03/24/2023 1046 Last data filed at 03/24/2023 0500 Gross per 24 hour  Intake 616.21 ml  Output 200 ml  Net 416.21 ml     Physical Exam  Awake but mildly confused, no new F.N deficits, Normal affect [place].AT,PERRAL Supple Neck, No JVD,   Symmetrical Chest wall movement, Good air movement bilaterally, CTAB RRR,No Gallops,Rubs or new Murmurs,  +ve B.Sounds, Abd Soft, No tenderness,   No Cyanosis, Clubbing or edema       Data Review:    Recent Labs  Lab 03/20/23 1442 03/20/23 1456 03/21/23 0430 03/22/23 0030 03/22/23 0434 03/23/23 0425 03/24/23 0507  WBC 19.6*  --  12.8*  --  7.9 8.6 9.0  HGB 14.0   < > 9.5* 8.1* 8.0* 8.3* 9.0*  HCT 42.2   < > 29.7* 24.5* 25.5* 26.4* 27.4*  PLT 360  --  249  --  196 204 223  MCV 78.3*  --  79.8*  --  81.0 80.2 78.5*  MCH 26.0  --  25.5*  --  25.4* 25.2* 25.8*  MCHC 33.2  --  32.0  --  31.4 31.4 32.8  RDW 17.5*  --  17.2*  --  17.8* 17.6* 17.7*  LYMPHSABS 1.5  --   --   --  1.8 0.9 1.1  MONOABS 1.6*  --   --   --  0.6 0.6 0.6  EOSABS 0.0  --   --   --  0.3 0.1 0.2  BASOSABS 0.1  --   --   --  0.0 0.0 0.0   < > = values in this interval not displayed.    Recent Labs  Lab 03/20/23 1442 03/20/23 1445 03/20/23 1456 03/20/23 1749 03/20/23 2304 03/21/23 0151 03/21/23 0219 03/21/23 0225 03/21/23 0430 03/22/23 0434 03/23/23 0425 03/23/23 0502 03/24/23 0507  NA 140  --  138  --   --   --  138  --   --  133* 134*  --  140  K 3.0*  --  2.9*  --   --   --  2.8*  --   --  3.4* 3.7  --  3.0*  CL 91*  --  94*  --   --   --  101  --   --  101 105  --  104  CO2 29  --   --   --   --   --  27  --   --  24 26  --  24  ANIONGAP 20*  --   --   --   --   --  10  --   --  8 3*  --  12   GLUCOSE 128*  --  128*  --   --   --  96  --   --  83 101*  --  83  BUN 68*  --  58*  --   --   --  48*  --   --  25* 15  --  9  CREATININE 1.93*  --  2.00*  --   --   --  1.24*  --   --  0.81 0.85  --  0.78  AST 26  --   --   --   --   --  18  --   --   --   --   --   --   ALT 24  --   --   --   --   --  14  --   --   --   --   --   --   ALKPHOS 59  --   --   --   --   --  37*  --   --   --   --   --   --   BILITOT 1.1  --   --   --   --   --  1.0  --   --   --   --   --   --   ALBUMIN 3.2*  --   --   --   --   --  2.0*  --   --   --   --   --   --   CRP  --  14.5*  --   --   --   --   --   --   --  6.1* 3.4*  --  5.5*  PROCALCITON  --   --   --   --   --  0.10  --   --   --  <0.10 <0.10  --  <0.10  LATICACIDVEN  --   --  3.7* 3.3* 1.1  --   --  1.1  --   --   --   --   --   INR 1.0  --   --   --   --   --   --   --   --   --   --   --   --   TSH  --   --   --   --  0.022*  --   --   --   --  0.018*  --  0.058*  --   HGBA1C  --   --   --   --  5.3  --   --   --   --   --   --   --   --   BNP  --   --   --   --   --   --   --   --   --  409.4* 725.0*  --  609.6*  MG 2.2  --   --   --   --   --   --   --  1.7 1.9 1.9  --  1.6*  CALCIUM 10.3  --   --   --   --   --  8.2*  --   --  7.6* 7.6*  --  7.6*      Recent Labs  Lab 03/20/23 1442 03/20/23 1445 03/20/23 1456 03/20/23 1749 03/20/23 2304 03/21/23 0151 03/21/23 0219 03/21/23 0225 03/21/23 0430 03/22/23 0434 03/23/23 0425 03/23/23 0502 03/24/23 0507  CRP  --  14.5*  --   --   --   --   --   --   --  6.1* 3.4*  --  5.5*  PROCALCITON  --   --   --   --   --  0.10  --   --   --  <0.10 <0.10  --  <0.10  LATICACIDVEN  --   --  3.7* 3.3* 1.1  --   --  1.1  --   --   --   --   --   INR 1.0  --   --   --   --   --   --   --   --   --   --   --   --   TSH  --   --   --   --  0.022*  --   --   --   --  0.018*  --  0.058*  --   HGBA1C  --   --   --   --  5.3  --   --   --   --   --   --   --   --   BNP  --   --   --   --   --   --   --    --   --  409.4* 725.0*  --  609.6*  MG 2.2  --   --   --   --   --   --   --  1.7 1.9 1.9  --  1.6*  CALCIUM 10.3  --   --   --   --   --  8.2*  --   --  7.6* 7.6*  --  7.6*     Recent Labs    03/23/23 0502  TSH 0.058*  FREET4 0.89   Radiology Reports DG Chest Port 1 View  Result Date: 03/22/2023 CLINICAL DATA:  Short of breath. EXAM: PORTABLE CHEST 1 VIEW COMPARISON:  03/20/2023 and older studies. FINDINGS: Cardiac silhouette is normal in size. No mediastinal or hilar masses. Stable changes from previous right lung surgery. Prominent interstitial markings noted bilaterally. No lung consolidation or convincing pulmonary edema. No pleural effusion and no pneumothorax. Skeletal structures are grossly intact. IMPRESSION: 1. No acute cardiopulmonary disease. Electronically Signed   By: Amie Portland M.D.   On: 03/22/2023 08:26   CT CHEST ABDOMEN PELVIS WO CONTRAST  Result Date: 03/20/2023 CLINICAL DATA:  Sepsis No contrast. PT BIB [facility] after called to home by neighbors who had not seen or heard from her x3 days. PT was found on her floor, conscious but confused. Hx of cancer. EXAM: CT CHEST, ABDOMEN AND PELVIS WITHOUT CONTRAST TECHNIQUE: Multidetector CT imaging of the chest, abdomen and pelvis was performed following the standard protocol without IV contrast. RADIATION DOSE REDUCTION: This exam was performed according to the departmental dose-optimization program which includes automated exposure control, adjustment of the mA and/or kV according to patient size and/or use of iterative reconstruction technique. COMPARISON:  CT chest 09/01/2022, CT abdomen pelvis 04/14/2011 FINDINGS: CHEST: Cardiovascular: The thoracic aorta is normal in caliber. The heart is normal in size. No significant pericardial effusion. Moderate atherosclerotic plaque. Lungs/Pleura: Status post right upper lobectomy. Centrilobular emphysematous changes. Bilateral patchy ground-glass airspace. No pulmonary nodule. No pulmonary  mass. No pulmonary contusion or laceration. No pneumatocele formation. No pleural effusion. No pneumothorax. No hemothorax. Mediastinum/Nodes: No pneumomediastinum. The central airways are patent. The esophagus is unremarkable.  Small hiatal hernia. The thyroid is unremarkable. Limited evaluation for hilar lymphadenopathy on this noncontrast study. No mediastinal  or axillary lymphadenopathy. Musculoskeletal/Chest wall No chest wall mass. No acute rib or sternal fracture. No acute spinal fracture. Similar-appearing concavity of the superior endplate of the T4 vertebral body. ABDOMEN / PELVIS: Hepatobiliary: Not enlarged. Vague hypodensity along the falciform ligament likely focal fatty infiltration. Subcentimeter hypodensities within the liver are chronic and likely representing simple hepatic cysts. The gallbladder is otherwise unremarkable with no radio-opaque gallstones. No biliary ductal dilatation. Pancreas: Normal pancreatic contour. No main pancreatic duct dilatation. Spleen: Not enlarged. No focal lesion. Adrenals/Urinary Tract: No nodularity bilaterally. No hydroureteronephrosis. No nephroureterolithiasis. No contour deforming renal mass. Air-fluid level within the urinary bladder lumen. Otherwise the urinary bladder is unremarkable. Stomach/Bowel: Right hemi colectomy. No small or large bowel wall thickening or dilatation. Colonic diverticulosis. The appendix is unremarkable. Vasculature/Lymphatic: Severe atherosclerotic plaque. No abdominal aorta or iliac aneurysm. No abdominal, pelvic, inguinal lymphadenopathy. Reproductive: Uterus and bilateral adnexal regions are unremarkable. Other: No simple free fluid ascites. No pneumoperitoneum. No mesenteric hematoma identified. No organized fluid collection. Musculoskeletal: No significant soft tissue hematoma. No acute pelvic fracture. No spinal fracture. Ports and Devices: None. IMPRESSION: 1. Bilateral patchy ground-glass airspace. Findings suggestive of  infection/inflammation. 2. Air-fluid level within the urinary bladder lumen-correlate for recent instrumentation. If no such finding, recommend urinalysis for evaluation of possible infection. 3. No acute intrathoracic, intra-abdominal, intrapelvic traumatic injury with limited evaluation due to noncontrast study. 4. No acute fracture or traumatic malalignment of the thoracic or lumbar spine. Other imaging findings of potential clinical significance: 1. Aortic Atherosclerosis (ICD10-I70.0) and Emphysema (ICD10-J43.9). 2. Colonic diverticulosis with no acute diverticulitis. 3. Small hiatal hernia. 4. Status post right upper lobectomy and right hemicolectomy. Electronically Signed   By: Tish Frederickson M.D.   On: 03/20/2023 17:20   CT Head Wo Contrast  Result Date: 03/20/2023 CLINICAL DATA:  Head trauma, minor (Age >= 65y); Neck trauma (Age >= 65y) home by neighbors who had not seen or heard from her x3 days. PT was found on her floor, conscious but confused. Hx of cancer. ST on monitor, Hr 130 en route, T 97.7 EXAM: CT HEAD WITHOUT CONTRAST CT CERVICAL SPINE WITHOUT CONTRAST TECHNIQUE: Multidetector CT imaging of the head and cervical spine was performed following the standard protocol without intravenous contrast. Multiplanar CT image reconstructions of the cervical spine were also generated. RADIATION DOSE REDUCTION: This exam was performed according to the departmental dose-optimization program which includes automated exposure control, adjustment of the mA and/or kV according to patient size and/or use of iterative reconstruction technique. COMPARISON:  CT head and C-spine 10/15/2022 FINDINGS: CT HEAD FINDINGS Brain: Patchy and confluent areas of decreased attenuation are noted throughout the deep and periventricular white matter of the cerebral hemispheres bilaterally, compatible with chronic microvascular ischemic disease. No evidence of large-territorial acute infarction. No parenchymal hemorrhage. No mass  lesion. No extra-axial collection. No mass effect or midline shift. No hydrocephalus. Basilar cisterns are patent. Vascular: No hyperdense vessel. Atherosclerotic calcifications are present within the cavernous internal carotid arteries. Skull: No acute fracture or focal lesion. Sinuses/Orbits: Paranasal sinuses and mastoid air cells are clear. The orbits are unremarkable. Other: None. CT CERVICAL SPINE FINDINGS Alignment: Normal. Skull base and vertebrae: At least moderate degenerative change of the spine. Ligamentum flavum calcification. Osseous fusion of the C5 - C7 levels. Associated moderate severe osseous neural foraminal stenosis at the right C3-C4 levels. Associated severe osseous neural foraminal stenosis at the left C3-C4 and C4-C5 levels. No severe osseous central canal stenosis. No acute fracture with slightly limited evaluation  due to motion artifact. No aggressive appearing focal osseous lesion or focal pathologic process. Soft tissues and spinal canal: No prevertebral fluid or swelling. No visible canal hematoma. Upper chest: Centrilobular emphysematous changes. Other: None. IMPRESSION: 1. No acute intracranial abnormality. 2. No acute displaced fracture or traumatic listhesis of the cervical spine. Slightly limited evaluation due to motion artifact. 3. 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 4.  Emphysema (ICD10-J43.9). Electronically Signed   By: Tish Frederickson M.D.   On: 03/20/2023 17:11   CT Cervical Spine Wo Contrast  Result Date: 03/20/2023 CLINICAL DATA:  Head trauma, minor (Age >= 65y); Neck trauma (Age >= 65y) home by neighbors who had not seen or heard from her x3 days. PT was found on her floor, conscious but confused. Hx of cancer. ST on monitor, Hr 130 en route, T 97.7 EXAM: CT HEAD WITHOUT CONTRAST CT CERVICAL SPINE WITHOUT CONTRAST TECHNIQUE: Multidetector CT imaging of the head and cervical spine was performed following the standard protocol  without intravenous contrast. Multiplanar CT image reconstructions of the cervical spine were also generated. RADIATION DOSE REDUCTION: This exam was performed according to the departmental dose-optimization program which includes automated exposure control, adjustment of the mA and/or kV according to patient size and/or use of iterative reconstruction technique. COMPARISON:  CT head and C-spine 10/15/2022 FINDINGS: CT HEAD FINDINGS Brain: Patchy and confluent areas of decreased attenuation are noted throughout the deep and periventricular white matter of the cerebral hemispheres bilaterally, compatible with chronic microvascular ischemic disease. No evidence of large-territorial acute infarction. No parenchymal hemorrhage. No mass lesion. No extra-axial collection. No mass effect or midline shift. No hydrocephalus. Basilar cisterns are patent. Vascular: No hyperdense vessel. Atherosclerotic calcifications are present within the cavernous internal carotid arteries. Skull: No acute fracture or focal lesion. Sinuses/Orbits: Paranasal sinuses and mastoid air cells are clear. The orbits are unremarkable. Other: None. CT CERVICAL SPINE FINDINGS Alignment: Normal. Skull base and vertebrae: At least moderate degenerative change of the spine. Ligamentum flavum calcification. Osseous fusion of the C5 - C7 levels. Associated moderate severe osseous neural foraminal stenosis at the right C3-C4 levels. Associated severe osseous neural foraminal stenosis at the left C3-C4 and C4-C5 levels. No severe osseous central canal stenosis. No acute fracture with slightly limited evaluation due to motion artifact. No aggressive appearing focal osseous lesion or focal pathologic process. Soft tissues and spinal canal: No prevertebral fluid or swelling. No visible canal hematoma. Upper chest: Centrilobular emphysematous changes. Other: None. IMPRESSION: 1. No acute intracranial abnormality. 2. No acute displaced fracture or traumatic  listhesis of the cervical spine. Slightly limited evaluation due to motion artifact. 3. 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 4.  Emphysema (ICD10-J43.9). Electronically Signed   By: Tish Frederickson M.D.   On: 03/20/2023 17:11   DG Chest Port 1 View  Result Date: 03/20/2023 CLINICAL DATA:  Questionable sepsis EXAM: PORTABLE CHEST 1 VIEW COMPARISON:  Chest x-ray 10/15/2022 FINDINGS: Postsurgical changes in the medial right upper lobe appear stable from prior. Right apical pleural thickening is unchanged. There is no new focal lung infiltrate, pleural effusion or pneumothorax. The cardiac silhouette is within normal limits. IMPRESSION: 1. No active disease. 2. Stable postsurgical changes in the medial right upper lobe. Electronically Signed   By: Darliss Cheney M.D.   On: 03/20/2023 15:35      Signature  -   Susa Raring M.D on 03/24/2023 at 10:46 AM   -  To page go to [email or web address]

## 2023-03-24 NOTE — TOC Progression Note (Addendum)
Transition of Care [facility]) - Progression Note    Patient Details  Name: Andrea Morgan MRN: [id number] Date of Birth: 1951/03/09  [facility] CM/SW Contact  Mearl Latin, LCSW Phone Number: 03/24/2023, 11:57 AM  Clinical Narrative:    12:00 PM-CSW confirmed patient's sister, [name], is requesting [facility] in [place] for rehab. [facility] reported [facility] do have a bed available tomorrow if stable so CSW will begin insurance process once updated PT note is in today.   4:20 PM-CSW submitted for insurance approval for SNF and received it, Ref# [id number], effective 03/25/2023-03/27/2023. CSW updated [facility].     Expected Discharge Plan: Skilled Nursing Facility Barriers to Discharge: Continued Medical Work up, English as a second language teacher, SNF Pending bed offer  Expected Discharge Plan and Services In-house Referral: Clinical Social Work   Post Acute Care Choice: Skilled Nursing Facility Living arrangements for the past 2 months: Apartment                                       Social Determinants of Health (SDOH) Interventions SDOH Screenings   Food Insecurity: No Food Insecurity (03/07/2022)  Housing: Low Risk  (03/07/2022)  Transportation Needs: No Transportation Needs (03/07/2022)  Utilities: Not At Risk (03/07/2022)  Tobacco Use: Medium Risk (03/20/2023)    Readmission Risk Interventions     No data to display

## 2023-03-24 NOTE — Progress Notes (Signed)
Physical Therapy Treatment Patient Details Name: Andrea Morgan MRN: [id number] DOB: 1950-07-02 Today's Date: 03/24/2023   History of Present Illness 72 y.o. female brought in by EMS after being found down at her home. Sepsis present on admission due to combination of UTI and community-acquired pneumonia. PMH: emphysema, COPD, lung cancer, Status post right upper lobectomy and right hemicolectomy, GERD, hyperlipidemia, paroxysmal A-fib, anxiety, and depression.    PT Comments  Pt received in supine and agreeable to session. Pt able to sit to EOB with CGA and stand with mod A. Pt able to briefly stand with CGA and cues for upright posture. Pt requesting seated rest break and then to return to supine, stating "I can't do it all in one day". Pt intermittently confused requiring increased cues and redirection. Pt continues to benefit from PT services to progress toward functional mobility goals.     If plan is discharge home, recommend the following: A lot of help with walking and/or transfers;A little help with bathing/dressing/bathroom;Assistance with cooking/housework;Direct supervision/assist for medications management;Help with stairs or ramp for entrance;Assist for transportation;Supervision due to cognitive status   Can travel by private vehicle     No  Equipment Recommendations  Other (comment) (per accepting facility)    Recommendations for Other Services       Precautions / Restrictions Precautions Precautions: Fall Restrictions Weight Bearing Restrictions: No     Mobility  Bed Mobility Overal bed mobility: Needs Assistance Bed Mobility: Supine to Sit, Sit to Supine     Supine to sit: Contact guard, HOB elevated, Used rails Sit to supine: Min assist   General bed mobility comments: Pt able to sit to EOB with CGA, however requires assist to scoot forward. Min A for BLE elevation back to EOB    Transfers Overall transfer level: Needs assistance Equipment used: Rolling  walker (2 wheels) Transfers: Sit to/from Stand Sit to Stand: From elevated surface, Mod assist           General transfer comment: mod A for power up and cues for safe hand placement and anterior weight shift. Pt declines additional trials due to fatigue      Balance Overall balance assessment: Needs assistance Sitting-balance support: Feet supported, Bilateral upper extremity supported Sitting balance-Leahy Scale: Fair Sitting balance - Comments: sitting EOB   Standing balance support: Bilateral upper extremity supported, During functional activity, Reliant on assistive device for balance Standing balance-Leahy Scale: Poor Standing balance comment: static standing with RW support                            Cognition Arousal: Alert Behavior During Therapy: Anxious, Flat affect Overall Cognitive Status: No family/caregiver present to determine baseline cognitive functioning                                 General Comments: Pt oriented x4 once sitting EOB, however a few mins later states "i don't even know what day it is" and demonstrates some intermittent confusion        Exercises      General Comments        Pertinent Vitals/Pain Pain Assessment Pain Assessment: Faces Faces Pain Scale: Hurts little more Pain Location: pt did not specify Pain Descriptors / Indicators: Discomfort, Grimacing Pain Intervention(s): Limited activity within patient's tolerance, Monitored during session, Repositioned     PT Goals (current goals can now be found  in the care plan section) Acute Rehab PT Goals Patient Stated Goal: to get better PT Goal Formulation: With patient Time For Goal Achievement: 04/04/23 Progress towards PT goals: Progressing toward goals    Frequency    Min 1X/week       AM-PAC PT "6 Clicks" Mobility   Outcome Measure  Help needed turning from your back to your side while in a flat bed without using bedrails?: A Little Help  needed moving from lying on your back to sitting on the side of a flat bed without using bedrails?: A Little Help needed moving to and from a bed to a chair (including a wheelchair)?: Total Help needed standing up from a chair using your arms (e.g., wheelchair or bedside chair)?: A Lot Help needed to walk in hospital room?: Total Help needed climbing 3-5 steps with a railing? : Total 6 Click Score: 11    End of Session Equipment Utilized During Treatment: Gait belt Activity Tolerance: Patient limited by fatigue Patient left: in bed;with call bell/phone within reach;with bed alarm set;with restraints reapplied Nurse Communication: Mobility status PT Visit Diagnosis: Other abnormalities of gait and mobility (R26.89)     Time: 1610-9604 PT Time Calculation (min) (ACUTE ONLY): 21 min  Charges:    $Therapeutic Activity: 8-22 mins PT General Charges $$ ACUTE PT VISIT: 1 Visit                     Johny Shock, PTA Acute Rehabilitation Services Secure Chat Preferred  [phone]    Johny Shock 03/24/2023, 3:35 PM

## 2023-03-24 NOTE — Progress Notes (Addendum)
Transient hypotension: Patient's nurse reported that patient blood pressure is low 84/41, MAP 55, heart rate 86, respiratory rate 19 O2 sat 98% room air.  Patient is asymptomatic. -Recommending to hold the Cardizem for tonight in the setting of soft blood pressure.  Ordered NS 500 mL bolus and albumin 25 g.  Afterward continue LR 100 cc/h for 10 hours.  -Per chart review patient has been admitted for sepsis in the setting of UTI and community-acquired pneumonia.  As well as patient has AKI and dehydration. -Patient also has history of paroxysmal atrial fibrillation currently on low-dose Cardizem and aspirin.  Holding Cardizem in the setting of soft blood pressure. - Ordering low-dose metoprolol 2.5 mg as needed if heart rate goes above 120.  Tereasa Coop, MD [facility] 03/24/2023, 9:08 PM    Update, patient's blood pressure is still soft MAP is 58 even with the 500 mL of NS bolus and albumin 25 g.  Giving 1 L of LR bolus now.  Tereasa Coop, MD [facility] 03/25/2023, 1:26 AM

## 2023-03-25 DIAGNOSIS — A419 Sepsis, unspecified organism: Secondary | ICD-10-CM | POA: Diagnosis not present

## 2023-03-25 DIAGNOSIS — N179 Acute kidney failure, unspecified: Secondary | ICD-10-CM | POA: Diagnosis not present

## 2023-03-25 DIAGNOSIS — R6520 Severe sepsis without septic shock: Secondary | ICD-10-CM | POA: Diagnosis not present

## 2023-03-25 LAB — CULTURE, BLOOD (ROUTINE X 2)
Culture: NO GROWTH
Culture: NO GROWTH
Special Requests: ADEQUATE

## 2023-03-25 LAB — BASIC METABOLIC PANEL
Anion gap: 8 (ref 5–15)
BUN: 7 mg/dL — ABNORMAL LOW (ref 8–23)
CO2: 22 mmol/L (ref 22–32)
Calcium: 8 mg/dL — ABNORMAL LOW (ref 8.9–10.3)
Chloride: 110 mmol/L (ref 98–111)
Creatinine, Ser: 0.86 mg/dL (ref 0.44–1.00)
GFR, Estimated: >60 mL/min (ref >60)
Glucose, Bld: 93 mg/dL (ref 70–99)
Potassium: 4.4 mmol/L (ref 3.5–5.1)
Sodium: 140 mmol/L (ref 135–145)

## 2023-03-25 LAB — MAGNESIUM: Magnesium: 2.3 mg/dL (ref 1.7–2.4)

## 2023-03-25 MED ORDER — LACTATED RINGERS IV BOLUS
1000.0000 mL | Freq: Once | INTRAVENOUS | Status: AC
  Administered 2023-03-25: 1000 mL via INTRAVENOUS

## 2023-03-25 MED ORDER — QUETIAPINE FUMARATE 25 MG PO TABS
25.0000 mg | ORAL_TABLET | Freq: Every day | ORAL | Status: DC
  Administered 2023-03-26 – 2023-03-28 (×3): 25 mg via ORAL
  Filled 2023-03-25 (×3): qty 1

## 2023-03-25 MED ORDER — QUETIAPINE FUMARATE 50 MG PO TABS
50.0000 mg | ORAL_TABLET | Freq: Every day | ORAL | Status: DC
  Administered 2023-03-25 – 2023-03-27 (×3): 50 mg via ORAL
  Filled 2023-03-25 (×3): qty 1

## 2023-03-25 MED ORDER — PROSOURCE PLUS PO LIQD
30.0000 mL | Freq: Two times a day (BID) | ORAL | Status: DC
  Administered 2023-03-27 – 2023-03-28 (×2): 30 mL via ORAL
  Filled 2023-03-25 (×2): qty 30

## 2023-03-25 NOTE — Progress Notes (Signed)
PROGRESS NOTE                                                                                                                                                                                                             Patient Demographics:    Andrea Morgan, is a 72 y.o. female, DOB - 1951/02/04, LKG:[id number]  Outpatient Primary MD for the patient is Alysia Penna, MD    LOS - 5  Admit date - 03/20/2023    Chief Complaint  Patient presents with   Fall   Weakness       Brief Narrative (HPI from H&P)    72 y.o. female with medical history significant of emphysema, lung cancer, GERD, hyperlipidemia, paroxysmal A-fib, anxiety, and depression who was brought in by EMS after being found down at her home by her neighbors.  Neighbor Louis reports he and his wife found the patient on the floor at the foot of her bed.  They last saw her about 36 hours before.  Just before Thanksgiving she had been experiencing some nausea and dehydration.  The neighbors brought her some Pedialyte and crackers to eat.  Her doctor called in her some nausea medications.    She was brought to the ER where she was diagnosed with sepsis due to combination of UTI and pneumonia and admitted to the hospital.   Subjective:   Patient in bed, appears comfortable, denies any headache, no fever, no chest pain or pressure, no shortness of breath , no abdominal pain. No new focal weakness.    Assessment  & Plan :   Sepsis present on admission due to combination of UTI and community-acquired pneumonia.  Being treated with IV antibiotics and IV fluids, sepsis pathophysiology much improved, CT head, C-spine, chest abdomen pelvis noted.  Continue empiric IV antibiotics, clinically improved, continue gentle hydration with IV fluids, continue supportive care clinically improving.  Follow cultures, preliminary urine cultures growing 40,000 colonies of  Klebsiella.  Dehydration, AKI, hypokalemia, hyponatremia- replace electrolytes, hydrate with IV fluids, AKI improving.  Continue to monitor.  Continue gentle hydration on 03/23/2023.  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  History of underlying COPD.  Supportive care.  Home nebulizer treatments continued, I-S flutter valve added, as needed oxygen as needed.  Paroxysmal atrial fibrillation.  [place] vas 2 score of  greater than 3.  Not on anticoagulation at baseline.  Low-dose Cardizem initiated at lower than home dose as blood pressure is soft, home aspirin resumed.  Defer anticoagulation to PCP and primary cardiologist.  Anxiety, depression.  Low-dose benzodiazepine with caution.  Home venlafaxine and bupropion.  Hypokalemia, hypomagnesemia, hypophosphatemia.  Replace.    Cognitive decline versus early dementia.  Home Aricept.  Risk for delirium.  Minimize narcotics and benzodiazepines.  Hypothyroidism.  TSH suppressed, could be sick euthyroid syndrome, for now hold Synthroid, will reintroduce Synthroid at less than home dose upon discharge, repeat TSH and free T4 in 2 to 3 weeks by PCP.  Metabolic encephalopathy, nighttime hospital-acquired delirium.  Minimize narcotics and benzodiazepines, twice daily Seroquel and as needed Haldol.      Condition - Extremely Guarded  Family Communication  :    Called friend Jeanice Lim [phone] at 03/21/2023 at 7:45 AM.  No response.     Left message for friend Eunice Blase [phone] on 03/21/2023 at 7:50 AM  Code Status :  Full  Consults  :  None  PUD Prophylaxis : PPI   Procedures  :     CT chest, abdomen and pelvis.     1. Bilateral patchy ground-glass airspace. Findings suggestive of infection/inflammation. 2. Air-fluid level within the urinary bladder lumen-correlate for recent instrumentation. If no such finding, recommend urinalysis for evaluation of possible infection. 3. No acute intrathoracic, intra-abdominal,  intrapelvic traumatic injury with limited evaluation due to noncontrast study. 4. No acute fracture or traumatic malalignment of the thoracic or lumbar spine.   Other imaging findings of potential clinical significance:   1. Aortic Atherosclerosis (ICD10-I70.0) and Emphysema (ICD10-J43.9). 2. Colonic diverticulosis with no acute diverticulitis. 3. Small hiatal hernia. 4. Status post right upper lobectomy and right hemicolectomy.  CT Head and C Spine - Non acute      Disposition Plan  :    Status is: Inpatient  DVT Prophylaxis  :    enoxaparin (LOVENOX) injection 40 mg Start: 03/22/23 1000    Lab Results  Component Value Date   PLT 220 03/24/2023    Diet :  Diet Order             Diet regular Room service appropriate? Yes; Fluid consistency: Thin  Diet effective now                    Inpatient Medications  Scheduled Meds:  (feeding supplement) PROSource Plus  30 mL Oral BID BM   ALPRAZolam  0.5 mg Oral BID   arformoterol  15 mcg Nebulization BID   And   umeclidinium bromide  1 puff Inhalation Daily   aspirin EC  81 mg Oral Daily   atorvastatin  20 mg Oral QHS   buPROPion  150 mg Oral Daily   diltiazem  60 mg Oral Q12H   donepezil  5 mg Oral QHS   enoxaparin (LOVENOX) injection  40 mg Subcutaneous Daily   feeding supplement  237 mL Oral TID BM   ferrous sulfate  325 mg Oral BID WC   folic acid  1 mg Oral Daily   mometasone-formoterol  2 puff Inhalation BID   oxidized cellulose  1 each Topical Once   pantoprazole  40 mg Oral Daily   [START ON 03/26/2023] QUEtiapine  25 mg Oral Daily   QUEtiapine  50 mg Oral QHS   venlafaxine XR  37.5 mg Oral Q breakfast   Continuous Infusions:   PRN Meds:.acetaminophen **OR** acetaminophen,  albuterol, melatonin, metoprolol tartrate, [DISCONTINUED] ondansetron **OR** ondansetron (ZOFRAN) IV    Objective:   Vitals:   03/25/23 0430 03/25/23 0800 03/25/23 0810 03/25/23 0954  BP: (!) 96/58 122/60 (!) 114/93   Pulse:  90  (!) 101   Resp: 17  18   Temp: 97.7 F (36.5 C)  97.8 F (36.6 C)   TempSrc: Oral  Oral   SpO2: 97% 100% 100% 99%  Weight:      Height:        Wt Readings from Last 3 Encounters:  03/20/23 57.8 kg  01/31/23 65.6 kg  10/15/22 67.1 kg     Intake/Output Summary (Last 24 hours) at 03/25/2023 1102 Last data filed at 03/25/2023 6962 Gross per 24 hour  Intake 1323.41 ml  Output 2850 ml  Net -1526.59 ml     Physical Exam  Awake but mildly confused, no new F.N deficits, Normal affect [place].AT,PERRAL Supple Neck, No JVD,   Symmetrical Chest wall movement, Good air movement bilaterally, CTAB RRR,No Gallops,Rubs or new Murmurs,  +ve B.Sounds, Abd Soft, No tenderness,   No Cyanosis, Clubbing or edema       Data Review:    Recent Labs  Lab 03/20/23 1442 03/20/23 1456 03/21/23 0430 03/22/23 0030 03/22/23 0434 03/23/23 0425 03/24/23 0507 03/24/23 2150  WBC 19.6*  --  12.8*  --  7.9 8.6 9.0 8.1  HGB 14.0   < > 9.5* 8.1* 8.0* 8.3* 9.0* 8.8*  HCT 42.2   < > 29.7* 24.5* 25.5* 26.4* 27.4* 27.2*  PLT 360  --  249  --  196 204 223 220  MCV 78.3*  --  79.8*  --  81.0 80.2 78.5* 80.7  MCH 26.0  --  25.5*  --  25.4* 25.2* 25.8* 26.1  MCHC 33.2  --  32.0  --  31.4 31.4 32.8 32.4  RDW 17.5*  --  17.2*  --  17.8* 17.6* 17.7* 18.0*  LYMPHSABS 1.5  --   --   --  1.8 0.9 1.1  --   MONOABS 1.6*  --   --   --  0.6 0.6 0.6  --   EOSABS 0.0  --   --   --  0.3 0.1 0.2  --   BASOSABS 0.1  --   --   --  0.0 0.0 0.0  --    < > = values in this interval not displayed.    Recent Labs  Lab 03/20/23 1442 03/20/23 1445 03/20/23 1456 03/20/23 1749 03/20/23 2304 03/21/23 0151 03/21/23 0219 03/21/23 0225 03/21/23 0430 03/22/23 0434 03/23/23 0425 03/23/23 0502 03/24/23 0507 03/24/23 2150 03/25/23 0358  NA 140  --  138  --   --   --  138  --   --  133* 134*  --  140 137 140  K 3.0*  --  2.9*  --   --   --  2.8*  --   --  3.4* 3.7  --  3.0* 3.6 4.4  CL 91*  --  94*  --   --   --  101   --   --  101 105  --  104 105 110  CO2 29  --   --   --   --   --  27  --   --  24 26  --  24 22 22   ANIONGAP 20*  --   --   --   --   --  10  --   --  8 3*  --  12 10 8   GLUCOSE 128*  --  128*  --   --   --  96  --   --  83 101*  --  83 125* 93  BUN 68*  --  58*  --   --   --  48*  --   --  25* 15  --  9 8 7*  CREATININE 1.93*  --  2.00*  --   --   --  1.24*  --   --  0.81 0.85  --  0.78 0.85 0.86  AST 26  --   --   --   --   --  18  --   --   --   --   --   --   --   --   ALT 24  --   --   --   --   --  14  --   --   --   --   --   --   --   --   ALKPHOS 59  --   --   --   --   --  37*  --   --   --   --   --   --   --   --   BILITOT 1.1  --   --   --   --   --  1.0  --   --   --   --   --   --   --   --   ALBUMIN 3.2*  --   --   --   --   --  2.0*  --   --   --   --   --   --   --   --   CRP  --  14.5*  --   --   --   --   --   --   --  6.1* 3.4*  --  5.5*  --   --   PROCALCITON  --   --   --   --   --  0.10  --   --   --  <0.10 <0.10  --  <0.10  --   --   LATICACIDVEN  --   --  3.7* 3.3* 1.1  --   --  1.1  --   --   --   --   --   --   --   INR 1.0  --   --   --   --   --   --   --   --   --   --   --   --   --   --   TSH  --   --   --   --  0.022*  --   --   --   --  0.018*  --  0.058*  --   --   --   HGBA1C  --   --   --   --  5.3  --   --   --   --   --   --   --   --   --   --   BNP  --   --   --   --   --   --   --   --   --  409.4* 725.0*  --  609.6*  --   --  MG 2.2  --   --   --   --   --   --   --  1.7 1.9 1.9  --  1.6*  --  2.3  CALCIUM 10.3  --   --   --   --   --  8.2*  --   --  7.6* 7.6*  --  7.6* 7.7* 8.0*      Recent Labs  Lab 03/20/23 1442 03/20/23 1445 03/20/23 1456 03/20/23 1749 03/20/23 2304 03/21/23 0151 03/21/23 0219 03/21/23 0225 03/21/23 0430 03/22/23 0434 03/23/23 0425 03/23/23 0502 03/24/23 0507 03/24/23 2150 03/25/23 0358  CRP  --  14.5*  --   --   --   --   --   --   --  6.1* 3.4*  --  5.5*  --   --   PROCALCITON  --   --   --   --   --  0.10   --   --   --  <0.10 <0.10  --  <0.10  --   --   LATICACIDVEN  --   --  3.7* 3.3* 1.1  --   --  1.1  --   --   --   --   --   --   --   INR 1.0  --   --   --   --   --   --   --   --   --   --   --   --   --   --   TSH  --   --   --   --  0.022*  --   --   --   --  0.018*  --  0.058*  --   --   --   HGBA1C  --   --   --   --  5.3  --   --   --   --   --   --   --   --   --   --   BNP  --   --   --   --   --   --   --   --   --  409.4* 725.0*  --  609.6*  --   --   MG 2.2  --   --   --   --   --   --   --  1.7 1.9 1.9  --  1.6*  --  2.3  CALCIUM 10.3  --   --   --   --   --    < >  --   --  7.6* 7.6*  --  7.6* 7.7* 8.0*   < > = values in this interval not displayed.     Recent Labs    03/23/23 0502  TSH 0.058*  FREET4 0.89   Radiology Reports DG Chest Port 1 View  Result Date: 03/22/2023 CLINICAL DATA:  Short of breath. EXAM: PORTABLE CHEST 1 VIEW COMPARISON:  03/20/2023 and older studies. FINDINGS: Cardiac silhouette is normal in size. No mediastinal or hilar masses. Stable changes from previous right lung surgery. Prominent interstitial markings noted bilaterally. No lung consolidation or convincing pulmonary edema. No pleural effusion and no pneumothorax. Skeletal structures are grossly intact. IMPRESSION: 1. No acute cardiopulmonary disease. Electronically Signed   By: Amie Portland M.D.   On: 03/22/2023 08:26      Signature  -   Susa Raring M.D  on 03/25/2023 at 11:02 AM   -  To page go to [email or web address]

## 2023-03-25 NOTE — Plan of Care (Signed)
?  Problem: Clinical Measurements: ?Goal: Ability to maintain clinical measurements within normal limits will improve ?Outcome: Progressing ?Goal: Will remain free from infection ?Outcome: Progressing ?Goal: Diagnostic test results will improve ?Outcome: Progressing ?  ?

## 2023-03-25 NOTE — Progress Notes (Signed)
Patient sister, Andrea Morgan called and advised she wanted to be removed from emergency contact list and that patient [name] spoke with [name] and advised she did not want Andrea Morgan to be on her contact list. The two sisters are estranged since December 2023.    I advised I would remove her (inactivate) from the emergency contact list.

## 2023-03-25 NOTE — TOC Progression Note (Signed)
Transition of Care [facility]) - Progression Note    Patient Details  Name: Andrea Morgan MRN: [id number] Date of Birth: 1950/05/10  Transition of Care [facility] CM/SW Contact  Erin Sons, [place] Phone Number: 03/25/2023, 1:41 PM  Clinical Narrative:     Spoke with Susie at [facility]. They can admit pt tomorrow but transport would have to be scheduled for after 12pm. DC summary would also have to be faxed to [phone].  Expected Discharge Plan: Skilled Nursing Facility Barriers to Discharge: Continued Medical Work up, English as a second language teacher, SNF Pending bed offer  Expected Discharge Plan and Services In-house Referral: Clinical Social Work   Post Acute Care Choice: Skilled Nursing Facility Living arrangements for the past 2 months: Apartment                                       Social Determinants of Health (SDOH) Interventions SDOH Screenings   Food Insecurity: No Food Insecurity (03/07/2022)  Housing: Low Risk  (03/07/2022)  Transportation Needs: No Transportation Needs (03/07/2022)  Utilities: Not At Risk (03/07/2022)  Tobacco Use: Medium Risk (03/20/2023)    Readmission Risk Interventions     No data to display

## 2023-03-25 NOTE — Plan of Care (Signed)
  Problem: Education: Goal: Knowledge of General Education information will improve Description: Including pain rating scale, medication(s)/side effects and non-pharmacologic comfort measures Outcome: Progressing   Problem: Health Behavior/Discharge Planning: Goal: Ability to manage health-related needs will improve Outcome: Progressing   Problem: Clinical Measurements: Goal: Ability to maintain clinical measurements within normal limits will improve Outcome: Progressing Goal: Will remain free from infection Outcome: Progressing Goal: Diagnostic test results will improve Outcome: Progressing Goal: Respiratory complications will improve Outcome: Progressing Goal: Cardiovascular complication will be avoided Outcome: Progressing   Problem: Activity: Goal: Risk for activity intolerance will decrease Outcome: Progressing   Problem: Nutrition: Goal: Adequate nutrition will be maintained Outcome: Progressing   Problem: Coping: Goal: Level of anxiety will decrease Outcome: Progressing   Problem: Elimination: Goal: Will not experience complications related to bowel motility Outcome: Progressing Goal: Will not experience complications related to urinary retention Outcome: Progressing   Problem: Pain Management: Goal: General experience of comfort will improve Outcome: Progressing   Problem: Safety: Goal: Ability to remain free from injury will improve Outcome: Progressing   Problem: Skin Integrity: Goal: Risk for impaired skin integrity will decrease Outcome: Progressing   Problem: Fluid Volume: Goal: Hemodynamic stability will improve Outcome: Progressing   Problem: Clinical Measurements: Goal: Diagnostic test results will improve Outcome: Progressing Goal: Signs and symptoms of infection will decrease Outcome: Progressing   Problem: Respiratory: Goal: Ability to maintain adequate ventilation will improve Outcome: Progressing   Problem: Safety: Goal:  Non-violent Restraint(s) Outcome: Progressing

## 2023-03-26 DIAGNOSIS — A419 Sepsis, unspecified organism: Secondary | ICD-10-CM | POA: Diagnosis not present

## 2023-03-26 DIAGNOSIS — N179 Acute kidney failure, unspecified: Secondary | ICD-10-CM | POA: Diagnosis not present

## 2023-03-26 DIAGNOSIS — R6520 Severe sepsis without septic shock: Secondary | ICD-10-CM | POA: Diagnosis not present

## 2023-03-26 LAB — BASIC METABOLIC PANEL
Anion gap: 7 (ref 5–15)
BUN: <5 mg/dL — ABNORMAL LOW (ref 8–23)
CO2: 19 mmol/L — ABNORMAL LOW (ref 22–32)
Calcium: 8.2 mg/dL — ABNORMAL LOW (ref 8.9–10.3)
Chloride: 112 mmol/L — ABNORMAL HIGH (ref 98–111)
Creatinine, Ser: 0.82 mg/dL (ref 0.44–1.00)
GFR, Estimated: >60 mL/min (ref >60)
Glucose, Bld: 96 mg/dL (ref 70–99)
Potassium: 4.1 mmol/L (ref 3.5–5.1)
Sodium: 138 mmol/L (ref 135–145)

## 2023-03-26 NOTE — Plan of Care (Signed)
  Problem: Education: Goal: Knowledge of General Education information will improve Description: Including pain rating scale, medication(s)/side effects and non-pharmacologic comfort measures Outcome: Progressing   Problem: Health Behavior/Discharge Planning: Goal: Ability to manage health-related needs will improve Outcome: Progressing   Problem: Clinical Measurements: Goal: Ability to maintain clinical measurements within normal limits will improve Outcome: Progressing Goal: Will remain free from infection Outcome: Progressing Goal: Diagnostic test results will improve Outcome: Progressing Goal: Respiratory complications will improve Outcome: Progressing Goal: Cardiovascular complication will be avoided Outcome: Progressing   Problem: Activity: Goal: Risk for activity intolerance will decrease Outcome: Progressing   Problem: Nutrition: Goal: Adequate nutrition will be maintained Outcome: Progressing   Problem: Coping: Goal: Level of anxiety will decrease Outcome: Progressing   Problem: Elimination: Goal: Will not experience complications related to bowel motility Outcome: Progressing Goal: Will not experience complications related to urinary retention Outcome: Progressing   Problem: Pain Management: Goal: General experience of comfort will improve Outcome: Progressing   Problem: Safety: Goal: Ability to remain free from injury will improve Outcome: Progressing   Problem: Skin Integrity: Goal: Risk for impaired skin integrity will decrease Outcome: Progressing   Problem: Fluid Volume: Goal: Hemodynamic stability will improve Outcome: Progressing   Problem: Clinical Measurements: Goal: Diagnostic test results will improve Outcome: Progressing Goal: Signs and symptoms of infection will decrease Outcome: Progressing   Problem: Respiratory: Goal: Ability to maintain adequate ventilation will improve Outcome: Progressing   Problem: Safety: Goal:  Non-violent Restraint(s) Outcome: Progressing

## 2023-03-26 NOTE — Progress Notes (Signed)
PROGRESS NOTE                                                                                                                                                                                                             Patient Demographics:    Andrea Morgan, is a 72 y.o. female, DOB - Mar 31, 1951, ZOX:[id number]  Outpatient Primary MD for the patient is Alysia Penna, MD    LOS - 6  Admit date - 03/20/2023    Chief Complaint  Patient presents with   Fall   Weakness       Brief Narrative (HPI from H&P)    72 y.o. female with medical history significant of emphysema, lung cancer, GERD, hyperlipidemia, paroxysmal A-fib, anxiety, and depression who was brought in by EMS after being found down at her home by her neighbors.  Neighbor Louis reports he and his wife found the patient on the floor at the foot of her bed.  They last saw her about 36 hours before.  Just before Thanksgiving she had been experiencing some nausea and dehydration.  The neighbors brought her some Pedialyte and crackers to eat.  Her doctor called in her some nausea medications.    She was brought to the ER where she was diagnosed with sepsis due to combination of UTI and pneumonia and admitted to the hospital.   Subjective:   Patient in bed comfortable but confused, denies any headache chest or abdominal pain.   Assessment  & Plan :   Sepsis present on admission due to combination of UTI and community-acquired pneumonia.  Being treated with IV antibiotics and IV fluids, sepsis pathophysiology much improved, CT head, C-spine, chest abdomen pelvis noted.  Continue empiric IV antibiotics, clinically improved, continue gentle hydration with IV fluids, finished treatment with IV antibiotics for UTI.  Dehydration, AKI, hypokalemia, hyponatremia- replace electrolytes, hydrate with IV fluids, AKI improving.  Continue to monitor.  Been adequately  hydrated.  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  History of underlying COPD.  Supportive care.  Home nebulizer treatments continued, I-S flutter valve added, as needed oxygen as needed.  Paroxysmal atrial fibrillation.  [place] vas 2 score of greater than 3.  Not on anticoagulation at baseline.  Low-dose Cardizem initiated at lower than home dose as blood pressure is soft, home aspirin resumed.  Defer anticoagulation to PCP and primary cardiologist.  Anxiety, depression.  Low-dose benzodiazepine with caution.  Home venlafaxine and bupropion.  Hypokalemia, hypomagnesemia, hypophosphatemia.  Replace.    Cognitive decline versus early dementia.  Home Aricept.  Risk for delirium.  Minimize narcotics and benzodiazepines.  Placed on Seroquel for ongoing metabolic encephalopathy.  Hypothyroidism.  TSH suppressed, could be sick euthyroid syndrome, for now hold Synthroid, will reintroduce Synthroid at less than home dose upon discharge, repeat TSH and free T4 in 2 to 3 weeks by PCP.  Metabolic encephalopathy, nighttime hospital-acquired delirium.  Minimize narcotics and benzodiazepines, twice daily Seroquel and as needed Haldol.      Condition - Extremely Guarded  Family Communication  :    Called friend Jeanice Lim [phone] at 03/21/2023 at 7:45 AM.  No response.     Left message for friend Eunice Blase [phone] on 03/21/2023 at 7:50 AM  Code Status :  Full  Consults  :  None  PUD Prophylaxis : PPI   Procedures  :     CT chest, abdomen and pelvis.     1. Bilateral patchy ground-glass airspace. Findings suggestive of infection/inflammation. 2. Air-fluid level within the urinary bladder lumen-correlate for recent instrumentation. If no such finding, recommend urinalysis for evaluation of possible infection. 3. No acute intrathoracic, intra-abdominal, intrapelvic traumatic injury with limited evaluation due to noncontrast study. 4. No acute fracture or traumatic  malalignment of the thoracic or lumbar spine.   Other imaging findings of potential clinical significance:   1. Aortic Atherosclerosis (ICD10-I70.0) and Emphysema (ICD10-J43.9). 2. Colonic diverticulosis with no acute diverticulitis. 3. Small hiatal hernia. 4. Status post right upper lobectomy and right hemicolectomy.  CT Head and C Spine - Non acute      Disposition Plan  :    Status is: Inpatient  DVT Prophylaxis  :    enoxaparin (LOVENOX) injection 40 mg Start: 03/22/23 1000    Lab Results  Component Value Date   PLT 220 03/24/2023    Diet :  Diet Order             Diet regular Room service appropriate? Yes; Fluid consistency: Thin  Diet effective now                    Inpatient Medications  Scheduled Meds:  (feeding supplement) PROSource Plus  30 mL Oral BID BM   ALPRAZolam  0.5 mg Oral BID   arformoterol  15 mcg Nebulization BID   And   umeclidinium bromide  1 puff Inhalation Daily   aspirin EC  81 mg Oral Daily   atorvastatin  20 mg Oral QHS   buPROPion  150 mg Oral Daily   diltiazem  60 mg Oral Q12H   donepezil  5 mg Oral QHS   enoxaparin (LOVENOX) injection  40 mg Subcutaneous Daily   feeding supplement  237 mL Oral TID BM   ferrous sulfate  325 mg Oral BID WC   folic acid  1 mg Oral Daily   mometasone-formoterol  2 puff Inhalation BID   oxidized cellulose  1 each Topical Once   pantoprazole  40 mg Oral Daily   QUEtiapine  25 mg Oral Daily   QUEtiapine  50 mg Oral QHS   venlafaxine XR  37.5 mg Oral Q breakfast   Continuous Infusions:   PRN Meds:.acetaminophen **OR** acetaminophen, albuterol, melatonin, metoprolol tartrate, [DISCONTINUED] ondansetron **OR** ondansetron (ZOFRAN) IV    Objective:   Vitals:   03/25/23 1943 03/25/23  2100 03/26/23 0100 03/26/23 0513  BP:  (!) 129/108 108/80 (!) 110/52  Pulse:  96 96 90  Resp:  18 19   Temp:  97.8 F (36.6 C) 98.9 F (37.2 C) 98.7 F (37.1 C)  TempSrc:  Oral Oral Oral  SpO2: 95% 94%  96% 97%  Weight:      Height:        Wt Readings from Last 3 Encounters:  03/20/23 57.8 kg  01/31/23 65.6 kg  10/15/22 67.1 kg     Intake/Output Summary (Last 24 hours) at 03/26/2023 0916 Last data filed at 03/25/2023 2130 Gross per 24 hour  Intake --  Output 1450 ml  Net -1450 ml     Physical Exam  Awake but confused, no new F.N deficits, Normal affect Roy.AT,PERRAL Supple Neck, No JVD,   Symmetrical Chest wall movement, Good air movement bilaterally, CTAB RRR,No Gallops,Rubs or new Murmurs,  +ve B.Sounds, Abd Soft, No tenderness,   No Cyanosis, Clubbing or edema       Data Review:    Recent Labs  Lab 03/20/23 1442 03/20/23 1456 03/21/23 0430 03/22/23 0030 03/22/23 0434 03/23/23 0425 03/24/23 0507 03/24/23 2150  WBC 19.6*  --  12.8*  --  7.9 8.6 9.0 8.1  HGB 14.0   < > 9.5* 8.1* 8.0* 8.3* 9.0* 8.8*  HCT 42.2   < > 29.7* 24.5* 25.5* 26.4* 27.4* 27.2*  PLT 360  --  249  --  196 204 223 220  MCV 78.3*  --  79.8*  --  81.0 80.2 78.5* 80.7  MCH 26.0  --  25.5*  --  25.4* 25.2* 25.8* 26.1  MCHC 33.2  --  32.0  --  31.4 31.4 32.8 32.4  RDW 17.5*  --  17.2*  --  17.8* 17.6* 17.7* 18.0*  LYMPHSABS 1.5  --   --   --  1.8 0.9 1.1  --   MONOABS 1.6*  --   --   --  0.6 0.6 0.6  --   EOSABS 0.0  --   --   --  0.3 0.1 0.2  --   BASOSABS 0.1  --   --   --  0.0 0.0 0.0  --    < > = values in this interval not displayed.    Recent Labs  Lab 03/20/23 1442 03/20/23 1445 03/20/23 1456 03/20/23 1749 03/20/23 2304 03/21/23 0151 03/21/23 0219 03/21/23 0225 03/21/23 0430 03/22/23 0434 03/23/23 0425 03/23/23 0502 03/24/23 0507 03/24/23 2150 03/25/23 0358 03/26/23 0421  NA 140  --  138  --   --   --  138  --   --  133* 134*  --  140 137 140 138  K 3.0*  --  2.9*  --   --   --  2.8*  --   --  3.4* 3.7  --  3.0* 3.6 4.4 4.1  CL 91*  --  94*  --   --   --  101  --   --  101 105  --  104 105 110 112*  CO2 29  --   --   --   --   --  27  --   --  24 26  --  24 22 22  19*   ANIONGAP 20*  --   --   --   --   --  10  --   --  8 3*  --  12 10 8 7   GLUCOSE  128*  --  128*  --   --   --  96  --   --  83 101*  --  83 125* 93 96  BUN 68*  --  58*  --   --   --  48*  --   --  25* 15  --  9 8 7* <5*  CREATININE 1.93*  --  2.00*  --   --   --  1.24*  --   --  0.81 0.85  --  0.78 0.85 0.86 0.82  AST 26  --   --   --   --   --  18  --   --   --   --   --   --   --   --   --   ALT 24  --   --   --   --   --  14  --   --   --   --   --   --   --   --   --   ALKPHOS 59  --   --   --   --   --  37*  --   --   --   --   --   --   --   --   --   BILITOT 1.1  --   --   --   --   --  1.0  --   --   --   --   --   --   --   --   --   ALBUMIN 3.2*  --   --   --   --   --  2.0*  --   --   --   --   --   --   --   --   --   CRP  --  14.5*  --   --   --   --   --   --   --  6.1* 3.4*  --  5.5*  --   --   --   PROCALCITON  --   --   --   --   --  0.10  --   --   --  <0.10 <0.10  --  <0.10  --   --   --   LATICACIDVEN  --   --  3.7* 3.3* 1.1  --   --  1.1  --   --   --   --   --   --   --   --   INR 1.0  --   --   --   --   --   --   --   --   --   --   --   --   --   --   --   TSH  --   --   --   --  0.022*  --   --   --   --  0.018*  --  0.058*  --   --   --   --   HGBA1C  --   --   --   --  5.3  --   --   --   --   --   --   --   --   --   --   --   BNP  --   --   --   --   --   --   --   --   --  409.4* 725.0*  --  609.6*  --   --   --   MG 2.2  --   --   --   --   --   --   --  1.7 1.9 1.9  --  1.6*  --  2.3  --   CALCIUM 10.3  --   --   --   --   --  8.2*  --   --  7.6* 7.6*  --  7.6* 7.7* 8.0* 8.2*      Recent Labs  Lab 03/20/23 1442 03/20/23 1445 03/20/23 1456 03/20/23 1749 03/20/23 2304 03/21/23 0151 03/21/23 0219 03/21/23 0225 03/21/23 0430 03/22/23 0434 03/23/23 0425 03/23/23 0502 03/24/23 0507 03/24/23 2150 03/25/23 0358 03/26/23 0421  CRP  --  14.5*  --   --   --   --   --   --   --  6.1* 3.4*  --  5.5*  --   --   --   PROCALCITON  --   --   --   --   --   0.10  --   --   --  <0.10 <0.10  --  <0.10  --   --   --   LATICACIDVEN  --   --  3.7* 3.3* 1.1  --   --  1.1  --   --   --   --   --   --   --   --   INR 1.0  --   --   --   --   --   --   --   --   --   --   --   --   --   --   --   TSH  --   --   --   --  0.022*  --   --   --   --  0.018*  --  0.058*  --   --   --   --   HGBA1C  --   --   --   --  5.3  --   --   --   --   --   --   --   --   --   --   --   BNP  --   --   --   --   --   --   --   --   --  409.4* 725.0*  --  609.6*  --   --   --   MG 2.2  --   --   --   --   --   --   --  1.7 1.9 1.9  --  1.6*  --  2.3  --   CALCIUM 10.3  --   --   --   --   --    < >  --   --  7.6* 7.6*  --  7.6* 7.7* 8.0* 8.2*   < > = values in this interval not displayed.     No results for input(s): "TSH", "T4TOTAL", "FREET4", "T3FREE", "THYROIDAB" in the last 72 hours.  Radiology Reports No results found.    Signature  -   Susa Raring M.D on 03/26/2023 at 9:16 AM   -  To page go to [email or web address]

## 2023-03-27 DIAGNOSIS — R6520 Severe sepsis without septic shock: Secondary | ICD-10-CM | POA: Diagnosis not present

## 2023-03-27 DIAGNOSIS — A419 Sepsis, unspecified organism: Secondary | ICD-10-CM | POA: Diagnosis not present

## 2023-03-27 DIAGNOSIS — N179 Acute kidney failure, unspecified: Secondary | ICD-10-CM | POA: Diagnosis not present

## 2023-03-27 MED ORDER — LEVOTHYROXINE SODIUM 50 MCG PO TABS: 50.0000 ug | ORAL_TABLET | Freq: Every day | ORAL | Status: AC

## 2023-03-27 MED ORDER — FERROUS SULFATE 325 (65 FE) MG PO TABS: 325.0000 mg | ORAL_TABLET | Freq: Two times a day (BID) | ORAL | Status: AC

## 2023-03-27 MED ORDER — LEVOTHYROXINE SODIUM 50 MCG PO TABS: 88.0000 ug | ORAL_TABLET | Freq: Every morning | ORAL | Status: DC

## 2023-03-27 MED ORDER — ALPRAZOLAM 0.5 MG PO TABS: 0.5000 mg | ORAL_TABLET | Freq: Two times a day (BID) | ORAL | 0 refills | Status: AC

## 2023-03-27 MED ORDER — LEVOTHYROXINE SODIUM 50 MCG PO TABS
50.0000 ug | ORAL_TABLET | Freq: Every day | ORAL | Status: DC
  Administered 2023-03-27 – 2023-03-28 (×2): 50 ug via ORAL
  Filled 2023-03-27 (×2): qty 1

## 2023-03-27 MED ORDER — QUETIAPINE FUMARATE 25 MG PO TABS: 25.0000 mg | ORAL_TABLET | Freq: Two times a day (BID) | ORAL | Status: AC

## 2023-03-27 NOTE — TOC Progression Note (Signed)
Transition of Care [facility]) - Progression Note    Patient Details  Name: JALEESHA BUCKEL MRN: [id number] Date of Birth: 1950-05-02  Transition of Care [facility]) CM/SW Contact  Eduard Roux, [place] Phone Number: 03/27/2023, 10:44 AM  Clinical Narrative:     CSW called [facility]/ T[facility] Endo- lvm to return call- waiting on response.   Antony Blackbird, MSW, LCSW Clinical Social Worker    Expected Discharge Plan: Skilled Nursing Facility Barriers to Discharge: Continued Medical Work up, English as a second language teacher, SNF Pending bed offer  Expected Discharge Plan and Services In-house Referral: Clinical Social Work   Post Acute Care Choice: Skilled Nursing Facility Living arrangements for the past 2 months: Apartment Expected Discharge Date: 03/27/23                                     Social Determinants of Health (SDOH) Interventions SDOH Screenings   Food Insecurity: No Food Insecurity (03/07/2022)  Housing: Low Risk  (03/07/2022)  Transportation Needs: No Transportation Needs (03/07/2022)  Utilities: Not At Risk (03/07/2022)  Tobacco Use: Medium Risk (03/20/2023)    Readmission Risk Interventions     No data to display

## 2023-03-27 NOTE — Discharge Instructions (Signed)
Follow with Primary MD Alysia Penna, MD in 7 days   Get CBC, CMP, 2 view Chest X ray -  checked next visit with your primary MD    Activity: As tolerated with Full fall precautions use walker/cane & assistance as needed  Disposition Home    Diet: Soft with feeding assistance and aspiration precautions.   Special Instructions: If you have smoked or chewed Tobacco  in the last 2 yrs please stop smoking, stop any regular Alcohol  and or any Recreational drug use.  On your next visit with your primary care physician please Get Medicines reviewed and adjusted.  Please request your Prim.MD to go over all Hospital Tests and Procedure/Radiological results at the follow up, please get all Hospital records sent to your Prim MD by signing hospital release before you go home.  If you experience worsening of your admission symptoms, develop shortness of breath, life threatening emergency, suicidal or homicidal thoughts you must seek medical attention immediately by calling 911 or calling your MD immediately  if symptoms less severe.  You Must read complete instructions/literature along with all the possible adverse reactions/side effects for all the Medicines you take and that have been prescribed to you. Take any new Medicines after you have completely understood and accpet all the possible adverse reactions/side effects.   Do not drive when taking Pain medications.  Do not take more than prescribed Pain, Sleep and Anxiety Medications

## 2023-03-27 NOTE — Discharge Summary (Addendum)
ERICKA KELZER ZOX:[id number] DOB: 1950-10-11 DOA: 03/20/2023  PCP: Alysia Penna, MD  Admit date: 03/20/2023  Discharge date: 03/27/2023  Admitted From: Home   Disposition:  SNF   Recommendations for Outpatient Follow-up:   Follow up with PCP in 1-2 weeks  PCP Please obtain BMP/CBC, 2 view CXR in 1week,  (see Discharge instructions)   PCP Please follow up on the following pending results:    Home Health: None   Equipment/Devices: None  Consultations: None  Discharge Condition: Stable    CODE STATUS: Full    Diet Recommendation: Soft - with feeding assistance and aspiration precautions    Chief Complaint  Patient presents with   Fall   Weakness     Brief history of present illness from the day of admission and additional interim summary    72 y.o. female with medical history significant of emphysema, lung cancer, GERD, hyperlipidemia, paroxysmal A-fib, anxiety, and depression who was brought in by EMS after being found down at her home by her neighbors.  Neighbor Louis reports he and his wife found the patient on the floor at the foot of her bed.  They last saw her about 36 hours before.  Just before Thanksgiving she had been experiencing some nausea and dehydration.  The neighbors brought her some Pedialyte and crackers to eat.  Her doctor called in her some nausea medications.     She was brought to the ER where she was diagnosed with sepsis due to combination of UTI and pneumonia and admitted to the hospital.                                                                 Hospital Course   Sepsis present on admission due to combination of UTI and community-acquired pneumonia.  Was treated with IV antibiotics and fluids, sepsis pathophysiology has resolved, no fever or leukocytosis now, CT head,  C-spine, chest abdomen pelvis noted.  Still quite frail and lives alone, worked with PT OT will qualify for SNF.   Dehydration, AKI, hypokalemia, hyponatremia-electrolytes replaced, hydrated with IV fluids, AKI resolved.   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  History of underlying COPD.  Supportive care.  Home nebulizer treatments continued, I-S flutter valve added, as needed oxygen as needed.   Paroxysmal atrial fibrillation.  [place] vas 2 score of greater than 3.  Not on anticoagulation at baseline.  Low-dose Cardizem initiated at lower than home dose as blood pressure is soft, home aspirin resumed.  Defer anticoagulation to PCP and primary cardiologist.   Anxiety, depression.  Low-dose benzodiazepine with caution.  Home venlafaxine and bupropion.   Hypokalemia, hypomagnesemia, hypophosphatemia.  Replaced.     Cognitive decline versus early dementia.  Home Aricept.  Risk for delirium.  Minimize narcotics and  benzodiazepines.  Placed on Seroquel for ongoing metabolic encephalopathy.  Continue home medications.   Hypothyroidism.  TSH suppressed, could be sick euthyroid syndrome, for now hold Synthroid, will reintroduce Synthroid at less than home dose upon discharge, repeat TSH and free T4 in 2 to 3 weeks by PCP-SNF MD.   Metabolic encephalopathy, nighttime hospital-acquired delirium.  Minimize narcotics and benzodiazepines, twice daily Seroquel along with feeding assistance and aspiration precautions at SNF.  Discharge diagnosis     Principal Problem:   Sepsis (HCC) Active Problems:   COPD (chronic obstructive pulmonary disease) (HCC)   HLD (hyperlipidemia)   Anxiety   S/P Robotic Assisted Video Thoracosocpy with Right Upper Lobectomy   Adenocarcinoma of lung, right (HCC)   UTI (urinary tract infection)   Hypothyroidism    Discharge instructions    Discharge Instructions     Discharge instructions   Complete by: As directed    Follow with  Primary MD Alysia Penna, MD in 7 days   Get CBC, CMP, 2 view Chest X ray -  checked next visit with your primary MD    Activity: As tolerated with Full fall precautions use walker/cane & assistance as needed  Disposition Home    Diet: Soft with feeding assistance and aspiration precautions.   Special Instructions: If you have smoked or chewed Tobacco  in the last 2 yrs please stop smoking, stop any regular Alcohol  and or any Recreational drug use.  On your next visit with your primary care physician please Get Medicines reviewed and adjusted.  Please request your Prim.MD to go over all Hospital Tests and Procedure/Radiological results at the follow up, please get all Hospital records sent to your Prim MD by signing hospital release before you go home.  If you experience worsening of your admission symptoms, develop shortness of breath, life threatening emergency, suicidal or homicidal thoughts you must seek medical attention immediately by calling 911 or calling your MD immediately  if symptoms less severe.  You Must read complete instructions/literature along with all the possible adverse reactions/side effects for all the Medicines you take and that have been prescribed to you. Take any new Medicines after you have completely understood and accpet all the possible adverse reactions/side effects.   Do not drive when taking Pain medications.  Do not take more than prescribed Pain, Sleep and Anxiety Medications   Increase activity slowly   Complete by: As directed        Discharge Medications   Allergies as of 03/27/2023       Reactions   Oxycontin [oxycodone] Itching   Sulfa Antibiotics Hives   Morphine And Codeine Itching        Medication List     STOP taking these medications    dexamethasone 4 MG tablet Commonly known as: DECADRON   potassium chloride SA 20 MEQ tablet Commonly known as: KLOR-CON M   pregabalin 25 MG capsule Commonly known as: Lyrica    prochlorperazine 10 MG tablet Commonly known as: COMPAZINE   zolpidem 10 MG tablet Commonly known as: AMBIEN       TAKE these medications    albuterol 108 (90 Base) MCG/ACT inhaler Commonly known as: VENTOLIN HFA Inhale 2 puffs into the lungs every 6 (six) hours as needed for wheezing or shortness of breath. LF 05/20/2022: Sold 05/22/2022 33 day supply   alendronate 70 MG tablet Commonly known as: FOSAMAX Take 70 mg by mouth See admin instructions. 70 mg once a week on Thursday LF 01/01/2023:  Sold 01/12/2023 84 day supply.   ALPRAZolam 0.5 MG tablet Commonly known as: XANAX Take 1 tablet (0.5 mg total) by mouth 2 (two) times daily. What changed:  medication strength how much to take when to take this reasons to take this additional instructions   ASCORBIC ACID PO Take 1 tablet by mouth daily. Vitamin C, unknown strength.   aspirin EC 81 MG tablet Take 81 mg by mouth daily.   atorvastatin 20 MG tablet Commonly known as: LIPITOR Take 20 mg by mouth at bedtime. LF 12/27/2022: Sold 12/31/2022.   buPROPion 150 MG 12 hr tablet Commonly known as: WELLBUTRIN SR Take 150 mg by mouth daily. LF 12/27/2022: Sold 12/31/2022 90 day supply.   carvedilol 3.125 MG tablet Commonly known as: COREG Take 3.125 mg by mouth every morning. LF 10/05/2022: Sold 10/11/2022 15 day supply   desvenlafaxine 50 MG 24 hr tablet Commonly known as: PRISTIQ Take 50 mg by mouth daily. LF 03/02/2023: Sold 03/03/2023 90 day supply.   diltiazem 120 MG 24 hr capsule Commonly known as: CARDIZEM CD TAKE 1 CAPSULE (120 MG TOTAL) BY MOUTH DAILY. What changed: additional instructions   donepezil 5 MG tablet Commonly known as: ARICEPT Take 5 mg by mouth at bedtime. LF 01/18/2023:Sold 01/31/2023 90 day supply.   escitalopram 20 MG tablet Commonly known as: LEXAPRO Take 20 mg by mouth every evening. LF 08/17/2022: Sold 08/25/2022 90 day supply.   feeding supplement Liqd Take 237 mLs by mouth 3  (three) times daily between meals.   ferrous sulfate 325 (65 FE) MG tablet Take 1 tablet (325 mg total) by mouth 2 (two) times daily with a meal.   folic acid 1 MG tablet Commonly known as: FOLVITE Take 1 tablet (1 mg total) by mouth daily. Start 7 days before pemetrexed chemotherapy. Continue until 21 days after pemetrexed completed. What changed: additional instructions   levothyroxine 50 MCG tablet Commonly known as: SYNTHROID Take 1 tablet (50 mcg total) by mouth daily before breakfast. Start taking on: March 28, 2023 What changed:  medication strength how much to take when to take this additional instructions   ondansetron 4 MG disintegrating tablet Commonly known as: ZOFRAN-ODT Take 4 mg by mouth 3 (three) times daily as needed for nausea or vomiting. LF 03/16/2023: Sold 03/16/2023 10 day supply.   pantoprazole 40 MG tablet Commonly known as: PROTONIX Take 40 mg by mouth daily. LF 03/01/2023: 03/03/2023 90 day supply.   QUEtiapine 25 MG tablet Commonly known as: SEROQUEL Take 1 tablet (25 mg total) by mouth 2 (two) times daily.   Stiolto Respimat 2.5-2.5 MCG/ACT Aers Generic drug: Tiotropium Bromide-Olodaterol Inhale 2 puffs into the lungs daily.   Symbicort 160-4.5 MCG/ACT inhaler Generic drug: budesonide-formoterol Inhale 1 puff into the lungs 2 (two) times daily. LF 03/01/2023: 03/03/2023 90 day supply.   VITAMIN B-12 PO Take 1 tablet by mouth daily.   VITAMIN D-3 PO Take 1 capsule by mouth daily.         Contact information for follow-up providers     Alysia Penna, MD. Schedule an appointment as soon as possible for a visit in 1 week(s).   Specialty: Internal Medicine Contact information: [address] [place] [address] [phone]              Contact information for after-discharge care     Destination     [facility] .   Service: Skilled Nursing Contact information: [address] [place]  [place] [address] [phone]                     Major procedures and Radiology Reports - PLEASE review detailed and final reports thoroughly  -        DG Chest [facility] 1 View  Result Date: 03/22/2023 CLINICAL DATA:  Short of breath. EXAM: PORTABLE CHEST 1 VIEW COMPARISON:  03/20/2023 and older studies. FINDINGS: Cardiac silhouette is normal in size. No mediastinal or hilar masses. Stable changes from previous right lung surgery. Prominent interstitial markings noted bilaterally. No lung consolidation or convincing pulmonary edema. No pleural effusion and no pneumothorax. Skeletal structures are grossly intact. IMPRESSION: 1. No acute cardiopulmonary disease. Electronically Signed   By: Amie Portland M.D.   On: 03/22/2023 08:26   CT CHEST ABDOMEN PELVIS WO CONTRAST  Result Date: 03/20/2023 CLINICAL DATA:  Sepsis No contrast. PT BIB [facility] after called to home by neighbors who had not seen or heard from her x3 days. PT was found on her floor, conscious but confused. Hx of cancer. EXAM: CT CHEST, ABDOMEN AND PELVIS WITHOUT CONTRAST TECHNIQUE: Multidetector CT imaging of the chest, abdomen and pelvis was performed following the standard protocol without IV contrast. RADIATION DOSE REDUCTION: This exam was performed according to the departmental dose-optimization program which includes automated exposure control, adjustment of the mA and/or kV according to patient size and/or use of iterative reconstruction technique. COMPARISON:  CT chest 09/01/2022, CT abdomen pelvis 04/14/2011 FINDINGS: CHEST: Cardiovascular: The thoracic aorta is normal in caliber. The heart is normal in size. No significant pericardial effusion. Moderate atherosclerotic plaque. Lungs/Pleura: Status post right upper lobectomy. Centrilobular emphysematous changes. Bilateral patchy ground-glass airspace. No pulmonary nodule. No pulmonary mass. No pulmonary contusion or laceration. No pneumatocele  formation. No pleural effusion. No pneumothorax. No hemothorax. Mediastinum/Nodes: No pneumomediastinum. The central airways are patent. The esophagus is unremarkable.  Small hiatal hernia. The thyroid is unremarkable. Limited evaluation for hilar lymphadenopathy on this noncontrast study. No mediastinal or axillary lymphadenopathy. Musculoskeletal/Chest wall No chest wall mass. No acute rib or sternal fracture. No acute spinal fracture. Similar-appearing concavity of the superior endplate of the T4 vertebral body. ABDOMEN / PELVIS: Hepatobiliary: Not enlarged. Vague hypodensity along the falciform ligament likely focal fatty infiltration. Subcentimeter hypodensities within the liver are chronic and likely representing simple hepatic cysts. The gallbladder is otherwise unremarkable with no radio-opaque gallstones. No biliary ductal dilatation. Pancreas: Normal pancreatic contour. No main pancreatic duct dilatation. Spleen: Not enlarged. No focal lesion. Adrenals/Urinary Tract: No nodularity bilaterally. No hydroureteronephrosis. No nephroureterolithiasis. No contour deforming renal mass. Air-fluid level within the urinary bladder lumen. Otherwise the urinary bladder is unremarkable. Stomach/Bowel: Right hemi colectomy. No small or large bowel wall thickening or dilatation. Colonic diverticulosis. The appendix is unremarkable. Vasculature/Lymphatic: Severe atherosclerotic plaque. No abdominal aorta or iliac aneurysm. No abdominal, pelvic, inguinal lymphadenopathy. Reproductive: Uterus and bilateral adnexal regions are unremarkable. Other: No simple free fluid ascites. No pneumoperitoneum. No mesenteric hematoma identified. No organized fluid collection. Musculoskeletal: No significant soft tissue hematoma. No acute pelvic fracture. No spinal fracture. Ports and Devices: None. IMPRESSION: 1. Bilateral patchy ground-glass airspace. Findings suggestive of infection/inflammation. 2. Air-fluid level within the urinary  bladder lumen-correlate for recent instrumentation. If no such finding, recommend urinalysis for evaluation of possible infection. 3. No acute intrathoracic, intra-abdominal, intrapelvic traumatic injury with limited evaluation due to noncontrast study. 4. No acute fracture or traumatic malalignment of the thoracic or lumbar spine. Other imaging findings of potential clinical significance: 1. Aortic  Atherosclerosis (ICD10-I70.0) and Emphysema (ICD10-J43.9). 2. Colonic diverticulosis with no acute diverticulitis. 3. Small hiatal hernia. 4. Status post right upper lobectomy and right hemicolectomy. Electronically Signed   By: Tish Frederickson M.D.   On: 03/20/2023 17:20   CT Head Wo Contrast  Result Date: 03/20/2023 CLINICAL DATA:  Head trauma, minor (Age >= 65y); Neck trauma (Age >= 65y) home by neighbors who had not seen or heard from her x3 days. PT was found on her floor, conscious but confused. Hx of cancer. ST on monitor, Hr 130 en route, T 97.7 EXAM: CT HEAD WITHOUT CONTRAST CT CERVICAL SPINE WITHOUT CONTRAST TECHNIQUE: Multidetector CT imaging of the head and cervical spine was performed following the standard protocol without intravenous contrast. Multiplanar CT image reconstructions of the cervical spine were also generated. RADIATION DOSE REDUCTION: This exam was performed according to the departmental dose-optimization program which includes automated exposure control, adjustment of the mA and/or kV according to patient size and/or use of iterative reconstruction technique. COMPARISON:  CT head and C-spine 10/15/2022 FINDINGS: CT HEAD FINDINGS Brain: Patchy and confluent areas of decreased attenuation are noted throughout the deep and periventricular white matter of the cerebral hemispheres bilaterally, compatible with chronic microvascular ischemic disease. No evidence of large-territorial acute infarction. No parenchymal hemorrhage. No mass lesion. No extra-axial collection. No mass effect or midline  shift. No hydrocephalus. Basilar cisterns are patent. Vascular: No hyperdense vessel. Atherosclerotic calcifications are present within the cavernous internal carotid arteries. Skull: No acute fracture or focal lesion. Sinuses/Orbits: Paranasal sinuses and mastoid air cells are clear. The orbits are unremarkable. Other: None. CT CERVICAL SPINE FINDINGS Alignment: Normal. Skull base and vertebrae: At least moderate degenerative change of the spine. Ligamentum flavum calcification. Osseous fusion of the C5 - C7 levels. Associated moderate severe osseous neural foraminal stenosis at the right C3-C4 levels. Associated severe osseous neural foraminal stenosis at the left C3-C4 and C4-C5 levels. No severe osseous central canal stenosis. No acute fracture with slightly limited evaluation due to motion artifact. No aggressive appearing focal osseous lesion or focal pathologic process. Soft tissues and spinal canal: No prevertebral fluid or swelling. No visible canal hematoma. Upper chest: Centrilobular emphysematous changes. Other: None. IMPRESSION: 1. No acute intracranial abnormality. 2. No acute displaced fracture or traumatic listhesis of the cervical spine. Slightly limited evaluation due to motion artifact. 3. 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 4.  Emphysema (ICD10-J43.9). Electronically Signed   By: Tish Frederickson M.D.   On: 03/20/2023 17:11   CT Cervical Spine Wo Contrast  Result Date: 03/20/2023 CLINICAL DATA:  Head trauma, minor (Age >= 65y); Neck trauma (Age >= 65y) home by neighbors who had not seen or heard from her x3 days. PT was found on her floor, conscious but confused. Hx of cancer. ST on monitor, Hr 130 en route, T 97.7 EXAM: CT HEAD WITHOUT CONTRAST CT CERVICAL SPINE WITHOUT CONTRAST TECHNIQUE: Multidetector CT imaging of the head and cervical spine was performed following the standard protocol without intravenous contrast. Multiplanar CT image  reconstructions of the cervical spine were also generated. RADIATION DOSE REDUCTION: This exam was performed according to the departmental dose-optimization program which includes automated exposure control, adjustment of the mA and/or kV according to patient size and/or use of iterative reconstruction technique. COMPARISON:  CT head and C-spine 10/15/2022 FINDINGS: CT HEAD FINDINGS Brain: Patchy and confluent areas of decreased attenuation are noted throughout the deep and periventricular white matter of the cerebral hemispheres bilaterally, compatible  with chronic microvascular ischemic disease. No evidence of large-territorial acute infarction. No parenchymal hemorrhage. No mass lesion. No extra-axial collection. No mass effect or midline shift. No hydrocephalus. Basilar cisterns are patent. Vascular: No hyperdense vessel. Atherosclerotic calcifications are present within the cavernous internal carotid arteries. Skull: No acute fracture or focal lesion. Sinuses/Orbits: Paranasal sinuses and mastoid air cells are clear. The orbits are unremarkable. Other: None. CT CERVICAL SPINE FINDINGS Alignment: Normal. Skull base and vertebrae: At least moderate degenerative change of the spine. Ligamentum flavum calcification. Osseous fusion of the C5 - C7 levels. Associated moderate severe osseous neural foraminal stenosis at the right C3-C4 levels. Associated severe osseous neural foraminal stenosis at the left C3-C4 and C4-C5 levels. No severe osseous central canal stenosis. No acute fracture with slightly limited evaluation due to motion artifact. No aggressive appearing focal osseous lesion or focal pathologic process. Soft tissues and spinal canal: No prevertebral fluid or swelling. No visible canal hematoma. Upper chest: Centrilobular emphysematous changes. Other: None. IMPRESSION: 1. No acute intracranial abnormality. 2. No acute displaced fracture or traumatic listhesis of the cervical spine. Slightly limited  evaluation due to motion artifact. 3. 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 4.  Emphysema (ICD10-J43.9). Electronically Signed   By: Tish Frederickson M.D.   On: 03/20/2023 17:11   DG Chest Port 1 View  Result Date: 03/20/2023 CLINICAL DATA:  Questionable sepsis EXAM: PORTABLE CHEST 1 VIEW COMPARISON:  Chest x-ray 10/15/2022 FINDINGS: Postsurgical changes in the medial right upper lobe appear stable from prior. Right apical pleural thickening is unchanged. There is no new focal lung infiltrate, pleural effusion or pneumothorax. The cardiac silhouette is within normal limits. IMPRESSION: 1. No active disease. 2. Stable postsurgical changes in the medial right upper lobe. Electronically Signed   By: Darliss Cheney M.D.   On: 03/20/2023 15:35     Today   Subjective    Makalie Bogue today has no headache,no chest abdominal pain,no new weakness tingling or numbness, feels much better     Objective   Blood pressure (!) 110/49, pulse 94, temperature 97.9 F (36.6 C), temperature source Oral, resp. rate 18, height 5\' 6"  (1.676 m), weight 57.8 kg, SpO2 98%.  No intake or output data in the 24 hours ending 03/27/23 0952  Exam  Awake but pleasantly confused, No new F.N deficits,    [place].AT,PERRAL Supple Neck,   Symmetrical Chest wall movement, Good air movement bilaterally, CTAB RRR,No Gallops,   +ve B.Sounds, Abd Soft, Non tender,  No Cyanosis, Clubbing or edema    Data Review   Recent Labs  Lab 03/20/23 1442 03/20/23 1456 03/21/23 0430 03/22/23 0030 03/22/23 0434 03/23/23 0425 03/24/23 0507 03/24/23 2150  WBC 19.6*  --  12.8*  --  7.9 8.6 9.0 8.1  HGB 14.0   < > 9.5* 8.1* 8.0* 8.3* 9.0* 8.8*  HCT 42.2   < > 29.7* 24.5* 25.5* 26.4* 27.4* 27.2*  PLT 360  --  249  --  196 204 223 220  MCV 78.3*  --  79.8*  --  81.0 80.2 78.5* 80.7  MCH 26.0  --  25.5*  --  25.4* 25.2* 25.8* 26.1  MCHC 33.2  --  32.0  --  31.4 31.4 32.8 32.4  RDW 17.5*  --   17.2*  --  17.8* 17.6* 17.7* 18.0*  LYMPHSABS 1.5  --   --   --  1.8 0.9 1.1  --   MONOABS 1.6*  --   --   --  0.6 0.6 0.6  --   EOSABS 0.0  --   --   --  0.3 0.1 0.2  --   BASOSABS 0.1  --   --   --  0.0 0.0 0.0  --    < > = values in this interval not displayed.    Recent Labs  Lab 03/20/23 1442 03/20/23 1445 03/20/23 1456 03/20/23 1749 03/20/23 2304 03/21/23 0151 03/21/23 0219 03/21/23 0225 03/21/23 0430 03/22/23 0434 03/23/23 0425 03/23/23 0502 03/24/23 0507 03/24/23 2150 03/25/23 0358 03/26/23 0421  NA 140  --  138  --   --   --  138  --   --  133* 134*  --  140 137 140 138  K 3.0*  --  2.9*  --   --   --  2.8*  --   --  3.4* 3.7  --  3.0* 3.6 4.4 4.1  CL 91*  --  94*  --   --   --  101  --   --  101 105  --  104 105 110 112*  CO2 29  --   --   --   --   --  27  --   --  24 26  --  24 22 22  19*  ANIONGAP 20*  --   --   --   --   --  10  --   --  8 3*  --  12 10 8 7   GLUCOSE 128*  --  128*  --   --   --  96  --   --  83 101*  --  83 125* 93 96  BUN 68*  --  58*  --   --   --  48*  --   --  25* 15  --  9 8 7* <5*  CREATININE 1.93*  --  2.00*  --   --   --  1.24*  --   --  0.81 0.85  --  0.78 0.85 0.86 0.82  AST 26  --   --   --   --   --  18  --   --   --   --   --   --   --   --   --   ALT 24  --   --   --   --   --  14  --   --   --   --   --   --   --   --   --   ALKPHOS 59  --   --   --   --   --  37*  --   --   --   --   --   --   --   --   --   BILITOT 1.1  --   --   --   --   --  1.0  --   --   --   --   --   --   --   --   --   ALBUMIN 3.2*  --   --   --   --   --  2.0*  --   --   --   --   --   --   --   --   --   CRP  --  14.5*  --   --   --   --   --   --   --  6.1* 3.4*  --  5.5*  --   --   --   PROCALCITON  --   --   --   --   --  0.10  --   --   --  <0.10 <0.10  --  <0.10  --   --   --   LATICACIDVEN  --   --  3.7* 3.3* 1.1  --   --  1.1  --   --   --   --   --   --   --   --   INR 1.0  --   --   --   --   --   --   --   --   --   --   --   --   --   --   --    TSH  --   --   --   --  0.022*  --   --   --   --  0.018*  --  0.058*  --   --   --   --   HGBA1C  --   --   --   --  5.3  --   --   --   --   --   --   --   --   --   --   --   BNP  --   --   --   --   --   --   --   --   --  409.4* 725.0*  --  609.6*  --   --   --   MG 2.2  --   --   --   --   --   --   --  1.7 1.9 1.9  --  1.6*  --  2.3  --   CALCIUM 10.3  --   --   --   --   --  8.2*  --   --  7.6* 7.6*  --  7.6* 7.7* 8.0* 8.2*    Total Time in preparing paper work, data evaluation and todays exam - 35 minutes  Signature  -    Susa Raring M.D on 03/27/2023 at 9:52 AM   -  To page go to [email or web address]

## 2023-03-27 NOTE — Plan of Care (Signed)
  Problem: Education: Goal: Knowledge of General Education information will improve Description: Including pain rating scale, medication(s)/side effects and non-pharmacologic comfort measures Outcome: Progressing   Problem: Health Behavior/Discharge Planning: Goal: Ability to manage health-related needs will improve Outcome: Progressing   Problem: Clinical Measurements: Goal: Ability to maintain clinical measurements within normal limits will improve Outcome: Progressing Goal: Will remain free from infection Outcome: Progressing Goal: Diagnostic test results will improve Outcome: Progressing Goal: Respiratory complications will improve Outcome: Progressing Goal: Cardiovascular complication will be avoided Outcome: Progressing   Problem: Activity: Goal: Risk for activity intolerance will decrease Outcome: Progressing   Problem: Nutrition: Goal: Adequate nutrition will be maintained Outcome: Progressing   Problem: Coping: Goal: Level of anxiety will decrease Outcome: Progressing   Problem: Elimination: Goal: Will not experience complications related to bowel motility Outcome: Progressing Goal: Will not experience complications related to urinary retention Outcome: Progressing   Problem: Pain Management: Goal: General experience of comfort will improve Outcome: Progressing   Problem: Safety: Goal: Ability to remain free from injury will improve Outcome: Progressing   Problem: Skin Integrity: Goal: Risk for impaired skin integrity will decrease Outcome: Progressing   Problem: Fluid Volume: Goal: Hemodynamic stability will improve Outcome: Progressing   Problem: Clinical Measurements: Goal: Diagnostic test results will improve Outcome: Progressing Goal: Signs and symptoms of infection will decrease Outcome: Progressing   Problem: Respiratory: Goal: Ability to maintain adequate ventilation will improve Outcome: Progressing   Problem: Safety: Goal:  Non-violent Restraint(s) Outcome: Progressing

## 2023-03-28 DIAGNOSIS — R6520 Severe sepsis without septic shock: Secondary | ICD-10-CM | POA: Diagnosis not present

## 2023-03-28 DIAGNOSIS — N179 Acute kidney failure, unspecified: Secondary | ICD-10-CM | POA: Diagnosis not present

## 2023-03-28 DIAGNOSIS — A419 Sepsis, unspecified organism: Secondary | ICD-10-CM | POA: Diagnosis not present

## 2023-03-28 NOTE — TOC Transition Note (Addendum)
Transition of Care [facility]) - CM/SW Discharge Note   Patient Details  Name: Andrea Morgan NEED MRN: [id number] Date of Birth: 12-26-1950  Transition of Care [facility]) CM/SW Contact:  Mearl Latin, LCSW Phone Number: 03/28/2023, 12:00 PM   Clinical Narrative:    [facility] Anticipated DC date: 03/28/23 Family notified: [name], sister (CSW left voicemail for patient's neighbor, Eunice Blase, at [place] request since pt is "mad at her sister". Legally, next of kin is reasonably available family, which has been sister, [name], this admission (and she has been communicating with their other sister as well). Per [name], patient is estranged from her daughter. Patient does not have the capacity to make her own decisions at this time due to confusion per MD.) Transport by: Sharin Mons   Per MD patient ready for DC to [facility]. RN to call report prior to discharge [phone] room 308a). RN, patient, patient's family, and facility notified of DC. Discharge Summary and FL2 sent to facility. DC packet on chart including signed script. Ambulance transport requested for patient.   CSW will sign off for now as social work intervention is no longer needed. Please consult [place] again if new needs arise.     Final next level of care: Skilled Nursing Facility Barriers to Discharge: Barriers Resolved   Patient Goals and CMS Choice CMS Medicare.gov Compare Post Acute Care list provided to:: Patient Represenative (must comment) Choice offered to / list presented to : Sibling  Discharge Placement     Existing PASRR number confirmed : 03/28/23          Patient chooses bed at: [facility] Patient to be transferred to facility by: [facility] Name of family member notified: [name], sister Patient and family notified of of transfer: 03/28/23  Discharge Plan and Services Additional resources added to the After Visit Summary for   In-house Referral: Clinical Social Work   Post Acute Care Choice:  Skilled Nursing Facility                               Social Determinants of Health (SDOH) Interventions SDOH Screenings   Food Insecurity: No Food Insecurity (03/07/2022)  Housing: Low Risk  (03/07/2022)  Transportation Needs: No Transportation Needs (03/07/2022)  Utilities: Not At Risk (03/07/2022)  Tobacco Use: Medium Risk (03/20/2023)     Readmission Risk Interventions     No data to display

## 2023-03-28 NOTE — Progress Notes (Signed)
[facility]                                                                                                                                                                         Patient Demographics  Andrea Morgan, is a 72 y.o. female  EXB:[id number]  [id number]:[id number]  DOB - [birth year]  Admit date - 03/20/2023  Admitting Physician Katha Cabal, DO  Outpatient Primary MD for the patient is Alysia Penna, MD  LOS - 8   Chief Complaint  Patient presents with   Fall   Weakness        Assessment & Plan    Patient seen briefly today due for discharge soon per Discharge done yesterday by  me, no further issues, Vital signs stable, patient feels fine, await SNF bed.     Medications  Scheduled Meds:  (feeding supplement) PROSource Plus  30 mL Oral BID BM   ALPRAZolam  0.5 mg Oral BID   arformoterol  15 mcg Nebulization BID   And   umeclidinium bromide  1 puff Inhalation Daily   aspirin EC  81 mg Oral Daily   atorvastatin  20 mg Oral QHS   buPROPion  150 mg Oral Daily   diltiazem  60 mg Oral Q12H   donepezil  5 mg Oral QHS   enoxaparin (LOVENOX) injection  40 mg Subcutaneous Daily   feeding supplement  237 mL Oral TID BM   ferrous sulfate  325 mg Oral BID WC   folic acid  1 mg Oral Daily   levothyroxine  50 mcg Oral Q0600   mometasone-formoterol  2 puff Inhalation BID   oxidized cellulose  1 each Topical Once   pantoprazole  40 mg Oral Daily   QUEtiapine  25 mg Oral Daily   QUEtiapine  50 mg Oral QHS   venlafaxine XR  37.5 mg Oral Q breakfast   Continuous Infusions: PRN Meds:.acetaminophen **OR** acetaminophen, albuterol, melatonin, metoprolol tartrate, [DISCONTINUED] ondansetron **OR** ondansetron (ZOFRAN) IV    Time Spent in minutes   10 minutes   Susa Raring M.D on 03/28/2023 at 10:13 AM  Between 7am to 7pm - Pager - [phone]  After 7pm go to [email or web address] - password  [email or web address]  And look for the night coverage person covering for me after hours  [facility] Office  [phone]    Subjective:   Carmilita Krzywicki today has, No headache, No chest pain, No abdominal pain - No Nausea, No new weakness tingling or numbness, No Cough - SOB.  Objective:   Vitals:   03/27/23 1237 03/28/23 0000 03/28/23 0400 03/28/23 0759  BP: (!) 114/47     Pulse:  82 79   Resp: 18 18 18    Temp:  98.3 F (36.8 C) 98 F (36.7 C) 98.2 F (36.8 C)   TempSrc: Oral Oral Oral   SpO2:  96% 97% 98%  Weight:      Height:        Wt Readings from Last 3 Encounters:  03/20/23 57.8 kg  01/31/23 65.6 kg  10/15/22 67.1 kg    No intake or output data in the 24 hours ending 03/28/23 1013  Exam  Awake but confused, No new F.N deficits, Normal affect [place].AT,PERRAL Supple Neck, No JVD,   Symmetrical Chest wall movement, Good air movement bilaterally, CTAB RRR,No Gallops, Rubs or new Murmurs,  +ve B.Sounds, Abd Soft, No tenderness,   No Cyanosis, Clubbing or edema   Data Review

## 2023-03-28 NOTE — TOC Progression Note (Addendum)
Transition of Care [facility]) - Progression Note    Patient Details  Name: GILDA UDELL MRN: [id number] Date of Birth: [birth year]  Transition [facility] Care [facility]) CM/SW Contact  Mearl Latin, LCSW Phone Number: 03/28/2023, 9:26 AM  Clinical Narrative:    [facility] confirmed [facility] today. CSW confirmed with insurance that approval is effective until midnight today. CSW updated patient's sister, [name].   Expected Discharge Plan: Skilled Nursing Facility Barriers to Discharge: Barriers resolved  Expected Discharge Plan and Services In-house Referral: Clinical Social Work   Post Acute Care Choice: Skilled Nursing Facility Living arrangements for the past 2 months: Apartment Expected Discharge Date: 03/27/23                                     Social Determinants of Health (SDOH) Interventions SDOH Screenings   Food Insecurity: No Food Insecurity (03/07/2022)  Housing: Low Risk  (03/07/2022)  Transportation Needs: No Transportation Needs (03/07/2022)  Utilities: Not At Risk (03/07/2022)  Tobacco Use: Medium Risk (03/20/2023)    Readmission Risk Interventions     No data to display

## 2023-03-28 NOTE — Progress Notes (Signed)
PT Cancellation Note  Patient Details Name: Andrea Morgan MRN: [id number] DOB: 1951-01-31   Cancelled Treatment:    Reason Eval/Treat Not Completed: (P) Fatigue/lethargy limiting ability to participate (Pt reports fatigue and declines mobility. Will follow up as schedule allows.)   Johny Shock 03/28/2023, 9:48 AM

## 2023-03-28 NOTE — TOC Progression Note (Signed)
Transition of Care [facility]) - Progression Note    Patient Details  Name: Andrea Morgan MRN: [id number] Date of Birth: 02-21-1951  Transition of Care [facility]) CM/SW Contact  Mearl Latin, LCSW Phone Number: 03/28/2023, 12:31 PM  Clinical Narrative:    CSW received return call from [place] and her husband who reported agreement with plan for [facility] as it is not far from them. They do not have POA for patient but are willing to assist her as needed. CSW provided info on contacting DSS for switching to long term Medicaid if needed in the future. They requested patient's keys and wallet be packed up from security. CSW made RN aware. They will visit patient in a day or two to help get her things and will touch base with [name] regarding patient's car and apartment bills.     Expected Discharge Plan: Skilled Nursing Facility Barriers to Discharge: Barriers Resolved  Expected Discharge Plan and Services In-house Referral: Clinical Social Work   Post Acute Care Choice: Skilled Nursing Facility Living arrangements for the past 2 months: Apartment Expected Discharge Date: 03/27/23                                     Social Determinants of Health (SDOH) Interventions SDOH Screenings   Food Insecurity: No Food Insecurity (03/07/2022)  Housing: Low Risk  (03/07/2022)  Transportation Needs: No Transportation Needs (03/07/2022)  Utilities: Not At Risk (03/07/2022)  Tobacco Use: Medium Risk (03/20/2023)    Readmission Risk Interventions     No data to display

## 2023-03-28 NOTE — Plan of Care (Signed)
  Problem: Education: Goal: Knowledge of General Education information will improve Description: Including pain rating scale, medication(s)/side effects and non-pharmacologic comfort measures Outcome: Progressing   Problem: Health Behavior/Discharge Planning: Goal: Ability to manage health-related needs will improve Outcome: Progressing   Problem: Clinical Measurements: Goal: Ability to maintain clinical measurements within normal limits will improve Outcome: Progressing Goal: Will remain free from infection Outcome: Progressing Goal: Diagnostic test results will improve Outcome: Progressing Goal: Respiratory complications will improve Outcome: Progressing Goal: Cardiovascular complication will be avoided Outcome: Progressing   Problem: Activity: Goal: Risk for activity intolerance will decrease Outcome: Progressing   Problem: Nutrition: Goal: Adequate nutrition will be maintained Outcome: Progressing   Problem: Coping: Goal: Level of anxiety will decrease Outcome: Progressing   Problem: Elimination: Goal: Will not experience complications related to bowel motility Outcome: Progressing Goal: Will not experience complications related to urinary retention Outcome: Progressing   Problem: Pain Management: Goal: General experience of comfort will improve Outcome: Progressing   Problem: Safety: Goal: Ability to remain free from injury will improve Outcome: Progressing   Problem: Skin Integrity: Goal: Risk for impaired skin integrity will decrease Outcome: Progressing   Problem: Fluid Volume: Goal: Hemodynamic stability will improve Outcome: Progressing   Problem: Clinical Measurements: Goal: Diagnostic test results will improve Outcome: Progressing Goal: Signs and symptoms of infection will decrease Outcome: Progressing   Problem: Respiratory: Goal: Ability to maintain adequate ventilation will improve Outcome: Progressing   Problem: Safety: Goal:  Non-violent Restraint(s) Outcome: Progressing

## 2023-03-31 ENCOUNTER — Telehealth: Payer: Self-pay | Admitting: Internal Medicine

## 2023-03-31 NOTE — H&P (View-Only) (Signed)
04/01/2023 Andrea Morgan [id number] May 22, 1950  Referring provider: Alysia Penna, MD Primary GI doctor: Dr. Rhea Belton  ASSESSMENT AND PLAN:   Dysphagia and Esophagitis Progressive dysphagia over the past year, worsening in the past month with associated pain.  History of 6 cm hiatal hernia and grade D esophagitis on EGD 2013.  Recent significant weight loss.  Oral thrush noted on examination, likely secondary to recent antibiotic use and inhaled steroids, raising concern for esophageal candidiasis. -Start Omeprazole 40mg  twice daily, open capsules and put in applesauce. -Start Carafate for mechanical coating of esophagus. -Start Diflucan for suspected esophageal candidiasis. -Plan for endoscopy to evaluate esophagus, will discuss with Dr. Rhea Belton time of EGD, patient is scheduled for colon at the hospital due to difficult intubation on 01/23, can see if we can add on to colon or if he feels may need sooner with CA history and significant weight loss to rule out colon cancer, though I'm hopeful the medications will help. - STOP fosamax, can worsen esophagitis - ER precautions discussed with patient  Anemia History of low hemoglobin going from 14 to 9, potentially worsened by recent inability to eat. - patient states not on iron but she has been getting twice a day per REC from [place] -Check hemoglobin and iron studies. -Consider IV iron if indicated, oral iron could be worsening symptoms.  History of TA polyps 09/30/2017 colonoscopy with Dr. Rhea Belton due to personal history of adenomatous polyp with high-grade dysplasia status post right hemicolectomy, good bowel prep And an ileocolonic anastomosis at hepatic flexure healthy-appearing mucosa, 6 mm polyp proximal transverse colon, diverticula recall 5 years. Patient has history of difficult intubation so scheduled direct admit to the hospital 05/12/2023  Thrush Diflucan sent in Nystatin wash sent in  PAF (paroxysmal  atrial fibrillation) (HCC) NSR, no anticoagulation at this time  Chronic obstructive pulmonary disease, unspecified COPD type (HCC) Not on oxygen, on symbicort, wash mouth afterwards  S/P Robotic Assisted Video Thoracosocpy with Right Upper Lobectomy  Malnutrition of moderate degree Suggest dietician/nutritional referral    Patient Care Team: Alysia Penna, MD as PCP - General (Internal Medicine) [name], MD as Consulting Physician (Pulmonary Disease)  HISTORY OF PRESENT ILLNESS: 72 y.o. 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 Shirline Frees, hiatal hernia, GERD, adrenal adenoma, spindle cell carcinoma of the thorax  and others listed below presents for evaluation of dysphagia.   05/21/2011 endoscopy with Dr. Elnoria Howard at [facility] for dysphagia after being diagnosed with right upper lobe mass Showed grade D esophagitis, 6 cm hiatal hernia no evidence of extrinsic compression or upper GI abnormality 09/30/2017 colonoscopy with Dr. Rhea Belton due to personal history of adenomatous polyp with high-grade dysplasia status post right hemicolectomy, good bowel prep And an ileocolonic anastomosis at hepatic flexure healthy-appearing mucosa, 6 mm polyp proximal transverse colon, diverticula recall 5 years. Patient has history of difficult intubation so scheduled direct admit to the hospital 05/12/2023 for repeat screening colonoscopy with Dr. Rhea Belton 03/20/23 through 03/28/2023 admitted to [facility] for weakness and fall diagnosed with sepsis secondary to UTI pneumonia treated with IV antibiotics. Unremarkable CT head, C-spine, chest abdomen pelvis. Thyroid also suppressed at the time instructed to hold Synthroid and follow-up with primary care. Patient was having severe odynophagia with solids greater than liquids shows narrow pharyngeal lumen diffuse erythema and edema, abnormal appearance of  protruding soft tissue lateral to right  epiglottic rim possible LPR with laryngeal inflammation.  Patient did have trace aspiration before/during swallow with thin liquids drinking intact sensation. Suggested follow-up with GI for suspected esophageal impairment/significant odynophagia, Carafate, also referred to ear nose and throat for protruding soft tissue appearance in right lateral channel.  Discussed the use of AI scribe software for clinical note transcription with the patient, who gave verbal consent to proceed.  History of Present Illness   The patient, with a known history of hiatal hernia, presents with progressive dysphagia and substernal burning sensation, which has been worsening since the beginning of the year. The discomfort is described as a burning sensation located where the esophagus meets the stomach, with food often getting stuck at this point. The patient reports a significant weight loss of 30 pounds over the past two to three weeks due to the inability to eat.  The patient also reports a recent hospital admission for sepsis and pneumonia, treated with IV antibiotics. Following this, the patient experienced a fall leading to mobility issues, necessitating the use of a wheelchair or walker.  The patient has a history of lung cancer and has been on a regimen of Symbicort and other unspecified medications. She reports no recent changes in medication. The patient denies any recent antibiotic use prior to the hospital admission, and denies any oral ulcers or thrush, although she had taken medication for oral health post-cancer treatment.  The patient's dysphagia has been progressively worsening since January, with the pain and food getting stuck becoming more consistent over the past month and a half. The patient reports occasional reflux, but denies any nausea, diarrhea, or blood in the stool. Bowel movements are reported as softer than normal, likely due to the current dietary  changes.  The patient has a history of esophagitis and a large hiatal hernia diagnosed over ten years ago, which was never repaired. The patient has been on a reflux medication, pantoprazole, once daily, and had self-increased the dose to twice daily for a period of three to four months when symptoms were particularly bothersome. The patient reports a history of smoking, but quit thirteen years ago.   She  reports that she quit smoking about 10 years ago. Her smoking use included cigarettes. She started smoking about 56 years ago. She has a 45 pack-year smoking history. She has never used smokeless tobacco. She reports current drug use. Drug: Marijuana. She reports that she does not drink alcohol.  RELEVANT LABS AND IMAGING:  Results   LABS Hb: 8.8 (03/20/2023) Iron: 35 (03/20/2023) Ferritin: 39 (03/20/2023)  DIAGNOSTIC Endoscopy: Grade D esophagitis, 6 cm hiatal hernia (2013)      CBC    Component Value Date/Time   WBC 8.1 03/24/2023 2150   RBC 3.37 (L) 03/24/2023 2150   HGB 8.8 (L) 03/24/2023 2150   HGB 11.4 (L) 01/03/2023 1443   HCT 27.2 (L) 03/24/2023 2150   PLT 220 03/24/2023 2150   PLT 397 01/03/2023 1443   MCV 80.7 03/24/2023 2150   MCH 26.1 03/24/2023 2150   MCHC 32.4 03/24/2023 2150   RDW 18.0 (H) 03/24/2023 2150   LYMPHSABS 1.1 03/24/2023 0507   MONOABS 0.6 03/24/2023 0507   EOSABS 0.2 03/24/2023 0507   BASOSABS 0.0 03/24/2023 0507   Recent Labs    10/15/22 1122 01/03/23 1443 03/20/23 1442 03/20/23 1456 03/21/23 0430 03/22/23 0030 03/22/23 0434 03/23/23 0425 03/24/23 0507 03/24/23 2150  HGB 11.6* 11.4* 14.0 14.6 9.5* 8.1* 8.0* 8.3* 9.0* 8.8*    CMP  Component Value Date/Time   NA 138 03/26/2023 0421   K 4.1 03/26/2023 0421   CL 112 (H) 03/26/2023 0421   CO2 19 (L) 03/26/2023 0421   GLUCOSE 96 03/26/2023 0421   BUN <5 (L) 03/26/2023 0421   CREATININE 0.82 03/26/2023 0421   CREATININE 1.07 (H) 01/03/2023 1443   CREATININE 0.71 04/23/2013 1443    CALCIUM 8.2 (L) 03/26/2023 0421   PROT 4.0 (L) 03/21/2023 0219   ALBUMIN 2.0 (L) 03/21/2023 0219   AST 18 03/21/2023 0219   AST 25 01/03/2023 1443   ALT 14 03/21/2023 0219   ALT 22 01/03/2023 1443   ALKPHOS 37 (L) 03/21/2023 0219   BILITOT 1.0 03/21/2023 0219   BILITOT 0.4 01/03/2023 1443   GFRNONAA >60 03/26/2023 0421   GFRNONAA 55 (L) 01/03/2023 1443   GFRAA >60 12/06/2019 1927      Latest Ref Rng & Units 03/21/2023    2:19 AM 03/20/2023    2:42 PM 01/03/2023    2:43 PM  Hepatic Function  Total Protein 6.5 - 8.1 g/dL 4.0  6.3  6.1   Albumin 3.5 - 5.0 g/dL 2.0  3.2  3.5   AST 15 - 41 U/L 18  26  25    ALT 0 - 44 U/L 14  24  22    Alk Phosphatase 38 - 126 U/L 37  59  50   Total Bilirubin <1.2 mg/dL 1.0  1.1  0.4       Current Medications:   Current Outpatient Medications (Endocrine & Metabolic):    alendronate (FOSAMAX) 70 MG tablet, Take 70 mg by mouth See admin instructions. 70 mg once a week on Thursday LF 01/01/2023: Sold 01/12/2023 84 day supply.   levothyroxine (SYNTHROID) 50 MCG tablet, Take 1 tablet (50 mcg total) by mouth daily before breakfast.  Current Outpatient Medications (Cardiovascular):    atorvastatin (LIPITOR) 20 MG tablet, Take 20 mg by mouth at bedtime. LF 12/27/2022: Sold 12/31/2022.   carvedilol (COREG) 3.125 MG tablet, Take 3.125 mg by mouth every morning. LF 10/05/2022: Sold 10/11/2022 15 day supply   diltiazem (CARDIZEM CD) 120 MG 24 hr capsule, TAKE 1 CAPSULE (120 MG TOTAL) BY MOUTH DAILY. (Patient taking differently: Take 120 mg by mouth daily. LF 01/18/2023: Sold 01/31/2023 90 day supply.)  Current Outpatient Medications (Respiratory):    albuterol (VENTOLIN HFA) 108 (90 Base) MCG/ACT inhaler, Inhale 2 puffs into the lungs every 6 (six) hours as needed for wheezing or shortness of breath. LF 05/20/2022: Sold 05/22/2022 33 day supply   SYMBICORT 160-4.5 MCG/ACT inhaler, Inhale 1 puff into the lungs 2 (two) times daily. LF 03/01/2023: 03/03/2023 90  day supply.   Tiotropium Bromide-Olodaterol (STIOLTO RESPIMAT) 2.5-2.5 MCG/ACT AERS, Inhale 2 puffs into the lungs daily.  Current Outpatient Medications (Analgesics):    acetaminophen (TYLENOL) 500 MG tablet, Take 500 mg by mouth every 8 (eight) hours as needed.   aspirin EC 81 MG tablet, Take 81 mg by mouth daily.  Current Outpatient Medications (Hematological):    ferrous sulfate 325 (65 FE) MG tablet, Take 1 tablet (325 mg total) by mouth 2 (two) times daily with a meal.   Cyanocobalamin (VITAMIN B-12 PO), Take 1 tablet by mouth daily. (Patient not taking: Reported on 03/21/2023)   folic acid (FOLVITE) 1 MG tablet, Take 1 tablet (1 mg total) by mouth daily. Start 7 days before pemetrexed chemotherapy. Continue until 21 days after pemetrexed completed. (Patient not taking: Reported on 04/01/2023)  Current Outpatient Medications (Other):  ASCORBIC ACID PO, Take 1 tablet by mouth daily. Vitamin C, unknown strength.   buPROPion (WELLBUTRIN SR) 150 MG 12 hr tablet, Take 150 mg by mouth daily. LF 12/27/2022: Sold 12/31/2022 90 day supply.   desvenlafaxine (PRISTIQ) 50 MG 24 hr tablet, Take 50 mg by mouth daily. LF 03/02/2023: Sold 03/03/2023 90 day supply.   donepezil (ARICEPT) 5 MG tablet, Take 5 mg by mouth at bedtime. LF 01/18/2023:Sold 01/31/2023 90 day supply.   escitalopram (LEXAPRO) 20 MG tablet, Take 20 mg by mouth every evening. LF 08/17/2022: Sold 08/25/2022 90 day supply.   omeprazole (PRILOSEC) 40 MG capsule, Open capsule and sprinkle on food twice daily   ondansetron (ZOFRAN-ODT) 4 MG disintegrating tablet, Take 4 mg by mouth 3 (three) times daily as needed for nausea or vomiting. LF 03/16/2023: Sold 03/16/2023 10 day supply.   QUEtiapine (SEROQUEL) 25 MG tablet, Take 1 tablet (25 mg total) by mouth 2 (two) times daily.   sucralfate (CARAFATE) 1 GM/10ML suspension, Take 10 mLs (1 g total) by mouth 4 (four) times daily -  with meals and at bedtime.   ALPRAZolam (XANAX) 0.5 MG tablet,  Take 1 tablet (0.5 mg total) by mouth 2 (two) times daily. (Patient not taking: Reported on 04/01/2023)   Cholecalciferol (VITAMIN D-3 PO), Take 1 capsule by mouth daily. (Patient not taking: Reported on 03/21/2023)   feeding supplement (ENSURE ENLIVE / ENSURE PLUS) LIQD, Take 237 mLs by mouth 3 (three) times daily between meals. (Patient not taking: Reported on 04/01/2023)   fluconazole (DIFLUCAN) 200 MG tablet, Take 2 tablets (400 mg total) by mouth daily for 1 day, THEN 1 tablet (200 mg total) daily for 13 days.  Medical History:  Past Medical History:  Diagnosis Date   ADHD (attention deficit hyperactivity disorder)    Adrenal adenoma    Anal condyloma    Anemia    Anxiety    Arthritis    arthritis left jaw / pain     Cancer (HCC) 2013   lung cancer   DDD (degenerative disc disease), thoracic    Depression    Dysphagia    Dyspnea    with exertion   Dyspnea on exertion    Emphysema/COPD (HCC)    GERD (gastroesophageal reflux disease)    Hiatal hernia    causes no problems per patient   History of adenomatous polyp of colon    06/ 2016 hyperplastic   History of cancer of lower lobe bronchus or lung followed by dr gerhardt-- lov 01/ 2017   06-02-2011  s/p  Right VATS w/ left mini thoracotomy resection 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)   History of condyloma acuminatum    removal anal canal condyloma 11-08-2013   History of kidney stones    passed stones   History of panic attacks    Hyperlipidemia    Hypertension    Hypothyroidism    Insomnia    Liver cyst    per CT in 2012   PAF (paroxysmal atrial fibrillation) [facility]) cardiologist-  dr Patty Sermons   single episode Atrial Fibrillation w/ RVR post-op day 3 the right colectomy surgery on 11-08-2013       Pneumonia    PONV (postoperative nausea and vomiting)    Pulmonary nodule, right    right upper lobe per last Chest CT 05-14-2015   Allergies:  Allergies  Allergen Reactions    Oxycontin [Oxycodone] Itching   Sulfa Antibiotics Hives   Morphine And Codeine Itching  Surgical History:  She  has a past surgical history that includes Cervical fusion (1997); Video bronchoscopy (05/11/2011); Esophagogastroduodenoscopy (05/21/2011); Video bronchoscopy (06/02/2011); Mass excision (06/02/2011); Laparoscopic partial colectomy (N/A, 11/08/2013); Examination under anesthesia (N/A, 11/08/2013); Colonoscopy (last one 10-17-2014); Thoracic discectomy (Right, 01/13/2016); transthoracic echocardiogram (08/28/2014); Tonsillectomy (1971); Appendectomy (1978); Mass excision (N/A, 05/06/2016); Intercostal nerve block (Right, 03/01/2022); and Node dissection (Right, 03/01/2022). Family History:  Her family history includes Asthma in her maternal grandmother and mother; Breast cancer in her maternal aunt; Cancer in her sister; Colon cancer in her brother; Emphysema in her sister; Heart disease in her mother; Kidney cancer in her sister; Liver cancer in her brother; Lung cancer in her father; Ovarian cancer in her maternal aunt; Prostate cancer in her maternal uncle.  REVIEW OF SYSTEMS  : All other systems reviewed and negative except where noted in the History of Present Illness.  PHYSICAL EXAM: BP (!) 106/58   Pulse 79   Ht 5\' 6"  (1.676 m)   Wt 127 lb 3 oz (57.7 kg)   SpO2 98%   BMI 20.53 kg/m  General Appearance: chronically ill appearing, NAD, in wheelchair Head:   Normocephalic and atraumatic. White plaques on post pharynx. Eyes:  sclerae anicteric,conjunctive pink  Respiratory: Respiratory effort normal, diffusely decreased BS without rales, rhonchi, wheezing. Cardio: RRR with systolic murmur.  Abdomen: Soft,  Obese ,active bowel sounds. No tenderness . Without guarding and Without rebound. No masses. Rectal: Not evaluated Musculoskeletal: Full ROM, Normal gait. Without edema. Skin:  Dry and intact without significant lesions or rashes Neuro: Alert and  oriented x4;  No focal  deficits. Psych:  Cooperative. Normal mood and affect.    Doree Albee, PA-C 2:30 PM

## 2023-03-31 NOTE — Telephone Encounter (Signed)
Patient is having lots of discomfort with swallowing from her throat to her stomach. She has lost weight. She has been placed on pureed foods. Fees test was done and findings showed what appears to be a questionable nodule in her throat. Pt scheduled to see [name] PA tomorrow at 2pm. Consuello Bossier, pts nurse, aware of appt.

## 2023-03-31 NOTE — Telephone Encounter (Signed)
Inbound call from [place], patient's nurse, requesting a call to discuss recent esophagus test results. States patient has severe pain when she swallows. Requesting a call at [phone]. Also stated she will fax over results for review. Please advise, thank you.

## 2023-03-31 NOTE — Progress Notes (Signed)
04/01/2023 Andrea Morgan [id number] May 22, 1950  Referring provider: Alysia Penna, MD Primary GI doctor: Dr. Rhea Belton  ASSESSMENT AND PLAN:   Dysphagia and Esophagitis Progressive dysphagia over the past year, worsening in the past month with associated pain.  History of 6 cm hiatal hernia and grade D esophagitis on EGD 2013.  Recent significant weight loss.  Oral thrush noted on examination, likely secondary to recent antibiotic use and inhaled steroids, raising concern for esophageal candidiasis. -Start Omeprazole 40mg  twice daily, open capsules and put in applesauce. -Start Carafate for mechanical coating of esophagus. -Start Diflucan for suspected esophageal candidiasis. -Plan for endoscopy to evaluate esophagus, will discuss with Dr. Rhea Belton time of EGD, patient is scheduled for colon at the hospital due to difficult intubation on 01/23, can see if we can add on to colon or if he feels may need sooner with CA history and significant weight loss to rule out colon cancer, though I'm hopeful the medications will help. - STOP fosamax, can worsen esophagitis - ER precautions discussed with patient  Anemia History of low hemoglobin going from 14 to 9, potentially worsened by recent inability to eat. - patient states not on iron but she has been getting twice a day per REC from [place] -Check hemoglobin and iron studies. -Consider IV iron if indicated, oral iron could be worsening symptoms.  History of TA polyps 09/30/2017 colonoscopy with Dr. Rhea Belton due to personal history of adenomatous polyp with high-grade dysplasia status post right hemicolectomy, good bowel prep And an ileocolonic anastomosis at hepatic flexure healthy-appearing mucosa, 6 mm polyp proximal transverse colon, diverticula recall 5 years. Patient has history of difficult intubation so scheduled direct admit to the hospital 05/12/2023  Thrush Diflucan sent in Nystatin wash sent in  PAF (paroxysmal  atrial fibrillation) (HCC) NSR, no anticoagulation at this time  Chronic obstructive pulmonary disease, unspecified COPD type (HCC) Not on oxygen, on symbicort, wash mouth afterwards  S/P Robotic Assisted Video Thoracosocpy with Right Upper Lobectomy  Malnutrition of moderate degree Suggest dietician/nutritional referral    Patient Care Team: Alysia Penna, MD as PCP - General (Internal Medicine) [name], MD as Consulting Physician (Pulmonary Disease)  HISTORY OF PRESENT ILLNESS: 72 y.o. 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 Shirline Frees, hiatal hernia, GERD, adrenal adenoma, spindle cell carcinoma of the thorax  and others listed below presents for evaluation of dysphagia.   05/21/2011 endoscopy with Dr. Elnoria Howard at [facility] for dysphagia after being diagnosed with right upper lobe mass Showed grade D esophagitis, 6 cm hiatal hernia no evidence of extrinsic compression or upper GI abnormality 09/30/2017 colonoscopy with Dr. Rhea Belton due to personal history of adenomatous polyp with high-grade dysplasia status post right hemicolectomy, good bowel prep And an ileocolonic anastomosis at hepatic flexure healthy-appearing mucosa, 6 mm polyp proximal transverse colon, diverticula recall 5 years. Patient has history of difficult intubation so scheduled direct admit to the hospital 05/12/2023 for repeat screening colonoscopy with Dr. Rhea Belton 03/20/23 through 03/28/2023 admitted to [facility] for weakness and fall diagnosed with sepsis secondary to UTI pneumonia treated with IV antibiotics. Unremarkable CT head, C-spine, chest abdomen pelvis. Thyroid also suppressed at the time instructed to hold Synthroid and follow-up with primary care. Patient was having severe odynophagia with solids greater than liquids shows narrow pharyngeal lumen diffuse erythema and edema, abnormal appearance of  protruding soft tissue lateral to right  epiglottic rim possible LPR with laryngeal inflammation.  Patient did have trace aspiration before/during swallow with thin liquids drinking intact sensation. Suggested follow-up with GI for suspected esophageal impairment/significant odynophagia, Carafate, also referred to ear nose and throat for protruding soft tissue appearance in right lateral channel.  Discussed the use of AI scribe software for clinical note transcription with the patient, who gave verbal consent to proceed.  History of Present Illness   The patient, with a known history of hiatal hernia, presents with progressive dysphagia and substernal burning sensation, which has been worsening since the beginning of the year. The discomfort is described as a burning sensation located where the esophagus meets the stomach, with food often getting stuck at this point. The patient reports a significant weight loss of 30 pounds over the past two to three weeks due to the inability to eat.  The patient also reports a recent hospital admission for sepsis and pneumonia, treated with IV antibiotics. Following this, the patient experienced a fall leading to mobility issues, necessitating the use of a wheelchair or walker.  The patient has a history of lung cancer and has been on a regimen of Symbicort and other unspecified medications. She reports no recent changes in medication. The patient denies any recent antibiotic use prior to the hospital admission, and denies any oral ulcers or thrush, although she had taken medication for oral health post-cancer treatment.  The patient's dysphagia has been progressively worsening since January, with the pain and food getting stuck becoming more consistent over the past month and a half. The patient reports occasional reflux, but denies any nausea, diarrhea, or blood in the stool. Bowel movements are reported as softer than normal, likely due to the current dietary  changes.  The patient has a history of esophagitis and a large hiatal hernia diagnosed over ten years ago, which was never repaired. The patient has been on a reflux medication, pantoprazole, once daily, and had self-increased the dose to twice daily for a period of three to four months when symptoms were particularly bothersome. The patient reports a history of smoking, but quit thirteen years ago.   She  reports that she quit smoking about 10 years ago. Her smoking use included cigarettes. She started smoking about 56 years ago. She has a 45 pack-year smoking history. She has never used smokeless tobacco. She reports current drug use. Drug: Marijuana. She reports that she does not drink alcohol.  RELEVANT LABS AND IMAGING:  Results   LABS Hb: 8.8 (03/20/2023) Iron: 35 (03/20/2023) Ferritin: 39 (03/20/2023)  DIAGNOSTIC Endoscopy: Grade D esophagitis, 6 cm hiatal hernia (2013)      CBC    Component Value Date/Time   WBC 8.1 03/24/2023 2150   RBC 3.37 (L) 03/24/2023 2150   HGB 8.8 (L) 03/24/2023 2150   HGB 11.4 (L) 01/03/2023 1443   HCT 27.2 (L) 03/24/2023 2150   PLT 220 03/24/2023 2150   PLT 397 01/03/2023 1443   MCV 80.7 03/24/2023 2150   MCH 26.1 03/24/2023 2150   MCHC 32.4 03/24/2023 2150   RDW 18.0 (H) 03/24/2023 2150   LYMPHSABS 1.1 03/24/2023 0507   MONOABS 0.6 03/24/2023 0507   EOSABS 0.2 03/24/2023 0507   BASOSABS 0.0 03/24/2023 0507   Recent Labs    10/15/22 1122 01/03/23 1443 03/20/23 1442 03/20/23 1456 03/21/23 0430 03/22/23 0030 03/22/23 0434 03/23/23 0425 03/24/23 0507 03/24/23 2150  HGB 11.6* 11.4* 14.0 14.6 9.5* 8.1* 8.0* 8.3* 9.0* 8.8*    CMP  Component Value Date/Time   NA 138 03/26/2023 0421   K 4.1 03/26/2023 0421   CL 112 (H) 03/26/2023 0421   CO2 19 (L) 03/26/2023 0421   GLUCOSE 96 03/26/2023 0421   BUN <5 (L) 03/26/2023 0421   CREATININE 0.82 03/26/2023 0421   CREATININE 1.07 (H) 01/03/2023 1443   CREATININE 0.71 04/23/2013 1443    CALCIUM 8.2 (L) 03/26/2023 0421   PROT 4.0 (L) 03/21/2023 0219   ALBUMIN 2.0 (L) 03/21/2023 0219   AST 18 03/21/2023 0219   AST 25 01/03/2023 1443   ALT 14 03/21/2023 0219   ALT 22 01/03/2023 1443   ALKPHOS 37 (L) 03/21/2023 0219   BILITOT 1.0 03/21/2023 0219   BILITOT 0.4 01/03/2023 1443   GFRNONAA >60 03/26/2023 0421   GFRNONAA 55 (L) 01/03/2023 1443   GFRAA >60 12/06/2019 1927      Latest Ref Rng & Units 03/21/2023    2:19 AM 03/20/2023    2:42 PM 01/03/2023    2:43 PM  Hepatic Function  Total Protein 6.5 - 8.1 g/dL 4.0  6.3  6.1   Albumin 3.5 - 5.0 g/dL 2.0  3.2  3.5   AST 15 - 41 U/L 18  26  25    ALT 0 - 44 U/L 14  24  22    Alk Phosphatase 38 - 126 U/L 37  59  50   Total Bilirubin <1.2 mg/dL 1.0  1.1  0.4       Current Medications:   Current Outpatient Medications (Endocrine & Metabolic):    alendronate (FOSAMAX) 70 MG tablet, Take 70 mg by mouth See admin instructions. 70 mg once a week on Thursday LF 01/01/2023: Sold 01/12/2023 84 day supply.   levothyroxine (SYNTHROID) 50 MCG tablet, Take 1 tablet (50 mcg total) by mouth daily before breakfast.  Current Outpatient Medications (Cardiovascular):    atorvastatin (LIPITOR) 20 MG tablet, Take 20 mg by mouth at bedtime. LF 12/27/2022: Sold 12/31/2022.   carvedilol (COREG) 3.125 MG tablet, Take 3.125 mg by mouth every morning. LF 10/05/2022: Sold 10/11/2022 15 day supply   diltiazem (CARDIZEM CD) 120 MG 24 hr capsule, TAKE 1 CAPSULE (120 MG TOTAL) BY MOUTH DAILY. (Patient taking differently: Take 120 mg by mouth daily. LF 01/18/2023: Sold 01/31/2023 90 day supply.)  Current Outpatient Medications (Respiratory):    albuterol (VENTOLIN HFA) 108 (90 Base) MCG/ACT inhaler, Inhale 2 puffs into the lungs every 6 (six) hours as needed for wheezing or shortness of breath. LF 05/20/2022: Sold 05/22/2022 33 day supply   SYMBICORT 160-4.5 MCG/ACT inhaler, Inhale 1 puff into the lungs 2 (two) times daily. LF 03/01/2023: 03/03/2023 90  day supply.   Tiotropium Bromide-Olodaterol (STIOLTO RESPIMAT) 2.5-2.5 MCG/ACT AERS, Inhale 2 puffs into the lungs daily.  Current Outpatient Medications (Analgesics):    acetaminophen (TYLENOL) 500 MG tablet, Take 500 mg by mouth every 8 (eight) hours as needed.   aspirin EC 81 MG tablet, Take 81 mg by mouth daily.  Current Outpatient Medications (Hematological):    ferrous sulfate 325 (65 FE) MG tablet, Take 1 tablet (325 mg total) by mouth 2 (two) times daily with a meal.   Cyanocobalamin (VITAMIN B-12 PO), Take 1 tablet by mouth daily. (Patient not taking: Reported on 03/21/2023)   folic acid (FOLVITE) 1 MG tablet, Take 1 tablet (1 mg total) by mouth daily. Start 7 days before pemetrexed chemotherapy. Continue until 21 days after pemetrexed completed. (Patient not taking: Reported on 04/01/2023)  Current Outpatient Medications (Other):  ASCORBIC ACID PO, Take 1 tablet by mouth daily. Vitamin C, unknown strength.   buPROPion (WELLBUTRIN SR) 150 MG 12 hr tablet, Take 150 mg by mouth daily. LF 12/27/2022: Sold 12/31/2022 90 day supply.   desvenlafaxine (PRISTIQ) 50 MG 24 hr tablet, Take 50 mg by mouth daily. LF 03/02/2023: Sold 03/03/2023 90 day supply.   donepezil (ARICEPT) 5 MG tablet, Take 5 mg by mouth at bedtime. LF 01/18/2023:Sold 01/31/2023 90 day supply.   escitalopram (LEXAPRO) 20 MG tablet, Take 20 mg by mouth every evening. LF 08/17/2022: Sold 08/25/2022 90 day supply.   omeprazole (PRILOSEC) 40 MG capsule, Open capsule and sprinkle on food twice daily   ondansetron (ZOFRAN-ODT) 4 MG disintegrating tablet, Take 4 mg by mouth 3 (three) times daily as needed for nausea or vomiting. LF 03/16/2023: Sold 03/16/2023 10 day supply.   QUEtiapine (SEROQUEL) 25 MG tablet, Take 1 tablet (25 mg total) by mouth 2 (two) times daily.   sucralfate (CARAFATE) 1 GM/10ML suspension, Take 10 mLs (1 g total) by mouth 4 (four) times daily -  with meals and at bedtime.   ALPRAZolam (XANAX) 0.5 MG tablet,  Take 1 tablet (0.5 mg total) by mouth 2 (two) times daily. (Patient not taking: Reported on 04/01/2023)   Cholecalciferol (VITAMIN D-3 PO), Take 1 capsule by mouth daily. (Patient not taking: Reported on 03/21/2023)   feeding supplement (ENSURE ENLIVE / ENSURE PLUS) LIQD, Take 237 mLs by mouth 3 (three) times daily between meals. (Patient not taking: Reported on 04/01/2023)   fluconazole (DIFLUCAN) 200 MG tablet, Take 2 tablets (400 mg total) by mouth daily for 1 day, THEN 1 tablet (200 mg total) daily for 13 days.  Medical History:  Past Medical History:  Diagnosis Date   ADHD (attention deficit hyperactivity disorder)    Adrenal adenoma    Anal condyloma    Anemia    Anxiety    Arthritis    arthritis left jaw / pain     Cancer (HCC) 2013   lung cancer   DDD (degenerative disc disease), thoracic    Depression    Dysphagia    Dyspnea    with exertion   Dyspnea on exertion    Emphysema/COPD (HCC)    GERD (gastroesophageal reflux disease)    Hiatal hernia    causes no problems per patient   History of adenomatous polyp of colon    06/ 2016 hyperplastic   History of cancer of lower lobe bronchus or lung followed by dr gerhardt-- lov 01/ 2017   06-02-2011  s/p  Right VATS w/ left mini thoracotomy resection 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)   History of condyloma acuminatum    removal anal canal condyloma 11-08-2013   History of kidney stones    passed stones   History of panic attacks    Hyperlipidemia    Hypertension    Hypothyroidism    Insomnia    Liver cyst    per CT in 2012   PAF (paroxysmal atrial fibrillation) [facility]) cardiologist-  dr Patty Sermons   single episode Atrial Fibrillation w/ RVR post-op day 3 the right colectomy surgery on 11-08-2013       Pneumonia    PONV (postoperative nausea and vomiting)    Pulmonary nodule, right    right upper lobe per last Chest CT 05-14-2015   Allergies:  Allergies  Allergen Reactions    Oxycontin [Oxycodone] Itching   Sulfa Antibiotics Hives   Morphine And Codeine Itching  Surgical History:  She  has a past surgical history that includes Cervical fusion (1997); Video bronchoscopy (05/11/2011); Esophagogastroduodenoscopy (05/21/2011); Video bronchoscopy (06/02/2011); Mass excision (06/02/2011); Laparoscopic partial colectomy (N/A, 11/08/2013); Examination under anesthesia (N/A, 11/08/2013); Colonoscopy (last one 10-17-2014); Thoracic discectomy (Right, 01/13/2016); transthoracic echocardiogram (08/28/2014); Tonsillectomy (1971); Appendectomy (1978); Mass excision (N/A, 05/06/2016); Intercostal nerve block (Right, 03/01/2022); and Node dissection (Right, 03/01/2022). Family History:  Her family history includes Asthma in her maternal grandmother and mother; Breast cancer in her maternal aunt; Cancer in her sister; Colon cancer in her brother; Emphysema in her sister; Heart disease in her mother; Kidney cancer in her sister; Liver cancer in her brother; Lung cancer in her father; Ovarian cancer in her maternal aunt; Prostate cancer in her maternal uncle.  REVIEW OF SYSTEMS  : All other systems reviewed and negative except where noted in the History of Present Illness.  PHYSICAL EXAM: BP (!) 106/58   Pulse 79   Ht 5\' 6"  (1.676 m)   Wt 127 lb 3 oz (57.7 kg)   SpO2 98%   BMI 20.53 kg/m  General Appearance: chronically ill appearing, NAD, in wheelchair Head:   Normocephalic and atraumatic. White plaques on post pharynx. Eyes:  sclerae anicteric,conjunctive pink  Respiratory: Respiratory effort normal, diffusely decreased BS without rales, rhonchi, wheezing. Cardio: RRR with systolic murmur.  Abdomen: Soft,  Obese ,active bowel sounds. No tenderness . Without guarding and Without rebound. No masses. Rectal: Not evaluated Musculoskeletal: Full ROM, Normal gait. Without edema. Skin:  Dry and intact without significant lesions or rashes Neuro: Alert and  oriented x4;  No focal  deficits. Psych:  Cooperative. Normal mood and affect.    Doree Albee, PA-C 2:30 PM

## 2023-03-31 NOTE — Telephone Encounter (Signed)
FEES test result given to [name] PA.

## 2023-04-01 ENCOUNTER — Encounter: Payer: Self-pay | Admitting: Physician Assistant

## 2023-04-01 ENCOUNTER — Other Ambulatory Visit (INDEPENDENT_AMBULATORY_CARE_PROVIDER_SITE_OTHER): Payer: 59

## 2023-04-01 ENCOUNTER — Ambulatory Visit: Payer: 59 | Admitting: Physician Assistant

## 2023-04-01 VITALS — BP 106/58 | HR 79 | Ht 66.0 in | Wt 127.2 lb

## 2023-04-01 DIAGNOSIS — B370 Candidal stomatitis: Secondary | ICD-10-CM

## 2023-04-01 DIAGNOSIS — R634 Abnormal weight loss: Secondary | ICD-10-CM | POA: Diagnosis not present

## 2023-04-01 DIAGNOSIS — I480 Paroxysmal atrial fibrillation: Secondary | ICD-10-CM

## 2023-04-01 DIAGNOSIS — R1310 Dysphagia, unspecified: Secondary | ICD-10-CM

## 2023-04-01 DIAGNOSIS — K449 Diaphragmatic hernia without obstruction or gangrene: Secondary | ICD-10-CM

## 2023-04-01 DIAGNOSIS — Z860101 Personal history of adenomatous and serrated colon polyps: Secondary | ICD-10-CM

## 2023-04-01 DIAGNOSIS — E440 Moderate protein-calorie malnutrition: Secondary | ICD-10-CM

## 2023-04-01 DIAGNOSIS — Z902 Acquired absence of lung [part of]: Secondary | ICD-10-CM

## 2023-04-01 DIAGNOSIS — K209 Esophagitis, unspecified without bleeding: Secondary | ICD-10-CM | POA: Diagnosis not present

## 2023-04-01 DIAGNOSIS — J449 Chronic obstructive pulmonary disease, unspecified: Secondary | ICD-10-CM

## 2023-04-01 DIAGNOSIS — D649 Anemia, unspecified: Secondary | ICD-10-CM

## 2023-04-01 DIAGNOSIS — R1319 Other dysphagia: Secondary | ICD-10-CM

## 2023-04-01 DIAGNOSIS — Z860100 Personal history of colon polyps, unspecified: Secondary | ICD-10-CM

## 2023-04-01 LAB — CBC WITH DIFFERENTIAL/PLATELET
Basophils Absolute: 0.1 10*3/uL (ref 0.0–0.1)
Basophils Relative: 0.8 % (ref 0.0–3.0)
Eosinophils Absolute: 0.1 10*3/uL (ref 0.0–0.7)
Eosinophils Relative: 1.2 % (ref 0.0–5.0)
HCT: 34 % — ABNORMAL LOW (ref 36.0–46.0)
Hemoglobin: 11.3 g/dL — ABNORMAL LOW (ref 12.0–15.0)
Lymphocytes Relative: 25 % (ref 12.0–46.0)
Lymphs Abs: 1.8 10*3/uL (ref 0.7–4.0)
MCHC: 33.2 g/dL (ref 30.0–36.0)
MCV: 80.2 fL (ref 78.0–100.0)
Monocytes Absolute: 0.4 10*3/uL (ref 0.1–1.0)
Monocytes Relative: 5.6 % (ref 3.0–12.0)
Neutro Abs: 4.8 10*3/uL (ref 1.4–7.7)
Neutrophils Relative %: 67.4 % (ref 43.0–77.0)
Platelets: 544 10*3/uL — ABNORMAL HIGH (ref 150.0–400.0)
RBC: 4.23 Mil/uL (ref 3.87–5.11)
RDW: 19.4 % — ABNORMAL HIGH (ref 11.5–15.5)
WBC: 7.1 10*3/uL (ref 4.0–10.5)

## 2023-04-01 LAB — IBC + FERRITIN
Ferritin: 81.1 ng/mL (ref 10.0–291.0)
Iron: 67 ug/dL (ref 42–145)
Saturation Ratios: 22.4 % (ref 20.0–50.0)
TIBC: 299.6 ug/dL (ref 250.0–450.0)
Transferrin: 214 mg/dL (ref 212.0–360.0)

## 2023-04-01 MED ORDER — FLUCONAZOLE 200 MG PO TABS: ORAL_TABLET | ORAL | 0 refills | Status: DC

## 2023-04-01 MED ORDER — FLUCONAZOLE 200 MG PO TABS
ORAL_TABLET | ORAL | 0 refills | Status: AC
Start: 2023-04-01 — End: 2023-04-15

## 2023-04-01 MED ORDER — NYSTATIN 100000 UNIT/ML MT SUSP: 5.0000 mL | Freq: Four times a day (QID) | OROMUCOSAL | 1 refills | Status: AC

## 2023-04-01 MED ORDER — OMEPRAZOLE 40 MG PO CPDR: DELAYED_RELEASE_CAPSULE | ORAL | 3 refills | Status: AC

## 2023-04-01 MED ORDER — SUCRALFATE 1 GM/10ML PO SUSP: 1.0000 g | Freq: Three times a day (TID) | ORAL | 0 refills | Status: AC

## 2023-04-01 NOTE — Patient Instructions (Addendum)
Your provider has requested that you go to the basement level for lab work before leaving today. Press "B" on the elevator. The lab is located at the first door on the left as you exit the elevator.  Suspicious for esophageal candidiasis, will treat with diflucan Stop the pantoprazole, start on omeprazole, open capsules put in food twice a day Carafate do before meals 3 x a day and at bed time.   Dysphagia precautions:  1. Take reflux medications 30+ minutes before food in the morning 2. Begin meals with warm beverage 3. Eat smaller more frequent meals 4. Eat slowly, taking small bites and sips 5. Alternate solids and liquids 6. Avoid foods/liquids that increase acid production 7. Sit upright during and for 30+ minutes after meals to facilitate esophageal clearing 8. All meats should be chopped finely.   If something gets hung in your esophagus and will not come up or go down, proceed to the emergency room.    I appreciate the opportunity to care for you. [name], PA-C

## 2023-04-04 ENCOUNTER — Other Ambulatory Visit: Payer: Self-pay

## 2023-04-04 DIAGNOSIS — R634 Abnormal weight loss: Secondary | ICD-10-CM

## 2023-04-04 DIAGNOSIS — R1319 Other dysphagia: Secondary | ICD-10-CM

## 2023-04-04 DIAGNOSIS — R1310 Dysphagia, unspecified: Secondary | ICD-10-CM

## 2023-04-04 NOTE — Addendum Note (Signed)
Addended by: Selinda Michaels R on: 04/04/2023 11:51 AM   Modules accepted: Orders

## 2023-04-04 NOTE — Progress Notes (Addendum)
Andrea Folks do you know if the patient knows about this appt? Someone put the orders in for the procedure already, not sure who. I have tried to reach pt to make sure she is aware of appt and knows to be NPO after midnight but have not been able to reach her. Please let me know.  Orders entered for EGD at [facility] for 04/07/23 at 9:30am. Pt to arrive there at 8am. Pt to be NPO after midnight. Spoke with Consuello Bossier at [facility] at [phone], she is aware of procedure appt and prep. Amb ref in epic.

## 2023-04-04 NOTE — Progress Notes (Signed)
Addendum: Reviewed and agree with assessment and management plan. I can add her on as an outpatient during the hospital time this week.  This will be this Thursday, 04/07/2023 at [facility] around 9:30 AM. Please place orders for the same and notify the patient N.p.o. after midnight Continue current treatment recommended by Berlinda Last, PA-C Patient should keep appointment for colonoscopy in January as scheduled Kenndra Morris, Carie Caddy, MD

## 2023-04-07 ENCOUNTER — Ambulatory Visit (HOSPITAL_COMMUNITY): Admit: 2023-04-07 | Payer: 59 | Admitting: Internal Medicine

## 2023-04-07 ENCOUNTER — Other Ambulatory Visit: Payer: Self-pay

## 2023-04-07 ENCOUNTER — Ambulatory Visit (HOSPITAL_COMMUNITY): Payer: 59 | Admitting: Anesthesiology

## 2023-04-07 ENCOUNTER — Ambulatory Visit (HOSPITAL_COMMUNITY)
Admission: RE | Admit: 2023-04-07 | Discharge: 2023-04-07 | Disposition: A | Discharge status: Skilled Nursing Facility | Payer: 59 | Source: Ambulatory Visit | Attending: Internal Medicine | Admitting: Internal Medicine

## 2023-04-07 ENCOUNTER — Encounter (HOSPITAL_COMMUNITY): Payer: Self-pay

## 2023-04-07 ENCOUNTER — Encounter (HOSPITAL_COMMUNITY)
Admission: RE | Disposition: A | Discharge status: Skilled Nursing Facility | Payer: Self-pay | Source: Ambulatory Visit | Attending: Internal Medicine

## 2023-04-07 ENCOUNTER — Encounter (HOSPITAL_COMMUNITY): Payer: Self-pay | Admitting: Internal Medicine

## 2023-04-07 DIAGNOSIS — E440 Moderate protein-calorie malnutrition: Secondary | ICD-10-CM | POA: Insufficient documentation

## 2023-04-07 DIAGNOSIS — Z860101 Personal history of adenomatous and serrated colon polyps: Secondary | ICD-10-CM | POA: Diagnosis not present

## 2023-04-07 DIAGNOSIS — K2100 Gastro-esophageal reflux disease with esophagitis, without bleeding: Secondary | ICD-10-CM | POA: Diagnosis not present

## 2023-04-07 DIAGNOSIS — J449 Chronic obstructive pulmonary disease, unspecified: Secondary | ICD-10-CM | POA: Insufficient documentation

## 2023-04-07 DIAGNOSIS — R1310 Dysphagia, unspecified: Secondary | ICD-10-CM

## 2023-04-07 DIAGNOSIS — Z860100 Personal history of colon polyps, unspecified: Secondary | ICD-10-CM

## 2023-04-07 DIAGNOSIS — M1990 Unspecified osteoarthritis, unspecified site: Secondary | ICD-10-CM | POA: Insufficient documentation

## 2023-04-07 DIAGNOSIS — K2210 Ulcer of esophagus without bleeding: Secondary | ICD-10-CM | POA: Insufficient documentation

## 2023-04-07 DIAGNOSIS — E039 Hypothyroidism, unspecified: Secondary | ICD-10-CM | POA: Insufficient documentation

## 2023-04-07 DIAGNOSIS — D175 Benign lipomatous neoplasm of intra-abdominal organs: Secondary | ICD-10-CM

## 2023-04-07 DIAGNOSIS — I480 Paroxysmal atrial fibrillation: Secondary | ICD-10-CM | POA: Diagnosis not present

## 2023-04-07 DIAGNOSIS — D649 Anemia, unspecified: Secondary | ICD-10-CM | POA: Diagnosis not present

## 2023-04-07 DIAGNOSIS — Z85118 Personal history of other malignant neoplasm of bronchus and lung: Secondary | ICD-10-CM | POA: Diagnosis not present

## 2023-04-07 DIAGNOSIS — Z902 Acquired absence of lung [part of]: Secondary | ICD-10-CM | POA: Insufficient documentation

## 2023-04-07 DIAGNOSIS — K449 Diaphragmatic hernia without obstruction or gangrene: Secondary | ICD-10-CM | POA: Diagnosis not present

## 2023-04-07 DIAGNOSIS — K3189 Other diseases of stomach and duodenum: Secondary | ICD-10-CM | POA: Diagnosis not present

## 2023-04-07 DIAGNOSIS — R1319 Other dysphagia: Secondary | ICD-10-CM

## 2023-04-07 DIAGNOSIS — F418 Other specified anxiety disorders: Secondary | ICD-10-CM | POA: Insufficient documentation

## 2023-04-07 DIAGNOSIS — Z87891 Personal history of nicotine dependence: Secondary | ICD-10-CM | POA: Insufficient documentation

## 2023-04-07 DIAGNOSIS — Z8249 Family history of ischemic heart disease and other diseases of the circulatory system: Secondary | ICD-10-CM | POA: Diagnosis not present

## 2023-04-07 DIAGNOSIS — B370 Candidal stomatitis: Secondary | ICD-10-CM | POA: Diagnosis not present

## 2023-04-07 DIAGNOSIS — I10 Essential (primary) hypertension: Secondary | ICD-10-CM | POA: Insufficient documentation

## 2023-04-07 DIAGNOSIS — Z9049 Acquired absence of other specified parts of digestive tract: Secondary | ICD-10-CM | POA: Diagnosis not present

## 2023-04-07 DIAGNOSIS — R634 Abnormal weight loss: Secondary | ICD-10-CM

## 2023-04-07 DIAGNOSIS — D1779 Benign lipomatous neoplasm of other sites: Secondary | ICD-10-CM | POA: Diagnosis not present

## 2023-04-07 DIAGNOSIS — Z801 Family history of malignant neoplasm of trachea, bronchus and lung: Secondary | ICD-10-CM | POA: Diagnosis not present

## 2023-04-07 HISTORY — PX: BIOPSY: SHX5522

## 2023-04-07 HISTORY — PX: ESOPHAGOGASTRODUODENOSCOPY (EGD) WITH PROPOFOL: SHX5813

## 2023-04-07 SURGERY — ESOPHAGOGASTRODUODENOSCOPY (EGD) WITH PROPOFOL
Anesthesia: Monitor Anesthesia Care

## 2023-04-07 MED ORDER — SODIUM CHLORIDE 0.9 % IV SOLN: INTRAVENOUS | Status: DC

## 2023-04-07 MED ORDER — PROPOFOL 10 MG/ML IV BOLUS
INTRAVENOUS | Status: DC | PRN
  Administered 2023-04-07 (×2): 20 mg via INTRAVENOUS
  Administered 2023-04-07: 70 mg via INTRAVENOUS
  Administered 2023-04-07 (×2): 20 mg via INTRAVENOUS

## 2023-04-07 MED ORDER — LIDOCAINE 2% (20 MG/ML) 5 ML SYRINGE
INTRAMUSCULAR | Status: DC | PRN
  Administered 2023-04-07: 40 mg via INTRAVENOUS

## 2023-04-07 MED ORDER — ONDANSETRON HCL 4 MG/2ML IJ SOLN
INTRAMUSCULAR | Status: DC | PRN
  Administered 2023-04-07: 4 mg via INTRAVENOUS

## 2023-04-07 SURGICAL SUPPLY — 14 items

## 2023-04-07 NOTE — Interval H&P Note (Signed)
History and Physical Interval Note: For for EGD in the outpatient hospital setting today to evaluate dysphagia, history of esophagitis, weight loss She reports significant improvement since starting medications last week including PPI She remains in rehab recovering after a fall The nature of the procedure, as well as the risks, benefits, and alternatives were carefully and thoroughly reviewed with the patient. Ample time for discussion and questions allowed. The patient understood, was satisfied, and agreed to proceed.   04/07/2023 9:35 AM  Andrea Morgan  has presented today for surgery, with the diagnosis of Esophageal nodule.  The various methods of treatment have been discussed with the patient and family. After consideration of risks, benefits and other options for treatment, the patient has consented to  Procedure(s): ESOPHAGOGASTRODUODENOSCOPY (EGD) WITH PROPOFOL (N/A) as a surgical intervention.  The patient's history has been reviewed, patient examined, no change in status, stable for surgery.  I have reviewed the patient's chart and labs.  Questions were answered to the patient's satisfaction.     Carie Caddy Ronny Korff

## 2023-04-07 NOTE — Op Note (Signed)
[facility] Patient Name: Andrea Morgan Procedure Date : 04/07/2023 MRN: [id number] Attending MD: Beverley Fiedler , MD, [id number] Date of Birth: April 14, 1951 CSN: [id number] Age: 72 Admit Type: Outpatient Procedure:                Upper GI endoscopy Indications:              Dysphagia, Reflux esophagitis, Weight loss, acute                            on chronic anemia -- pt reports significant                            improvement with omeprazole 40 mg BID, fluconazole                            and sucralfate Providers:                Carie Caddy. Rhea Belton, MD, Fransisca Connors, Geoffery Lyons, Technician Referring MD:             Alysia Penna Medicines:                Monitored Anesthesia Care Complications:            No immediate complications. Estimated Blood Loss:     Estimated blood loss was minimal. Procedure:                Pre-Anesthesia Assessment:                           - Prior to the procedure, a History and Physical                            was performed, and patient medications and                            allergies were reviewed. The patient's tolerance of                            previous anesthesia was also reviewed. The risks                            and benefits of the procedure and the sedation                            options and risks were discussed with the patient.                            All questions were answered, and informed consent                            was obtained. Prior Anticoagulants: The patient has  taken no anticoagulant or antiplatelet agents. ASA                            Grade Assessment: III - A patient with severe                            systemic disease. After reviewing the risks and                            benefits, the patient was deemed in satisfactory                            condition to undergo the procedure.                           After  obtaining informed consent, the endoscope was                            passed under direct vision. Throughout the                            procedure, the patient's blood pressure, pulse, and                            oxygen saturations were monitored continuously. The                            [id number]-H190 ([id number]) Olympus endoscope was introduced                            through the mouth, and advanced to the second part                            of duodenum. The upper GI endoscopy was                            accomplished without difficulty. The patient                            tolerated the procedure well. Scope In: Scope Out: Findings:      LA Grade D (one or more mucosal breaks involving at least 75% of       esophageal circumference) esophagitis was found in the lower third of       the esophagus. Biopsies were taken with a cold forceps for histology.      A 7 cm hiatal hernia was present.      The gastroesophageal flap valve was visualized endoscopically and       classified as Hill Grade IV (no fold, wide open lumen, hiatal hernia       present).      The entire examined stomach was normal.      A single 20 mm submucosal nodule was found in the second portion of the       duodenum. Biopsies (bite on bite) were taken with a cold forceps for  histology. This lesion is soft, query lipoma.      The exam of the duodenum was otherwise normal. Impression:               - LA Grade D reflux esophagitis. Biopsied.                           - 7 cm hiatal hernia.                           - Normal stomach.                           - Submucosal nodule found in the 2nd portion of the                            duodenum. Biopsied. Moderate Sedation:      N/A Recommendation:           - Patient has a contact number available for                            emergencies. The signs and symptoms of potential                            delayed complications were discussed with the                             patient. Return to normal activities tomorrow.                            Written discharge instructions were provided to the                            patient.                           - Resume previous diet.                           - Continue present medications. Continue omeprazole                            40 mg BID-AC (open capsules and administer with                            applesauce). Reflux precautions. Complete course of                            fluconazole as ordered at office visit 04/01/23).                           - Await pathology results.                           - Office follow-up with [name], PAC in 8-12  weeks. Procedure Code(s):        --- Professional ---                           [phone], Esophagogastroduodenoscopy, flexible,                            transoral; with biopsy, single or multiple Diagnosis Code(s):        --- Professional ---                           K21.00, Gastro-esophageal reflux disease with                            esophagitis, without bleeding                           K44.9, Diaphragmatic hernia without obstruction or                            gangrene                           K31.89, Other diseases of stomach and duodenum                           R13.10, Dysphagia, unspecified                           R63.4, Abnormal weight loss CPT copyright 2022 American [employer]. All rights reserved. The codes documented in this report are preliminary and upon coder review may  be revised to meet current compliance requirements. Beverley Fiedler, MD 04/07/2023 10:18:02 AM This report has been signed electronically. Number of Addenda: 0

## 2023-04-07 NOTE — Anesthesia Preprocedure Evaluation (Addendum)
Anesthesia Evaluation  Patient identified by MRN, date of birth, ID band Patient awake    Reviewed: Allergy & Precautions, NPO status , Patient's Chart, lab work & pertinent test results, reviewed documented beta blocker date and time   History of Anesthesia Complications (+) PONV, DIFFICULT AIRWAY and history of anesthetic complications  Airway Mallampati: III  TM Distance: >3 FB Neck ROM: Full    Dental  (+) Teeth Intact, Dental Advisory Given   Pulmonary COPD,  COPD inhaler, former smoker Quit smoking 2013, 45 pack year history    Pulmonary exam normal breath sounds clear to auscultation       Cardiovascular hypertension, Pt. on medications and Pt. on home beta blockers Normal cardiovascular exam+ dysrhythmias Atrial Fibrillation  Rhythm:Regular Rate:Normal     Neuro/Psych  Headaches PSYCHIATRIC DISORDERS Anxiety Depression       GI/Hepatic hiatal hernia,GERD  Medicated and Controlled,,(+)     substance abuse  marijuana useEsophageal nodule   Endo/Other  Hypothyroidism    Renal/GU negative Renal ROS  negative genitourinary   Musculoskeletal  (+) Arthritis , Osteoarthritis,    Abdominal   Peds  Hematology  (+) Blood dyscrasia, anemia Hb 11.3, plt 544   Anesthesia Other Findings   Reproductive/Obstetrics negative OB ROS                             Anesthesia Physical Anesthesia Plan  ASA: 3  Anesthesia Plan: MAC   Post-op Pain Management:    Induction:   PONV Risk Score and Plan: 2 and Propofol infusion and TIVA  Airway Management Planned: Natural Airway and Simple Face Mask  Additional Equipment: None  Intra-op Plan:   Post-operative Plan:   Informed Consent: I have reviewed the patients History and Physical, chart, labs and discussed the procedure including the risks, benefits and alternatives for the proposed anesthesia with the patient or authorized representative  who has indicated his/her understanding and acceptance.       Plan Discussed with: CRNA  Anesthesia Plan Comments:        Anesthesia Quick Evaluation

## 2023-04-07 NOTE — Anesthesia Postprocedure Evaluation (Signed)
Anesthesia Post Note  Patient: ALIVIAH BOWRING  Procedure(s) Performed: ESOPHAGOGASTRODUODENOSCOPY (EGD) WITH PROPOFOL BIOPSY     Patient location during evaluation: PACU Anesthesia Type: MAC Level of consciousness: awake and alert Pain management: pain level controlled Vital Signs Assessment: post-procedure vital signs reviewed and stable Respiratory status: spontaneous breathing, nonlabored ventilation and respiratory function stable Cardiovascular status: blood pressure returned to baseline and stable Postop Assessment: no apparent nausea or vomiting Anesthetic complications: no   No notable events documented.  Last Vitals:  Vitals:   04/07/23 1023 04/07/23 1031  BP: (!) 94/51 98/62  Pulse: 71 68  Resp: 18 (!) 21  Temp:    SpO2: 98% 98%    Last Pain:  Vitals:   04/07/23 1031  TempSrc:   PainSc: 0-No pain                 Lannie Fields

## 2023-04-07 NOTE — Discharge Instructions (Addendum)
YOU HAD AN ENDOSCOPIC PROCEDURE TODAY: Refer to the procedure report and other information in the discharge instructions given to you for any specific questions about what was found during the examination. If this information does not answer your questions, please call [place] office at [phone] to clarify.   YOU SHOULD EXPECT: Some feelings of bloating in the abdomen. Passage of more gas than usual. Walking can help get rid of the air that was put into your GI tract during the procedure and reduce the bloating. If you had a lower endoscopy (such as a colonoscopy or flexible sigmoidoscopy) you may notice spotting of blood in your stool or on the toilet paper. Some abdominal soreness may be present for a day or two, also.  DIET: Your first meal following the procedure should be a light meal and then it is ok to progress to your normal diet. A half-sandwich or bowl of soup is an example of a good first meal. Heavy or fried foods are harder to digest and may make you feel nauseous or bloated. Drink plenty of fluids but you should avoid alcoholic beverages for 24 hours. If you had a esophageal dilation, please see attached instructions for diet.    ACTIVITY: Your care partner should take you home directly after the procedure. You should plan to take it easy, moving slowly for the rest of the day. You can resume normal activity the day after the procedure however YOU SHOULD NOT DRIVE, use power tools, machinery or perform tasks that involve climbing or major physical exertion for 24 hours (because of the sedation medicines used during the test).   SYMPTOMS TO REPORT IMMEDIATELY: A gastroenterologist can be reached at any hour. Please call [phone]  for any of the following symptoms:  Following upper endoscopy (EGD, EUS, ERCP, esophageal dilation) Vomiting of blood or coffee ground material  New, significant abdominal pain  New, significant chest pain or pain under the shoulder blades  Painful or  persistently difficult swallowing  New shortness of breath  Black, tarry-looking or red, bloody stools  FOLLOW UP:  If any biopsies were taken you will be contacted by phone or by letter within the next 1-3 weeks. Call [phone]  if you have not heard about the biopsies in 3 weeks.  Please also call with any specific questions about appointments or follow up tests.

## 2023-04-07 NOTE — Transfer of Care (Signed)
Immediate Anesthesia Transfer of Care Note  Patient: Andrea Morgan  Procedure(s) Performed: ESOPHAGOGASTRODUODENOSCOPY (EGD) WITH PROPOFOL BIOPSY  Patient Location: PACU  Anesthesia Type:MAC  Level of Consciousness: sedated  Airway & Oxygen Therapy: Patient Spontanous Breathing  Post-op Assessment: Report given to RN  Post vital signs: Reviewed and stable  Last Vitals:  Vitals Value Taken Time  BP 107/56 04/07/23 1013  Temp 36.4 C 04/07/23 1010  Pulse 60 04/07/23 1013  Resp 18 04/07/23 1013  SpO2 100 % 04/07/23 1013  Vitals shown include unfiled device data.  Last Pain:  Vitals:   04/07/23 1010  TempSrc: Temporal  PainSc: Asleep         Complications: No notable events documented.

## 2023-04-08 ENCOUNTER — Encounter (HOSPITAL_COMMUNITY): Payer: Self-pay | Admitting: Internal Medicine

## 2023-04-12 LAB — SURGICAL PATHOLOGY

## 2023-04-18 ENCOUNTER — Encounter: Payer: Self-pay | Admitting: Internal Medicine

## 2023-05-03 ENCOUNTER — Inpatient Hospital Stay: Payer: 59 | Attending: Internal Medicine

## 2023-05-03 ENCOUNTER — Ambulatory Visit (HOSPITAL_COMMUNITY): Payer: 59

## 2023-05-04 ENCOUNTER — Encounter: Payer: Self-pay | Admitting: Internal Medicine

## 2023-05-05 ENCOUNTER — Encounter: Payer: Self-pay | Admitting: Internal Medicine

## 2023-05-09 ENCOUNTER — Telehealth: Payer: Self-pay

## 2023-05-09 NOTE — Telephone Encounter (Signed)
Patient was at [facility] in [place], [place].  Contacted them at [phone] and she is no longer a patient there. Tried to reach patient at [phone] and was unable to leave a VM.  Contacted Debbie and LVM for return call. Patient has an appt with Dr. Arbutus Ped 1/21 and a scan on 1/22.  Patients appt with Dr. Arbutus Ped will be canceled and rescheduled til after the scan.  LVM with Debbie to return call so patient can be rescheduled.

## 2023-05-10 ENCOUNTER — Encounter: Payer: Self-pay | Admitting: Internal Medicine

## 2023-05-10 ENCOUNTER — Inpatient Hospital Stay: Payer: 59 | Admitting: Internal Medicine

## 2023-05-10 NOTE — Telephone Encounter (Signed)
Spoke with patients friend Eunice Blase.  Eunice Blase stated that patient does not have a cell phone at the moment to call her with appts. Patient is scheduled for labs before CT tomorrow and a f/u appt with Dr. Arbutus Ped 1/29 @ 1015. If any issues, informed Debbie to give [place] a call.

## 2023-05-11 ENCOUNTER — Other Ambulatory Visit: Payer: 59

## 2023-05-11 ENCOUNTER — Ambulatory Visit (HOSPITAL_COMMUNITY): Payer: 59

## 2023-05-12 ENCOUNTER — Encounter (HOSPITAL_COMMUNITY): Admission: RE | Payer: Self-pay | Source: Ambulatory Visit

## 2023-05-12 ENCOUNTER — Other Ambulatory Visit: Payer: Self-pay

## 2023-05-12 ENCOUNTER — Telehealth: Payer: Self-pay

## 2023-05-12 ENCOUNTER — Ambulatory Visit (HOSPITAL_COMMUNITY): Admission: RE | Admit: 2023-05-12 | Payer: 59 | Source: Ambulatory Visit | Admitting: Internal Medicine

## 2023-05-12 DIAGNOSIS — C3491 Malignant neoplasm of unspecified part of right bronchus or lung: Secondary | ICD-10-CM

## 2023-05-12 SURGERY — COLONOSCOPY WITH PROPOFOL
Anesthesia: Monitor Anesthesia Care

## 2023-05-12 NOTE — Telephone Encounter (Signed)
Spoke with Eunice Blase about patients missed CT scan yesterday.  Eunice Blase stated that patient cannot drive, doesn't have a car and no phone.  Informed Debbie I would place a referral to social work for transportation needs.  She voiced thanks. Sending email to [place] about transportation for the patient. Scheduling CT scan and appt with Dr. Arbutus Ped for a couple weeks out so transportation can be arranged. Appointments are made. Email sent to [place] and told to contact our office with any issues. LVM with Debbie about scheduled appts.

## 2023-05-13 ENCOUNTER — Telehealth: Payer: Self-pay

## 2023-05-13 ENCOUNTER — Other Ambulatory Visit: Payer: Self-pay

## 2023-05-13 NOTE — Telephone Encounter (Signed)
-----   Message from Nurse Kyra Searles sent at 05/11/2023  7:57 AM EST ----- Regarding: pt unaware of colon tomorrow Hey, Ms Cormier was scheduled for a colonoscopy tomorrow. She evidently doesn't have a phone right now, so I had to call her friend Eunice Blase who said that the patient knew nothing about the procedure tomorrow and would need to reschedule. She asked if someone would call her Eunice Blase) to get it rescheduled.  Thanks!

## 2023-05-13 NOTE — Telephone Encounter (Signed)
Colon rescheduled at [facility] 07/25/23 at 8:30am,  pt to arrive there at 7am. Case#[id number]. Left message for Eunice Blase to call back to let us know if this appt will work.

## 2023-05-14 ENCOUNTER — Other Ambulatory Visit: Payer: Self-pay

## 2023-05-18 ENCOUNTER — Inpatient Hospital Stay: Payer: 59 | Admitting: Internal Medicine

## 2023-05-18 NOTE — Telephone Encounter (Signed)
Called and left another message with the appt date and time with Debbie. Prep instructions mailed to pt.

## 2023-05-23 NOTE — Telephone Encounter (Signed)
Debbie's husband called stating they do not wish to be in patient's DPR.

## 2023-05-23 NOTE — Telephone Encounter (Signed)
Attempted to call pt. No answer, call does not go through. Colon appt and instructions were mailed to pt.   Note sent to Dr. Pollie Friar nurse to see if they can let the pt know about the appt as pts phone is not working and the couple listed on her DPR called today and wish to no longer be listed on her DPR. Pt has been scheduled for a colonoscopy. We have mailed out appt and prep instructions to pt but have not been able to speak to her about the appt. I see where she has some upcoming appts at the cancer center and was hoping you could help notify her.  Thanks, Selinda Michaels RN

## 2023-05-25 ENCOUNTER — Other Ambulatory Visit: Payer: Self-pay | Admitting: *Deleted

## 2023-05-25 DIAGNOSIS — C3491 Malignant neoplasm of unspecified part of right bronchus or lung: Secondary | ICD-10-CM

## 2023-05-26 ENCOUNTER — Ambulatory Visit (HOSPITAL_COMMUNITY): Payer: 59 | Attending: Physician Assistant

## 2023-05-26 ENCOUNTER — Inpatient Hospital Stay: Payer: 59 | Attending: Internal Medicine

## 2023-05-30 ENCOUNTER — Telehealth: Payer: Self-pay | Admitting: *Deleted

## 2023-05-30 NOTE — Telephone Encounter (Signed)
 Called to f/u with pt sister on missed appts. Per pt sister, there are legal issues that are surrounding care for pt and pt no longer has a working number. Advise her, if she is able to contact pt, please have them to call office and reschedule missed scans and appts for continuation of care. Pt sister verbalized understanding.

## 2023-06-02 ENCOUNTER — Inpatient Hospital Stay: Payer: 59 | Admitting: Internal Medicine

## 2023-06-23 ENCOUNTER — Encounter: Payer: 59 | Admitting: Internal Medicine

## 2023-06-26 ENCOUNTER — Other Ambulatory Visit: Payer: Self-pay | Admitting: Physician Assistant

## 2023-06-29 ENCOUNTER — Other Ambulatory Visit: Payer: Self-pay | Admitting: Internal Medicine

## 2023-06-29 DIAGNOSIS — C3491 Malignant neoplasm of unspecified part of right bronchus or lung: Secondary | ICD-10-CM

## 2023-06-30 ENCOUNTER — Other Ambulatory Visit: Payer: Self-pay | Admitting: Internal Medicine

## 2023-06-30 DIAGNOSIS — C3491 Malignant neoplasm of unspecified part of right bronchus or lung: Secondary | ICD-10-CM

## 2023-07-18 ENCOUNTER — Encounter (HOSPITAL_COMMUNITY): Payer: Self-pay | Admitting: Internal Medicine

## 2023-07-18 NOTE — Telephone Encounter (Signed)
 Marland Kitchen  ho

## 2023-07-19 ENCOUNTER — Telehealth: Payer: Self-pay

## 2023-07-20 NOTE — Telephone Encounter (Signed)
 Procedure:Endo Procedure date: 07/25/23 Procedure location: wl Arrival Time: 7am Spoke with the patient Y/N: n Call her number no longer works.07/18/23. I then call the second number her friend asked to be removed from her contact list. 07/19/23 @ 4:30 pm I call her sister and left a detail message Any prep concerns? Marland KitchenMarland Kitchen  Has the patient obtained the prep from the pharmacy ? Marland KitchenMarland Kitchen Do you have a care partner and transportation: .Marland Kitchen Any additional concerns? Marland KitchenMarland Kitchen

## 2023-07-25 ENCOUNTER — Encounter (HOSPITAL_COMMUNITY): Admission: RE | Payer: Self-pay | Source: Ambulatory Visit

## 2023-07-25 ENCOUNTER — Ambulatory Visit (HOSPITAL_COMMUNITY): Admission: RE | Admit: 2023-07-25 | Payer: 59 | Source: Ambulatory Visit | Admitting: Internal Medicine

## 2023-07-25 SURGERY — COLONOSCOPY WITH PROPOFOL
Anesthesia: Monitor Anesthesia Care

## 2023-11-18 DEATH — deceased

## 2024-01-31 ENCOUNTER — Encounter: Payer: Self-pay | Admitting: *Deleted
